# Patient Record
Sex: Male | Born: 1940 | Race: White | Hispanic: No | Smoking: Former smoker
Health system: Southern US, Community
[De-identification: ages and names within clinical notes are randomized; demographics above are authoritative.]

## PROBLEM LIST (undated history)

## (undated) ENCOUNTER — Encounter

## (undated) ENCOUNTER — Telehealth

## (undated) ENCOUNTER — Encounter: Attending: Hematology & Oncology | Primary: Hematology & Oncology

## (undated) ENCOUNTER — Ambulatory Visit

## (undated) ENCOUNTER — Telehealth: Attending: Hematology & Oncology | Primary: Hematology & Oncology

## (undated) ENCOUNTER — Ambulatory Visit: Payer: MEDICARE

## (undated) ENCOUNTER — Encounter: Attending: Registered" | Primary: Registered"

## (undated) ENCOUNTER — Ambulatory Visit: Payer: MEDICARE | Attending: Hematology & Oncology | Primary: Hematology & Oncology

## (undated) ENCOUNTER — Encounter: Attending: Adult Health | Primary: Adult Health

## (undated) ENCOUNTER — Ambulatory Visit: Payer: MEDICARE | Attending: Internal Medicine | Primary: Internal Medicine

## (undated) ENCOUNTER — Ambulatory Visit
Payer: MEDICARE | Attending: Student in an Organized Health Care Education/Training Program | Primary: Student in an Organized Health Care Education/Training Program

## (undated) DIAGNOSIS — Z9289 Personal history of other medical treatment: Secondary | ICD-10-CM

## (undated) DIAGNOSIS — K644 Residual hemorrhoidal skin tags: Secondary | ICD-10-CM

## (undated) DIAGNOSIS — E785 Hyperlipidemia, unspecified: Secondary | ICD-10-CM

## (undated) DIAGNOSIS — M4712 Other spondylosis with myelopathy, cervical region: Secondary | ICD-10-CM

## (undated) DIAGNOSIS — I4510 Unspecified right bundle-branch block: Secondary | ICD-10-CM

## (undated) DIAGNOSIS — K5790 Diverticulosis of intestine, part unspecified, without perforation or abscess without bleeding: Secondary | ICD-10-CM

## (undated) DIAGNOSIS — K219 Gastro-esophageal reflux disease without esophagitis: Secondary | ICD-10-CM

## (undated) DIAGNOSIS — I5030 Unspecified diastolic (congestive) heart failure: Secondary | ICD-10-CM

## (undated) DIAGNOSIS — F4310 Post-traumatic stress disorder, unspecified: Secondary | ICD-10-CM

## (undated) DIAGNOSIS — Z7901 Long term (current) use of anticoagulants: Secondary | ICD-10-CM

## (undated) DIAGNOSIS — R1319 Other dysphagia: Secondary | ICD-10-CM

## (undated) DIAGNOSIS — K56609 Unspecified intestinal obstruction, unspecified as to partial versus complete obstruction: Secondary | ICD-10-CM

## (undated) DIAGNOSIS — G4733 Obstructive sleep apnea (adult) (pediatric): Secondary | ICD-10-CM

## (undated) DIAGNOSIS — K922 Gastrointestinal hemorrhage, unspecified: Secondary | ICD-10-CM

## (undated) DIAGNOSIS — H53001 Unspecified amblyopia, right eye: Secondary | ICD-10-CM

## (undated) DIAGNOSIS — I480 Paroxysmal atrial fibrillation: Secondary | ICD-10-CM

## (undated) DIAGNOSIS — Z87442 Personal history of urinary calculi: Secondary | ICD-10-CM

## (undated) DIAGNOSIS — I10 Essential (primary) hypertension: Secondary | ICD-10-CM

## (undated) DIAGNOSIS — G629 Polyneuropathy, unspecified: Secondary | ICD-10-CM

## (undated) DIAGNOSIS — D494 Neoplasm of unspecified behavior of bladder: Secondary | ICD-10-CM

## (undated) DIAGNOSIS — F419 Anxiety disorder, unspecified: Secondary | ICD-10-CM

## (undated) DIAGNOSIS — C801 Malignant (primary) neoplasm, unspecified: Secondary | ICD-10-CM

## (undated) DIAGNOSIS — Z9989 Dependence on other enabling machines and devices: Secondary | ICD-10-CM

## (undated) DIAGNOSIS — E538 Deficiency of other specified B group vitamins: Secondary | ICD-10-CM

## (undated) DIAGNOSIS — M5136 Other intervertebral disc degeneration, lumbar region: Secondary | ICD-10-CM

## (undated) DIAGNOSIS — M8580 Other specified disorders of bone density and structure, unspecified site: Secondary | ICD-10-CM

## (undated) DIAGNOSIS — S83209A Unspecified tear of unspecified meniscus, current injury, unspecified knee, initial encounter: Secondary | ICD-10-CM

## (undated) DIAGNOSIS — C4440 Unspecified malignant neoplasm of skin of scalp and neck: Secondary | ICD-10-CM

## (undated) DIAGNOSIS — Q283 Other malformations of cerebral vessels: Secondary | ICD-10-CM

## (undated) DIAGNOSIS — Z8719 Personal history of other diseases of the digestive system: Secondary | ICD-10-CM

## (undated) DIAGNOSIS — M1990 Unspecified osteoarthritis, unspecified site: Secondary | ICD-10-CM

## (undated) DIAGNOSIS — M51369 Other intervertebral disc degeneration, lumbar region without mention of lumbar back pain or lower extremity pain: Secondary | ICD-10-CM

## (undated) DIAGNOSIS — I700 Atherosclerosis of aorta: Secondary | ICD-10-CM

## (undated) DIAGNOSIS — F32A Depression, unspecified: Secondary | ICD-10-CM

## (undated) DIAGNOSIS — I4892 Unspecified atrial flutter: Secondary | ICD-10-CM

## (undated) DIAGNOSIS — M419 Scoliosis, unspecified: Secondary | ICD-10-CM

## (undated) DIAGNOSIS — N183 Chronic kidney disease, stage 3 unspecified: Secondary | ICD-10-CM

## (undated) HISTORY — DX: Unspecified tear of unspecified meniscus, current injury, unspecified knee, initial encounter: S83.209A

## (undated) HISTORY — DX: Gastrointestinal hemorrhage, unspecified: K92.2

## (undated) HISTORY — DX: Dependence on other enabling machines and devices: Z99.89

## (undated) HISTORY — DX: Unspecified osteoarthritis, unspecified site: M19.90

## (undated) HISTORY — DX: Personal history of other medical treatment: Z92.89

## (undated) HISTORY — DX: Unspecified atrial flutter: I48.92

## (undated) HISTORY — DX: Malignant (primary) neoplasm, unspecified: C80.1

## (undated) HISTORY — DX: Paroxysmal atrial fibrillation: I48.0

## (undated) HISTORY — DX: Obstructive sleep apnea (adult) (pediatric): G47.33

## (undated) HISTORY — DX: Atherosclerosis of aorta: I70.0

## (undated) HISTORY — DX: Polyneuropathy, unspecified: G62.9

## (undated) HISTORY — DX: Hyperlipidemia, unspecified: E78.5

## (undated) HISTORY — PX: MOHS SURGERY: SUR867

---
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
## 1964-05-15 HISTORY — PX: SKIN GRAFT FULL THICKNESS ARM: SUR1297

## 1998-05-15 HISTORY — PX: LASIK: SHX215

## 2006-05-15 HISTORY — PX: MENISCUS REPAIR: SHX5179

## 2007-01-28 ENCOUNTER — Ambulatory Visit: Payer: Self-pay | Admitting: General Practice

## 2007-06-20 ENCOUNTER — Ambulatory Visit: Payer: Self-pay | Admitting: General Practice

## 2007-07-05 ENCOUNTER — Ambulatory Visit: Payer: Self-pay | Admitting: General Practice

## 2009-05-15 DIAGNOSIS — C61 Malignant neoplasm of prostate: Secondary | ICD-10-CM

## 2009-05-15 DIAGNOSIS — C801 Malignant (primary) neoplasm, unspecified: Secondary | ICD-10-CM

## 2009-05-15 HISTORY — DX: Malignant neoplasm of prostate: C61

## 2009-05-15 HISTORY — DX: Malignant (primary) neoplasm, unspecified: C80.1

## 2009-11-16 ENCOUNTER — Ambulatory Visit: Payer: Self-pay | Admitting: Urology

## 2010-09-12 ENCOUNTER — Ambulatory Visit: Payer: Self-pay | Admitting: Gastroenterology

## 2010-09-12 HISTORY — PX: COLONOSCOPY WITH ESOPHAGOGASTRODUODENOSCOPY (EGD): SHX5779

## 2010-12-30 ENCOUNTER — Ambulatory Visit: Payer: Self-pay | Admitting: Internal Medicine

## 2011-01-10 ENCOUNTER — Ambulatory Visit: Payer: Self-pay | Admitting: Internal Medicine

## 2011-09-21 ENCOUNTER — Inpatient Hospital Stay: Payer: Self-pay | Admitting: Internal Medicine

## 2011-09-21 LAB — PROTIME-INR
INR: 0.9
Prothrombin Time: 12.7 secs (ref 11.5–14.7)

## 2011-09-21 LAB — APTT: Activated PTT: 29.8 secs (ref 23.6–35.9)

## 2011-09-22 LAB — BASIC METABOLIC PANEL
Chloride: 109 mmol/L — ABNORMAL HIGH (ref 98–107)
Co2: 28 mmol/L (ref 21–32)
Creatinine: 0.65 mg/dL (ref 0.60–1.30)
EGFR (African American): >60
EGFR (Non-African Amer.): >60
Glucose: 94 mg/dL (ref 65–99)
Osmolality: 283 (ref 275–301)
Potassium: 3.9 mmol/L (ref 3.5–5.1)
Sodium: 143 mmol/L (ref 136–145)

## 2011-09-22 LAB — CBC WITH DIFFERENTIAL/PLATELET
Basophil #: 0 10*3/uL (ref 0.0–0.1)
Basophil %: 0.9 %
HCT: 29 % — ABNORMAL LOW (ref 40.0–52.0)
Lymphocyte %: 30.8 %
Neutrophil #: 2 10*3/uL (ref 1.4–6.5)
Platelet: 166 10*3/uL (ref 150–440)
RDW: 13.1 % (ref 11.5–14.5)

## 2011-09-23 LAB — CBC WITH DIFFERENTIAL/PLATELET
Basophil #: 0 10*3/uL (ref 0.0–0.1)
Eosinophil %: 4.3 %
HCT: 30.3 % — ABNORMAL LOW (ref 40.0–52.0)
Lymphocyte #: 1.2 10*3/uL (ref 1.0–3.6)
Lymphocyte %: 38 %
MCH: 33.5 pg (ref 26.0–34.0)
MCHC: 34 g/dL (ref 32.0–36.0)
Neutrophil #: 1.5 10*3/uL (ref 1.4–6.5)
Neutrophil %: 47.4 %
Platelet: 186 10*3/uL (ref 150–440)
RBC: 3.08 10*6/uL — ABNORMAL LOW (ref 4.40–5.90)
WBC: 3.2 10*3/uL — ABNORMAL LOW (ref 3.8–10.6)

## 2011-09-24 LAB — HEMOGLOBIN: HGB: 10.2 g/dL — ABNORMAL LOW (ref 13.0–18.0)

## 2012-01-22 ENCOUNTER — Ambulatory Visit: Payer: Self-pay | Admitting: Gastroenterology

## 2012-02-06 DIAGNOSIS — H2510 Age-related nuclear cataract, unspecified eye: Secondary | ICD-10-CM | POA: Insufficient documentation

## 2012-02-06 DIAGNOSIS — H53001 Unspecified amblyopia, right eye: Secondary | ICD-10-CM | POA: Insufficient documentation

## 2012-02-19 DIAGNOSIS — N529 Male erectile dysfunction, unspecified: Secondary | ICD-10-CM | POA: Insufficient documentation

## 2012-03-12 DIAGNOSIS — H5213 Myopia, bilateral: Secondary | ICD-10-CM | POA: Insufficient documentation

## 2012-03-12 DIAGNOSIS — H5021 Vertical strabismus, right eye: Secondary | ICD-10-CM | POA: Insufficient documentation

## 2012-03-12 DIAGNOSIS — H5000 Unspecified esotropia: Secondary | ICD-10-CM | POA: Insufficient documentation

## 2012-06-11 ENCOUNTER — Ambulatory Visit: Payer: Self-pay | Admitting: Internal Medicine

## 2013-05-15 HISTORY — PX: VASCULAR SURGERY: SHX849

## 2013-06-05 LAB — COMPREHENSIVE METABOLIC PANEL
ALK PHOS: 87 U/L
Albumin: 4 g/dL (ref 3.4–5.0)
Anion Gap: 3 — ABNORMAL LOW (ref 7–16)
BILIRUBIN TOTAL: 0.7 mg/dL (ref 0.2–1.0)
BUN: 18 mg/dL (ref 7–18)
CO2: 29 mmol/L (ref 21–32)
CREATININE: 0.77 mg/dL (ref 0.60–1.30)
Calcium, Total: 9.1 mg/dL (ref 8.5–10.1)
Chloride: 102 mmol/L (ref 98–107)
EGFR (African American): >60
EGFR (Non-African Amer.): >60
Glucose: 97 mg/dL (ref 65–99)
Osmolality: 270 (ref 275–301)
Potassium: 4 mmol/L (ref 3.5–5.1)
SGOT(AST): 27 U/L (ref 15–37)
SGPT (ALT): 19 U/L (ref 12–78)
Sodium: 134 mmol/L — ABNORMAL LOW (ref 136–145)
TOTAL PROTEIN: 7.5 g/dL (ref 6.4–8.2)

## 2013-06-05 LAB — CBC WITH DIFFERENTIAL/PLATELET
BASOS ABS: 0 10*3/uL (ref 0.0–0.1)
BASOS PCT: 0.5 %
EOS PCT: 1.1 %
Eosinophil #: 0.1 10*3/uL (ref 0.0–0.7)
HCT: 39.3 % — ABNORMAL LOW (ref 40.0–52.0)
HGB: 13.3 g/dL (ref 13.0–18.0)
LYMPHS PCT: 23.3 %
Lymphocyte #: 1.2 10*3/uL (ref 1.0–3.6)
MCH: 32.8 pg (ref 26.0–34.0)
MCHC: 33.8 g/dL (ref 32.0–36.0)
MCV: 97 fL (ref 80–100)
Monocyte #: 0.4 x10 3/mm (ref 0.2–1.0)
Monocyte %: 8.3 %
Neutrophil #: 3.5 10*3/uL (ref 1.4–6.5)
Neutrophil %: 66.8 %
Platelet: 205 10*3/uL (ref 150–440)
RBC: 4.04 10*6/uL — AB (ref 4.40–5.90)
RDW: 12.7 % (ref 11.5–14.5)
WBC: 5.2 10*3/uL (ref 3.8–10.6)

## 2013-06-05 LAB — CBC
HCT: 40.2 % (ref 40.0–52.0)
HGB: 13.6 g/dL (ref 13.0–18.0)
MCH: 32.8 pg (ref 26.0–34.0)
MCHC: 33.8 g/dL (ref 32.0–36.0)
MCV: 97 fL (ref 80–100)
PLATELETS: 221 10*3/uL (ref 150–440)
RBC: 4.15 10*6/uL — AB (ref 4.40–5.90)
RDW: 12.9 % (ref 11.5–14.5)
WBC: 6.7 10*3/uL (ref 3.8–10.6)

## 2013-06-06 ENCOUNTER — Inpatient Hospital Stay: Payer: Self-pay | Admitting: Internal Medicine

## 2013-06-06 LAB — HEMATOCRIT
HCT: 31.9 % — AB (ref 40.0–52.0)
HCT: 29.4 % — ABNORMAL LOW (ref 40.0–52.0)
HCT: 26.7 % — AB (ref 40.0–52.0)

## 2013-06-06 LAB — HEMOGLOBIN
HGB: 10.9 g/dL — ABNORMAL LOW (ref 13.0–18.0)
HGB: 9.9 g/dL — AB (ref 13.0–18.0)
HGB: 9 g/dL — ABNORMAL LOW (ref 13.0–18.0)

## 2013-06-07 LAB — BASIC METABOLIC PANEL
ANION GAP: 6 — AB (ref 7–16)
BUN: 13 mg/dL (ref 7–18)
CHLORIDE: 106 mmol/L (ref 98–107)
CREATININE: 0.75 mg/dL (ref 0.60–1.30)
Calcium, Total: 8 mg/dL — ABNORMAL LOW (ref 8.5–10.1)
Co2: 24 mmol/L (ref 21–32)
EGFR (African American): >60
Glucose: 112 mg/dL — ABNORMAL HIGH (ref 65–99)
OSMOLALITY: 273 (ref 275–301)
Potassium: 3.7 mmol/L (ref 3.5–5.1)
Sodium: 136 mmol/L (ref 136–145)

## 2013-06-07 LAB — CBC WITH DIFFERENTIAL/PLATELET
BASOS ABS: 0 10*3/uL (ref 0.0–0.1)
Basophil %: 0.4 %
EOS PCT: 0.2 %
Eosinophil #: 0 10*3/uL (ref 0.0–0.7)
HCT: 24.9 % — AB (ref 40.0–52.0)
HGB: 8.6 g/dL — ABNORMAL LOW (ref 13.0–18.0)
Lymphocyte #: 1.3 10*3/uL (ref 1.0–3.6)
Lymphocyte %: 16.4 %
MCH: 33.6 pg (ref 26.0–34.0)
MCHC: 34.7 g/dL (ref 32.0–36.0)
MCV: 97 fL (ref 80–100)
Monocyte #: 0.6 x10 3/mm (ref 0.2–1.0)
Monocyte %: 7.8 %
NEUTROS ABS: 5.8 10*3/uL (ref 1.4–6.5)
NEUTROS PCT: 75.2 %
Platelet: 192 10*3/uL (ref 150–440)
RBC: 2.57 10*6/uL — ABNORMAL LOW (ref 4.40–5.90)
RDW: 12.7 % (ref 11.5–14.5)
WBC: 7.7 10*3/uL (ref 3.8–10.6)

## 2013-06-07 LAB — HEMOGLOBIN: HGB: 8 g/dL — ABNORMAL LOW (ref 13.0–18.0)

## 2013-06-07 LAB — MAGNESIUM: Magnesium: 1.6 mg/dL — ABNORMAL LOW

## 2013-06-07 LAB — HEMATOCRIT: HCT: 23.3 % — AB (ref 40.0–52.0)

## 2013-06-08 LAB — BASIC METABOLIC PANEL
ANION GAP: 5 — AB (ref 7–16)
BUN: 11 mg/dL (ref 7–18)
Calcium, Total: 8 mg/dL — ABNORMAL LOW (ref 8.5–10.1)
Chloride: 108 mmol/L — ABNORMAL HIGH (ref 98–107)
Co2: 26 mmol/L (ref 21–32)
Creatinine: 0.73 mg/dL (ref 0.60–1.30)
EGFR (African American): >60
EGFR (Non-African Amer.): >60
Glucose: 97 mg/dL (ref 65–99)
OSMOLALITY: 277 (ref 275–301)
Potassium: 3.7 mmol/L (ref 3.5–5.1)
SODIUM: 139 mmol/L (ref 136–145)

## 2013-06-08 LAB — CBC WITH DIFFERENTIAL/PLATELET
BASOS PCT: 0.4 %
Basophil #: 0 10*3/uL (ref 0.0–0.1)
EOS ABS: 0.1 10*3/uL (ref 0.0–0.7)
Eosinophil %: 1.3 %
HCT: 20.9 % — ABNORMAL LOW (ref 40.0–52.0)
HGB: 7.1 g/dL — AB (ref 13.0–18.0)
Lymphocyte #: 1.3 10*3/uL (ref 1.0–3.6)
Lymphocyte %: 16.5 %
MCH: 32.8 pg (ref 26.0–34.0)
MCHC: 34.1 g/dL (ref 32.0–36.0)
MCV: 96 fL (ref 80–100)
MONOS PCT: 9.2 %
Monocyte #: 0.7 x10 3/mm (ref 0.2–1.0)
Neutrophil #: 5.6 10*3/uL (ref 1.4–6.5)
Neutrophil %: 72.6 %
PLATELETS: 183 10*3/uL (ref 150–440)
RBC: 2.17 10*6/uL — AB (ref 4.40–5.90)
RDW: 12.7 % (ref 11.5–14.5)
WBC: 7.7 10*3/uL (ref 3.8–10.6)

## 2013-06-09 LAB — CBC WITH DIFFERENTIAL/PLATELET
BASOS ABS: 0.1 10*3/uL (ref 0.0–0.1)
BASOS PCT: 0.5 %
EOS ABS: 0.1 10*3/uL (ref 0.0–0.7)
EOS PCT: 1.4 %
HCT: 23.9 % — ABNORMAL LOW (ref 40.0–52.0)
HGB: 8.2 g/dL — AB (ref 13.0–18.0)
LYMPHS PCT: 12.7 %
Lymphocyte #: 1.3 10*3/uL (ref 1.0–3.6)
MCH: 33.4 pg (ref 26.0–34.0)
MCHC: 34.2 g/dL (ref 32.0–36.0)
MCV: 98 fL (ref 80–100)
MONO ABS: 0.8 x10 3/mm (ref 0.2–1.0)
MONOS PCT: 7.8 %
Neutrophil #: 8.1 10*3/uL — ABNORMAL HIGH (ref 1.4–6.5)
Neutrophil %: 77.6 %
Platelet: 232 10*3/uL (ref 150–440)
RBC: 2.44 10*6/uL — AB (ref 4.40–5.90)
RDW: 12.6 % (ref 11.5–14.5)
WBC: 10.4 10*3/uL (ref 3.8–10.6)

## 2013-06-10 LAB — HEMOGLOBIN
HGB: 7 g/dL — ABNORMAL LOW (ref 13.0–18.0)
HGB: 8.1 g/dL — AB (ref 13.0–18.0)

## 2014-01-20 DIAGNOSIS — K219 Gastro-esophageal reflux disease without esophagitis: Secondary | ICD-10-CM | POA: Insufficient documentation

## 2014-07-13 ENCOUNTER — Encounter: Payer: Self-pay | Admitting: *Deleted

## 2014-07-20 ENCOUNTER — Ambulatory Visit (INDEPENDENT_AMBULATORY_CARE_PROVIDER_SITE_OTHER): Payer: Medicare Other | Admitting: General Surgery

## 2014-07-20 ENCOUNTER — Encounter: Payer: Self-pay | Admitting: General Surgery

## 2014-07-20 VITALS — BP 140/80 | HR 78 | Resp 14 | Ht 72.0 in | Wt 181.0 lb

## 2014-07-20 DIAGNOSIS — K4090 Unilateral inguinal hernia, without obstruction or gangrene, not specified as recurrent: Secondary | ICD-10-CM | POA: Diagnosis not present

## 2014-07-20 NOTE — Progress Notes (Signed)
Patient ID: Ryan Allison, male   DOB: Jan 07, 1941, 74 y.o.   MRN: [id number]  Chief Complaint  Patient presents with  . Other    evaluation of enlarging left inguinal hernia    HPI Ryan Allison is a 74 y.o. male who presents for an evaluation of an enlarging left inguinal hernia. The patient states he noticed the hernia approximately 25- 30 years. He states it has given him problems off and on, but in January 2016 he started having significant pain and a bulge. The hernia was bigger and very sore. He rested for few hours and the hernia reduced.  He has had this only once. He states the area was sensitive to the touch.  He had an abdominal/pelvis CT scan on 06/06/13.   HPI  Past Medical History  Diagnosis Date  . Intestinal bleeding   . Meniscus tear   . Arthritis   . Prostate enlargement   . Cancer 2011    prostate    Past Surgical History  Procedure Laterality Date  . Meniscus repair  2008  . Vascular surgery  2015    Family History  Problem Relation Age of Onset  . Cancer Father   . Cancer Brother     Social History History  Substance Use Topics  . Smoking status: Former Research scientist (life sciences)  . Smokeless tobacco: Never Used  . Alcohol Use: No    Allergies  Allergen Reactions  . Celebrex [Celecoxib] Itching and Rash    Current Outpatient Prescriptions  Medication Sig Dispense Refill  . gabapentin (NEURONTIN) 600 MG tablet Take 600 mg by mouth 4 (four) times daily.    Marland Kitchen omeprazole (PRILOSEC) 40 MG capsule Take 40 mg by mouth daily.    . simvastatin (ZOCOR) 20 MG tablet Take 20 mg by mouth daily.    . tamsulosin (FLOMAX) 0.4 MG CAPS capsule Take 0.4 mg by mouth 2 (two) times daily.    . traMADol (ULTRAM) 50 MG tablet Take 50 mg by mouth every 6 (six) hours as needed.     No current facility-administered medications for this visit.    Review of Systems Review of Systems  Constitutional: Negative.   Respiratory: Negative.   Cardiovascular: Negative.    Gastrointestinal: Negative.     Blood pressure 140/80, pulse 78, resp. rate 14, height 6' (1.829 m), weight 181 lb (82.101 kg).  Physical Exam Physical Exam  Constitutional: He is oriented to person, place, and time. He appears well-developed and well-nourished.  Eyes: Conjunctivae are normal. No scleral icterus.  Neck: Neck supple. No thyromegaly present.  Cardiovascular: Normal rate, regular rhythm and normal heart sounds.   No murmur heard. Pulmonary/Chest: Effort normal and breath sounds normal.  Abdominal: Soft. Normal appearance and bowel sounds are normal. There is no hepatosplenomegaly. There is no tenderness. A hernia is present. Hernia confirmed positive in the left inguinal area (small to medium reducible hernia).  Lymphadenopathy:    He has no cervical adenopathy.  Neurological: He is alert and oriented to person, place, and time.  Skin: Skin is warm and dry.    Data Reviewed Prior CT abd/pelvis  From 1 yr ago. Labs from December,2015  Assessment    Left inguinal hernia. Symptomatic with one episode recently of incarceration.     Plan   Discussed hernia repair in full, risk of incarceration, procedures and risks and benefits. Patient has an upcoming trip to [place] planned. This patient was instructed to call the office when he wishes to arrange left  inguinal hernia repair.  Advised on what to do if he has another episode of incarceration.       Ryan Allison 07/20/2014, 11:01 AM

## 2014-07-20 NOTE — Patient Instructions (Addendum)
Open Hernia Repair Open hernia repair is surgery to fix a hernia. A hernia occurs when an internal organ or tissue pushes out through a weak spot in the abdominal wall muscles. Hernias commonly occur in the groin and around the navel. Most hernias tend to get worse over time. Surgery is often done to prevent the hernia from getting bigger, becoming uncomfortable, or becoming an emergency. Emergency surgery may be needed if abdominal contents get stuck in the opening (incarcerated hernia) or the blood supply gets cut off (strangulated hernia). In an open repair, a large cut (incision) is made in the abdomen to perform the surgery. LET [facility] CARE PROVIDER KNOW ABOUT:  Any allergies you have.  All medicines you are taking, including vitamins, herbs, eye drops, creams, and over-the-counter medicines.  Previous problems you or members of your family have had with the use of anesthetics.  Any blood disorders you have.  Previous surgeries you have had.  Medical conditions you have. RISKS AND COMPLICATIONS Generally, this is a safe procedure. However, as with any procedure, complications can occur. Possible complications include:  Infection.  Bleeding.  Nerve injury.  Chronic pain.  The hernia can come back.  Injury to the intestines. BEFORE THE PROCEDURE  Ask your health care provider about changing or stopping any regular medicines. Avoid taking aspirin or blood thinners as directed by your health care provider.  Do noteat or drink anything after midnight the night before surgery.  If you smoke, do not smoke for at least 2 weeks before your surgery.  Do not drink alcohol the day before your surgery.  Let your health care provider know if you develop a cold or any infection before your surgery.  Arrange for someone to drive you home after the procedure or after your hospital stay. Also arrange for someone to help you with activities during recovery. PROCEDURE   Small  monitors will be put on your body. They are used to check your heart, blood pressure, and oxygen level.   An IV access tube will be put into one of your veins. Medicine will be able to flow directly into your body through this IV tube.   You might be given a medicine to help you relax (sedative).   You will be given a medicine to make you sleep (general anesthetic). A breathing tube may be placed into your lungs during the procedure.  A cut (incision) is made over the hernia defect, and the contents are pushed back into the abdomen.  If the hernia is small, stitches may be used to bring the muscle edges back together.  Typically, a surgeon will place a mesh patch made of man-made material (synthetic) to cover the defect. The mesh is sewn to healthy muscle. This reduces the risk of the hernia coming back.  The tissue and skin over the hernia are then closed with stitches or staples.  If the hernia was large, a drain may be left in place to collect excess fluid where the hernia used to be.  Bandages (dressings) are used to cover the incision. AFTER THE PROCEDURE  You will be taken to a recovery area where your progress will be monitored.  If the hernia was small or in the groin (inguinal) region, you will likely be allowed to go home once you are awake, stable, and taking fluids well.  If the hernia was large, you may have to wait for your bowel function to return. You may need to stay in the hospital  for 2-3 days until you can eat and your pain is controlled. A drain may be left in place for 5-7 days. You will be taught how to care for the drain. Document Released: 10/25/2000 Document Revised: 02/19/2013 Document Reviewed: 12/11/2012 [facility] Patient Information 2015 [place], [place]. This information is not intended to replace advice given to you by your health care provider. Make sure you discuss any questions you have with your health care provider.  This patient was instructed to  call the office when he wishes to arrange surgery.

## 2014-09-03 ENCOUNTER — Telehealth: Payer: Self-pay

## 2014-09-03 NOTE — Telephone Encounter (Signed)
Patient called to schedule his inguinal hernia surgery. Patient is scheduled for surgery at [facility] on 09/23/14. He will pre admit at the hospital. He will have a pre op visit here with Dr Jamal Collin on 09/14/14 at 1:45 pm. Patient is aware of dates, time, and instructions.

## 2014-09-05 NOTE — Consult Note (Signed)
PATIENT NAME:  Ryan Allison, Ryan Allison MR#:  [id number] DATE OF BIRTH:  1940/12/21  DATE OF CONSULTATION:  06/06/2013  CONSULTING PHYSICIAN:  Payton Emerald, NP  Addendum. This is a continuation.  IMPRESSION: Lower gastrointestinal bleed, diverticular in nature, positive blood loss study, hypotension and positive orthostatic vital signs, anemia, personal history of depression, anxiety, personal history of prostate cancer status post radiation therapy.   PLAN: The patient's presentation was discussed with Dr. Verdie Shire, who also saw the patient. He has spoken with Dr. Candiss Norse about the patient being transferred to ICU. Order has been placed. STAT hemoglobin and hematocrit ordered. Type and cross for 2 units of blood, transfuse as necessary. Consultation has already been placed for vascular surgery to see the patient for consideration of embolization. N.p.o. status. Will continue to closely monitor the patient during his hospitalization.   These services provided by Payton Emerald, MS, APRN, [facility], FNP, under collaborative agreement with Dr. Verdie Shire.  ____________________________ Payton Emerald, NP dsh:lb D: 06/06/2013 11:47:39 ET T: 06/06/2013 12:14:17 ET JOB#: [id number]  cc: Payton Emerald, NP, <Dictator> Payton Emerald MD ELECTRONICALLY SIGNED 06/06/2013 16:51

## 2014-09-05 NOTE — Consult Note (Signed)
Brief Consult Note: Diagnosis: Lower GI bleed.  Diverticular in nature.  Positive blood loss study. Hypotension. Positive orthrostatic VS.  Anemia.  History of depression and anxiety.  History of prostate cancer s/p radiation therapy.   Consult note dictated.   Discussed with Attending MD.   Comments: Patient's presentation discussed with Dr. Verdie Shire who spoke with Dr. Candiss Norse about patient being transferred to ICU. STAT Hemoglobin and Hematocrit ordered.  Type and Cross for two units of blood.  Consult has been placed for vasscular surgery.  NPO status.  Electronic Signatures: Payton Emerald (NP)  (Signed 23-Jan-15 11:47)  Authored: Brief Consult Note   Last Updated: 23-Jan-15 11:47 by Payton Emerald (NP)

## 2014-09-05 NOTE — Consult Note (Signed)
Brief Consult Note: Diagnosis: GI Bleed.   Recommend to proceed with surgery or procedure.   Comments: Patient continues to have bleeding and he had a strongly + nuc scan  Recommend embolization of the hepatic flexure  risks and benefits discussed with the patient, all questions answered.  He agrees to proceed with angiography and intervention.  Electronic Signatures: Hortencia Pilar (MD)  (Signed 23-Jan-15 15:34)  Authored: Brief Consult Note   Last Updated: 23-Jan-15 15:34 by Hortencia Pilar (MD)
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
## 2014-09-05 NOTE — Discharge Summary (Signed)
PATIENT NAME:  Ryan Allison, Ryan Allison MR#:  [id number] DATE OF BIRTH:  [birth year]  DATE OF ADMISSION:  06/06/2013 DATE OF DISCHARGE:  06/10/2013  FINAL DIAGNOSES: 1. Diverticular lower gastrointestinal bleeding.  2. Acute blood loss anemia secondary to #1.  3. Hypertension.  4. Peripheral neuropathy.   PROCEDURE: Status post arteriogram with embolization.   HISTORY AND PHYSICAL: Please see dictated admission history and physical.    HOSPITAL COURSE: The patient was admitted with significant gastrointestinal bleeding, with acceleration of the bleeding and drop in blood pressure which required transfer to the Intensive Care Unit and required fluid resuscitation. He was evaluated by GI and subsequently by vascular surgery. He underwent arteriogram with embolization of an area within the right colon which was noted to be positive by bleeding scan. This procedure was immediately noted to be successful by lack of blush on the arteriogram. His hemoglobin settled down around _____ . He was monitored and had a bowel movement which did not show bright red blood. 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 He is tolerating a diet, felt ready to go home and so will be discharged home in stable condition with his physical activity to be up as tolerated. He will follow a soft diet for 4 to 5 days, then a low sodium diet. He will follow up in our office in the next 1 to 2 weeks and follow up with Dr. Gustavo Lah in 2 to 4 weeks. Plans were set up to have him have a CBC done on 06/11/2013.   DISCHARGE MEDICATIONS: 1. Simvastatin 20 mg p.o. at bedtime.  2. Omeprazole 20 mg p.o. daily.  3. Neurontin 600 mg p.o. 4 times a day.  4. Flomax 0.4 mg p.o. at bedtime.  5. Ambien 10 mg p.o. at bedtime as needed for sleep.  6. Ferrous sulfate 325 mg p.o. daily with food.   7. Colace 100 mg p.o. b.i.d.  8. MiraLAX 17 grams p.o. daily as needed for constipation.  9. Tramadol 50 mg p.o. 4 times a day as needed for pain.   The patient was given instructions to avoid aspirin, ibuprofen or other anti-inflammatories. He may use Tylenol as needed.    ____________________________ Adin Hector, MD bjk:sg D: 06/10/2013 14:08:03 ET T: 06/10/2013 14:13:44 ET JOB#: [id number]  cc: Adin Hector, MD, <Dictator> Ramonita Lab MD ELECTRONICALLY SIGNED 06/11/2013 6:43

## 2014-09-05 NOTE — Consult Note (Signed)
Pt seen and examined. Plase see Ryan Allison's notes. Rectal bleeding since Wed. Multiple bouts of bleeding so far today. Pt hypotensive, orthostatic, and pale. CT shows diffuse tics. Bleeding scan positve near hepatic flexure. Stat Hgb ordered. Blood T+C for possible transfusions. IV hydration. Urgent consult for vascular surgery. Pt to be transferred to ICU today for closer monitering. Thanks.  Electronic Signatures: Verdie Shire (MD)  (Signed on 23-Jan-15 12:02)  Authored  Last Updated: 23-Jan-15 12:02 by Verdie Shire (MD)

## 2014-09-05 NOTE — Op Note (Signed)
PATIENT NAME:  Ryan Allison, Ryan Allison MR#:  [id number] DATE OF BIRTH:  [birth year]  DATE OF PROCEDURE:  06/06/2013  PREOPERATIVE DIAGNOSIS: Gastrointestinal bleed.   POSTOPERATIVE DIAGNOSIS: Gastrointestinal bleed.   PROCEDURES PERFORMED:  1.  Abdominal aortogram.  2.  Selective injection of the middle colic artery.  3.  Selective injection of a branch of the right colic artery representing additional third order catheter placement.  4.  Bead embolization of the middle colic artery.  5.  Embolization of the branch of the right colic artery.   SURGEON: Katha Cabal, M.D.   SEDATION: Versed 5 mg plus fentanyl 200 mcg administered IV. Continuous ECG, pulse oximetry and cardiopulmonary monitoring were performed throughout the entire procedure by the interventional radiology nurse. Total sedation time is 1 hour.   ACCESS: A 5-French sheath, right common femoral artery.   FLUOROSCOPY TIME: Approximately 10.5 minutes.   CONTRAST USED: Isovue, approximately 65 mL.   INDICATIONS: Ryan Allison is a 74 year old gentleman who presented with a lower GI bleed. He has continued to bleed and has had a drop in his hemoglobin by 5 g. Nuclear study demonstrated intense nucleotide at the hepatic flexure. Risks and benefits for embolization were reviewed. Alternative therapies were discussed. All questions were answered. The patient agrees to proceed.   DESCRIPTION OF PROCEDURE: The patient is taken to special procedures and placed in the supine position. After adequate sedation is achieved, both groins are prepped and draped in sterile fashion. Ultrasound is placed in a sterile sleeve. The common femoral artery is identified. It is echolucent and pulsatile indicating patency. Under direct ultrasound visualization, lidocaine is infiltrated into the soft tissues surrounding the artery as well as the skin. Image is recorded for the permanent record and a micropuncture needle is used to access the vein under direct  visualization. Microwire followed by microsheath, J-wire followed by a 5-French sheath and 5-French pigtail catheter.   AP projection of the aorta is obtained and subsequently a lateral view is obtained after positioning the pigtail catheter just above the visceral vessels. Lateral imaging demonstrates the origin of the SMA which is widely patent and an Ansell 5-French sheath is advanced over the stiff-angled Glidewire. The stiff-angled Glidewire was used to remove the pigtail catheter and subsequently the 5-French sheath. Then a VS-1 catheter is advanced, re-formed in the descending aorta, and used to engage the SMA. The Ansell catheter is then advanced up over the pigtail catheter and it is seated so that the tip is approximately 2 cm into the SMA. A KMP catheter was exchanged over the Glidewire, and using the Glidewire and the KMP catheter, the  middle colic is engaged. The catheter is advanced out into the middle colic past the first branch which coursed toward the transverse colon.   A second branch originating more distally from the right colic was selected, the branch that goes directly to the hepatic flexure. Hand injection does demonstrate a small blush and subsequently 1 mL of 300 to 500 Micron PVC beads is instilled. Follow-up angiography demonstrates the blush is markedly diminished but there still appears to be an area that being fed by a branch emanating from the right colic and therefore the KMP catheter is pulled back into the SMA. Glidewire is negotiated down, the right colic is engaged and subsequently the branch super selected. The catheter is advanced into the branch representing additional third order catheter placement and another 0.5 mL of beads is placed.   Follow-up imaging demonstrates that the blush now appears  to be completely absent. The IV sheath is then pulled over the wire back into the right external iliac and the oblique view of the groin is obtained and a StarClose device was  deployed. There were no immediate complications.   INTERPRETATION: The SMA appears widely patent. On super selection of both the right colic branch as well as the middle colic more distally, there is a blush noted in the hepatic flexure. A total volume of 56mL of beads is injected and the  blush appears to be absent following these maneuvers.   SUMMARY: A successful embolization of the hepatic flexure as described above.    ____________________________ Katha Cabal, MD ggs:np D: 06/06/2013 19:26:43 ET T: 06/06/2013 21:33:08 ET JOB#: [id number]  cc: Katha Cabal, MD, <Dictator> Adin Hector, MD Lupita Dawn. Candace Cruise, MD Katha Cabal MD ELECTRONICALLY SIGNED 06/25/2013 13:27

## 2014-09-05 NOTE — Consult Note (Signed)
No signs of active bleeding. However, hgb fell from yest. Agree with rechecking hgb. If hgb continues to fall, then will need transfusion and will need to consider surgery.   Electronic Signatures: Verdie Shire (MD)  (Signed on 27-Jan-15 09:45)  Authored  Last Updated: 27-Jan-15 09:45 by Verdie Shire (MD)

## 2014-09-05 NOTE — Consult Note (Signed)
Chief Complaint:  Subjective/Chief Complaint Though hgb has dropped to 7.1, patient insists no further bleeding since embolectomy. No BM either. Tired to frequent blood draws. Wants to go home.   VITAL SIGNS/ANCILLARY NOTES: **Vital Signs.:   25-Jan-15 09:29  Telemetry pattern Cardiac Rhythm Normal sinus rhythm   Brief Assessment:  GEN no acute distress   Cardiac Regular   Respiratory clear BS   Gastrointestinal Normal   Lab Results: Routine Chem:  25-Jan-15 04:42   Glucose, Serum 97  BUN 11  Creatinine (comp) 0.73  Sodium, Serum 139  Potassium, Serum 3.7  Chloride, Serum  108  CO2, Serum 26  Calcium (Total), Serum  8.0  Anion Gap  5  Osmolality (calc) 277  eGFR (African American) >60  eGFR (Non-African American) >60 (eGFR values <65m/min/1.73 m2 may be an indication of chronic kidney disease (CKD). Calculated eGFR is useful in patients with stable renal function. The eGFR calculation will not be reliable in acutely ill patients when serum creatinine is changing rapidly. It is not useful in  patients on dialysis. The eGFR calculation may not be applicable to patients at the low and high extremes of body sizes, pregnant women, and vegetarians.)  Routine Hem:  25-Jan-15 04:42   WBC (CBC) 7.7  RBC (CBC)  2.17  Hemoglobin (CBC)  7.1  Hematocrit (CBC)  20.9  Platelet Count (CBC) 183  MCV 96  MCH 32.8  MCHC 34.1  RDW 12.7  Neutrophil % 72.6  Lymphocyte % 16.5  Monocyte % 9.2  Eosinophil % 1.3  Basophil % 0.4  Neutrophil # 5.6  Lymphocyte # 1.3  Monocyte # 0.7  Eosinophil # 0.1  Basophil # 0.0 (Result(s) reported on 08 Jun 2013 at 05:42AM.)   Assessment/Plan:  Assessment/Plan:  Assessment LGI bleeding. Stopped post embolectomy.   Plan Ok to change to blood draws once daily. Try soft diet. If patient tolerates solids without rebleed, THEN ok to go home. May benefit from Fe supplements. Thanks.   Electronic Signatures: OVerdie Shire(MD)  (Signed 25-Jan-15  11:09)  Authored: Chief Complaint, VITAL SIGNS/ANCILLARY NOTES, Brief Assessment, Lab Results, Assessment/Plan   Last Updated: 25-Jan-15 11:09 by OVerdie Shire(MD)

## 2014-09-05 NOTE — Consult Note (Signed)
Chief Complaint:  Subjective/Chief Complaint No further bleeding post embolization. Initiallygroin pain at catheter site. Improving. No abd pain. Hgb stabilizing.   VITAL SIGNS/ANCILLARY NOTES: **Vital Signs.:   24-Jan-15 09:22  Pulse Pulse 68  Respirations Respirations 13  Systolic BP Systolic BP 846  Diastolic BP (mmHg) Diastolic BP (mmHg) 56  Mean BP 74  Pulse Ox % Pulse Ox % 98  Oxygen Delivery Room Air/ 21 %  Pulse Ox Heart Rate 70   Brief Assessment:  GEN no acute distress   Cardiac Regular   Respiratory clear BS   Gastrointestinal Normal   Lab Results: Routine Chem:  24-Jan-15 03:44   Glucose, Serum  112  BUN 13  Creatinine (comp) 0.75  Sodium, Serum 136  Potassium, Serum 3.7  Chloride, Serum 106  CO2, Serum 24  Calcium (Total), Serum  8.0  Anion Gap  6  Osmolality (calc) 273  eGFR (African American) >60  eGFR (Non-African American) >60 (eGFR values <85m/min/1.73 m2 may be an indication of chronic kidney disease (CKD). Calculated eGFR is useful in patients with stable renal function. The eGFR calculation will not be reliable in acutely ill patients when serum creatinine is changing rapidly. It is not useful in  patients on dialysis. The eGFR calculation may not be applicable to patients at the low and high extremes of body sizes, pregnant women, and vegetarians.)  Magnesium, Serum  1.6 (1.8-2.4 THERAPEUTIC RANGE: 4-7 mg/dL TOXIC: > 10 mg/dL  -----------------------)  Routine Hem:  24-Jan-15 03:44   WBC (CBC) 7.7  RBC (CBC)  2.57  Hemoglobin (CBC)  8.6  Hematocrit (CBC)  24.9  Platelet Count (CBC) 192  MCV 97  MCH 33.6  MCHC 34.7  RDW 12.7  Neutrophil % 75.2  Lymphocyte % 16.4  Monocyte % 7.8  Eosinophil % 0.2  Basophil % 0.4  Neutrophil # 5.8  Lymphocyte # 1.3  Monocyte # 0.6  Eosinophil # 0.0  Basophil # 0.0 (Result(s) reported on 07 Jun 2013 at 04:06AM.)   Assessment/Plan:  Assessment/Plan:  Assessment LGI bleeing with positive  bleeding scan. Likely diverticular bleed.   Plan S/p embolization. Stable now. Clear liquid diet ordered. Advance diet gradually as tolerated. Watch for rebleed. Ok for transfer to floor if remains stable. thanks   Electronic Signatures: OVerdie Shire(MD)  (Signed 24-Jan-15 09:26)  Authored: Chief Complaint, VITAL SIGNS/ANCILLARY NOTES, Brief Assessment, Lab Results, Assessment/Plan   Last Updated: 24-Jan-15 09:26 by OVerdie Shire(MD)

## 2014-09-06 NOTE — Consult Note (Signed)
Pt seen and interviewed, discussed with NP.  He has a diverticular bleed which has stopped. Recommend conservative approach and bed rest with serial hgb.  No plans for colonoscopy unless he rebleeds.  Will follow with you.  Electronic Signatures: Manya Silvas (MD)  (Signed on 10-May-13 19:08)  Authored  Last Updated: 10-May-13 19:08 by Manya Silvas (MD)

## 2014-09-06 NOTE — Consult Note (Signed)
Hgb 10.3, no further bleeding or abd pain.  Keep on clear liquids til tomorrow then try full liquids, could go home tomorrow afternoon/evening if all goes well and I will follow up in the office. Avoid seeds, nuts, popcorn.   Electronic Signatures: Manya Silvas (MD)  (Signed on 11-May-13 11:32)  Authored  Last Updated: 11-May-13 11:32 by Manya Silvas (MD)

## 2014-09-06 NOTE — Discharge Summary (Signed)
PATIENT NAME:  Ryan Allison, Ryan Allison MR#:  [id number] DATE OF BIRTH:  1940-08-22  DATE OF ADMISSION:  09/21/2011 DATE OF DISCHARGE:  09/24/2011  FINAL DIAGNOSES:  1. Acute gastrointestinal bleeding secondary to diverticula.  2. Acute blood loss anemia secondary to #1.  3. Hyperlipidemia.  4. Peripheral neuropathy.  5. Degenerative disk disease.  6. History of prostate cancer. 7. B12 deficiency.  8. Sleep apnea.   HISTORY AND PHYSICAL: Please see dictated admission history and physical.   HOSPITAL COURSE: The patient was admitted after having about a week and half of gastrointestinal bleeding with a drop in his hemoglobin of approximately 3 grams. He was resuscitated with IV fluids as he had been having symptoms with this, and this improved. His hemoglobin dropped below 10, although some of this was felt to be hemodilution. Fortunately, his bleeding appeared to slow and stop. Gastroenterology saw the patient and they recommended serial hemoglobins, which were followed. This began to slowly go upward. His diet was advanced and he tolerated this.  DISPOSITION: At this time the plan is for him to be discharged to home in stable condition with his physical activity up as tolerated. He will follow a low residue diet for one week, then go to a low fat diet. He will follow-up in our office in the next 4 to 5 days. His physical activity should be up as tolerated.   DISCHARGE MEDICATIONS:  1. Simvastatin 20 mg p.o. at bedtime.  2. Omeprazole 20 mg p.o. daily.  3. Gabapentin 600 mg 4 times a day.  4. Flomax 0.4 mg at bedtime.  5. Ambien 10 mg p.o. at bedtime as needed for sleep.  He was given instructions to avoid naproxen, Advil, or aspirin. He may use Tylenol or acetaminophen as needed for pain or fever.     ____________________________ Adin Hector, MD [id number] D: 09/24/2011 12:34:30 ET T: 09/26/2011 12:18:10 ET JOB#: [id number]  cc: Tama High III, MD, <Dictator> Ramonita Lab  MD ELECTRONICALLY SIGNED 09/27/2011 7:30

## 2014-09-06 NOTE — Consult Note (Signed)
PATIENT NAME:  Ryan Allison, Ryan Allison MR#:  [id number] DATE OF BIRTH:  1940-11-10  DATE OF CONSULTATION:  09/21/2011  REFERRING PHYSICIAN:   CONSULTING PHYSICIAN:  Loreli Dollar, MD  HISTORY OF PRESENT ILLNESS: This 74 year old male was admitted emergently to the hospital with a chief complaint of passing blood with his bowel movement. He reports that some seven days ago he saw a small amount of blood when he moved his bowels but then three days ago he felt some vague diffuse periumbilical abdominal discomfort and had some abrupt brisk passage of bright red blood with loose bowel movement. He had additional bowel movements that same day, had a small amount of evidence of bleeding yesterday, and had a somewhat loose bowel movement this morning with a small amount of blood passed. He went in to see Dr. Ramonita Lab in the office today and was admitted to the hospital. It is further noted that he feels somewhat weak and washed out. He is not having any chills or fever. He says his weight has recently been stable. He's been eating well. He's having no nausea or vomiting and had been moving his bowels satisfactorily in the weeks leading up to this and says occasionally he will have a somewhat firm bowel movement He was admitted and placed on IV fluids.   PAST MEDICAL HISTORY:  1. He reports that two times in the past he's had some rectal bleeding. One time he was traveling and had somewhat brisk bleeding but not as much as this time and one other more minor episode of bleeding. Typically he moves his bowels about every few days but does not typically pass blood with his bowel movement.  2. Does have some history of external hemorrhoids. Also was told in the past he had an internal hemorrhoid. 3. History of diverticulosis. I reviewed a colonoscopy 09/12/2010 done by Dr. Gustavo Lah. There were multiple small and large mouth diverticula found in the sigmoid colon, descending colon, transverse colon and ascending colon. 1 mm  polyp was removed from the cecum. He also had findings of nonbleeding internal hemorrhoids found with retroflexion of the scope.   4. Hyperlipidemia.  5. He reports no specific history of heart disease, lung disease, or hepatitis. 6. History of right leg weakness and discomfort and has had some extensive evaluation in the past. 7. Had severe injury of the right leg in [place] requiring some skin grafting.  8. History of anxiety.  9. History of kyphoscoliosis.  10. History of prostate cancer. His biopsy was June of 2011. He was treated with radiation. Continues to follow with Dr. Mina Marble in Oncology at [facility]. 11. History of Vitamin B12 deficiency.  12. History of skin cancer, right scalp, and Mohs surgery.   13. History of gastritis which was found on upper endoscopy, also had findings of erosive esophagitis and has been treated with the omeprazole.   PAST SURGICAL HISTORY:  1. Left knee arthroscopy. 2. LASIK eye surgery.   MEDICATIONS:  1. Neurontin 600 mg 4 to 5 times per day. 2. Simvastatin 20 mg daily.  3. Flomax 0.4 mg 2 tablets per day. 4. Omeprazole one per day. 5. Ambien 10 mg at bedtime p.r.n. for sleep.  6. Dymista spray both nostrils twice a day.   ALLERGIES: Venlafaxine causes sedation.   SOCIAL HISTORY: Does not smoke. Does not drink any alcohol. He is accompanied by his son.   FAMILY HISTORY: Negative for colon cancer. Positive for lung cancer, stroke, dementia.  REVIEW OF SYSTEMS: He reports no other recent acute illness such as cough, cold, or sore throat. He has had some recent visual changes managed at [facility]. He's recently been swallowing well. He has had no chest pains. No difficulty breathing. Has been recently voiding satisfactorily. He reports no significant ankle edema. No recent sores or boils. Review of systems otherwise negative.   PHYSICAL EXAMINATION:   GENERAL: He was awake, alert, and oriented resting in the hospital bed.   VITAL  SIGNS: Temperature 97.5, pulse 50, respirations 18, blood pressure 165/70, pulse oximeter 100%.   SKIN: Somewhat pale.   HEENT: Pupils equal and reactive to light. Extraocular movements are intact. Palpebral conjunctivae pale. Pharynx is pale.   NECK: No palpable mass.   BACK: Kyphotic.  LUNGS: Lung sounds are clear. No respiratory distress.   HEART: Regular rhythm, S1 and S2 without murmur.   ABDOMEN: Soft and flat, nontender with no palpable mass.   RECTAL: Can see there is some folds of skin externally suggestive of external hemorrhoids. Digital exam demonstrates good sphincter tone. No palpable rectal mass. Was able to pull out a small amount of stool which was a dark maroon, nearly black color. No bright red blood was seen.   EXTREMITIES: Trace bilateral ankle edema. Also some just trace of edema extending up as high as his knees.   NEUROLOGIC: Awake, alert, oriented, moving all extremities.   CLINICAL DATA: He had a CBC on February 26th with a hemoglobin of 13.9. His CBC today is with a hemoglobin of 11.2, platelet count 227,000. Also noted on 07/11/2011 his renal panel was normal. Liver panel normal.   IMPRESSION: Suspected colonic diverticular bleed which currently appears to be resolved.   RECOMMENDATIONS: I recommended a period of observation in the hospital on some IV fluids, clear liquid diet, serial hemoglobins.   I discussed that if he should have recurrence of bleeding while he is in the hospital, especially brisk bleeding, there could be a role for a Nuclear Medicine gastrointestinal bleeding study which might help to localize the bleeding. Also, there could be a role for colonoscopy in the next few days which might help to localize site of bleeding.   If he has no further bleeding, then just give a few days of observation in the hospital with serial hemoglobins and after tolerating clear liquids eventually advance to full liquids.   I also discussed the potential role  of surgery for diverticular bleeding, particularly if the bleeding persists and if it's localized and has continued brisk bleeding and even needing blood transfusions could lead to indications for partial colectomy.   Sometimes there might be a role for arteriography and endovascular management of colonic bleeding.   If all bleeding stops and goes home we could also see him one day in the office for further evaluation of hemorrhoids but it does not appear that that is the cause of his bleeding at present.   ____________________________ J. Rochel Brome, MD [id number] D: 09/21/2011 19:45:55 ET T: 09/22/2011 08:57:43 ET JOB#: [id number]  cc: Loreli Dollar, MD, <Dictator> Loreli Dollar MD ELECTRONICALLY SIGNED 09/22/2011 18:55
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
## 2014-09-06 NOTE — Consult Note (Signed)
Brief Consult Note: Diagnosis: [place] lower GI bleed likely diverticular, known IH, prostate cancer s/p treatment radiation therapy 2011, colonoscopy 2012-small adenom polyp, extensive diverticulosis, IH.   Patient was seen by consultant.   Consult note dictated.   Comments: I saw stool dark brown (not melena)  formed-no blood noted in commode. No abd pain/tenderness. VSS. Hgb stable. Plan: Discuss luminal evaluation with RTE. Pt wants definitive diagnosis of hematechezia with treatment of IH if the cause. He feels bleeding was too extensive for just hemorrhoids. I discussed diverticular bleed with him. No prior sign of radiation proctitis per colonscopy last year. No rectal symtpoms. No nuts/seeds, naproxen 1 dose three times weekly prn back pain. No melena. Hx of erosive gastritis/esophagitis per EGD 2012. No upper gi complaints. He has seen a  few darker than normal stools this week.  Electronic Signatures: Gershon Mussel (NP)  (Signed 10-May-13 15:13)  Authored: Brief Consult Note   Last Updated: 10-May-13 15:13 by Gershon Mussel (NP)
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
## 2014-09-14 ENCOUNTER — Ambulatory Visit (INDEPENDENT_AMBULATORY_CARE_PROVIDER_SITE_OTHER): Payer: Medicare Other | Admitting: General Surgery

## 2014-09-14 ENCOUNTER — Encounter: Payer: Self-pay | Admitting: General Surgery

## 2014-09-14 VITALS — BP 142/78 | HR 78 | Resp 14 | Ht 72.0 in

## 2014-09-14 DIAGNOSIS — K4090 Unilateral inguinal hernia, without obstruction or gangrene, not specified as recurrent: Secondary | ICD-10-CM

## 2014-09-14 NOTE — Progress Notes (Signed)
Patient ID: Ryan Allison, male   DOB: [birth year], 74 y.o.   MRN: [id number]  Chief Complaint  Patient presents with  . Pre-op Exam    left inguinal hernia    HPI Ryan Allison is a 74 y.o. male here today for his pre op left inguinal hernia repair scheduled for 09/23/14. He reports no new problems with his hernia.  HPI  Past Medical History  Diagnosis Date  . Intestinal bleeding   . Meniscus tear   . Arthritis   . Prostate enlargement   . Cancer 2011    prostate    Past Surgical History  Procedure Laterality Date  . Meniscus repair  2008  . Vascular surgery  2015    Family History  Problem Relation Age of Onset  . Cancer Father   . Cancer Brother     Social History History  Substance Use Topics  . Smoking status: Former Research scientist (life sciences)  . Smokeless tobacco: Never Used  . Alcohol Use: No    Allergies  Allergen Reactions  . Nsaids     Diverticular bleed  . Venlafaxine Other (See Comments)    sedation  . Celebrex [Celecoxib] Itching and Rash    Current Outpatient Prescriptions  Medication Sig Dispense Refill  . gabapentin (NEURONTIN) 600 MG tablet Take 600 mg by mouth 4 (four) times daily.    Marland Kitchen omeprazole (PRILOSEC) 40 MG capsule Take 40 mg by mouth daily.    . simvastatin (ZOCOR) 20 MG tablet Take 20 mg by mouth daily.    . tamsulosin (FLOMAX) 0.4 MG CAPS capsule Take 0.4 mg by mouth 2 (two) times daily.    . traMADol (ULTRAM) 50 MG tablet Take 50 mg by mouth every 6 (six) hours as needed for moderate pain.      No current facility-administered medications for this visit.    Review of Systems Review of Systems  Constitutional: Negative.   Respiratory: Negative.   Cardiovascular: Negative.     Blood pressure 142/78, pulse 78, resp. rate 14, height 6' (1.829 m).  Physical Exam Physical Exam  Constitutional: He is oriented to person, place, and time. He appears well-developed and well-nourished.  Eyes: Conjunctivae are normal. No scleral icterus.  Neck:  Neck supple.  Cardiovascular: Normal rate, regular rhythm and normal heart sounds.   Pulmonary/Chest: Effort normal and breath sounds normal.  Abdominal: Soft. Normal appearance. A hernia is present. Hernia confirmed positive in the left inguinal area.  Lymphadenopathy:    He has no cervical adenopathy.  Neurological: He is alert and oriented to person, place, and time.  Skin: Skin is warm and dry.    Data Reviewed Prior note  Assessment    Left inguinal hernia, medium size symptomatic.     Plan    Left inguinal hernia repair as scheduled.  Hernia precautions and incarceration were discussed with the patient. If they develop symptoms of an incarcerated hernia, they were encouraged to seek prompt medical attention.  I have recommended repair of the hernia using mesh on an outpatient basis. The risk of infection was reviewed. The role of prosthetic mesh to minimize the risk of recurrence was reviewed.  Patient's surgery has been scheduled for 09-23-14 at [facility].    PCP: DR Efrain Sella 09/14/2014, 4:44 PM

## 2014-09-14 NOTE — Patient Instructions (Addendum)
Open Hernia Repair Open hernia repair is surgery to fix a hernia. A hernia occurs when an internal organ or tissue pushes out through a weak spot in the abdominal wall muscles. Hernias commonly occur in the groin and around the navel. Most hernias tend to get worse over time. Surgery is often done to prevent the hernia from getting bigger, becoming uncomfortable, or becoming an emergency. Emergency surgery may be needed if abdominal contents get stuck in the opening (incarcerated hernia) or the blood supply gets cut off (strangulated hernia). In an open repair, a large cut (incision) is made in the abdomen to perform the surgery. LET [facility] CARE PROVIDER KNOW ABOUT:  Any allergies you have.  All medicines you are taking, including vitamins, herbs, eye drops, creams, and over-the-counter medicines.  Previous problems you or members of your family have had with the use of anesthetics.  Any blood disorders you have.  Previous surgeries you have had.  Medical conditions you have. RISKS AND COMPLICATIONS Generally, this is a safe procedure. However, as with any procedure, complications can occur. Possible complications include:  Infection.  Bleeding.  Nerve injury.  Chronic pain.  The hernia can come back.  Injury to the intestines. BEFORE THE PROCEDURE  Ask your health care provider about changing or stopping any regular medicines. Avoid taking aspirin or blood thinners as directed by your health care provider.  Do noteat or drink anything after midnight the night before surgery.  If you smoke, do not smoke for at least 2 weeks before your surgery.  Do not drink alcohol the day before your surgery.  Let your health care provider know if you develop a cold or any infection before your surgery.  Arrange for someone to drive you home after the procedure or after your hospital stay. Also arrange for someone to help you with activities during recovery. PROCEDURE   Small  monitors will be put on your body. They are used to check your heart, blood pressure, and oxygen level.   An IV access tube will be put into one of your veins. Medicine will be able to flow directly into your body through this IV tube.   You might be given a medicine to help you relax (sedative).   You will be given a medicine to make you sleep (general anesthetic). A breathing tube may be placed into your lungs during the procedure.  A cut (incision) is made over the hernia defect, and the contents are pushed back into the abdomen.  If the hernia is small, stitches may be used to bring the muscle edges back together.  Typically, a surgeon will place a mesh patch made of man-made material (synthetic) to cover the defect. The mesh is sewn to healthy muscle. This reduces the risk of the hernia coming back.  The tissue and skin over the hernia are then closed with stitches or staples.  If the hernia was large, a drain may be left in place to collect excess fluid where the hernia used to be.  Bandages (dressings) are used to cover the incision. AFTER THE PROCEDURE  You will be taken to a recovery area where your progress will be monitored.  If the hernia was small or in the groin (inguinal) region, you will likely be allowed to go home once you are awake, stable, and taking fluids well.  If the hernia was large, you may have to wait for your bowel function to return. You may need to stay in the hospital  for 2-3 days until you can eat and your pain is controlled. A drain may be left in place for 5-7 days. You will be taught how to care for the drain. Document Released: 10/25/2000 Document Revised: 02/19/2013 Document Reviewed: 12/11/2012 [facility] Patient Information 2015 [place], [place]. This information is not intended to replace advice given to you by your health care provider. Make sure you discuss any questions you have with your health care provider.  Patient's surgery has been  scheduled for 09-23-14 at [facility].

## 2014-09-15 ENCOUNTER — Other Ambulatory Visit: Payer: Self-pay

## 2014-09-15 ENCOUNTER — Encounter
Admission: RE | Admit: 2014-09-15 | Discharge: 2014-09-15 | Disposition: A | Discharge status: Home / Self Care | Payer: Medicare Other | Source: Ambulatory Visit | Attending: General Surgery | Admitting: General Surgery

## 2014-09-15 DIAGNOSIS — Z01810 Encounter for preprocedural cardiovascular examination: Secondary | ICD-10-CM | POA: Diagnosis not present

## 2014-09-15 DIAGNOSIS — E780 Pure hypercholesterolemia: Secondary | ICD-10-CM | POA: Diagnosis not present

## 2014-09-15 NOTE — Addendum Note (Signed)
Addended by: Christene Lye on: 09/15/2014 11:17 AM   Modules accepted: Orders

## 2014-09-21 ENCOUNTER — Telehealth: Payer: Self-pay | Admitting: *Deleted

## 2014-09-21 NOTE — Telephone Encounter (Signed)
Patient called and wanted to know if he can get his pain medication filled before his surgery on 09/23/14, he stated that after surgery he may not have a way to get it filled so he wanted to have that done before surgery. He said he can come by and pick up the prescription today or tomorrow

## 2014-09-22 MED ORDER — LEVOFLOXACIN IN D5W 500 MG/100ML IV SOLN
INTRAVENOUS | Status: AC
  Filled 2014-09-22: qty 100

## 2014-09-23 ENCOUNTER — Ambulatory Visit
Admission: RE | Admit: 2014-09-23 | Discharge: 2014-09-23 | Disposition: A | Discharge status: Home / Self Care | Payer: Medicare Other | Source: Ambulatory Visit | Attending: General Surgery | Admitting: General Surgery

## 2014-09-23 ENCOUNTER — Encounter: Payer: Self-pay | Admitting: *Deleted

## 2014-09-23 ENCOUNTER — Ambulatory Visit: Payer: Medicare Other | Admitting: Certified Registered Nurse Anesthetist

## 2014-09-23 ENCOUNTER — Encounter
Admission: RE | Disposition: A | Discharge status: Home / Self Care | Payer: Self-pay | Source: Ambulatory Visit | Attending: General Surgery

## 2014-09-23 DIAGNOSIS — K4090 Unilateral inguinal hernia, without obstruction or gangrene, not specified as recurrent: Secondary | ICD-10-CM | POA: Insufficient documentation

## 2014-09-23 DIAGNOSIS — Z809 Family history of malignant neoplasm, unspecified: Secondary | ICD-10-CM | POA: Insufficient documentation

## 2014-09-23 DIAGNOSIS — N400 Enlarged prostate without lower urinary tract symptoms: Secondary | ICD-10-CM | POA: Diagnosis not present

## 2014-09-23 DIAGNOSIS — Z888 Allergy status to other drugs, medicaments and biological substances status: Secondary | ICD-10-CM | POA: Insufficient documentation

## 2014-09-23 DIAGNOSIS — Z79899 Other long term (current) drug therapy: Secondary | ICD-10-CM | POA: Diagnosis not present

## 2014-09-23 DIAGNOSIS — Z87891 Personal history of nicotine dependence: Secondary | ICD-10-CM | POA: Insufficient documentation

## 2014-09-23 DIAGNOSIS — M1990 Unspecified osteoarthritis, unspecified site: Secondary | ICD-10-CM | POA: Diagnosis not present

## 2014-09-23 DIAGNOSIS — Z8546 Personal history of malignant neoplasm of prostate: Secondary | ICD-10-CM | POA: Insufficient documentation

## 2014-09-23 HISTORY — PX: INGUINAL HERNIA REPAIR: SHX194

## 2014-09-23 HISTORY — PX: HERNIA REPAIR: SHX51

## 2014-09-23 SURGERY — REPAIR, HERNIA, INGUINAL, ADULT
Anesthesia: General | Laterality: Left | Wound class: Clean

## 2014-09-23 MED ORDER — HYDROMORPHONE HCL 1 MG/ML IJ SOLN
INTRAMUSCULAR | Status: AC
  Administered 2014-09-23: 0.5 mg via INTRAVENOUS
  Filled 2014-09-23: qty 1

## 2014-09-23 MED ORDER — FENTANYL CITRATE (PF) 100 MCG/2ML IJ SOLN
INTRAMUSCULAR | Status: DC | PRN
  Administered 2014-09-23: 50 ug via INTRAVENOUS

## 2014-09-23 MED ORDER — PROPOFOL 10 MG/ML IV BOLUS
INTRAVENOUS | Status: DC | PRN
  Administered 2014-09-23: 220 mg via INTRAVENOUS

## 2014-09-23 MED ORDER — MIDAZOLAM HCL 2 MG/2ML IJ SOLN
INTRAMUSCULAR | Status: DC | PRN
  Administered 2014-09-23: 2 mg via INTRAVENOUS

## 2014-09-23 MED ORDER — GLYCOPYRROLATE 0.2 MG/ML IJ SOLN
INTRAMUSCULAR | Status: DC | PRN
  Administered 2014-09-23: 0.2 mg via INTRAVENOUS

## 2014-09-23 MED ORDER — EPHEDRINE SULFATE 50 MG/ML IJ SOLN
INTRAMUSCULAR | Status: DC | PRN
  Administered 2014-09-23 (×2): 10 mg via INTRAVENOUS

## 2014-09-23 MED ORDER — DEXAMETHASONE SODIUM PHOSPHATE 4 MG/ML IJ SOLN
INTRAMUSCULAR | Status: DC | PRN
  Administered 2014-09-23: 5 mg via INTRAVENOUS

## 2014-09-23 MED ORDER — HYDROMORPHONE HCL 1 MG/ML IJ SOLN
0.2500 mg | INTRAMUSCULAR | Status: DC | PRN
  Administered 2014-09-23 (×4): 0.5 mg via INTRAVENOUS

## 2014-09-23 MED ORDER — BUPIVACAINE HCL (PF) 0.5 % IJ SOLN
INTRAMUSCULAR | Status: AC
  Filled 2014-09-23: qty 30

## 2014-09-23 MED ORDER — ONDANSETRON HCL 4 MG/2ML IJ SOLN
4.0000 mg | Freq: Once | INTRAMUSCULAR | Status: DC | PRN
Start: 2014-09-23 — End: 2014-09-23

## 2014-09-23 MED ORDER — LIDOCAINE HCL 1 % IJ SOLN
INTRAMUSCULAR | Status: DC | PRN
  Administered 2014-09-23: 18 mL via INTRAMUSCULAR

## 2014-09-23 MED ORDER — ACETAMINOPHEN 10 MG/ML IV SOLN
INTRAVENOUS | Status: AC
  Administered 2014-09-23: 1000 mg via INTRAVENOUS
  Filled 2014-09-23: qty 100

## 2014-09-23 MED ORDER — CEFAZOLIN SODIUM-DEXTROSE 2-3 GM-% IV SOLR
INTRAVENOUS | Status: AC
  Filled 2014-09-23: qty 50

## 2014-09-23 MED ORDER — LIDOCAINE HCL (PF) 1 % IJ SOLN
INTRAMUSCULAR | Status: AC
  Filled 2014-09-23: qty 30

## 2014-09-23 MED ORDER — ONDANSETRON HCL 4 MG/2ML IJ SOLN
INTRAMUSCULAR | Status: DC | PRN
Start: 2014-09-23 — End: 2014-09-23
  Administered 2014-09-23: 4 mg via INTRAVENOUS

## 2014-09-23 MED ORDER — HYDROMORPHONE HCL 1 MG/ML IJ SOLN
INTRAMUSCULAR | Status: AC
  Filled 2014-09-23: qty 1

## 2014-09-23 MED ORDER — CEFAZOLIN SODIUM-DEXTROSE 2-3 GM-% IV SOLR
2.0000 g | INTRAVENOUS | Status: AC
  Administered 2014-09-23: 11:00:00 via INTRAVENOUS

## 2014-09-23 MED ORDER — LIDOCAINE HCL (CARDIAC) 20 MG/ML IV SOLN
INTRAVENOUS | Status: DC | PRN
  Administered 2014-09-23: 60 mg via INTRAVENOUS

## 2014-09-23 MED ORDER — LACTATED RINGERS IV SOLN
INTRAVENOUS | Status: DC
  Administered 2014-09-23 (×2): via INTRAVENOUS

## 2014-09-23 SURGICAL SUPPLY — 29 items
BLADE SURG 15 STRL SS SAFETY (BLADE) ×3 IMPLANT
CANISTER SUCT 1200ML W/VALVE (MISCELLANEOUS) ×3 IMPLANT
CHLORAPREP W/TINT 26ML (MISCELLANEOUS) ×3 IMPLANT
DECANTER SPIKE VIAL GLASS SM (MISCELLANEOUS) ×6 IMPLANT
DRAIN PENROSE 1/4X12 LTX (DRAIN) ×3 IMPLANT
DRAPE LAPAROTOMY 100X77 ABD (DRAPES) ×3 IMPLANT
GLOVE BIO SURGEON STRL SZ7 (GLOVE) ×15 IMPLANT
GOWN STRL REUS W/ TWL LRG LVL3 (GOWN DISPOSABLE) ×3 IMPLANT
GOWN STRL REUS W/TWL LRG LVL3 (GOWN DISPOSABLE) ×6
KIT RM TURNOVER STRD PROC AR (KITS) ×3 IMPLANT
LABEL OR SOLS (LABEL) ×3 IMPLANT
LIQUID BAND (GAUZE/BANDAGES/DRESSINGS) ×3 IMPLANT
MESH PARIETEX PROGRIP LEFT (Mesh General) ×3 IMPLANT
NDL HPO THNWL 1X22GA REG BVL (NEEDLE) IMPLANT
NDL SAFETY 25GX1.5 (NEEDLE) ×3 IMPLANT
NEEDLE SAFETY 22GX1 (NEEDLE)
NS IRRIG 500ML POUR BTL (IV SOLUTION) ×3 IMPLANT
PACK BASIN MINOR ARMC (MISCELLANEOUS) ×3 IMPLANT
PAD GROUND ADULT SPLIT (MISCELLANEOUS) ×3 IMPLANT
SUT CHROMIC 4 0 FS 2 27 (SUTURE) IMPLANT
SUT PDS 2-0 27IN (SUTURE) ×6 IMPLANT
SUT VIC AB 2-0 SH 27 (SUTURE) ×2
SUT VIC AB 2-0 SH 27XBRD (SUTURE) ×1 IMPLANT
SUT VIC AB 3-0 54X BRD REEL (SUTURE) ×1 IMPLANT
SUT VIC AB 3-0 BRD 54 (SUTURE) ×2
SUT VIC AB 3-0 SH 27 (SUTURE) ×2
SUT VIC AB 3-0 SH 27X BRD (SUTURE) ×1 IMPLANT
SUT VIC AB 4-0 FS2 27 (SUTURE) ×3 IMPLANT
SYR CONTROL 10ML (SYRINGE) ×3 IMPLANT

## 2014-09-23 NOTE — Anesthesia Procedure Notes (Signed)
Procedure Name: LMA Insertion Performed by: Guerline Happ Pre-anesthesia Checklist: Patient identified, Emergency Drugs available, Suction available, Patient being monitored and Timeout performed Patient Re-evaluated:Patient Re-evaluated prior to inductionOxygen Delivery Method: Circle system utilized Preoxygenation: Pre-oxygenation with 100% oxygen Intubation Type: IV induction Ventilation: Mask ventilation without difficulty LMA Size: 4.0 Tube type: Oral Number of attempts: 1 Placement Confirmation: positive ETCO2 and breath sounds checked- equal and bilateral Tube secured with: Tape Dental Injury: Teeth and Oropharynx as per pre-operative assessment

## 2014-09-23 NOTE — Anesthesia Postprocedure Evaluation (Signed)
  Anesthesia Post-op Note  Patient: Ryan Allison  Procedure(s) Performed: Procedure(s): HERNIA REPAIR INGUINAL ADULT (Left)  Anesthesia type:General  Patient location: PACU  Post pain: Pain level controlled  Post assessment: Post-op Vital signs reviewed, Patient's Cardiovascular Status Stable, Respiratory Function Stable, Patent Airway and No signs of Nausea or vomiting  Post vital signs: Reviewed and stable  Last Vitals:  Filed Vitals:   09/23/14 1251  BP:   Pulse:   Temp: 36.1 C  Resp:     Level of consciousness: awake, alert  and patient cooperative  Complications: No apparent anesthesia complications

## 2014-09-23 NOTE — Op Note (Signed)
Preop diagnosis: Left inguinal hernia  Post op diagnosis: Left inguinal hernia indirect  Operation: Repair left inguinal hernia with the Parietex Progrip mesh  Anesthesia: Gen.  Complications: None  EBL: Minimal  Drains: None  Description: Patient was put to sleep with an LMA and the left groin was prepped and draped sterile field. Timeout performed. Incision was mapped along the medial two thirds of the inguinal canal. 0.5% Marcaine was instilled for postop analgesia. Skin incision was made and deepened through the layers down the external oblique. Bleeding controlled with cautery and  3-0 Vicryl ligatures. The external oblique was opened along the line of its fibers. The cord and ilioinguinal nerve were then carefully dissected off the posterior wall. No direct hernia was noted medial to the cord. The ilioinguinal nerve was preserved. The fascia covering the cord was opened longitudinally. An indirect hernial sac was identified with a scarred apex near the external ring area. No contents noted within the hernia. The sac was freed all the way to the internal ring and then suture ligated at a high level with 2-0 Vicryl. The suture was used to tack the peritoneal stump to the undersurface of the internal oblique muscle. Fascia covering the cord was then reapproximated with  interrupted 2-0 Vicryl. Posterior wall was reinforced by placing Parietex mesh after trimming it to match the size of the inguinal canal. This mesh was tacked to the pubic tubercle with 3 stitches of 2-0 PDS. The external oblique was then closed over the canal with  a running suture of the 2-0 PDS. Wound was irrigated subcutaneous tissue was closed with 3-0 Vicryl. Skin closed with subcuticular 4-0 Vicryl. Incision covered with a LiquiBand Patient subsequently was extubated and returned to recovery room in stable condition.

## 2014-09-23 NOTE — Transfer of Care (Incomplete)
Immediate Anesthesia Transfer of Care Note  Patient: Ryan Allison  Procedure(s) Performed: Procedure(s): HERNIA REPAIR INGUINAL ADULT (Left)  Patient Location: PACU  Anesthesia Type:General  Level of Consciousness: awake, alert , oriented and patient cooperative  Airway & Oxygen Therapy: Patient Spontanous Breathing  Post-op Assessment: {ASSESSMENT;POST-OP QJJHER:74081}  Post vital signs: {DESC; ANE POST KGYJEH:63149}  Last Vitals:  Filed Vitals:   09/23/14 1251  BP:   Pulse:   Temp: 36.1 C  Resp:     Complications: {FINDINGS; ANE POST COMPLICATIONS:19485}

## 2014-09-23 NOTE — Interval H&P Note (Signed)
History and Physical Interval Note:  09/23/2014 10:56 AM  Ryan Allison  has presented today for surgery, with the diagnosis of LEFT INGUINAL HERNIA  The various methods of treatment have been discussed with the patient and family. After consideration of risks, benefits and other options for treatment, the patient has consented to  Procedure(s): HERNIA REPAIR INGUINAL ADULT (Left) as a surgical intervention .  The patient's history has been reviewed, patient examined, no change in status, stable for surgery.  I have reviewed the patient's chart and labs.  Questions were answered to the patient's satisfaction.     SANKAR,SEEPLAPUTHUR G

## 2014-09-23 NOTE — Anesthesia Preprocedure Evaluation (Addendum)
Anesthesia Evaluation  Patient identified by MRN, date of birth, ID band Patient awake    Reviewed: Allergy & Precautions, NPO status , Patient's Chart, lab work & pertinent test results  History of Anesthesia Complications Negative for: history of anesthetic complications  Airway Mallampati: III  TM Distance: >3 FB Neck ROM: Full    Dental no notable dental hx.    Pulmonary neg pulmonary ROS, former smoker,  breath sounds clear to auscultation  Pulmonary exam normal       Cardiovascular Exercise Tolerance: Good negative cardio ROS Normal cardiovascular examRhythm:Regular Rate:Normal     Neuro/Psych negative neurological ROS  negative psych ROS   GI/Hepatic Neg liver ROS, PUD,   Endo/Other  negative endocrine ROS  Renal/GU negative Renal ROS  negative genitourinary   Musculoskeletal  (+) Arthritis -, Osteoarthritis,    Abdominal   Peds negative pediatric ROS (+)  Hematology negative hematology ROS (+)   Anesthesia Other Findings   Reproductive/Obstetrics negative OB ROS                            Anesthesia Physical Anesthesia Plan  ASA: II  Anesthesia Plan: General   Post-op Pain Management:    Induction: Intravenous  Airway Management Planned: LMA  Additional Equipment:   Intra-op Plan:   Post-operative Plan: Extubation in OR  Informed Consent: I have reviewed the patients History and Physical, chart, labs and discussed the procedure including the risks, benefits and alternatives for the proposed anesthesia with the patient or authorized representative who has indicated his/her understanding and acceptance.   Dental advisory given  Plan Discussed with: CRNA and Surgeon  Anesthesia Plan Comments:         Anesthesia Quick Evaluation

## 2014-09-23 NOTE — Transfer of Care (Signed)
Immediate Anesthesia Transfer of Care Note  Patient: Ryan Allison  Procedure(s) Performed: Procedure(s): HERNIA REPAIR INGUINAL ADULT (Left)  Patient Location: PACU  Anesthesia Type:General  Level of Consciousness: awake, alert , oriented and patient cooperative  Airway & Oxygen Therapy: Patient Spontanous Breathing  Post-op Assessment: Report given to RN and Post -op Vital signs reviewed and stable  Post vital signs: Reviewed and stable  Last Vitals:  Filed Vitals:   09/23/14 1251  BP:   Pulse:   Temp: 36.1 C  Resp:     Complications: No apparent anesthesia complications

## 2014-09-23 NOTE — Discharge Instructions (Addendum)
Inguinal Hernia, Adult  °Care After °Refer to this sheet in the next few weeks. These discharge instructions provide you with general information on caring for yourself after you leave the hospital. Your caregiver may also give you specific instructions. Your treatment has been planned according to the most current medical practices available, but unavoidable complications sometimes occur. If you have any problems or questions after discharge, please call your caregiver. °HOME CARE INSTRUCTIONS °· Put ice on the operative site. °¨ Put ice in a plastic bag. °¨ Place a towel between your skin and the bag. °¨ Leave the ice on for 15-20 minutes at a time, 03-04 times a day while awake. °· Change bandages (dressings) as directed. °· Keep the wound dry and clean. The wound may be washed gently with soap and water. Gently blot or dab the wound dry. It is okay to take showers 24 to 48 hours after surgery. Do not take baths, use swimming pools, or use hot tubs for 10 days, or as directed by your caregiver. °· Only take over-the-counter or prescription medicines for pain, discomfort, or fever as directed by your caregiver. °· Continue your normal diet as directed. °· Do not lift anything more than 10 pounds or play contact sports for 3 weeks, or as directed. °SEEK MEDICAL CARE IF: °· There is redness, swelling, or increasing pain in the wound. °· There is fluid (pus) coming from the wound. °· There is drainage from a wound lasting longer than 1 day. °· You have an oral temperature above 102° F (38.9° C). °· You notice a bad smell coming from the wound or dressing. °· The wound breaks open after the stitches (sutures) have been removed. °· You notice increasing pain in the shoulders (shoulder strap areas). °· You develop dizzy episodes or fainting while standing. °· You feel sick to your stomach (nauseous) or throw up (vomit). °SEEK IMMEDIATE MEDICAL CARE IF: °· You develop a rash. °· You have difficulty breathing. °· You  develop a reaction or have side effects to medicines you were given. °MAKE SURE YOU:  °· Understand these instructions. °· Will watch your condition. °· Will get help right away if you are not doing well or get worse. °Document Released: 06/01/2006 Document Revised: 07/24/2011 Document Reviewed: 03/31/2009 °ExitCare® Patient Information ©2015 [employer]. This information is not intended to replace advice given to you by your health care provider. Make sure you discuss any questions you have with your health care provider. ° ° ° °General Anesthesia, Care After °Refer to this sheet in the next few weeks. These instructions provide you with information on caring for yourself after your procedure. Your health care provider may also give you more specific instructions. Your treatment has been planned according to current medical practices, but problems sometimes occur. Call your health care provider if you have any problems or questions after your procedure. °WHAT TO EXPECT AFTER THE PROCEDURE °After the procedure, it is typical to experience: °· Sleepiness. °· Nausea and vomiting. °HOME CARE INSTRUCTIONS °· For the first 24 hours after general anesthesia: °· Have a responsible person with you. °· Do not drive a car. If you are alone, do not take public transportation. °· Do not drink alcohol. °· Do not take medicine that has not been prescribed by your health care provider. °· Do not sign important papers or make important decisions. °· You may resume a normal diet and activities as directed by your health care provider. °· Change bandages (dressings) as directed. °·   If you have questions or problems that seem related to general anesthesia, call the hospital and ask for the anesthetist or anesthesiologist on call. °SEEK MEDICAL CARE IF: °· You have nausea and vomiting that continue the day after anesthesia. °· You develop a rash. °SEEK IMMEDIATE MEDICAL CARE IF:  °· You have difficulty breathing. °· You have chest  pain. °· You have any allergic problems. °Document Released: 08/07/2000 Document Revised: 05/06/2013 Document Reviewed: 11/14/2012 °ExitCare® Patient Information ©2015 [employer]. This information is not intended to replace advice given to you by your health care provider. Make sure you discuss any questions you have with your health care provider. ° °

## 2014-09-23 NOTE — H&P (View-Only) (Signed)
Patient ID: Ryan Allison, male   DOB: 1940-11-11, 74 y.o.   MRN: [id number]  Chief Complaint  Patient presents with  . Pre-op Exam    left inguinal hernia    HPI Ryan Allison is a 74 y.o. male here today for his pre op left inguinal hernia repair scheduled for 09/23/14. He reports no new problems with his hernia.  HPI  Past Medical History  Diagnosis Date  . Intestinal bleeding   . Meniscus tear   . Arthritis   . Prostate enlargement   . Cancer 2011    prostate    Past Surgical History  Procedure Laterality Date  . Meniscus repair  2008  . Vascular surgery  2015    Family History  Problem Relation Age of Onset  . Cancer Father   . Cancer Brother     Social History History  Substance Use Topics  . Smoking status: Former Research scientist (life sciences)  . Smokeless tobacco: Never Used  . Alcohol Use: No    Allergies  Allergen Reactions  . Nsaids     Diverticular bleed  . Venlafaxine Other (See Comments)    sedation  . Celebrex [Celecoxib] Itching and Rash    Current Outpatient Prescriptions  Medication Sig Dispense Refill  . gabapentin (NEURONTIN) 600 MG tablet Take 600 mg by mouth 4 (four) times daily.    Marland Kitchen omeprazole (PRILOSEC) 40 MG capsule Take 40 mg by mouth daily.    . simvastatin (ZOCOR) 20 MG tablet Take 20 mg by mouth daily.    . tamsulosin (FLOMAX) 0.4 MG CAPS capsule Take 0.4 mg by mouth 2 (two) times daily.    . traMADol (ULTRAM) 50 MG tablet Take 50 mg by mouth every 6 (six) hours as needed for moderate pain.      No current facility-administered medications for this visit.    Review of Systems Review of Systems  Constitutional: Negative.   Respiratory: Negative.   Cardiovascular: Negative.     Blood pressure 142/78, pulse 78, resp. rate 14, height 6' (1.829 m).  Physical Exam Physical Exam  Constitutional: He is oriented to person, place, and time. He appears well-developed and well-nourished.  Eyes: Conjunctivae are normal. No scleral icterus.  Neck:  Neck supple.  Cardiovascular: Normal rate, regular rhythm and normal heart sounds.   Pulmonary/Chest: Effort normal and breath sounds normal.  Abdominal: Soft. Normal appearance. A hernia is present. Hernia confirmed positive in the left inguinal area.  Lymphadenopathy:    He has no cervical adenopathy.  Neurological: He is alert and oriented to person, place, and time.  Skin: Skin is warm and dry.    Data Reviewed Prior note  Assessment    Left inguinal hernia, medium size symptomatic.     Plan    Left inguinal hernia repair as scheduled.  Hernia precautions and incarceration were discussed with the patient. If they develop symptoms of an incarcerated hernia, they were encouraged to seek prompt medical attention.  I have recommended repair of the hernia using mesh on an outpatient basis. The risk of infection was reviewed. The role of prosthetic mesh to minimize the risk of recurrence was reviewed.  Patient's surgery has been scheduled for 09-23-14 at [facility].    PCP: DR Ryan Allison 09/14/2014, 4:44 PM

## 2014-09-27 ENCOUNTER — Emergency Department: Payer: Medicare Other

## 2014-09-27 ENCOUNTER — Encounter: Payer: Self-pay | Admitting: General Surgery

## 2014-09-27 ENCOUNTER — Emergency Department
Admission: EM | Admit: 2014-09-27 | Discharge: 2014-09-27 | Disposition: A | Discharge status: Home / Self Care | Payer: Medicare Other | Attending: Emergency Medicine | Admitting: Emergency Medicine

## 2014-09-27 DIAGNOSIS — G8918 Other acute postprocedural pain: Secondary | ICD-10-CM | POA: Diagnosis not present

## 2014-09-27 DIAGNOSIS — R1032 Left lower quadrant pain: Secondary | ICD-10-CM

## 2014-09-27 DIAGNOSIS — Z79899 Other long term (current) drug therapy: Secondary | ICD-10-CM | POA: Insufficient documentation

## 2014-09-27 DIAGNOSIS — Z87891 Personal history of nicotine dependence: Secondary | ICD-10-CM | POA: Diagnosis not present

## 2014-09-27 DIAGNOSIS — Z9889 Other specified postprocedural states: Secondary | ICD-10-CM | POA: Diagnosis not present

## 2014-09-27 DIAGNOSIS — R1030 Lower abdominal pain, unspecified: Secondary | ICD-10-CM | POA: Diagnosis not present

## 2014-09-27 LAB — COMPREHENSIVE METABOLIC PANEL
ALT: 11 U/L — AB (ref 17–63)
ANION GAP: 10 (ref 5–15)
AST: 14 U/L — ABNORMAL LOW (ref 15–41)
Albumin: 4.3 g/dL (ref 3.5–5.0)
Alkaline Phosphatase: 76 U/L (ref 38–126)
BUN: 14 mg/dL (ref 6–20)
CALCIUM: 9.8 mg/dL (ref 8.9–10.3)
CO2: 29 mmol/L (ref 22–32)
Chloride: 99 mmol/L — ABNORMAL LOW (ref 101–111)
Creatinine, Ser: 0.73 mg/dL (ref 0.61–1.24)
GFR calc Af Amer: >60 mL/min (ref >60)
GFR calc non Af Amer: >60 mL/min (ref >60)
Glucose, Bld: 107 mg/dL — ABNORMAL HIGH (ref 65–99)
POTASSIUM: 4.4 mmol/L (ref 3.5–5.1)
SODIUM: 138 mmol/L (ref 135–145)
Total Bilirubin: 1 mg/dL (ref 0.3–1.2)
Total Protein: 8 g/dL (ref 6.5–8.1)

## 2014-09-27 LAB — CBC WITH DIFFERENTIAL/PLATELET
Basophils Absolute: 0 10*3/uL (ref 0–0.1)
Basophils Relative: 0 %
Eosinophils Absolute: 0 10*3/uL (ref 0–0.7)
Eosinophils Relative: 0 %
HCT: 44.1 % (ref 40.0–52.0)
Hemoglobin: 14.6 g/dL (ref 13.0–18.0)
LYMPHS PCT: 13 %
Lymphs Abs: 0.8 10*3/uL — ABNORMAL LOW (ref 1.0–3.6)
MCH: 32.6 pg (ref 26.0–34.0)
MCHC: 33.2 g/dL (ref 32.0–36.0)
MCV: 98.4 fL (ref 80.0–100.0)
MONOS PCT: 7 %
Monocytes Absolute: 0.4 10*3/uL (ref 0.2–1.0)
Neutro Abs: 4.8 10*3/uL (ref 1.4–6.5)
Neutrophils Relative %: 80 %
PLATELETS: 228 10*3/uL (ref 150–440)
RBC: 4.49 MIL/uL (ref 4.40–5.90)
RDW: 12.6 % (ref 11.5–14.5)
WBC: 6.1 10*3/uL (ref 3.8–10.6)

## 2014-09-27 LAB — URINALYSIS COMPLETE WITH MICROSCOPIC (ARMC ONLY)
Bacteria, UA: NONE SEEN
Bilirubin Urine: NEGATIVE
GLUCOSE, UA: NEGATIVE mg/dL
HGB URINE DIPSTICK: NEGATIVE
Leukocytes, UA: NEGATIVE
Nitrite: NEGATIVE
PH: 6 (ref 5.0–8.0)
PROTEIN: NEGATIVE mg/dL
SQUAMOUS EPITHELIAL / LPF: NONE SEEN
Specific Gravity, Urine: 1.012 (ref 1.005–1.030)

## 2014-09-27 MED ORDER — MORPHINE SULFATE 4 MG/ML IJ SOLN
INTRAMUSCULAR | Status: AC
  Administered 2014-09-27: 4 mg via INTRAVENOUS
  Filled 2014-09-27: qty 1

## 2014-09-27 MED ORDER — ONDANSETRON HCL 4 MG PO TABS: 4.0000 mg | ORAL_TABLET | Freq: Every day | ORAL | Status: DC | PRN

## 2014-09-27 MED ORDER — HYDROCODONE-ACETAMINOPHEN 5-325 MG PO TABS
1.0000 | ORAL_TABLET | Freq: Once | ORAL | Status: AC
  Administered 2014-09-27: 1 via ORAL

## 2014-09-27 MED ORDER — IOHEXOL 300 MG/ML  SOLN
100.0000 mL | Freq: Once | INTRAMUSCULAR | Status: AC | PRN
  Administered 2014-09-27: 100 mL via INTRAVENOUS

## 2014-09-27 MED ORDER — HYDROCODONE-ACETAMINOPHEN 5-300 MG PO TABS: 1.0000 | ORAL_TABLET | Freq: Four times a day (QID) | ORAL | Status: DC

## 2014-09-27 MED ORDER — ONDANSETRON HCL 4 MG/2ML IJ SOLN
INTRAMUSCULAR | Status: AC
  Administered 2014-09-27: 4 mg via INTRAVENOUS
  Filled 2014-09-27: qty 2

## 2014-09-27 MED ORDER — SODIUM CHLORIDE 0.9 % IV SOLN: Freq: Once | INTRAVENOUS | Status: DC

## 2014-09-27 MED ORDER — HYDROCODONE-ACETAMINOPHEN 5-325 MG PO TABS
ORAL_TABLET | ORAL | Status: AC
  Filled 2014-09-27: qty 1

## 2014-09-27 MED ORDER — IOHEXOL 240 MG/ML SOLN
25.0000 mL | INTRAMUSCULAR | Status: AC
Start: 2014-09-27 — End: 2014-09-27

## 2014-09-27 MED ORDER — MORPHINE SULFATE 4 MG/ML IJ SOLN
4.0000 mg | Freq: Once | INTRAMUSCULAR | Status: AC
Start: 2014-09-27 — End: 2014-09-27
  Administered 2014-09-27: 4 mg via INTRAVENOUS

## 2014-09-27 MED ORDER — ONDANSETRON HCL 4 MG/2ML IJ SOLN
4.0000 mg | Freq: Once | INTRAMUSCULAR | Status: AC
  Administered 2014-09-27: 4 mg via INTRAVENOUS

## 2014-09-27 MED ORDER — MORPHINE SULFATE 4 MG/ML IJ SOLN
4.0000 mg | Freq: Once | INTRAMUSCULAR | Status: AC
  Administered 2014-09-27: 4 mg via INTRAVENOUS

## 2014-09-27 MED ORDER — SODIUM CHLORIDE 0.9 % IV SOLN
Freq: Once | INTRAVENOUS | Status: AC
  Administered 2014-09-27: 16:00:00 via INTRAVENOUS

## 2014-09-27 NOTE — ED Notes (Signed)
Patient to ED with c.o fever, chills, abd abdominal pain. Patient states that he recently had a hernia repair on Wednesday. Patient states two days ago symptoms started. Patient states he has been taking prescribed pain medication with no relief. No drainage around incision site. Incision site is tender to touch. Patient states he has been experiencing nausea.

## 2014-09-27 NOTE — ED Provider Notes (Signed)
[facility] Emergency Department Provider Note     Time seen: 1720  I have reviewed the triage vital signs and the nursing notes.   HISTORY  Chief Complaint Post-op Problem    HPI Ryan Allison is a 74 y.o. male 's ER for recheck of inguinal hernia repair. Patient states he had an inguinal hernia repair by Dr. Jamal Collin on Wednesday. His abdominal swelling since that period time as well as chills. He is unsure of his had fever,had significant abdominal pain and swelling pain is fairly severe. Movement makes it worse.     Past Medical History  Diagnosis Date  . Intestinal bleeding   . Meniscus tear   . Arthritis   . Prostate enlargement   . Cancer 2011    prostate    There are no active problems to display for this patient.   Past Surgical History  Procedure Laterality Date  . Meniscus repair  2008  . Vascular surgery  2015  . Inguinal hernia repair Left 09/23/2014    Procedure: HERNIA REPAIR INGUINAL ADULT;  Surgeon: Christene Lye, MD;  Location: [facility] ORS;  Service: General;  Laterality: Left;    Current Outpatient Rx  Name  Route  Sig  Dispense  Refill  . gabapentin (NEURONTIN) 600 MG tablet   Oral   Take 600 mg by mouth 4 (four) times daily.         Marland Kitchen omeprazole (PRILOSEC) 40 MG capsule   Oral   Take 40 mg by mouth daily.         . simvastatin (ZOCOR) 20 MG tablet   Oral   Take 20 mg by mouth daily.         . tamsulosin (FLOMAX) 0.4 MG CAPS capsule   Oral   Take 0.4 mg by mouth 2 (two) times daily.         . traMADol (ULTRAM) 50 MG tablet   Oral   Take 50 mg by mouth every 6 (six) hours as needed for moderate pain.            Allergies Nsaids; Venlafaxine; and Celebrex  Family History  Problem Relation Age of Onset  . Cancer Father   . Cancer Brother     Social History History  Substance Use Topics  . Smoking status: Former Research scientist (life sciences)  . Smokeless tobacco: Never Used  . Alcohol Use: No    Review  of Systems Constitutional: Negative for fever. Eyes: Negative for visual changes. ENT: Negative for sore throat. Cardiovascular: Negative for chest pain. Respiratory: Negative for shortness of breath. Gastrointestinal: Negative for abdominal pain, vomiting and diarrhea. Genitourinary: Negative for dysuria. Musculoskeletal: Negative for back pain. Skin: Negative for rash. Neurological: Negative for headaches, focal weakness or numbness.  10-point ROS otherwise negative.  ____________________________________________   PHYSICAL EXAM:  VITAL SIGNS: ED Triage Vitals  Enc Vitals Group     BP 09/27/14 1602 156/86 mmHg     Pulse Rate 09/27/14 1602 95     Resp 09/27/14 1602 20     Temp 09/27/14 1602 98.1 F (36.7 C)     Temp Source 09/27/14 1602 Oral     SpO2 09/27/14 1602 100 %     Weight 09/27/14 1602 171 lb (77.565 kg)     Height 09/27/14 1602 6' (1.829 m)     Head Cir --      Peak Flow --      Pain Score 09/27/14 1603 9     Pain  Loc --      Pain Edu? --      Excl. in [place]? --     Constitutional: Alert and oriented. Well appearing and in no distress. Eyes: Conjunctivae are normal. PERRL. Normal extraocular movements. ENT   Head: Normocephalic and atraumatic.   Nose: No congestion/rhinnorhea.   Mouth/Throat: Mucous membranes are moist.   Neck: No stridor. Hematological/Lymphatic/Immunilogical: No cervical lymphadenopathy. Cardiovascular: Normal rate, regular rhythm. Normal and symmetric distal pulses are present in all extremities. No murmurs, rubs, or gallops. Respiratory: Normal respiratory effort without tachypnea nor retractions. Breath sounds are clear and equal bilaterally. No wheezes/rales/rhonchi. Gastrointestinal: Soft and nontender. No distention. No abdominal bruits. There is no CVA tenderness. Musculoskeletal: Nontender with normal range of motion in all extremities. No joint effusions.  No lower extremity tenderness nor edema. Neurologic:  Normal  speech and language. No gross focal neurologic deficits are appreciated. Speech is normal. No gait instability. Skin:  Skin is warm, dry and intact. No rash noted. Psychiatric: Mood and affect are normal. Speech and behavior are normal. Patient exhibits appropriate insight and judgment.  ____________________________________________    LABS (pertinent positives/negatives)  Labs Reviewed  CBC WITH DIFFERENTIAL/PLATELET - Abnormal; Notable for the following:    Lymphs Abs 0.8 (*)    All other components within normal limits  COMPREHENSIVE METABOLIC PANEL - Abnormal; Notable for the following:    Chloride 99 (*)    Glucose, Bld 107 (*)    AST 14 (*)    ALT 11 (*)    All other components within normal limits    ____________________________________________  ED COURSE:  Pertinent labs & imaging results that were available during my care of the patient were reviewed by me and considered in my medical decision making (see chart for details).   ____________________________________________   RADIOLOGY  IMPRESSION: Postsurgical changes in the left inguinal region from previous recent hernia repair. Small amount of postoperative air and soft tissue stranding to the expected degree. No visible abscess.  Sigmoid diverticulosis.  ____________________________________________    FINAL ASSESSMENT AND PLAN  Postoperative pain and swelling next  Plan: Patient with unremarkable CT and lab work. This appears to be postoperative changes in the abdominal wall that are within normal limits. He'll continue outpatient follow-up with Dr. Jamal Collin.    Earleen Newport, MD   Earleen Newport, MD 09/27/14 [phone]

## 2014-09-27 NOTE — ED Notes (Signed)
Lab sent 

## 2014-09-27 NOTE — ED Notes (Signed)
Patient returned from CT

## 2014-09-27 NOTE — Discharge Instructions (Signed)
PATIENT INSTRUCTIONS  HERNIA    FOLLOW-UP:  Please make an appointment with your physician Call your physician immediately if you have any fevers greater than 102.5, drainage from you wound that is not clear or looks infected, persistent bleeding, increasing abdominal pain, problems urinating, or persistent nausea/vomiting.      WOUND CARE INSTRUCTIONS:  Keep a dry clean dressing on the wound if there is drainage. The initial bandage may be removed after 24 hours.  Once the wound has quit draining you may leave it open to air.  If clothing rubs against the wound or causes irritation and the wound is not draining you may cover it with a dry dressing during the daytime.  Try to keep the wound dry and avoid ointments on the wound unless directed to do so.  If the wound becomes bright red and painful or starts to drain infected material that is not clear, please contact your physician immediately.  If the wound is mildly pink and has a thick firm ridge underneath it, this is normal, and is referred to as a healing ridge.  This will resolve over the next 4-6 weeks.    DIET:  You may eat any foods that you can tolerate.  It is a good idea to eat a high fiber diet and take in plenty of fluids to prevent constipation.  If you do become constipated you may want to take a mild laxative or take ducolax tablets on a daily basis until your bowel habits are regular.  Constipation can be very uncomfortable, along with straining, after recent abdominal surgery.    ACTIVITY:  You are encouraged to cough and deep breath or use your incentive spirometer if you were given one, every 15-30 minutes when awake.  This will help prevent respiratory complications and low grade fevers post-operatively.  You may want to hug a pillow when coughing and sneezing to add additional support to the surgical area which will decrease pain during these times.  You are encouraged to walk and engage in light activity for the next two weeks.  You should  not lift more than 20 pounds during this time frame as it could put you at increased risk for a hernia recurrence.  Twenty pounds is roughly equivalent to a plastic bag of groceries.      MEDICATIONS:  Try to take narcotic medications and anti-inflammatory medications, such as tylenol, ibuprofen, naprosyn, etc., with food.  This will minimize stomach upset from the medication.  Should you develop nausea and vomiting from the pain medication, or develop a rash, please discontinue the medication and contact your physician.  You should not drive, make important decisions, or operate machinery when taking narcotic pain medication.    QUESTIONS:  Please feel free to call your physician or the hospital operator if you have any questions, and they will be glad to assist you.

## 2014-09-27 NOTE — ED Notes (Signed)
Patient transported to CT 

## 2014-09-28 ENCOUNTER — Telehealth: Payer: Self-pay | Admitting: *Deleted

## 2014-09-28 NOTE — Telephone Encounter (Signed)
Patient called the office to report that he is feeling better. He thinks that fever broke through the night last night as he woke up this morning and his shirt was soaked. Patient reports he was able to sleep over 8 hours.  This patient will keep follow up appointment as scheduled for this Wednesday, 09-30-14, with Dr. Jamal Collin.

## 2014-09-28 NOTE — Telephone Encounter (Signed)
Per Dr. Jamal Collin, patient was seen over the weekend in the E.R.  Message for patient to call the office.   We need to see if he is feeling better or if he needs to be seen in the office for further follow up.

## 2014-09-30 ENCOUNTER — Encounter: Payer: Self-pay | Admitting: General Surgery

## 2014-09-30 ENCOUNTER — Ambulatory Visit: Payer: Medicare Other | Admitting: General Surgery

## 2014-09-30 ENCOUNTER — Ambulatory Visit (INDEPENDENT_AMBULATORY_CARE_PROVIDER_SITE_OTHER): Payer: Medicare Other | Admitting: General Surgery

## 2014-09-30 VITALS — BP 142/78 | HR 80 | Resp 14 | Ht 72.0 in | Wt 176.0 lb

## 2014-09-30 DIAGNOSIS — K4090 Unilateral inguinal hernia, without obstruction or gangrene, not specified as recurrent: Secondary | ICD-10-CM

## 2014-09-30 NOTE — Progress Notes (Signed)
Here today for postoperative visit, left inguinal hernia repair on 09-23-14. He states the pain is improving gradually. Bowels and urine are regular. He did have fever and chills over the weekend and went to the ER on Sunday and had a CT scan. CT showed only mild postop changes in left groin Left inguinal incision is clean and healing well. Repair is intact. Pt reassured.  Follow up in one month. Gradually increase activity over 2-3 weeks.    PCP:  [facility]

## 2014-09-30 NOTE — Patient Instructions (Addendum)
The patient is aware to call back for any questions or concerns. Follow up in one month. Gradually increase activity over 2-3 weeks.

## 2014-11-03 ENCOUNTER — Encounter: Payer: Self-pay | Admitting: General Surgery

## 2014-11-03 ENCOUNTER — Ambulatory Visit (INDEPENDENT_AMBULATORY_CARE_PROVIDER_SITE_OTHER): Payer: Medicare Other | Admitting: General Surgery

## 2014-11-03 VITALS — BP 144/78 | HR 72 | Resp 14 | Ht 71.0 in | Wt 171.0 lb

## 2014-11-03 DIAGNOSIS — K4090 Unilateral inguinal hernia, without obstruction or gangrene, not specified as recurrent: Secondary | ICD-10-CM

## 2014-11-03 NOTE — Progress Notes (Signed)
Patient ID: Ryan Allison, male   DOB: [birth year], 74 y.o.   MRN: [id number] Patient is here today to follow up for 6 week post op left indirect inguinal hernia repair. Patient states he is doing well, still sore. Bowels working well. No difficulty urinating.   Exam: abdomen soft. Incision well healed in the left groin. No hernia identified.   Follow up: as needed. Call office if pain persists or have new symptoms in the area.No activity restrictions PCP:  Ramonita Lab

## 2014-11-03 NOTE — Patient Instructions (Addendum)
Call office if pain persists or have new symptoms in the area.No activity restrictions

## 2015-04-06 ENCOUNTER — Ambulatory Visit: Payer: Medicare Other | Admitting: Anesthesiology

## 2015-04-06 ENCOUNTER — Encounter
Admission: RE | Disposition: A | Discharge status: Home / Self Care | Payer: Self-pay | Source: Ambulatory Visit | Attending: Gastroenterology

## 2015-04-06 ENCOUNTER — Encounter: Payer: Self-pay | Admitting: Anesthesiology

## 2015-04-06 ENCOUNTER — Ambulatory Visit
Admission: RE | Admit: 2015-04-06 | Discharge: 2015-04-06 | Disposition: A | Discharge status: Home / Self Care | Payer: Medicare Other | Source: Ambulatory Visit | Attending: Gastroenterology | Admitting: Gastroenterology

## 2015-04-06 DIAGNOSIS — K625 Hemorrhage of anus and rectum: Secondary | ICD-10-CM | POA: Diagnosis present

## 2015-04-06 DIAGNOSIS — D123 Benign neoplasm of transverse colon: Secondary | ICD-10-CM | POA: Insufficient documentation

## 2015-04-06 DIAGNOSIS — Z79899 Other long term (current) drug therapy: Secondary | ICD-10-CM | POA: Diagnosis not present

## 2015-04-06 DIAGNOSIS — N400 Enlarged prostate without lower urinary tract symptoms: Secondary | ICD-10-CM | POA: Insufficient documentation

## 2015-04-06 DIAGNOSIS — M1990 Unspecified osteoarthritis, unspecified site: Secondary | ICD-10-CM | POA: Insufficient documentation

## 2015-04-06 DIAGNOSIS — K6289 Other specified diseases of anus and rectum: Secondary | ICD-10-CM | POA: Insufficient documentation

## 2015-04-06 DIAGNOSIS — Z8546 Personal history of malignant neoplasm of prostate: Secondary | ICD-10-CM | POA: Diagnosis not present

## 2015-04-06 DIAGNOSIS — K648 Other hemorrhoids: Secondary | ICD-10-CM | POA: Diagnosis not present

## 2015-04-06 DIAGNOSIS — Z87891 Personal history of nicotine dependence: Secondary | ICD-10-CM | POA: Insufficient documentation

## 2015-04-06 DIAGNOSIS — K5520 Angiodysplasia of colon without hemorrhage: Secondary | ICD-10-CM | POA: Diagnosis not present

## 2015-04-06 DIAGNOSIS — K644 Residual hemorrhoidal skin tags: Secondary | ICD-10-CM | POA: Insufficient documentation

## 2015-04-06 HISTORY — PX: COLONOSCOPY WITH PROPOFOL: SHX5780

## 2015-04-06 SURGERY — COLONOSCOPY WITH PROPOFOL
Anesthesia: General

## 2015-04-06 MED ORDER — PROPOFOL 10 MG/ML IV BOLUS
INTRAVENOUS | Status: DC | PRN
  Administered 2015-04-06: 50 mg via INTRAVENOUS

## 2015-04-06 MED ORDER — SODIUM CHLORIDE 0.9 % IV SOLN
INTRAVENOUS | Status: DC
Start: 2015-04-06 — End: 2015-04-06
  Administered 2015-04-06: 08:00:00 via INTRAVENOUS
  Administered 2015-04-06: 1000 mL via INTRAVENOUS

## 2015-04-06 MED ORDER — SODIUM CHLORIDE 0.9 % IV SOLN: INTRAVENOUS | Status: DC

## 2015-04-06 MED ORDER — FENTANYL CITRATE (PF) 100 MCG/2ML IJ SOLN
INTRAMUSCULAR | Status: DC | PRN
  Administered 2015-04-06: 25 ug via INTRAVENOUS

## 2015-04-06 MED ORDER — PROPOFOL 500 MG/50ML IV EMUL
INTRAVENOUS | Status: DC | PRN
  Administered 2015-04-06: 130 ug/kg/min via INTRAVENOUS

## 2015-04-06 MED ORDER — EPHEDRINE SULFATE 50 MG/ML IJ SOLN
INTRAMUSCULAR | Status: DC | PRN
  Administered 2015-04-06 (×2): 10 mg via INTRAVENOUS

## 2015-04-06 NOTE — Anesthesia Procedure Notes (Signed)
Date/Time: 04/06/2015 8:15 AM Performed by: Allean Found Pre-anesthesia Checklist: Patient identified, Emergency Drugs available, Suction available, Patient being monitored and Timeout performed Patient Re-evaluated:Patient Re-evaluated prior to inductionOxygen Delivery Method: Nasal cannula Intubation Type: IV induction

## 2015-04-06 NOTE — H&P (Signed)
Outpatient short stay form Pre-procedure 04/06/2015 8:08 AM Lollie Sails MD  Primary Physician: Dr. Ramonita Lab  Reason for visit:  Colonoscopy  History of present illness:  Patient is a 74 year old male presenting today for colonoscopy in regards a personal history of rectal bleeding hospitalized once for this without transfusions however hemoglobin in the sevens. His last colonoscopy was in 2012 with a finding of an adenomatous polyp. He tolerated his prep well. He takes no aspirin or NSAID products.    Current facility-administered medications:  .  0.9 %  sodium chloride infusion, , Intravenous, Continuous, Lollie Sails, MD, Last Rate: 20 mL/hr at 04/06/15 0709, 1,000 mL at 04/06/15 0709 .  0.9 %  sodium chloride infusion, , Intravenous, Continuous, Lollie Sails, MD  Prescriptions prior to admission  Medication Sig Dispense Refill Last Dose  . gabapentin (NEURONTIN) 600 MG tablet Take 600 mg by mouth 4 (four) times daily.   Past Week at Unknown time  . omeprazole (PRILOSEC) 40 MG capsule Take 40 mg by mouth daily.   Past Week at Unknown time  . simvastatin (ZOCOR) 20 MG tablet Take 20 mg by mouth daily.   Past Week at Unknown time  . tamsulosin (FLOMAX) 0.4 MG CAPS capsule Take 0.4 mg by mouth 2 (two) times daily.   Past Week at Unknown time  . traMADol (ULTRAM) 50 MG tablet Take 50 mg by mouth every 6 (six) hours as needed for moderate pain.    Past Week at Unknown time     Allergies  Allergen Reactions  . Nsaids     Diverticular bleed  . Venlafaxine Other (See Comments)    sedation  . Celebrex [Celecoxib] Itching and Rash     Past Medical History  Diagnosis Date  . Intestinal bleeding   . Meniscus tear   . Arthritis   . Prostate enlargement   . Cancer [facility]) 2011    prostate    Review of systems:      Physical Exam    Heart and lungs: Regular rate and rhythm without rub or gallop, lungs are bilaterally clear    HEENT: Norm cephalic atraumatic  eyes are anicteric    Other:     Pertinant exam for procedure: Soft nontender nondistended bowel sounds positive normoactive    Planned proceedures: Colonoscopy and indicated procedures I have discussed the risks benefits and complications of procedures to include not limited to bleeding, infection, perforation and the risk of sedation and the patient wishes to proceed.    Lollie Sails, MD Gastroenterology 04/06/2015  8:08 AM

## 2015-04-06 NOTE — Anesthesia Preprocedure Evaluation (Signed)
Anesthesia Evaluation  Patient identified by MRN, date of birth, ID band Patient awake    Reviewed: Allergy & Precautions, H&P , NPO status , Patient's Chart, lab work & pertinent test results  History of Anesthesia Complications Negative for: history of anesthetic complications  Airway Mallampati: III  TM Distance: >3 FB Neck ROM: full    Dental  (+) Poor Dentition, Chipped   Pulmonary neg shortness of breath, former smoker,    Pulmonary exam normal breath sounds clear to auscultation       Cardiovascular Exercise Tolerance: Good (-) angina(-) Past MI and (-) DOE negative cardio ROS Normal cardiovascular exam Rhythm:regular Rate:Normal     Neuro/Psych negative neurological ROS  negative psych ROS   GI/Hepatic negative GI ROS, Neg liver ROS, neg GERD  ,  Endo/Other  negative endocrine ROS  Renal/GU negative Renal ROS  negative genitourinary   Musculoskeletal   Abdominal   Peds  Hematology negative hematology ROS (+)   Anesthesia Other Findings Past Medical History:   Intestinal bleeding                                          Meniscus tear                                                Arthritis                                                    Prostate enlargement                                         Cancer ([place])                                    2011           Comment:prostate  Past Surgical History:   MENISCUS REPAIR                                  2008         VASCULAR SURGERY                                 2015         INGUINAL HERNIA REPAIR                          Left 09/23/2014      Comment:Procedure: HERNIA REPAIR INGUINAL ADULT;                Surgeon: Christene Lye, MD;  Location:               [facility] ORS;  Service: General;  Laterality: Left;   HERNIA REPAIR  Left 09-23-14        Comment:inguinal  BMI    Body Mass Index   24.13 kg/m 2      Reproductive/Obstetrics negative OB ROS                             Anesthesia Physical Anesthesia Plan  ASA: III  Anesthesia Plan: General   Post-op Pain Management:    Induction:   Airway Management Planned:   Additional Equipment:   Intra-op Plan:   Post-operative Plan:   Informed Consent: I have reviewed the patients History and Physical, chart, labs and discussed the procedure including the risks, benefits and alternatives for the proposed anesthesia with the patient or authorized representative who has indicated his/her understanding and acceptance.   Dental Advisory Given  Plan Discussed with: Anesthesiologist, CRNA and Surgeon  Anesthesia Plan Comments:         Anesthesia Quick Evaluation

## 2015-04-06 NOTE — Transfer of Care (Signed)
Immediate Anesthesia Transfer of Care Note  Patient: Ryan Allison  Procedure(s) Performed: Procedure(s): COLONOSCOPY WITH PROPOFOL (N/A)  Patient Location: PACU  Anesthesia Type:General  Level of Consciousness: sedated  Airway & Oxygen Therapy: Patient Spontanous Breathing and Patient connected to nasal cannula oxygen  Post-op Assessment: Report given to RN and Post -op Vital signs reviewed and stable  Post vital signs: Reviewed and stable  Last Vitals:  Filed Vitals:   04/06/15 0705 04/06/15 0901  BP: 135/63 100/71  Pulse: 82 101  Temp: 36.3 C 36.1 C  Resp: 16 7    Complications: No apparent anesthesia complications

## 2015-04-06 NOTE — Anesthesia Postprocedure Evaluation (Signed)
Anesthesia Post Note  Patient: Ryan Allison  Procedure(s) Performed: Procedure(s) (LRB): COLONOSCOPY WITH PROPOFOL (N/A)  Patient location during evaluation: Endoscopy Anesthesia Type: General Level of consciousness: awake and alert Pain management: pain level controlled Vital Signs Assessment: post-procedure vital signs reviewed and stable Respiratory status: spontaneous breathing, nonlabored ventilation, respiratory function stable and patient connected to nasal cannula oxygen Cardiovascular status: blood pressure returned to baseline and stable Postop Assessment: No signs of nausea or vomiting Anesthetic complications: no    Last Vitals:  Filed Vitals:   04/06/15 0705 04/06/15 0901  BP: 135/63 100/71  Pulse: 82 101  Temp: 36.3 C 36.1 C  Resp: 16 7    Last Pain:  Filed Vitals:   04/06/15 0955  PainSc: 0-No pain                 Precious Haws Madgie Dhaliwal

## 2015-04-06 NOTE — Op Note (Signed)
[facility] Gastroenterology Patient Name: Ryan Allison Procedure Date: 04/06/2015 8:07 AM MRN: [id number] Account #: [id number] Date of Birth: [birth year] Admit Type: Outpatient Age: 74 Room: [facility] ENDO ROOM 3 Gender: Male Note Status: Finalized Procedure:         Colonoscopy Indications:       Rectal bleeding, Personal history of colonic polyps Providers:         Lollie Sails, MD Referring MD:      Ramonita Lab, MD (Referring MD) Medicines:         Monitored Anesthesia Care Complications:     No immediate complications. Procedure:         Pre-Anesthesia Assessment:                    - ASA Grade Assessment: III - A patient with severe                     systemic disease.                    After obtaining informed consent, the colonoscope was                     passed under direct vision. Throughout the procedure, the                     patient's blood pressure, pulse, and oxygen saturations                     were monitored continuously. The Colonoscope was                     introduced through the anus and advanced to the the cecum,                     identified by appendiceal orifice and ileocecal valve. The                     colonoscopy was performed without difficulty. The patient                     tolerated the procedure well. Findings:      Multiple medium-mouthed diverticula were found in the entire colon.      A single medium-mouthed diverticulum was found at the hepatic flexure.       There was a smal spot of ecchymosis at this site, with minimal       inflamation.      A polyp was found in the distal transverse colon. The polyp was flat.       The polyp was removed with a cold biopsy forceps. Resection and       retrieval were complete.      Multiple small patchy angioectasias without bleeding were found in the       distal rectum.      Non-bleeding internal hemorrhoids were found during retroflexion and       during anoscopy. The  hemorrhoids were medium-sized.      The digital rectal exam was normal otherwise.      Non-bleeding external hemorrhoids were found during perianal exam. The       hemorrhoids were small. Impression:        - Diverticulosis in the entire examined colon.                    -  Diverticulosis at the hepatic flexure.                    - One polyp in the distal transverse colon. Resected and                     retrieved.                    - Multiple non-bleeding colonic angioectasias.                    - Non-bleeding internal hemorrhoids.                    - Non-bleeding external hemorrhoids. Recommendation:    - Await pathology results.                    - Use Citrucel one tablespoon PO daily daily.                    - Return to GI office in 6 weeks. Procedure Code(s): --- Professional ---                    [phone], Colonoscopy, flexible; with biopsy, single or                     multiple Diagnosis Code(s): --- Professional ---                    K64.4, Residual hemorrhoidal skin tags                    K64.8, Other hemorrhoids                    D12.3, Benign neoplasm of transverse colon                    K55.20, Angiodysplasia of colon without hemorrhage                    K62.5, Hemorrhage of anus and rectum                    Z86.010, Personal history of colonic polyps                    K57.30, Diverticulosis of large intestine without                     perforation or abscess without bleeding CPT copyright 2014 American [employer]. All rights reserved. The codes documented in this report are preliminary and upon coder review may  be revised to meet current compliance requirements. Lollie Sails, MD 04/06/2015 8:55:43 AM This report has been signed electronically. Number of Addenda: 0 Note Initiated On: 04/06/2015 8:07 AM Scope Withdrawal Time: 0 hours 12 minutes 53 seconds  Total Procedure Duration: 0 hours 26 minutes 40 seconds       [facility]

## 2015-04-07 LAB — SURGICAL PATHOLOGY

## 2015-10-21 DIAGNOSIS — M5136 Other intervertebral disc degeneration, lumbar region: Secondary | ICD-10-CM | POA: Insufficient documentation

## 2015-10-21 DIAGNOSIS — I700 Atherosclerosis of aorta: Secondary | ICD-10-CM | POA: Insufficient documentation

## 2016-01-25 IMAGING — CT CT ABD-PELV W/ CM
1 of 3 series · 13 of 32 positions shown, 19 images · IV contrast (omnipaque)
Comparison: CT scan dated 06/06/2013

CLINICAL DATA: Left lower quadrant abdominal pain with fever and
chills. The patient had left inguinal hernia surgery 4 days ago.

EXAM:
CT ABDOMEN AND PELVIS WITH CONTRAST
TECHNIQUE: Multidetector CT imaging of the abdomen and pelvis was performed
using the standard protocol following bolus administration of
intravenous contrast.
CONTRAST:  100mL OMNIPAQUE IOHEXOL 300 MG/ML  SOLN

[Series 2: routine abd pel with · axial · 0.70mm/px · z∈[-474,-54]mm · 13 of 100 slices shown, 19 images]
[im 8/100  soft-tissue]
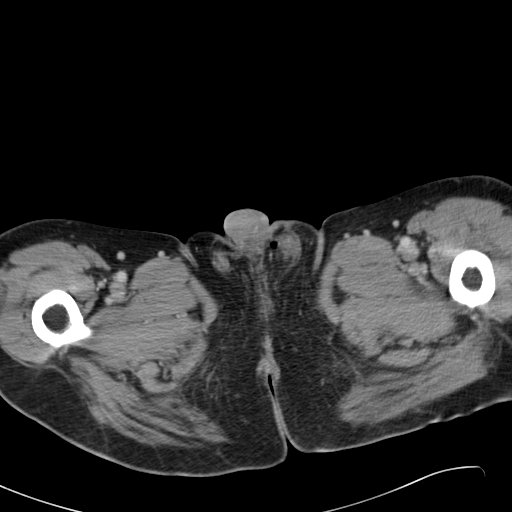
[im 8/100  bone]
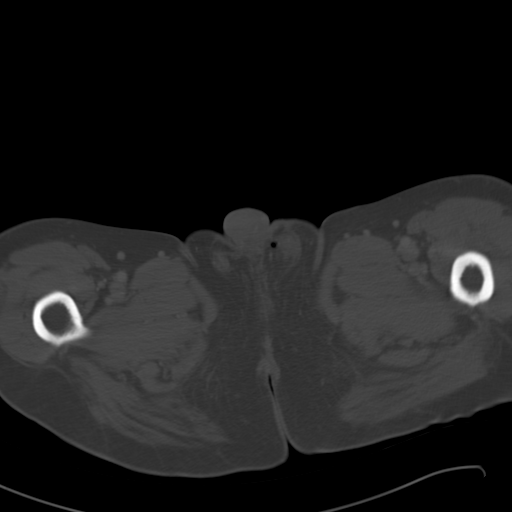
[im 15/100  soft-tissue]
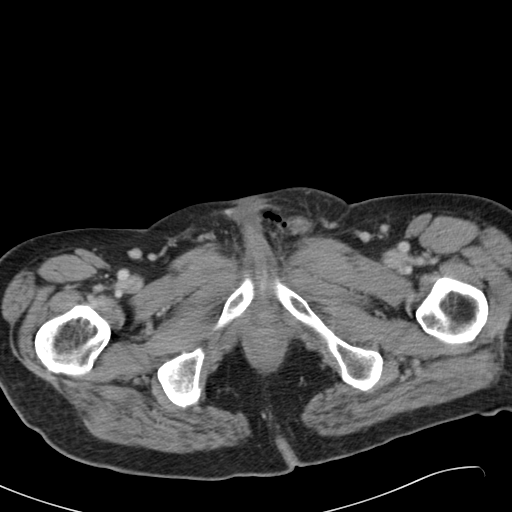
[im 22/100  soft-tissue]
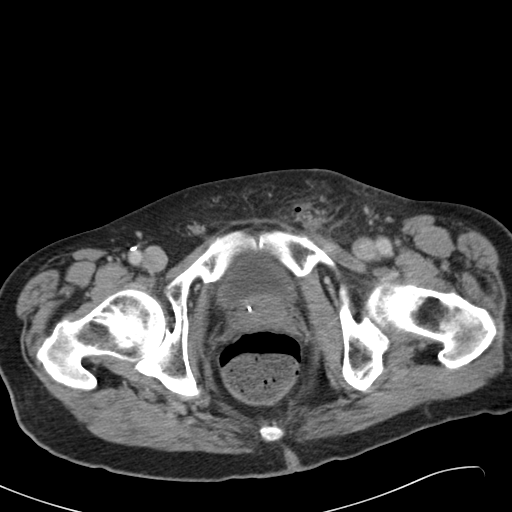
[im 29/100  soft-tissue]
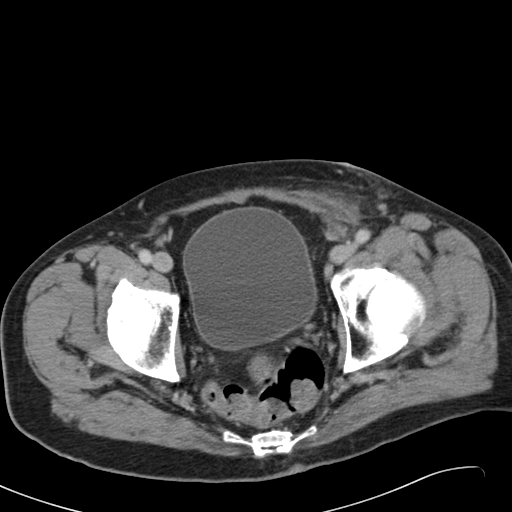
[im 36/100  soft-tissue]
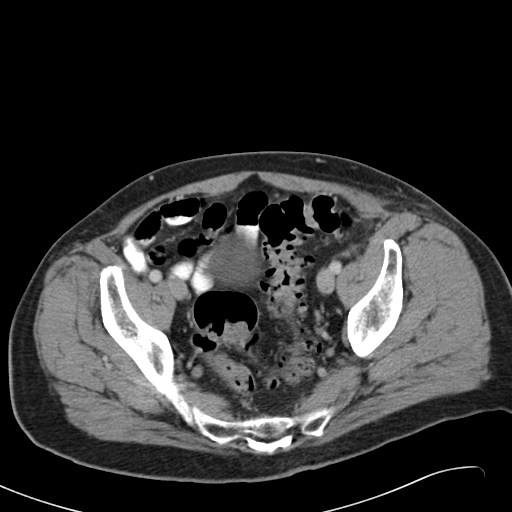
[im 43/100  soft-tissue]
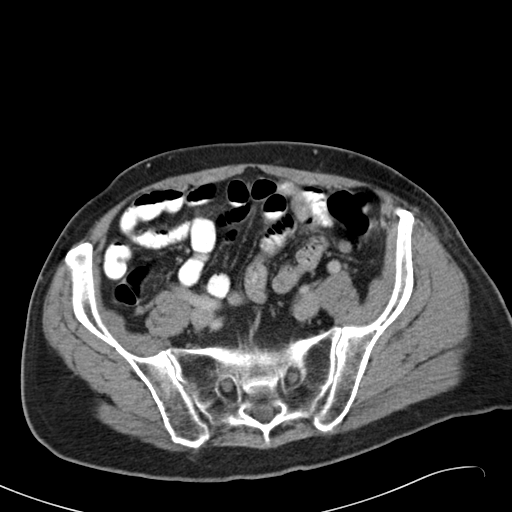
[im 50/100  soft-tissue]
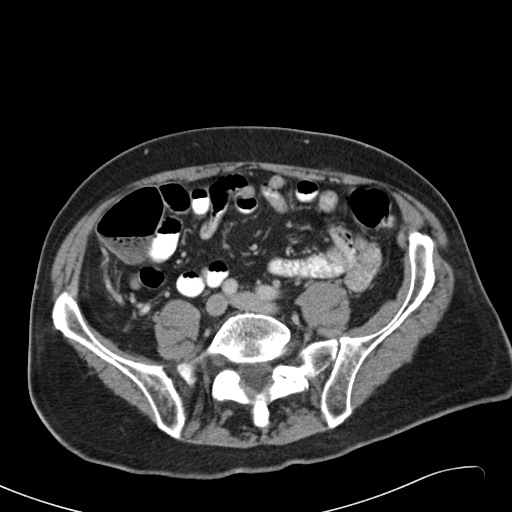
[im 57/100  soft-tissue]
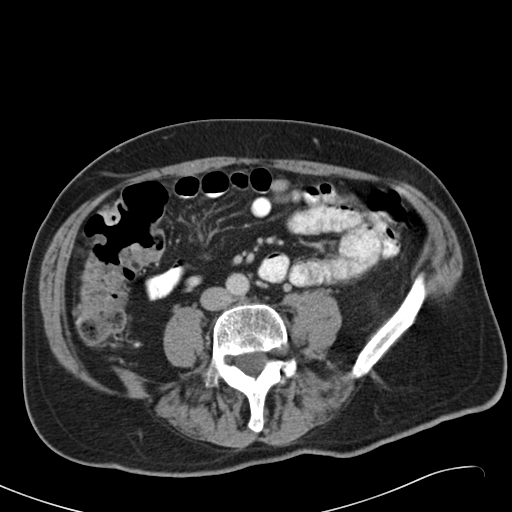
[im 64/100  soft-tissue]
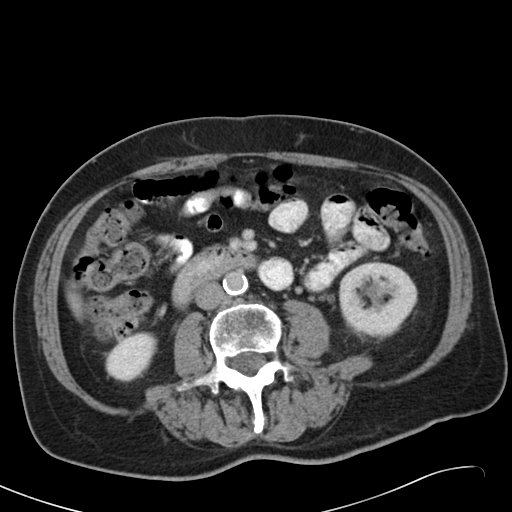
[im 64/100  bone]
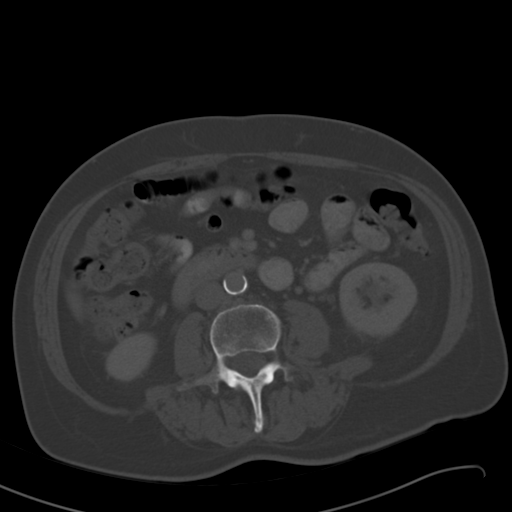
[im 71/100  soft-tissue]
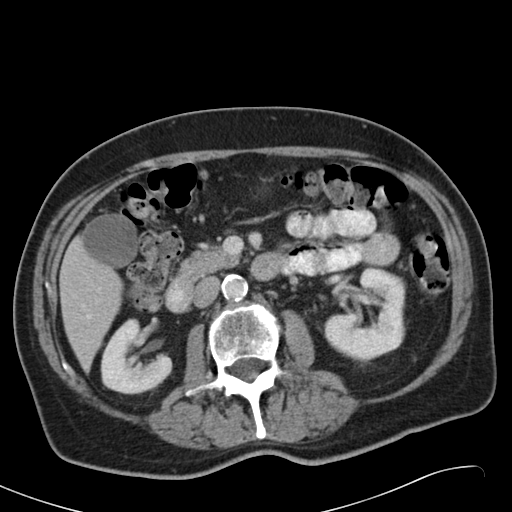
[im 71/100  lung]
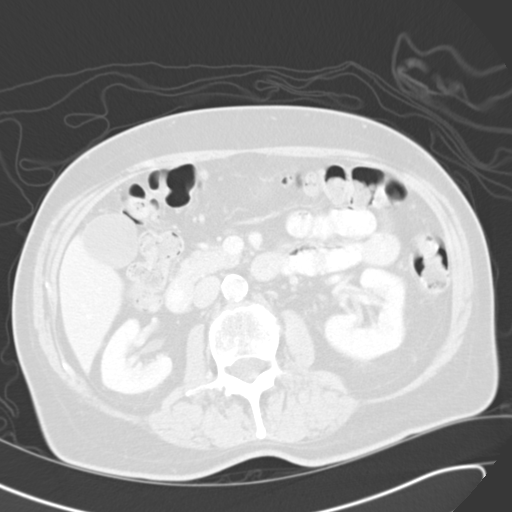
[im 78/100  soft-tissue]
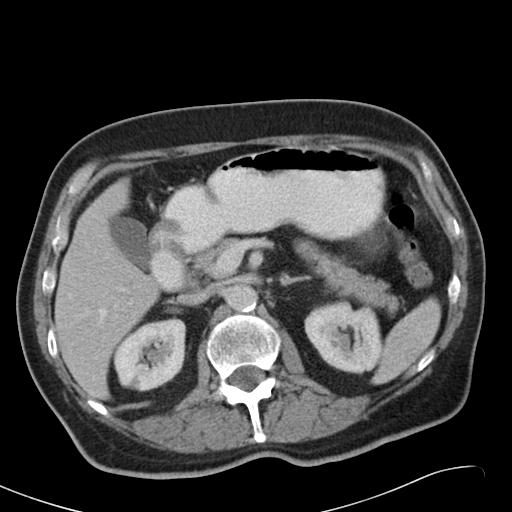
[im 78/100  lung]
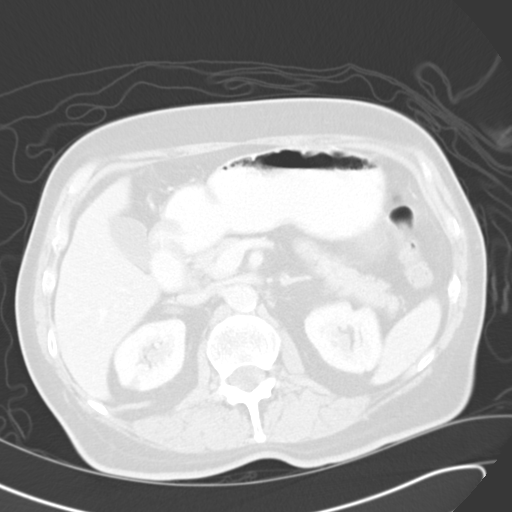
[im 85/100  soft-tissue]
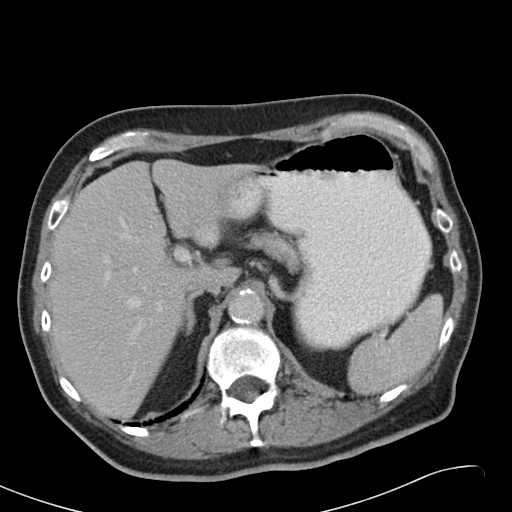
[im 85/100  lung]
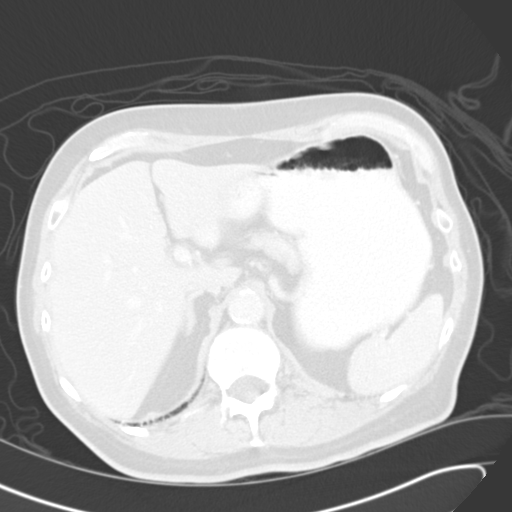
[im 92/100  soft-tissue]
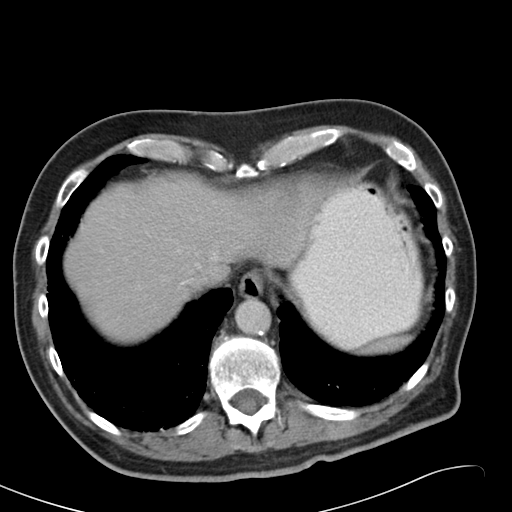
[im 92/100  lung]
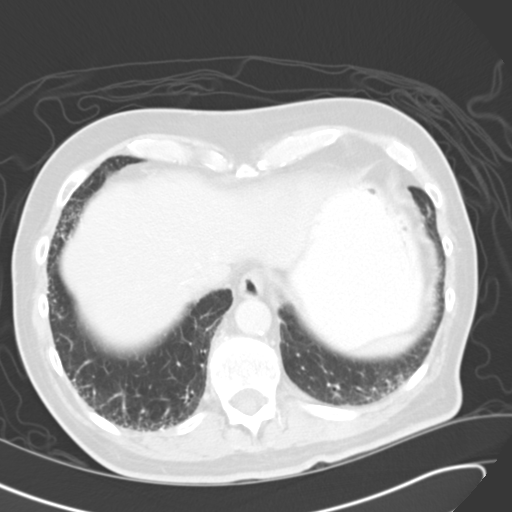

[13 of 32 positions shown; findings below may reference images not displayed]

FINDINGS: There are postoperative changes in the left inguinal region from the
recent hernia repair including some air in the soft tissues. There
is no abscess. The soft tissue stranding is consistent with recent
surgery.

The liver, biliary tree, spleen, pancreas, adrenal glands, and
kidneys demonstrate no acute abnormalities. There are multiple
stones in the lower pole the right kidney and there is a septated
cyst in the lower pole of the left kidney as well as small cortical
cyst in the upper pole of the right kidney, all unchanged since
06/06/2013.

The patient has extensive sigmoid diverticulosis with no evidence of
diverticulitis. Bladder is normal. Prostate gland is not enlarged.
Biopsy markers are noted in the prostate gland.

Terminal ileum and appendix are normal. No adenopathy or free air or
free fluid. No acute osseous abnormality. Fairly extensive
calcification in the abdominal aorta. Heart size is normal.
IMPRESSION: Postsurgical changes in the left inguinal region from previous
recent hernia repair. Small amount of postoperative air and soft
tissue stranding to the expected degree. No visible abscess.

Sigmoid diverticulosis.

## 2016-04-03 DIAGNOSIS — C61 Malignant neoplasm of prostate: Secondary | ICD-10-CM | POA: Insufficient documentation

## 2016-05-06 ENCOUNTER — Encounter: Payer: Self-pay | Admitting: Emergency Medicine

## 2016-05-06 ENCOUNTER — Observation Stay
Admission: EM | Admit: 2016-05-06 | Discharge: 2016-05-07 | Disposition: A | Discharge status: Home / Self Care | Payer: Medicare Other | Attending: Internal Medicine | Admitting: Internal Medicine

## 2016-05-06 DIAGNOSIS — Z8719 Personal history of other diseases of the digestive system: Secondary | ICD-10-CM | POA: Insufficient documentation

## 2016-05-06 DIAGNOSIS — Z886 Allergy status to analgesic agent status: Secondary | ICD-10-CM | POA: Insufficient documentation

## 2016-05-06 DIAGNOSIS — E785 Hyperlipidemia, unspecified: Secondary | ICD-10-CM | POA: Diagnosis not present

## 2016-05-06 DIAGNOSIS — Z923 Personal history of irradiation: Secondary | ICD-10-CM | POA: Insufficient documentation

## 2016-05-06 DIAGNOSIS — K922 Gastrointestinal hemorrhage, unspecified: Principal | ICD-10-CM

## 2016-05-06 DIAGNOSIS — N400 Enlarged prostate without lower urinary tract symptoms: Secondary | ICD-10-CM | POA: Insufficient documentation

## 2016-05-06 DIAGNOSIS — Z9889 Other specified postprocedural states: Secondary | ICD-10-CM | POA: Insufficient documentation

## 2016-05-06 DIAGNOSIS — Z888 Allergy status to other drugs, medicaments and biological substances status: Secondary | ICD-10-CM | POA: Diagnosis not present

## 2016-05-06 DIAGNOSIS — Z79899 Other long term (current) drug therapy: Secondary | ICD-10-CM | POA: Insufficient documentation

## 2016-05-06 DIAGNOSIS — Z8546 Personal history of malignant neoplasm of prostate: Secondary | ICD-10-CM | POA: Diagnosis not present

## 2016-05-06 DIAGNOSIS — Z87891 Personal history of nicotine dependence: Secondary | ICD-10-CM | POA: Diagnosis not present

## 2016-05-06 HISTORY — DX: Hyperlipidemia, unspecified: E78.5

## 2016-05-06 LAB — COMPREHENSIVE METABOLIC PANEL
ALK PHOS: 67 U/L (ref 38–126)
ALT: 12 U/L — AB (ref 17–63)
AST: 20 U/L (ref 15–41)
Albumin: 3.9 g/dL (ref 3.5–5.0)
Anion gap: 9 (ref 5–15)
BILIRUBIN TOTAL: 0.9 mg/dL (ref 0.3–1.2)
BUN: 25 mg/dL — AB (ref 6–20)
CHLORIDE: 104 mmol/L (ref 101–111)
CO2: 25 mmol/L (ref 22–32)
CREATININE: 0.94 mg/dL (ref 0.61–1.24)
Calcium: 8.8 mg/dL — ABNORMAL LOW (ref 8.9–10.3)
GFR calc Af Amer: >60 mL/min (ref >60)
Glucose, Bld: 130 mg/dL — ABNORMAL HIGH (ref 65–99)
Potassium: 4.2 mmol/L (ref 3.5–5.1)
Sodium: 138 mmol/L (ref 135–145)
TOTAL PROTEIN: 6.9 g/dL (ref 6.5–8.1)

## 2016-05-06 LAB — TYPE AND SCREEN
ABO/RH(D): O NEG
Antibody Screen: NEGATIVE

## 2016-05-06 LAB — CBC
HCT: 36.3 % — ABNORMAL LOW (ref 40.0–52.0)
Hemoglobin: 12.5 g/dL — ABNORMAL LOW (ref 13.0–18.0)
MCH: 32.8 pg (ref 26.0–34.0)
MCHC: 34.4 g/dL (ref 32.0–36.0)
MCV: 95.3 fL (ref 80.0–100.0)
PLATELETS: 245 10*3/uL (ref 150–440)
RBC: 3.81 MIL/uL — ABNORMAL LOW (ref 4.40–5.90)
RDW: 15.2 % — AB (ref 11.5–14.5)
WBC: 9.8 10*3/uL (ref 3.8–10.6)

## 2016-05-06 MED ORDER — GABAPENTIN 300 MG PO CAPS
600.0000 mg | ORAL_CAPSULE | Freq: Four times a day (QID) | ORAL | Status: DC
  Administered 2016-05-06 – 2016-05-07 (×2): 600 mg via ORAL
  Filled 2016-05-06 (×2): qty 2

## 2016-05-06 MED ORDER — SIMVASTATIN 20 MG PO TABS
20.0000 mg | ORAL_TABLET | Freq: Every day | ORAL | Status: DC
  Administered 2016-05-06 – 2016-05-07 (×2): 20 mg via ORAL
  Filled 2016-05-06 (×2): qty 1

## 2016-05-06 MED ORDER — TRAMADOL HCL 50 MG PO TABS
50.0000 mg | ORAL_TABLET | Freq: Four times a day (QID) | ORAL | Status: DC | PRN
  Administered 2016-05-06 – 2016-05-07 (×2): 50 mg via ORAL
  Filled 2016-05-06 (×2): qty 1

## 2016-05-06 MED ORDER — SODIUM CHLORIDE 0.9 % IV BOLUS (SEPSIS)
1000.0000 mL | Freq: Once | INTRAVENOUS | Status: AC
  Administered 2016-05-06: 1000 mL via INTRAVENOUS

## 2016-05-06 MED ORDER — TAMSULOSIN HCL 0.4 MG PO CAPS
0.4000 mg | ORAL_CAPSULE | Freq: Two times a day (BID) | ORAL | Status: DC
  Administered 2016-05-06 – 2016-05-07 (×2): 0.4 mg via ORAL
  Filled 2016-05-06 (×2): qty 1

## 2016-05-06 MED ORDER — PANTOPRAZOLE SODIUM 40 MG IV SOLR
40.0000 mg | Freq: Once | INTRAVENOUS | Status: AC
  Administered 2016-05-06: 40 mg via INTRAVENOUS
  Filled 2016-05-06: qty 40

## 2016-05-06 MED ORDER — PANTOPRAZOLE SODIUM 40 MG IV SOLR
40.0000 mg | Freq: Two times a day (BID) | INTRAVENOUS | Status: DC
  Administered 2016-05-07: 40 mg via INTRAVENOUS
  Filled 2016-05-06: qty 40

## 2016-05-06 NOTE — ED Notes (Signed)
ED Provider at bedside. 

## 2016-05-06 NOTE — Progress Notes (Signed)
Family Meeting Note  Advance Directive:yes  Today a meeting took place with the Patient.  The following clinical team members were present during this meeting:MD  The following were discussed:Patient's diagnosis: recurrent diverticular bleed  , Patient's progosis: Unable to determine and Goals for treatment: Full Code  Additional follow-up to be provided: GI follow up.  Time spent during discussion:20 minutes  Ryan Allison, Rosalio Macadamia, MD

## 2016-05-06 NOTE — ED Provider Notes (Signed)
[facility] Emergency Department Provider Note   ____________________________________________   I have reviewed the triage vital signs and the nursing notes.   HISTORY  Chief Complaint Diverticulitis and GI Bleeding   History limited by: Not Limited   HPI Ryan Allison is a 75 y.o. male who presents to the emergency department today because of concerns for GI bleed. Patient states that it started tonight. He has had 3 bowel movements consisting of a large amount of blood. He says since the date darker red. Associated with the bleeding the patient had the feeling of being lightheaded. He felt very weak. Felt like he was given a pass out. He does have a history of significant GI bleeds in the past having required an embolization. He is followed by Dr. Jaquita Rector with GI.   Past Medical History:  Diagnosis Date  . Arthritis   . Cancer [facility]) 2011   prostate  . Intestinal bleeding   . Meniscus tear   . Prostate enlargement     There are no active problems to display for this patient.   Past Surgical History:  Procedure Laterality Date  . COLONOSCOPY WITH PROPOFOL N/A 04/06/2015   Procedure: COLONOSCOPY WITH PROPOFOL;  Surgeon: Lollie Sails, MD;  Location: [facility] ENDOSCOPY;  Service: Endoscopy;  Laterality: N/A;  . HERNIA REPAIR Left 09-23-14   inguinal  . INGUINAL HERNIA REPAIR Left 09/23/2014   Procedure: HERNIA REPAIR INGUINAL ADULT;  Surgeon: Christene Lye, MD;  Location: [facility] ORS;  Service: General;  Laterality: Left;  . MENISCUS REPAIR  2008  . VASCULAR SURGERY  2015    Prior to Admission medications   Medication Sig Start Date End Date Taking? Authorizing Provider  gabapentin (NEURONTIN) 600 MG tablet Take 600 mg by mouth 4 (four) times daily.    Historical Provider, MD  omeprazole (PRILOSEC) 40 MG capsule Take 40 mg by mouth daily.    Historical Provider, MD  simvastatin (ZOCOR) 20 MG tablet Take 20 mg by mouth daily.    Historical  Provider, MD  tamsulosin (FLOMAX) 0.4 MG CAPS capsule Take 0.4 mg by mouth 2 (two) times daily.    Historical Provider, MD  traMADol (ULTRAM) 50 MG tablet Take 50 mg by mouth every 6 (six) hours as needed for moderate pain.     Historical Provider, MD    Allergies Nsaids; Venlafaxine; and Celebrex [celecoxib]  Family History  Problem Relation Age of Onset  . Cancer Father   . Cancer Brother     Social History Social History  Substance Use Topics  . Smoking status: Former Research scientist (life sciences)  . Smokeless tobacco: Never Used  . Alcohol use No    Review of Systems  Constitutional: Negative for fever. Positive for weakness.  Cardiovascular: Negative for chest pain. Respiratory: Negative for shortness of breath. Gastrointestinal: Positive for gi bleed.  Genitourinary: Negative for dysuria. Musculoskeletal: Negative for back pain. Skin: Negative for rash. Neurological: Negative for headaches, focal weakness or numbness.  10-point ROS otherwise negative.  ____________________________________________   PHYSICAL EXAM:  VITAL SIGNS: ED Triage Vitals  Enc Vitals Group     BP 05/06/16 1928 132/72     Pulse Rate 05/06/16 1928 88     Resp 05/06/16 1928 (!) 24     Temp 05/06/16 1928 97.9 F (36.6 C)     Temp Source 05/06/16 1928 Oral     SpO2 05/06/16 1928 100 %     Weight 05/06/16 1924 183 lb (83 kg)  Height 05/06/16 1924 6' (1.829 m)     Head Circumference --      Peak Flow --      Pain Score 05/06/16 1924 5   Constitutional: Alert and oriented. Well appearing and in no distress. Eyes: Conjunctivae are normal. Normal extraocular movements. ENT   Head: Normocephalic and atraumatic.   Nose: No congestion/rhinnorhea.   Mouth/Throat: Mucous membranes are moist.   Neck: No stridor. Hematological/Lymphatic/Immunilogical: No cervical lymphadenopathy. Cardiovascular: Normal rate, regular rhythm.  No murmurs, rubs, or gallops.  Respiratory: Normal respiratory effort  without tachypnea nor retractions. Breath sounds are clear and equal bilaterally. No wheezes/rales/rhonchi. Gastrointestinal: Soft and non tender. No rebound. No guarding.  Genitourinary: Deferred Musculoskeletal: Normal range of motion in all extremities. No lower extremity edema. Neurologic:  Normal speech and language. No gross focal neurologic deficits are appreciated.  Skin:  Skin is warm, dry and intact. No rash noted. Psychiatric: Mood and affect are normal. Speech and behavior are normal. Patient exhibits appropriate insight and judgment.  ____________________________________________    LABS (pertinent positives/negatives)  Labs Reviewed  COMPREHENSIVE METABOLIC PANEL - Abnormal; Notable for the following:       Result Value   Glucose, Bld 130 (*)    BUN 25 (*)    Calcium 8.8 (*)    ALT 12 (*)    All other components within normal limits  CBC - Abnormal; Notable for the following:    RBC 3.81 (*)    Hemoglobin 12.5 (*)    HCT 36.3 (*)    RDW 15.2 (*)    All other components within normal limits  CBC  POC OCCULT BLOOD, ED  TYPE AND SCREEN     ____________________________________________   EKG  I, Nance Pear, attending physician, personally viewed and interpreted this EKG  EKG Time: 1929 Rate: 92 Rhythm: normal sinus rhythm Axis: left axis deviation Intervals: qtc 480 QRS: incomplete RBBB, LAFB ST changes: no st elevation Impression: abnormal ekg   ____________________________________________    RADIOLOGY  None  ____________________________________________   PROCEDURES  Procedures  ____________________________________________   INITIAL IMPRESSION / ASSESSMENT AND PLAN / ED COURSE  Pertinent labs & imaging results that were available during my care of the patient were reviewed by me and considered in my medical decision making (see chart for details).  Patient presented to the emergency department today because of concern for GI bleed.  The patient has a history of significant gi bleed in the past. Vital signs here within normal limits. Hemoglobin 12.5. Given clinical story will plan on admission to the hospitalist service.   ____________________________________________   FINAL CLINICAL IMPRESSION(S) / ED DIAGNOSES  Final diagnoses:  Gastrointestinal hemorrhage, unspecified gastrointestinal hemorrhage type     Note: This dictation was prepared with Dragon dictation. Any transcriptional errors that result from this process are unintentional     Nance Pear, MD 05/06/16 2110

## 2016-05-06 NOTE — ED Triage Notes (Signed)
Patient reports that he has a long history of diverticulitis with bleeding. Patient reports that he has had 3 bloody stools this evening. Patient pale and states that he feels like he is going to pass out.

## 2016-05-06 NOTE — ED Notes (Signed)
Pt transported to room 211

## 2016-05-06 NOTE — H&P (Signed)
[facility] [place] NAME: Ryan Allison    MR#:  [id number]  DATE OF BIRTH:  1941/05/08  DATE OF ADMISSION:  05/06/2016  PRIMARY CARE PHYSICIAN: Adin Hector, MD   REQUESTING/REFERRING PHYSICIAN: Paduchowski  CHIEF COMPLAINT:   Chief Complaint  Patient presents with  . Diverticulitis  . GI Bleeding    HISTORY OF PRESENT ILLNESS: Ryan Allison  is a 75 y.o. male with a known history of Hyperlipidemia, prostate cancer status post radiation, recurrent diverticular bleeding the past- today around 5 PM started having bowel movement with bright red blood and also passing some clots along with that. He had total 3 bowel movements like this at home and after that he felt very cold, clammy, sweating, weak-so came to emergency room. In ER his hemoglobin and vitals were stable but he had this history of recurrent diverticular bleed so he is given as admission. He denies any associated abdominal pain, nausea or vomiting.  PAST MEDICAL HISTORY:   Past Medical History:  Diagnosis Date  . Arthritis   . Cancer [facility]) 2011   prostate  . Hyperlipidemia   . Intestinal bleeding   . Meniscus tear   . Prostate enlargement     PAST SURGICAL HISTORY: Past Surgical History:  Procedure Laterality Date  . COLONOSCOPY WITH PROPOFOL N/A 04/06/2015   Procedure: COLONOSCOPY WITH PROPOFOL;  Surgeon: Lollie Sails, MD;  Location: [facility] ENDOSCOPY;  Service: Endoscopy;  Laterality: N/A;  . HERNIA REPAIR Left 09-23-14   inguinal  . INGUINAL HERNIA REPAIR Left 09/23/2014   Procedure: HERNIA REPAIR INGUINAL ADULT;  Surgeon: Christene Lye, MD;  Location: [facility] ORS;  Service: General;  Laterality: Left;  . MENISCUS REPAIR  2008  . VASCULAR SURGERY  2015    SOCIAL HISTORY:  Social History  Substance Use Topics  . Smoking status: Former Research scientist (life sciences)  . Smokeless tobacco: Never Used  . Alcohol use No    FAMILY HISTORY:  Family History  Problem Relation Age  of Onset  . Cancer Father   . Cancer Brother     DRUG ALLERGIES:  Allergies  Allergen Reactions  . Nsaids     Diverticular bleed  . Venlafaxine Other (See Comments)    sedation  . Celebrex [Celecoxib] Itching and Rash    REVIEW OF SYSTEMS:   CONSTITUTIONAL: No fever, fatigue or weakness.  EYES: No blurred or double vision.  EARS, NOSE, AND THROAT: No tinnitus or ear pain.  RESPIRATORY: No cough, shortness of breath, wheezing or hemoptysis.  CARDIOVASCULAR: No chest pain, orthopnea, edema.  GASTROINTESTINAL: No nausea, vomiting, diarrhea or abdominal pain.  GENITOURINARY: No dysuria, hematuria.  ENDOCRINE: No polyuria, nocturia,  HEMATOLOGY: No anemia, easy bruising or bleeding SKIN: No rash or lesion. MUSCULOSKELETAL: No joint pain or arthritis.   NEUROLOGIC: No tingling, numbness, weakness.  PSYCHIATRY: No anxiety or depression.   MEDICATIONS AT HOME:  Prior to Admission medications   Medication Sig Start Date End Date Taking? Authorizing Provider  gabapentin (NEURONTIN) 600 MG tablet Take 600 mg by mouth 4 (four) times daily.    Historical Provider, MD  omeprazole (PRILOSEC) 40 MG capsule Take 40 mg by mouth daily.    Historical Provider, MD  simvastatin (ZOCOR) 20 MG tablet Take 20 mg by mouth daily.    Historical Provider, MD  tamsulosin (FLOMAX) 0.4 MG CAPS capsule Take 0.4 mg by mouth 2 (two) times daily.    Historical Provider, MD  traMADol Veatrice Bourbon) 50  MG tablet Take 50 mg by mouth every 6 (six) hours as needed for moderate pain.     Historical Provider, MD      PHYSICAL EXAMINATION:   VITAL SIGNS: Blood pressure 128/64, pulse 68, temperature 97.9 F (36.6 C), temperature source Oral, resp. rate 18, height 6' (1.829 m), weight 83 kg (183 lb), SpO2 99 %.  GENERAL:  75 y.o.-year-old patient lying in the bed with no acute distress.  EYES: Pupils equal, round, reactive to light and accommodation. No scleral icterus. Extraocular muscles intact.  HEENT: Head  atraumatic, normocephalic. Oropharynx and nasopharynx clear.  NECK:  Supple, no jugular venous distention. No thyroid enlargement, no tenderness.  LUNGS: Normal breath sounds bilaterally, no wheezing, rales,rhonchi or crepitation. No use of accessory muscles of respiration.  CARDIOVASCULAR: S1, S2 normal. No murmurs, rubs, or gallops.  ABDOMEN: Soft, nontender, nondistended. Bowel sounds present. No organomegaly or mass.  EXTREMITIES: No pedal edema, cyanosis, or clubbing.  NEUROLOGIC: Cranial nerves II through XII are intact. Muscle strength 5/5 in all extremities. Sensation intact. Gait not checked.  PSYCHIATRIC: The patient is alert and oriented x 3.  SKIN: No obvious rash, lesion, or ulcer.   LABORATORY PANEL:   CBC  Recent Labs Lab 05/06/16 1928  WBC 9.8  HGB 12.5*  HCT 36.3*  PLT 245  MCV 95.3  MCH 32.8  MCHC 34.4  RDW 15.2*   ------------------------------------------------------------------------------------------------------------------  Chemistries   Recent Labs Lab 05/06/16 1928  NA 138  K 4.2  CL 104  CO2 25  GLUCOSE 130*  BUN 25*  CREATININE 0.94  CALCIUM 8.8*  AST 20  ALT 12*  ALKPHOS 67  BILITOT 0.9   ------------------------------------------------------------------------------------------------------------------ estimated creatinine clearance is 76.8 mL/min (by C-G formula based on SCr of 0.94 mg/dL). ------------------------------------------------------------------------------------------------------------------ No results for input(s): TSH, T4TOTAL, T3FREE, THYROIDAB in the last 72 hours.  Invalid input(s): FREET3   Coagulation profile No results for input(s): INR, PROTIME in the last 168 hours. ------------------------------------------------------------------------------------------------------------------- No results for input(s): DDIMER in the last 72  hours. -------------------------------------------------------------------------------------------------------------------  Cardiac Enzymes No results for input(s): CKMB, TROPONINI, MYOGLOBIN in the last 168 hours.  Invalid input(s): CK ------------------------------------------------------------------------------------------------------------------ Invalid input(s): POCBNP  ---------------------------------------------------------------------------------------------------------------  Urinalysis    Component Value Date/Time   COLORURINE STRAW (A) 09/27/2014 1739   APPEARANCEUR CLEAR (A) 09/27/2014 1739   LABSPEC 1.012 09/27/2014 1739   PHURINE 6.0 09/27/2014 1739   GLUCOSEU NEGATIVE 09/27/2014 1739   HGBUR NEGATIVE 09/27/2014 1739   BILIRUBINUR NEGATIVE 09/27/2014 1739   KETONESUR 1+ (A) 09/27/2014 1739   PROTEINUR NEGATIVE 09/27/2014 1739   NITRITE NEGATIVE 09/27/2014 1739   LEUKOCYTESUR NEGATIVE 09/27/2014 1739     RADIOLOGY: No results found.  EKG: Orders placed or performed during the hospital encounter of 05/06/16  . EKG 12-Lead  . EKG 12-Lead  . EKG 12-Lead  . EKG 12-Lead    IMPRESSION AND PLAN:  * GI bleed   Likely diverticular.   Currently hemoglobin is stable, continue monitoring.   IV Protonix twice a day. Keep on clear liquid diet.   If rebleed again we may need to check his hemoglobin and may also require to have a bleeding scan in emergent situation.   Currently I will not call GI consult but a few bleed more he may benefit from getting GI involved.  * Hyperlipidemia   Continue simvastatin.  * History of prostate cancer and BPH.   Continue Flomax.   All the records are reviewed and case discussed with ED provider. Management  plans discussed with the patient, family and they are in agreement.  CODE STATUS: Full code  Code Status History    This patient does not have a recorded code status. Please follow your organizational policy for patients  in this situation.     Patient's neighbor and a good friend was present in the room during my visit and he approved discussing the plan in her presence. Patient's healthcare power of attorney is his son and he wanted to be a full code in any adverse event.  TOTAL TIME TAKING CARE OF THIS PATIENT: 50 minutes.    Vaughan Basta M.D on 05/06/2016   Between 7am to 6pm - Pager - [phone]  After 6pm go to [email or web address] - password [email or web address] [facility]  Office  [phone]  CC: Primary care physician; Adin Hector, MD   Note: This dictation was prepared with Dragon dictation along with smaller phrase technology. Any transcriptional errors that result from this process are unintentional.

## 2016-05-06 NOTE — ED Notes (Signed)
Pt reports hx of previous GI bleed and diverticulitis.   Pt c/o dizziness/weakness, diarrhea and GI bleed

## 2016-05-07 DIAGNOSIS — K922 Gastrointestinal hemorrhage, unspecified: Secondary | ICD-10-CM | POA: Diagnosis not present

## 2016-05-07 LAB — BASIC METABOLIC PANEL
ANION GAP: 4 — AB (ref 5–15)
BUN: 20 mg/dL (ref 6–20)
CALCIUM: 8 mg/dL — AB (ref 8.9–10.3)
CO2: 26 mmol/L (ref 22–32)
Chloride: 109 mmol/L (ref 101–111)
Creatinine, Ser: 0.76 mg/dL (ref 0.61–1.24)
GFR calc Af Amer: >60 mL/min (ref >60)
Glucose, Bld: 113 mg/dL — ABNORMAL HIGH (ref 65–99)
POTASSIUM: 3.8 mmol/L (ref 3.5–5.1)
SODIUM: 139 mmol/L (ref 135–145)

## 2016-05-07 LAB — CBC
HCT: 29.2 % — ABNORMAL LOW (ref 40.0–52.0)
Hemoglobin: 10 g/dL — ABNORMAL LOW (ref 13.0–18.0)
MCH: 32.9 pg (ref 26.0–34.0)
MCHC: 34.4 g/dL (ref 32.0–36.0)
MCV: 95.7 fL (ref 80.0–100.0)
PLATELETS: 192 10*3/uL (ref 150–440)
RBC: 3.05 MIL/uL — ABNORMAL LOW (ref 4.40–5.90)
RDW: 14.9 % — AB (ref 11.5–14.5)
WBC: 4.7 10*3/uL (ref 3.8–10.6)

## 2016-05-07 MED ORDER — SODIUM CHLORIDE 0.9 % IV SOLN
INTRAVENOUS | Status: DC
  Administered 2016-05-07: 04:00:00 via INTRAVENOUS

## 2016-05-07 NOTE — Progress Notes (Signed)
Patient alert and oriented; discharge instructions as ordered; denies any abnormal stool this shift; tolerating diet; ambulated around floor this shift; no distress observed; discharged via w/c by staff with family by side.

## 2016-05-07 NOTE — Care Management Obs Status (Signed)
MEDICARE OBSERVATION STATUS NOTIFICATION   Patient Details  Name: THORYN DAVIDOFF MRN: [id number] Date of Birth: 1940-07-20   Medicare Observation Status Notification Given:  No (discharge order in less than 24 hours)    Ival Bible, RN 05/07/2016, 11:57 AM

## 2016-05-07 NOTE — Discharge Instructions (Signed)
Full liquid diet and very easy to digest diet.

## 2016-05-07 NOTE — Discharge Summary (Signed)
[facility] [place] NAME: Ryan Allison    MR#:  [id number]  DATE OF BIRTH:  Jun 18, 1940  DATE OF ADMISSION:  05/06/2016 ADMITTING PHYSICIAN: Vaughan Basta, MD  DATE OF DISCHARGE: 05/07/16  PRIMARY CARE PHYSICIAN: Tama High III, MD    ADMISSION DIAGNOSIS:  Gastrointestinal hemorrhage, unspecified gastrointestinal hemorrhage type [K92.2]  DISCHARGE DIAGNOSIS:  Lower GI bleed appears diverticular resolved History of diverticulosis  SECONDARY DIAGNOSIS:   Past Medical History:  Diagnosis Date  . Arthritis   . Cancer [facility]) 2011   prostate  . Hyperlipidemia   . Intestinal bleeding   . Meniscus tear   . Prostate enlargement     HOSPITAL COURSE:  Ryan Allison  is a 75 y.o. male with a known history of Hyperlipidemia, prostate cancer status post radiation, recurrent diverticular bleeding the past- today around 5 PM started having bowel movement with bright red blood and also passing some clots along with that. He had total 3 bowel movements like this at home and after that he felt very cold, clammy, sweating, weak-so came to emergency room  * GI bleed   Likely diverticular.   Currently hemoglobin is stable, continue monitoring.   on clear liquid diet.    patient had colonoscopy in November 2016 which showed multiple diverticula in. Patient's symptoms have resolved. He is passing gas no more active bleeding. Hemoglobin stable at 10.8. Patient is anxious to go home. He will keep an eye on his bleeding recommended to come back to the emergency room if he rebleeds heavily.  * Hyperlipidemia   Continue simvastatin.  * History of prostate cancer and BPH.   Continue Flomax.  DC home CONSULTS OBTAINED:    DRUG ALLERGIES:   Allergies  Allergen Reactions  . Nsaids     Diverticular bleed  . Venlafaxine Other (See Comments)    sedation  . Celebrex [Celecoxib] Itching and Rash    DISCHARGE MEDICATIONS:    Current Discharge Medication List    CONTINUE these medications which have NOT CHANGED   Details  gabapentin (NEURONTIN) 600 MG tablet Take 600 mg by mouth 4 (four) times daily.    omeprazole (PRILOSEC) 40 MG capsule Take 40 mg by mouth daily.    simvastatin (ZOCOR) 20 MG tablet Take 20 mg by mouth daily.    tamsulosin (FLOMAX) 0.4 MG CAPS capsule Take 0.4 mg by mouth 2 (two) times daily.    traMADol (ULTRAM) 50 MG tablet Take 50 mg by mouth every 6 (six) hours as needed for moderate pain.         If you experience worsening of your admission symptoms, develop shortness of breath, life threatening emergency, suicidal or homicidal thoughts you must seek medical attention immediately by calling 911 or calling your MD immediately  if symptoms less severe.  You Must read complete instructions/literature along with all the possible adverse reactions/side effects for all the Medicines you take and that have been prescribed to you. Take any new Medicines after you have completely understood and accept all the possible adverse reactions/side effects.   Please note  You were cared for by a hospitalist during your hospital stay. If you have any questions about your discharge medications or the care you received while you were in the hospital after you are discharged, you can call the unit and asked to speak with the hospitalist on call if the hospitalist that took care of you is not available. Once you are discharged,  your primary care physician will handle any further medical issues. Please note that NO REFILLS for any discharge medications will be authorized once you are discharged, as it is imperative that you return to your primary care physician (or establish a relationship with a primary care physician if you do not have one) for your aftercare needs so that they can reassess your need for medications and monitor your lab values. Today   SUBJECTIVE   Doing well denies any abdominal pain  passing gas. No more rectal bleed.  VITAL SIGNS:  Blood pressure (!) 108/51, pulse 70, temperature 98 F (36.7 C), temperature source Oral, resp. rate 18, height 6' (1.829 m), weight 83.7 kg (184 lb 8 oz), SpO2 99 %.  I/O:   Intake/Output Summary (Last 24 hours) at 05/07/16 1133 Last data filed at 05/07/16 1045  Gross per 24 hour  Intake              840 ml  Output              975 ml  Net             -135 ml    PHYSICAL EXAMINATION:  GENERAL:  75 y.o.-year-old patient lying in the bed with no acute distress.  EYES: Pupils equal, round, reactive to light and accommodation. No scleral icterus. Extraocular muscles intact.  HEENT: Head atraumatic, normocephalic. Oropharynx and nasopharynx clear.  NECK:  Supple, no jugular venous distention. No thyroid enlargement, no tenderness.  LUNGS: Normal breath sounds bilaterally, no wheezing, rales,rhonchi or crepitation. No use of accessory muscles of respiration.  CARDIOVASCULAR: S1, S2 normal. No murmurs, rubs, or gallops.  ABDOMEN: Soft, non-tender, non-distended. Bowel sounds present. No organomegaly or mass.  EXTREMITIES: No pedal edema, cyanosis, or clubbing.  NEUROLOGIC: Cranial nerves II through XII are intact. Muscle strength 5/5 in all extremities. Sensation intact. Gait not checked.  PSYCHIATRIC: The patient is alert and oriented x 3.  SKIN: No obvious rash, lesion, or ulcer.   DATA REVIEW:   CBC   Recent Labs Lab 05/07/16 0454  WBC 4.7  HGB 10.0*  HCT 29.2*  PLT 192    Chemistries   Recent Labs Lab 05/06/16 1928 05/07/16 0454  NA 138 139  K 4.2 3.8  CL 104 109  CO2 25 26  GLUCOSE 130* 113*  BUN 25* 20  CREATININE 0.94 0.76  CALCIUM 8.8* 8.0*  AST 20  --   ALT 12*  --   ALKPHOS 67  --   BILITOT 0.9  --     Microbiology Results   No results found for this or any previous visit (from the past 240 hour(s)).  RADIOLOGY:  No results found.   Management plans discussed with the patient, family and they  are in agreement.  CODE STATUS:     Code Status Orders        Start     Ordered   05/06/16 2146  Full code  Continuous     05/06/16 2145    Code Status History    Date Active Date Inactive Code Status Order ID Comments User Context   This patient has a current code status but no historical code status.    Advance Directive Documentation   [place] Most Recent Value  Type of Advance Directive  Living will  Pre-existing out of facility DNR order (yellow form or pink MOST form)  No data  "MOST" Form in Place?  No data  TOTAL TIME TAKING CARE OF THIS PATIENT: 40 minutes.    Luanne Krzyzanowski M.D on 05/07/2016 at 11:33 AM  Between 7am to 6pm - Pager - [phone] After 6pm go to [email or web address] - password [email or web address] [facility]  [facility]  Office  [phone]  CC: Primary care physician; Adin Hector, MD

## 2016-05-19 ENCOUNTER — Other Ambulatory Visit
Admission: RE | Admit: 2016-05-19 | Discharge: 2016-05-19 | Disposition: A | Discharge status: Home / Self Care | Payer: Medicare Other | Source: Ambulatory Visit | Attending: Gastroenterology | Admitting: Gastroenterology

## 2016-05-19 DIAGNOSIS — R197 Diarrhea, unspecified: Secondary | ICD-10-CM | POA: Diagnosis present

## 2016-05-19 LAB — GASTROINTESTINAL PANEL BY PCR, STOOL (REPLACES STOOL CULTURE)
Adenovirus F40/41: NOT DETECTED
Astrovirus: NOT DETECTED
Campylobacter species: NOT DETECTED
Cryptosporidium: NOT DETECTED
Cyclospora cayetanensis: NOT DETECTED
ENTAMOEBA HISTOLYTICA: NOT DETECTED
Enteroaggregative E coli (EAEC): NOT DETECTED
Enteropathogenic E coli (EPEC): NOT DETECTED
Enterotoxigenic E coli (ETEC): NOT DETECTED
Giardia lamblia: NOT DETECTED
NOROVIRUS GI/GII: NOT DETECTED
Plesimonas shigelloides: NOT DETECTED
Rotavirus A: NOT DETECTED
SALMONELLA SPECIES: NOT DETECTED
SAPOVIRUS (I, II, IV, AND V): NOT DETECTED
SHIGELLA/ENTEROINVASIVE E COLI (EIEC): NOT DETECTED
Shiga like toxin producing E coli (STEC): NOT DETECTED
VIBRIO CHOLERAE: NOT DETECTED
Vibrio species: NOT DETECTED
Yersinia enterocolitica: NOT DETECTED

## 2016-05-19 LAB — C DIFFICILE QUICK SCREEN W PCR REFLEX
C DIFFICILE (CDIFF) INTERP: NOT DETECTED
C DIFFICILE (CDIFF) TOXIN: NEGATIVE
C DIFFICLE (CDIFF) ANTIGEN: NEGATIVE

## 2016-07-17 DIAGNOSIS — Z9889 Other specified postprocedural states: Secondary | ICD-10-CM | POA: Insufficient documentation

## 2016-07-17 HISTORY — PX: STRABISMUS SURGERY: SHX218

## 2016-09-21 DIAGNOSIS — C778 Secondary and unspecified malignant neoplasm of lymph nodes of multiple regions: Secondary | ICD-10-CM | POA: Insufficient documentation

## 2016-11-13 ENCOUNTER — Ambulatory Visit
Admission: RE | Admit: 2016-11-13 | Discharge: 2016-11-13 | Disposition: A | Discharge status: Home / Self Care | Payer: MEDICARE | Attending: Hematology & Oncology | Admitting: Hematology & Oncology

## 2016-11-13 DIAGNOSIS — C61 Malignant neoplasm of prostate: Principal | ICD-10-CM

## 2016-11-13 DIAGNOSIS — C778 Secondary and unspecified malignant neoplasm of lymph nodes of multiple regions: Secondary | ICD-10-CM

## 2016-12-26 ENCOUNTER — Ambulatory Visit
Admission: RE | Admit: 2016-12-26 | Discharge: 2016-12-26 | Disposition: A | Discharge status: Home / Self Care | Payer: MEDICARE

## 2016-12-26 ENCOUNTER — Ambulatory Visit
Admission: RE | Admit: 2016-12-26 | Discharge: 2016-12-26 | Disposition: A | Discharge status: Home / Self Care | Payer: MEDICARE | Attending: Hematology & Oncology | Admitting: Hematology & Oncology

## 2016-12-26 DIAGNOSIS — C61 Malignant neoplasm of prostate: Principal | ICD-10-CM

## 2016-12-26 DIAGNOSIS — C778 Secondary and unspecified malignant neoplasm of lymph nodes of multiple regions: Secondary | ICD-10-CM

## 2017-01-30 DIAGNOSIS — G629 Polyneuropathy, unspecified: Secondary | ICD-10-CM | POA: Insufficient documentation

## 2017-01-30 DIAGNOSIS — E785 Hyperlipidemia, unspecified: Secondary | ICD-10-CM | POA: Insufficient documentation

## 2017-01-30 DIAGNOSIS — H532 Diplopia: Secondary | ICD-10-CM | POA: Insufficient documentation

## 2017-01-30 DIAGNOSIS — E538 Deficiency of other specified B group vitamins: Secondary | ICD-10-CM | POA: Insufficient documentation

## 2017-01-30 DIAGNOSIS — M8580 Other specified disorders of bone density and structure, unspecified site: Secondary | ICD-10-CM | POA: Insufficient documentation

## 2017-01-30 DIAGNOSIS — K5790 Diverticulosis of intestine, part unspecified, without perforation or abscess without bleeding: Secondary | ICD-10-CM | POA: Insufficient documentation

## 2017-01-30 DIAGNOSIS — C61 Malignant neoplasm of prostate: Secondary | ICD-10-CM | POA: Insufficient documentation

## 2017-01-30 DIAGNOSIS — M419 Scoliosis, unspecified: Secondary | ICD-10-CM | POA: Insufficient documentation

## 2017-01-31 ENCOUNTER — Ambulatory Visit (INDEPENDENT_AMBULATORY_CARE_PROVIDER_SITE_OTHER): Payer: Medicare Other

## 2017-01-31 ENCOUNTER — Ambulatory Visit (INDEPENDENT_AMBULATORY_CARE_PROVIDER_SITE_OTHER): Payer: Medicare Other | Admitting: Podiatry

## 2017-01-31 ENCOUNTER — Encounter: Payer: Self-pay | Admitting: Podiatry

## 2017-01-31 ENCOUNTER — Other Ambulatory Visit: Payer: Self-pay | Admitting: Podiatry

## 2017-01-31 VITALS — BP 159/98 | HR 90 | Resp 16

## 2017-01-31 DIAGNOSIS — Q828 Other specified congenital malformations of skin: Secondary | ICD-10-CM

## 2017-01-31 DIAGNOSIS — M778 Other enthesopathies, not elsewhere classified: Secondary | ICD-10-CM

## 2017-01-31 DIAGNOSIS — M779 Enthesopathy, unspecified: Principal | ICD-10-CM

## 2017-01-31 DIAGNOSIS — M79671 Pain in right foot: Secondary | ICD-10-CM

## 2017-01-31 DIAGNOSIS — M7751 Other enthesopathy of right foot: Secondary | ICD-10-CM

## 2017-01-31 NOTE — Progress Notes (Signed)
Subjective:  Patient ID: Ryan Allison, male    DOB: 07/14/1940,  MRN: [id number] HPI Chief Complaint  Patient presents with  . Foot Pain    Sub 5th MPJ right - callused area x 25 years, recently just gotten more painful, active with exercise and increased pain when walking, tried trimming     76 y.o. male presents with the above complaint.   He presents today with chief complaint of pain sub-fifth metatarsophalangeal joint of the right foot. States his callus for about 25 years but has recently started exercising, more painful. States it is painful once he trims it and when he walks.  Past Medical History:  Diagnosis Date  . Arthritis   . Cancer [facility]) 2011   prostate  . Hyperlipidemia   . Intestinal bleeding   . Meniscus tear   . Prostate enlargement    Past Surgical History:  Procedure Laterality Date  . COLONOSCOPY WITH PROPOFOL N/A 04/06/2015   Procedure: COLONOSCOPY WITH PROPOFOL;  Surgeon: Lollie Sails, MD;  Location[facility] ENDOSCOPY;  Service: Endoscopy;  Laterality: N/A;  . HERNIA REPAIR Left 09-23-14   inguinal  . INGUINAL HERNIA REPAIR Left 09/23/2014   Procedure: HERNIA REPAIR INGUINAL ADULT;  Surgeon: Christene Lye, MD;  Location: [facility] ORS;  Service: General;  Laterality: Left;  . MENISCUS REPAIR  2008  . VASCULAR SURGERY  2015    Current Outpatient Prescriptions:  .  cyanocobalamin 1000 MCG tablet, Take 1,000 mcg by mouth daily., Disp: , Rfl:  .  gabapentin (NEURONTIN) 600 MG tablet, Take 600 mg by mouth 4 (four) times daily., Disp: , Rfl:  .  omeprazole (PRILOSEC) 40 MG capsule, Take 40 mg by mouth daily., Disp: , Rfl:  .  simvastatin (ZOCOR) 20 MG tablet, Take 20 mg by mouth daily., Disp: , Rfl:  .  tamsulosin (FLOMAX) 0.4 MG CAPS capsule, Take 0.4 mg by mouth 2 (two) times daily., Disp: , Rfl:  .  traMADol (ULTRAM) 50 MG tablet, Take 50 mg by mouth every 6 (six) hours as needed for moderate pain. , Disp: , Rfl:   Allergies  Allergen Reactions    . Nsaids Other (See Comments)    Diverticular bleed Diverticular bleed  . Venlafaxine Other (See Comments)    sedation  . Celebrex [Celecoxib] Itching and Rash   Review of Systems  HENT: Positive for tinnitus.   Musculoskeletal: Positive for arthralgias and back pain.  All other systems reviewed and are negative.  Objective:   Vitals:   01/31/17 1402  BP: (!) 159/98  Pulse: 90  Resp: 16   General AA&O x3. Patient is alert and oriented.  Vascular Dorsalis pedis and posterior tibial pulses 2/4 bilat. Brisk capillary refill to all digits. Pedal hair present.  Neurologic Epicritic sensation grossly intact.  Dermatologic No open lesions. Interspaces clear of maceration. Nails well groomed and normal in appearance.Reactive hyperkeratosis plantar aspect of the fifth metatarsophalangeal joints bilaterally right greater than left. He also has no open lesions or wounds.   Orthopedic: MMT 5/5 in dorsiflexion, plantarflexion, inversion, and eversion. Normal joint ROM without pain or crepitus.Mild tenderness on range of motion and palpation sub-fifth metatarsal head and at the metatarsophalangeal joint.    Radiographs:  Demonstrate mild tailor's bunion deformity.  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  Plan: Injected fifth metatarsophalangeal joint today with dexamethasone and local anesthetic debridement and reactive hyperkeratotic lesion and I will follow-up with him  in a couple of weeks. We discussed proper shoe gear stretching exercises ice therapy and shoe modifications. Discussed the possible need for surgery and orthotics.     Estalee Mccandlish T. [place], [place]

## 2017-02-14 ENCOUNTER — Other Ambulatory Visit: Payer: Medicare Other | Admitting: Orthotics

## 2017-04-11 ENCOUNTER — Encounter: Payer: Self-pay | Admitting: Podiatry

## 2017-04-11 ENCOUNTER — Ambulatory Visit (INDEPENDENT_AMBULATORY_CARE_PROVIDER_SITE_OTHER): Payer: Medicare Other | Admitting: Podiatry

## 2017-04-11 DIAGNOSIS — Q828 Other specified congenital malformations of skin: Secondary | ICD-10-CM

## 2017-04-11 DIAGNOSIS — L600 Ingrowing nail: Secondary | ICD-10-CM

## 2017-04-11 MED ORDER — NEOMYCIN-POLYMYXIN-HC 1 % OT SOLN: OTIC | 1 refills | Status: DC

## 2017-04-11 NOTE — Progress Notes (Signed)
He presents today chief complaint of a painful lesion beneath the fifth metatarsal head of his right foot. He is also complaining of painful ingrown toenail hallux left foot. States been like this for quite some time now like to have it completely fixed if possible  Objective: Vital signs are stable alert and oriented 3. Pulses remain palpable bilateral. Reactive hyperkeratotic plantar aspect of the fifth metatarsal head of the right foot moderately tender sharp radial nail margin on the tibial and fibular border hallux left with mild erythema and pain on palpation.  Assessment: Ingrown nail tibiofibular border hallux left sub-fifth metatarsal porokeratotic lesion.  Plan: Debridement of reactive hyperkeratotic lesion. I also performed a chemical matrixectomy to the tibial and fibular border hallux left. He tolerated this procedure well after local anesthesia was administered. He was provided with oral and written home going instructions for care and soaking of his foot and I will follow-up with him in 2 weeks.

## 2017-04-11 NOTE — Patient Instructions (Signed)

## 2017-04-26 ENCOUNTER — Ambulatory Visit
Admission: RE | Admit: 2017-04-26 | Discharge: 2017-04-26 | Disposition: A | Discharge status: Home / Self Care | Payer: MEDICARE | Attending: Hematology & Oncology | Admitting: Hematology & Oncology

## 2017-04-26 ENCOUNTER — Ambulatory Visit
Admission: RE | Admit: 2017-04-26 | Discharge: 2017-04-26 | Disposition: A | Discharge status: Home / Self Care | Payer: MEDICARE

## 2017-04-26 DIAGNOSIS — C61 Malignant neoplasm of prostate: Principal | ICD-10-CM

## 2017-04-26 DIAGNOSIS — C778 Secondary and unspecified malignant neoplasm of lymph nodes of multiple regions: Secondary | ICD-10-CM

## 2017-04-30 ENCOUNTER — Ambulatory Visit (INDEPENDENT_AMBULATORY_CARE_PROVIDER_SITE_OTHER): Payer: Medicare Other | Admitting: Podiatry

## 2017-04-30 ENCOUNTER — Encounter: Payer: Self-pay | Admitting: Podiatry

## 2017-04-30 DIAGNOSIS — L600 Ingrowing nail: Secondary | ICD-10-CM

## 2017-04-30 NOTE — Progress Notes (Signed)
He presents today for follow-up nail procedure hallux left.  States that the Betadine gave me a rash but the nail seems to be doing pretty good is just a little tender but overall is good.  Objective: Vital signs are stable alert and oriented x3.  Pulses are palpable.  Neurologic sensorium is intact.  Deep tendon reflexes are intact.  Dry skin due to contact dermatitis left foot.  Hallux left demonstrates margins with eschars which is typical with healing like this.  No signs of infection.  Assessment: Well-healing surgical toe hallux left.  Plan: Start Epsom salts and warm water soaks every other day covered in the daytime leave open at bedtime.

## 2017-04-30 NOTE — Patient Instructions (Signed)

## 2017-08-27 ENCOUNTER — Ambulatory Visit: Admit: 2017-08-27 | Discharge: 2017-09-09 | Payer: MEDICARE

## 2017-08-27 DIAGNOSIS — C61 Malignant neoplasm of prostate: Principal | ICD-10-CM

## 2017-08-30 ENCOUNTER — Ambulatory Visit
Admit: 2017-08-30 | Discharge: 2017-08-31 | Payer: MEDICARE | Attending: Hematology & Oncology | Primary: Hematology & Oncology

## 2017-08-30 ENCOUNTER — Ambulatory Visit: Admit: 2017-08-30 | Discharge: 2017-08-31 | Payer: MEDICARE

## 2017-08-30 ENCOUNTER — Other Ambulatory Visit: Admit: 2017-08-30 | Discharge: 2017-08-31 | Payer: MEDICARE

## 2017-08-30 DIAGNOSIS — C61 Malignant neoplasm of prostate: Principal | ICD-10-CM

## 2018-01-03 ENCOUNTER — Ambulatory Visit
Admit: 2018-01-03 | Discharge: 2018-01-03 | Payer: MEDICARE | Attending: Hematology & Oncology | Primary: Hematology & Oncology

## 2018-01-03 ENCOUNTER — Other Ambulatory Visit: Admit: 2018-01-03 | Discharge: 2018-01-03 | Payer: MEDICARE

## 2018-01-03 DIAGNOSIS — C61 Malignant neoplasm of prostate: Principal | ICD-10-CM

## 2018-01-03 DIAGNOSIS — C778 Secondary and unspecified malignant neoplasm of lymph nodes of multiple regions: Secondary | ICD-10-CM

## 2018-01-03 DIAGNOSIS — N529 Male erectile dysfunction, unspecified: Secondary | ICD-10-CM

## 2018-01-03 MED ORDER — SILDENAFIL (PULMONARY HYPERTENSION) 20 MG TABLET
ORAL_TABLET | 11 refills | 0 days | Status: SS
Start: 2018-01-03 — End: 2018-07-16

## 2018-04-01 ENCOUNTER — Other Ambulatory Visit: Payer: Self-pay | Admitting: Orthopedic Surgery

## 2018-04-01 DIAGNOSIS — G8929 Other chronic pain: Secondary | ICD-10-CM

## 2018-04-01 DIAGNOSIS — M5441 Lumbago with sciatica, right side: Principal | ICD-10-CM

## 2018-04-19 ENCOUNTER — Ambulatory Visit
Admission: RE | Admit: 2018-04-19 | Discharge: 2018-04-19 | Disposition: A | Discharge status: Home / Self Care | Payer: Medicare Other | Source: Ambulatory Visit | Attending: Orthopedic Surgery | Admitting: Orthopedic Surgery

## 2018-04-19 DIAGNOSIS — M5136 Other intervertebral disc degeneration, lumbar region: Secondary | ICD-10-CM | POA: Diagnosis not present

## 2018-04-19 DIAGNOSIS — M5441 Lumbago with sciatica, right side: Secondary | ICD-10-CM | POA: Insufficient documentation

## 2018-04-19 DIAGNOSIS — G8929 Other chronic pain: Secondary | ICD-10-CM | POA: Diagnosis present

## 2018-04-26 ENCOUNTER — Ambulatory Visit: Admit: 2018-04-26 | Discharge: 2018-05-25 | Payer: MEDICARE

## 2018-04-26 ENCOUNTER — Ambulatory Visit: Admit: 2018-04-26 | Discharge: 2018-05-09 | Payer: MEDICARE

## 2018-04-26 DIAGNOSIS — C61 Malignant neoplasm of prostate: Principal | ICD-10-CM

## 2018-05-15 DIAGNOSIS — I4891 Unspecified atrial fibrillation: Secondary | ICD-10-CM

## 2018-05-15 HISTORY — DX: Unspecified atrial fibrillation: I48.91

## 2018-05-16 ENCOUNTER — Ambulatory Visit
Admit: 2018-05-16 | Discharge: 2018-05-17 | Payer: MEDICARE | Attending: Hematology & Oncology | Primary: Hematology & Oncology

## 2018-05-16 ENCOUNTER — Other Ambulatory Visit: Admit: 2018-05-16 | Discharge: 2018-05-17 | Payer: MEDICARE

## 2018-05-16 DIAGNOSIS — C61 Malignant neoplasm of prostate: Principal | ICD-10-CM

## 2018-05-16 MED ORDER — BICALUTAMIDE 50 MG TABLET
ORAL_TABLET | Freq: Every day | ORAL | 11 refills | 0.00000 days | Status: CP
Start: 2018-05-16 — End: ?

## 2018-06-06 ENCOUNTER — Inpatient Hospital Stay
Admission: EM | Admit: 2018-06-06 | Discharge: 2018-06-08 | DRG: 378 | Disposition: A | Discharge status: Home / Self Care | Payer: Medicare Other | Attending: Internal Medicine | Admitting: Internal Medicine

## 2018-06-06 ENCOUNTER — Encounter: Payer: Self-pay | Admitting: Emergency Medicine

## 2018-06-06 DIAGNOSIS — Z79891 Long term (current) use of opiate analgesic: Secondary | ICD-10-CM

## 2018-06-06 DIAGNOSIS — K648 Other hemorrhoids: Secondary | ICD-10-CM | POA: Diagnosis present

## 2018-06-06 DIAGNOSIS — D62 Acute posthemorrhagic anemia: Secondary | ICD-10-CM | POA: Diagnosis present

## 2018-06-06 DIAGNOSIS — N400 Enlarged prostate without lower urinary tract symptoms: Secondary | ICD-10-CM | POA: Diagnosis present

## 2018-06-06 DIAGNOSIS — K921 Melena: Secondary | ICD-10-CM

## 2018-06-06 DIAGNOSIS — R000 Tachycardia, unspecified: Secondary | ICD-10-CM | POA: Diagnosis not present

## 2018-06-06 DIAGNOSIS — N401 Enlarged prostate with lower urinary tract symptoms: Secondary | ICD-10-CM | POA: Diagnosis present

## 2018-06-06 DIAGNOSIS — I4510 Unspecified right bundle-branch block: Secondary | ICD-10-CM | POA: Diagnosis present

## 2018-06-06 DIAGNOSIS — K5730 Diverticulosis of large intestine without perforation or abscess without bleeding: Secondary | ICD-10-CM | POA: Diagnosis not present

## 2018-06-06 DIAGNOSIS — E785 Hyperlipidemia, unspecified: Secondary | ICD-10-CM | POA: Diagnosis present

## 2018-06-06 DIAGNOSIS — I483 Typical atrial flutter: Secondary | ICD-10-CM | POA: Diagnosis not present

## 2018-06-06 DIAGNOSIS — I4430 Unspecified atrioventricular block: Secondary | ICD-10-CM | POA: Diagnosis present

## 2018-06-06 DIAGNOSIS — C778 Secondary and unspecified malignant neoplasm of lymph nodes of multiple regions: Secondary | ICD-10-CM | POA: Diagnosis present

## 2018-06-06 DIAGNOSIS — K922 Gastrointestinal hemorrhage, unspecified: Secondary | ICD-10-CM

## 2018-06-06 DIAGNOSIS — Z7951 Long term (current) use of inhaled steroids: Secondary | ICD-10-CM | POA: Diagnosis not present

## 2018-06-06 DIAGNOSIS — K626 Ulcer of anus and rectum: Secondary | ICD-10-CM

## 2018-06-06 DIAGNOSIS — I452 Bifascicular block: Secondary | ICD-10-CM | POA: Diagnosis present

## 2018-06-06 DIAGNOSIS — I4892 Unspecified atrial flutter: Secondary | ICD-10-CM | POA: Diagnosis present

## 2018-06-06 DIAGNOSIS — G4733 Obstructive sleep apnea (adult) (pediatric): Secondary | ICD-10-CM | POA: Diagnosis present

## 2018-06-06 DIAGNOSIS — I700 Atherosclerosis of aorta: Secondary | ICD-10-CM | POA: Diagnosis present

## 2018-06-06 DIAGNOSIS — K210 Gastro-esophageal reflux disease with esophagitis: Secondary | ICD-10-CM | POA: Diagnosis present

## 2018-06-06 DIAGNOSIS — K5731 Diverticulosis of large intestine without perforation or abscess with bleeding: Principal | ICD-10-CM | POA: Diagnosis present

## 2018-06-06 DIAGNOSIS — K644 Residual hemorrhoidal skin tags: Secondary | ICD-10-CM | POA: Diagnosis present

## 2018-06-06 DIAGNOSIS — I361 Nonrheumatic tricuspid (valve) insufficiency: Secondary | ICD-10-CM | POA: Diagnosis not present

## 2018-06-06 DIAGNOSIS — D500 Iron deficiency anemia secondary to blood loss (chronic): Secondary | ICD-10-CM | POA: Diagnosis not present

## 2018-06-06 DIAGNOSIS — K627 Radiation proctitis: Secondary | ICD-10-CM | POA: Diagnosis present

## 2018-06-06 DIAGNOSIS — Z923 Personal history of irradiation: Secondary | ICD-10-CM

## 2018-06-06 DIAGNOSIS — Z8546 Personal history of malignant neoplasm of prostate: Secondary | ICD-10-CM

## 2018-06-06 DIAGNOSIS — C61 Malignant neoplasm of prostate: Secondary | ICD-10-CM | POA: Diagnosis present

## 2018-06-06 DIAGNOSIS — R531 Weakness: Secondary | ICD-10-CM

## 2018-06-06 DIAGNOSIS — Z87891 Personal history of nicotine dependence: Secondary | ICD-10-CM

## 2018-06-06 DIAGNOSIS — Z79899 Other long term (current) drug therapy: Secondary | ICD-10-CM | POA: Diagnosis not present

## 2018-06-06 DIAGNOSIS — G629 Polyneuropathy, unspecified: Secondary | ICD-10-CM | POA: Diagnosis present

## 2018-06-06 DIAGNOSIS — R338 Other retention of urine: Secondary | ICD-10-CM | POA: Diagnosis present

## 2018-06-06 DIAGNOSIS — Z809 Family history of malignant neoplasm, unspecified: Secondary | ICD-10-CM

## 2018-06-06 HISTORY — DX: Personal history of other diseases of the digestive system: Z87.19

## 2018-06-06 LAB — MRSA PCR SCREENING: MRSA by PCR: NEGATIVE

## 2018-06-06 LAB — CBC WITH DIFFERENTIAL/PLATELET
Abs Immature Granulocytes: 0.03 10*3/uL (ref 0.00–0.07)
Abs Immature Granulocytes: 0.02 10*3/uL (ref 0.00–0.07)
BASOS ABS: 0 10*3/uL (ref 0.0–0.1)
Basophils Absolute: 0 10*3/uL (ref 0.0–0.1)
Basophils Relative: 0 %
Basophils Relative: 0 %
EOS ABS: 0.1 10*3/uL (ref 0.0–0.5)
EOS ABS: 0 10*3/uL (ref 0.0–0.5)
Eosinophils Relative: 3 %
Eosinophils Relative: 1 %
HEMATOCRIT: 35.1 % — AB (ref 39.0–52.0)
HEMATOCRIT: 36.2 % — AB (ref 39.0–52.0)
Hemoglobin: 11.8 g/dL — ABNORMAL LOW (ref 13.0–17.0)
Hemoglobin: 12.5 g/dL — ABNORMAL LOW (ref 13.0–17.0)
IMMATURE GRANULOCYTES: 1 %
IMMATURE GRANULOCYTES: 0 %
Lymphocytes Relative: 34 %
Lymphocytes Relative: 15 %
Lymphs Abs: 1.7 10*3/uL (ref 0.7–4.0)
Lymphs Abs: 1.1 10*3/uL (ref 0.7–4.0)
MCH: 33.2 pg (ref 26.0–34.0)
MCH: 32.6 pg (ref 26.0–34.0)
MCHC: 33.6 g/dL (ref 30.0–36.0)
MCHC: 34.5 g/dL (ref 30.0–36.0)
MCV: 98.9 fL (ref 80.0–100.0)
MCV: 94.3 fL (ref 80.0–100.0)
MONO ABS: 0.5 10*3/uL (ref 0.1–1.0)
MONOS PCT: 8 %
Monocytes Absolute: 0.6 10*3/uL (ref 0.1–1.0)
Monocytes Relative: 9 %
NEUTROS ABS: 2.6 10*3/uL (ref 1.7–7.7)
Neutro Abs: 5.5 10*3/uL (ref 1.7–7.7)
Neutrophils Relative %: 53 %
Neutrophils Relative %: 76 %
PLATELETS: 209 10*3/uL (ref 150–400)
Platelets: 200 10*3/uL (ref 150–400)
RBC: 3.55 MIL/uL — AB (ref 4.22–5.81)
RBC: 3.84 MIL/uL — ABNORMAL LOW (ref 4.22–5.81)
RDW: 12.5 % (ref 11.5–15.5)
RDW: 13.3 % (ref 11.5–15.5)
WBC: 5 10*3/uL (ref 4.0–10.5)
WBC: 7.3 10*3/uL (ref 4.0–10.5)
nRBC: 0 % (ref 0.0–0.2)
nRBC: 0 % (ref 0.0–0.2)

## 2018-06-06 LAB — HEMOGLOBIN AND HEMATOCRIT, BLOOD
HCT: 38 % — ABNORMAL LOW (ref 39.0–52.0)
Hemoglobin: 12.8 g/dL — ABNORMAL LOW (ref 13.0–17.0)

## 2018-06-06 LAB — HEMOGLOBIN A1C
Hgb A1c MFr Bld: 5.5 % (ref 4.8–5.6)
Mean Plasma Glucose: 111.15 mg/dL

## 2018-06-06 LAB — COMPREHENSIVE METABOLIC PANEL
ALT: 14 U/L (ref 0–44)
AST: 17 U/L (ref 15–41)
Albumin: 3.6 g/dL (ref 3.5–5.0)
Alkaline Phosphatase: 61 U/L (ref 38–126)
Anion gap: 4 — ABNORMAL LOW (ref 5–15)
BUN: 22 mg/dL (ref 8–23)
CO2: 28 mmol/L (ref 22–32)
CREATININE: 0.89 mg/dL (ref 0.61–1.24)
Calcium: 8.5 mg/dL — ABNORMAL LOW (ref 8.9–10.3)
Chloride: 106 mmol/L (ref 98–111)
GFR calc non Af Amer: >60 mL/min (ref >60)
Glucose, Bld: 135 mg/dL — ABNORMAL HIGH (ref 70–99)
POTASSIUM: 4.3 mmol/L (ref 3.5–5.1)
Sodium: 138 mmol/L (ref 135–145)
TOTAL PROTEIN: 6.4 g/dL — AB (ref 6.5–8.1)
Total Bilirubin: 0.7 mg/dL (ref 0.3–1.2)

## 2018-06-06 LAB — LIPASE, BLOOD: Lipase: 51 U/L (ref 11–51)

## 2018-06-06 LAB — TROPONIN I
Troponin I: <0.03 ng/mL (ref <0.03)
Troponin I: <0.03 ng/mL (ref <0.03)
Troponin I: <0.03 ng/mL (ref <0.03)

## 2018-06-06 LAB — PROTIME-INR
INR: 0.99
PROTHROMBIN TIME: 13 s (ref 11.4–15.2)

## 2018-06-06 LAB — APTT: aPTT: 28 seconds (ref 24–36)

## 2018-06-06 LAB — GLUCOSE, CAPILLARY: Glucose-Capillary: 95 mg/dL (ref 70–99)

## 2018-06-06 LAB — PREPARE RBC (CROSSMATCH)

## 2018-06-06 LAB — TSH: TSH: 2.43 u[IU]/mL (ref 0.350–4.500)

## 2018-06-06 MED ORDER — PANTOPRAZOLE SODIUM 40 MG IV SOLR
40.0000 mg | Freq: Two times a day (BID) | INTRAVENOUS | Status: DC
  Administered 2018-06-06 – 2018-06-08 (×4): 40 mg via INTRAVENOUS
  Filled 2018-06-06 (×4): qty 40

## 2018-06-06 MED ORDER — PEG 3350-KCL-NA BICARB-NACL 420 G PO SOLR
4000.0000 mL | Freq: Once | ORAL | Status: AC
  Administered 2018-06-06: 4000 mL via ORAL
  Filled 2018-06-06: qty 4000

## 2018-06-06 MED ORDER — ACETAMINOPHEN 650 MG RE SUPP: 650.0000 mg | Freq: Four times a day (QID) | RECTAL | Status: DC | PRN

## 2018-06-06 MED ORDER — ONDANSETRON HCL 4 MG/2ML IJ SOLN: 4.0000 mg | Freq: Four times a day (QID) | INTRAMUSCULAR | Status: DC | PRN

## 2018-06-06 MED ORDER — SODIUM CHLORIDE 0.9 % IV SOLN
Freq: Once | INTRAVENOUS | Status: AC
  Administered 2018-06-06: 06:00:00 via INTRAVENOUS

## 2018-06-06 MED ORDER — BISACODYL 5 MG PO TBEC
10.0000 mg | DELAYED_RELEASE_TABLET | Freq: Once | ORAL | Status: AC
  Administered 2018-06-06: 10 mg via ORAL
  Filled 2018-06-06: qty 2

## 2018-06-06 MED ORDER — ONDANSETRON HCL 4 MG PO TABS: 4.0000 mg | ORAL_TABLET | Freq: Four times a day (QID) | ORAL | Status: DC | PRN

## 2018-06-06 MED ORDER — ACETAMINOPHEN 325 MG PO TABS: 650.0000 mg | ORAL_TABLET | Freq: Four times a day (QID) | ORAL | Status: DC | PRN

## 2018-06-06 MED ORDER — SODIUM CHLORIDE 0.9 % IV SOLN
INTRAVENOUS | Status: DC
  Administered 2018-06-07: 09:00:00 via INTRAVENOUS

## 2018-06-06 MED ORDER — DILTIAZEM HCL-DEXTROSE 100-5 MG/100ML-% IV SOLN (PREMIX)
5.0000 mg/h | INTRAVENOUS | Status: DC
  Administered 2018-06-06: 5 mg/h via INTRAVENOUS
  Filled 2018-06-06 (×2): qty 100

## 2018-06-06 MED ORDER — TRANEXAMIC ACID-NACL 1000-0.7 MG/100ML-% IV SOLN
1000.0000 mg | INTRAVENOUS | Status: AC
  Administered 2018-06-06: 1000 mg via INTRAVENOUS
  Filled 2018-06-06: qty 100

## 2018-06-06 MED ORDER — SODIUM CHLORIDE 0.9 % IV BOLUS
1000.0000 mL | Freq: Once | INTRAVENOUS | Status: AC
  Administered 2018-06-06: 1000 mL via INTRAVENOUS

## 2018-06-06 MED ORDER — PEG 3350-KCL-NA BICARB-NACL 420 G PO SOLR: 4000.0000 mL | Freq: Once | ORAL | Status: DC

## 2018-06-06 MED ORDER — LACTATED RINGERS IV SOLN
INTRAVENOUS | Status: DC
  Administered 2018-06-06 – 2018-06-07 (×3): via INTRAVENOUS

## 2018-06-06 NOTE — Progress Notes (Signed)
Pt A.flutter on monitor, with increasing HR with movement. Pt's HR went up to 130's-140's when getting up to [facility]. Pt said he felt dizzy when up. NP notified, new orders placed. Will continue to monitor.

## 2018-06-06 NOTE — ED Provider Notes (Addendum)
[facility] Emergency Department Provider Note  ____________________________________________   First MD Initiated Contact with Patient 06/06/18 [phone]     (approximate)  I have reviewed the triage vital signs and the nursing notes.   HISTORY  Chief Complaint Rectal Bleeding    HPI Ryan Allison is a 78 y.o. male with medical history as listed below which notably includes multiple prior episodes of bright red blood per rectum including an extended stay in the ICU at some point out of before 2016 due to acute blood loss and persistent diverticular bleed.  He presents by EMS tonight with active bright red blood per rectum x2 episodes at home that awoke him from sleep.  He states he was in his usual state of health yesterday with no complaints.  He woke up with an urgent need to have a bowel movement when he did so it was a large volume of blood, explosive, and afterwards he felt weak.  He has been through this before so he tried to ignore it but had another similar episode and at that point felt severely weak, dizzy, lightheaded, and generally ill.  He called 911 at the urging of his neighbor whom he called for assistance.  Upon arrival, the paramedics found him weak and tachycardic at a rate of about 135.  Upon arrival the patient reports mild to moderate aching generalized abdominal pain.  He still feels the urge to have a bowel movement but is trying to hold it back.  He denies fever/chills, chest pain, shortness of breath, nausea, vomiting, and dysuria.  He said that he has been told in the past that if he has additional episodes like this he may require surgery.  He does not take any blood thinners including no NSAIDs, no antiplatelet agents such as aspirin or Plavix, and no other agents such as warfarin, Eliquis, etc.  Past Medical History:  Diagnosis Date  . Arthritis   . Cancer [facility]) 2011   prostate  . History of GI diverticular bleed    extended stay in the  [facility] ICU due to GI bleeding prior to 2016  . Hyperlipidemia   . Intestinal bleeding   . Meniscus tear   . Prostate enlargement     Patient Active Problem List   Diagnosis Date Noted  . B12 deficiency 01/30/2017  . Diplopia 01/30/2017  . Diverticulosis 01/30/2017  . Hyperlipidemia 01/30/2017  . Kyphoscoliosis 01/30/2017  . Malignant neoplasm of prostate ([place]) 01/30/2017  . Osteopenia 01/30/2017  . Peripheral polyneuropathy 01/30/2017  . 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 Status post eye surgery 07/17/2016  . GI bleed 05/06/2016  . Cancer of prostate ([place]) 04/03/2016  . Aortic atherosclerosis ([place]) 10/21/2015  . DDD (degenerative disc disease), lumbar 10/21/2015  . GERD (gastroesophageal reflux disease) 01/20/2014  . Esotropia 03/12/2012  . 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 Myopia of both eyes 03/12/2012  . Amblyopia of right eye 02/06/2012  . Senile nuclear sclerosis 02/06/2012    Past Surgical History:  Procedure Laterality Date  . COLONOSCOPY WITH PROPOFOL N/A 04/06/2015   Procedure: COLONOSCOPY WITH PROPOFOL;  Surgeon: Lollie Sails, MD;  Location: [facility] ENDOSCOPY;  Service: Endoscopy;  Laterality: N/A;  . HERNIA REPAIR Left 09-23-14   inguinal  . INGUINAL HERNIA REPAIR Left 09/23/2014   Procedure: HERNIA REPAIR INGUINAL ADULT;  Surgeon: Christene Lye, MD;  Location: [facility] ORS;  Service: General;  Laterality: Left;  . MENISCUS REPAIR  2008  . VASCULAR SURGERY  2015    Prior to Admission medications   Medication Sig Start Date End Date Taking? Authorizing Provider  cyanocobalamin 1000 MCG tablet Take 1,000 mcg by mouth daily.    [provider]  gabapentin (NEURONTIN) 600 MG tablet Take 600 mg by mouth 4 (four) times daily.    [provider]  NEOMYCIN-POLYMYXIN-HYDROCORTISONE (CORTISPORIN) 1 % SOLN OTIC solution Apply 1-2 drops to toe BID after soaking 04/11/17   Hyatt, Max T, DPM    omeprazole (PRILOSEC) 40 MG capsule Take 40 mg by mouth daily.    [provider]  simvastatin (ZOCOR) 20 MG tablet Take 20 mg by mouth daily.    [provider]  tamsulosin (FLOMAX) 0.4 MG CAPS capsule Take 0.4 mg by mouth 2 (two) times daily.    [provider]  traMADol (ULTRAM) 50 MG tablet Take 50 mg by mouth every 6 (six) hours as needed for moderate pain.     [provider]    Allergies Nsaids; Tolmetin; Venlafaxine; and Celebrex [celecoxib]  Family History  Problem Relation Age of Onset  . Cancer Father   . Cancer Brother     Social History Social History   Tobacco Use  . Smoking status: Former Research scientist (life sciences)  . Smokeless tobacco: Never Used  Substance Use Topics  . Alcohol use: No  . Drug use: No    Review of Systems Constitutional: Generalized weakness.  No fever. Eyes: No visual changes. ENT: No sore throat. Cardiovascular: Denies chest pain. Respiratory: Denies shortness of breath. Gastrointestinal: Dull and cramping abdominal pain as described above with acute large-volume bright red blood per rectum x2 episodes recently at home. Genitourinary: Negative for dysuria. Musculoskeletal: Negative for neck pain.  Negative for back pain. Integumentary: Negative for rash. Neurological: Negative for headaches, focal weakness or numbness.   ____________________________________________   PHYSICAL EXAM:  VITAL SIGNS: ED Triage Vitals  Enc Vitals Group     BP 06/06/18 0600 (!) 146/120     Pulse Rate 06/06/18 0600 77     Resp 06/06/18 0600 19     Temp 06/06/18 0600 97.6 F (36.4 C)     Temp Source 06/06/18 0600 Oral     SpO2 06/06/18 0600 99 %     Weight --      Height --      Head Circumference --      Peak Flow --      Pain Score 06/06/18 0601 3     Pain Loc --      Pain Edu? --      Excl. in [place]? --     Constitutional: Alert and oriented.  Ill-appearing, pale, mildly diaphoretic.  The patient is slow to respond at times  and appears somewhat somnolent. Eyes: Conjunctivae are normal.  Head: Atraumatic. Nose: No congestion/rhinnorhea. Mouth/Throat: Mucous membranes are moist. Neck: No stridor.  No meningeal signs.   Cardiovascular: Normal rate, regular rhythm. Good peripheral circulation. Grossly normal heart sounds. Respiratory: Normal respiratory effort.  No retractions. Lungs CTAB. Gastrointestinal: Mild diffuse tenderness to palpation of his abdomen but with no rebound and no guarding.  No distention.  Rectal exam is notable for bright red blood and no visible stool.   Musculoskeletal: No lower extremity tenderness nor edema. No gross deformities of extremities. Neurologic:  Normal speech and language. No gross focal neurologic deficits are appreciated.  Skin: Severe pallor, mildly diaphoretic, warm.   ____________________________________________   LABS (all labs ordered are listed,  but only abnormal results are displayed)  Labs Reviewed  COMPREHENSIVE METABOLIC PANEL - Abnormal; Notable for the following components:      Result Value   Glucose, Bld 135 (*)    Calcium 8.5 (*)    Total Protein 6.4 (*)    Anion gap 4 (*)    All other components within normal limits  CBC WITH DIFFERENTIAL/PLATELET - Abnormal; Notable for the following components:   RBC 3.55 (*)    Hemoglobin 11.8 (*)    HCT 35.1 (*)    All other components within normal limits  PROTIME-INR  APTT  LIPASE, BLOOD  TROPONIN I  PREPARE RBC (CROSSMATCH)  TYPE AND SCREEN  PREPARE RBC (CROSSMATCH)   ____________________________________________  EKG  ED ECG REPORT I, Hinda Kehr, the attending physician, personally viewed and interpreted this ECG.  Date: 06/06/2018 EKG Time: 6:41 Rate: 109 Rhythm: a-flutter  QRS Axis: normal Intervals: RBBB ST/T Wave abnormalities: Non-specific ST segment / T-wave changes, but no clear evidence of acute ischemia. Narrative Interpretation: no definitive evidence of acute ischemia; does not  meet STEMI criteria.   ____________________________________________  RADIOLOGY   ED MD interpretation: No indication for emergent imaging  Official radiology report(s): No results found.  ____________________________________________   PROCEDURES  Critical Care performed: Yes, see critical care procedure note(s)   Procedure(s) performed:   .Critical Care Performed by: Hinda Kehr, MD Authorized by: Hinda Kehr, MD   Critical care provider statement:    Critical care time (minutes):  60   Critical care time was exclusive of:  Separately billable procedures and treating other patients   Critical care was necessary to treat or prevent imminent or life-threatening deterioration of the following conditions:  Shock (acute blood loss requiring transfusion(s))   Critical care was time spent personally by me on the following activities:  Development of treatment plan with patient or surrogate, discussions with consultants, evaluation of patient's response to treatment, examination of patient, obtaining history from patient or surrogate, ordering and performing treatments and interventions, ordering and review of laboratory studies, ordering and review of radiographic studies, pulse oximetry, re-evaluation of patient's condition and review of old charts     ____________________________________________   INITIAL IMPRESSION / [place] / ED COURSE  As part of my medical decision making, I reviewed the following data within the [place] notes reviewed and incorporated, Labs reviewed , EKG interpreted , Old chart reviewed, Discussed with admitting physician  and Notes from prior ED visits    Differential diagnosis includes, but is not limited to, diverticular bleed, neoplasm, AVM malformation, less likely hemorrhoids.  The patient is unstable with a heart rate of 140 during my assessment, pallor, diaphoretic, and appears somewhat somnolent.   Anticipate his hemoglobin will be normal or close to it given that this was a very acute onset of bleeding but I must treat him clinically.  He took a Clinical research associate and video of the blood and his commode at home and it was a very large volume of blood which is consistent clinically with his presentation.  He has an extensive history of GI bleeding that was very severe at some point in the past.  I will treat aggressively with 2 units of emergency release O+ blood while obtaining a type and screen and the rest of the standard blood work.  There is no role for octreotide but I have ordered tranexamic acid 1 g IV to try to slow his bleeding.  He is still having  the urge to have a bowel movement and thus far has been able to hold off.  No indication or role for CT scan at this time; he will likely need a tagged red cell scan or other alternative bleeding evaluation once he is in the hospital as determined by the admission team and our GI team.  He is getting normal saline IV bolus until we get the emergency release blood going at which point I have asked them to shut off the bolus and only use it as transport fluid for the tranexamic acid piggyback.  The patient has no signs or symptoms of infection at this time and he is having no chest pain or shortness of breath.  Clinical Course as of Jun 06 717  Thu Jun 06, 2018  0637 Reflects his baseline hemoglobin, not the new value at which he will arrive after his body adjusts after the acute bleeding.  Hemoglobin(!): 11.8 [CF]  0637 First unit of PRBCs is running now, and the TXA will finish soon.  Lab called to say that the type and screen hemolyzed, will send another sample   [CF]  [phone] Patient says he is feeling worse, looking increasingly pale and mildly diaphoretic,   [CF]  0645  he is stating "something is not right".  He is not having any chest pain or shortness of breath. his blood pressure is dropping and is now around 244 systolic even though his heart rate is  coming down and is now around 90..  Lung sounds are still clear bilaterally.  No evidence of transfusion reaction.  I have authorized continuation of the fluid bolus and I have asked them to increase the rate of the blood transfusion to go and as fast as possible.  I verified the patient is full code and I moved to the code cart and emergency airway equipment into the room.    [CF]  [id number] EKG indicates atrial flutter without evidence of acute ischemia and I think this is likely the result of his acute blood loss.  I called the lab and asked them to start processing the add on troponin I ordered.   [CF]  [phone] I reviewed old EKGs and see no evidence of prior atrial flutter or atrial fibrillation.   [CF]  [phone] The bradycardic pulse rates identified in the medical record are in accurate.  I think that the monitor is not picking up or recording numbers correctly.  He is on the cardiac monitor visualized at my desk and he has a heart rate between 90 and 100 at this time.   [CF]  [phone] Patient's blood work is pending.  Hemodynamically stable at this time but still very concerning appearance.  I have discussed the case by phone just now with Dr. Marcille Blanco with hospitalist service and expressed my concern over the patient's status.  I have also ordered preparation of an additional 4 units of blood after the type and screen has been prepared but have not ordered the transfusion of that blood, I do think it would be beneficial to have it ready for him.   [CF]  0700 Patient feeling a bit better.  Color has improved.  Lungs still clear.  Continuing with second unit PRBCs.   [CF]  0701 Normal CMP  Comprehensive metabolic panel(!) [CF]  0347 Coags reassuring.     [CF]  [phone] Now that the patient has stabilized a bit after the initial resuscitation, he provided the additional information that his prostate cancer has recurred  and he is being treated for active malignancy with metastases with hormonal treatment  (bicalutamide) at [facility].  I do not believe that this would have altered the initial management or the use of blood transfusions given that he was unstable and in imminent danger of cardiac arrest based on my clinical assessment and his vital signs, but it is notable and may impact his additional work-up and treatment.   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    Hinda Kehr, MD 06/06/18 Gloriajean Dell    Hinda Kehr, MD 06/06/18 [phone]

## 2018-06-06 NOTE — Consult Note (Signed)
Vonda Antigua, MD [address] [place], [place], [place], [address] [address], [place], [place], [place], [address] Phone: [phone]  Fax: [phone]  Consultation  Referring Provider:     Dr. Brett Albino Primary Care Physician:  Adin Hector, MD Reason for Consultation:     Rectal bleeding  Date of Admission:  06/06/2018 Date of Consultation:  06/06/2018         HPI:   Ryan Allison is a 78 y.o. male with previous history of diverticular bleeding presents to hospital with 1 day history of right above per rectum.  Symptoms started at 5 AM this morning and patient reports having 2 episodes of bright red blood per rectum.  States he has been to the hospital before for this, but there have been times that he has had bright red blood per rectum at home but it self resolved quickly and he did not come to the hospital.  Last time this occurred was 1 month ago when he stayed at home.  No abdominal pain, nausea vomiting, hematemesis, melena.  Pt. Follows with [facility]. Last colonoscopy was in November 2016 as an outpatient with Dr. Gustavo Lah.  Diverticulosis was reported.  Hepatic flexure diverticulum was reported to have a small spot of ecchymosis.  Subcentimeter transverse colon polyp removed.  Dr. Marton Redwood report mentions small patchy angiectasia is in the rectum.  Nonbleeding internal hemorrhoids nonbleeding external hemorrhoids were also reported.  Patient had a positive bleeding scan in 2015 and underwent embolization with Dr. Delana Meyer at that time.  He is hemodynamically stable on this admission.  Last admission for diverticular bleeding was in 2017 and was managed conservatively at that time with no GI consult or interventions done  Patient had an EGD and colonoscopy in 2012 as well for anemia and bleeding at that time.  EGD showed gastritis and esophagitis.  Diverticulosis reported and 1 mm colon polyp removed.  Past Medical History:  Diagnosis Date  .  Arthritis   . Cancer [facility]) 2011   prostate  . History of GI diverticular bleed    extended stay in the [facility] ICU due to GI bleeding prior to 2016  . Hyperlipidemia   . Intestinal bleeding   . Meniscus tear   . Prostate enlargement     Past Surgical History:  Procedure Laterality Date  . COLONOSCOPY WITH PROPOFOL N/A 04/06/2015   Procedure: COLONOSCOPY WITH PROPOFOL;  Surgeon: Lollie Sails, MD;  Location: [facility] ENDOSCOPY;  Service: Endoscopy;  Laterality: N/A;  . HERNIA REPAIR Left 09-23-14   inguinal  . INGUINAL HERNIA REPAIR Left 09/23/2014   Procedure: HERNIA REPAIR INGUINAL ADULT;  Surgeon: Christene Lye, MD;  Location: [facility] ORS;  Service: General;  Laterality: Left;  . MENISCUS REPAIR  2008  . VASCULAR SURGERY  2015    Prior to Admission medications   Medication Sig Start Date End Date Taking? Authorizing Provider  Acetaminophen 500 MG coapsule Take 500 mg by mouth daily as needed.   Yes [provider]  bicalutamide (CASODEX) 50 MG tablet Take 50 mg by mouth daily. 05/16/18  Yes [provider]  cyanocobalamin 1000 MCG tablet Take 1,000 mcg by mouth daily.   Yes [provider]  docusate sodium (COLACE) 100 MG capsule Take 100 mg by mouth daily as needed.   Yes [provider]  gabapentin (NEURONTIN) 600 MG tablet Take 600 mg by mouth 4 (four) times daily.   Yes [provider]  omeprazole (  PRILOSEC) 40 MG capsule Take 40 mg by mouth daily.   Yes [provider]  polyethylene glycol (MIRALAX) packet Take 17 g by mouth daily as needed.   Yes [provider]  simvastatin (ZOCOR) 20 MG tablet Take 20 mg by mouth daily.   Yes [provider]  tamsulosin (FLOMAX) 0.4 MG CAPS capsule Take 0.4 mg by mouth daily.    Yes [provider]  traMADol (ULTRAM) 50 MG tablet Take 50 mg by mouth every 6 (six) hours as needed for moderate pain.    Yes [provider]    NEOMYCIN-POLYMYXIN-HYDROCORTISONE (CORTISPORIN) 1 % SOLN OTIC solution Apply 1-2 drops to toe BID after soaking Patient not taking: Reported on 06/06/2018 04/11/17   Garrel Ridgel, DPM    Family History  Problem Relation Age of Onset  . Cancer Father   . Cancer Brother      Social History   Tobacco Use  . Smoking status: Former Research scientist (life sciences)  . Smokeless tobacco: Never Used  Substance Use Topics  . Alcohol use: No  . Drug use: No    Allergies as of 06/06/2018 - Review Complete 06/06/2018  Allergen Reaction Noted  . Nsaids Other (See Comments) 09/10/2014  . Tolmetin  09/10/2014  . Venlafaxine Other (See Comments) 09/10/2014  . Celebrex [celecoxib] Itching and Rash 07/13/2014    Review of Systems:    All systems reviewed and negative except where noted in HPI.   Physical Exam:  Vital signs in last 24 hours: Vitals:   06/06/18 0750 06/06/18 0800 06/06/18 0828 06/06/18 0900  BP: 118/67 116/64 125/72 121/73  Pulse: 77  90 (!) 51  Resp: 12 12 18 12   Temp:   (!) 97.5 F (36.4 C)   TempSrc:   Oral   SpO2: 100%  100%   Weight:   90.3 kg   Height:   5\' 10"  (1.778 m)    Last BM Date: 06/06/18 General:   Pleasant, cooperative in NAD Head:  Normocephalic and atraumatic. Eyes:   No icterus.   Conjunctiva pink. PERRLA. Ears:  Normal auditory acuity. Neck:  Supple; no masses or thyroidomegaly Lungs: Respirations even and unlabored. Lungs clear to auscultation bilaterally.   No wheezes, crackles, or rhonchi.  Abdomen:  Soft, nondistended, nontender. Normal bowel sounds. No appreciable masses or hepatomegaly.  No rebound or guarding.  Neurologic:  Alert and oriented x3;  grossly normal neurologically. Skin:  Intact without significant lesions or rashes. Cervical Nodes:  No significant cervical adenopathy. Psych:  Alert and cooperative. Normal affect.  LAB RESULTS: Recent Labs    06/06/18 0607  WBC 5.0  HGB 11.8*  HCT 35.1*  PLT 209   BMET Recent Labs    06/06/18 0607  NA  138  K 4.3  CL 106  CO2 28  GLUCOSE 135*  BUN 22  CREATININE 0.89  CALCIUM 8.5*   LFT Recent Labs    06/06/18 0607  PROT 6.4*  ALBUMIN 3.6  AST 17  ALT 14  ALKPHOS 61  BILITOT 0.7   PT/INR Recent Labs    06/06/18 0607  LABPROT 13.0  INR 0.99    STUDIES: No results found.    Impression / Plan:   MICHAL CALLICOTT is a 78 y.o. y/o male with hematochezia with previous history of diverticular bleeding here with hematochezia  Clinical findings most consistent with diverticular bleeding Patient is hemodynamically stable and hemoglobin is lowest at 11.8.  Since his last colonoscopy was in 2016 and  he has rectal bleeding on this admission a colonoscopy is indicated to evaluate for any ongoing bleeding and treat if possible.  The colonoscopy prep can also be therapeutic at times and help treat the bleeding at the same time  Low suspicion for upper GI bleed, but would recommend PPI IV twice daily until colonoscopy confirms evidence of recent colon bleeding  PPI IV twice daily  Continue serial CBCs and transfuse PRN Avoid NSAIDs Maintain 2 large-bore IV lines Please page GI with any acute hemodynamic changes, or signs of active GI bleeding  Patient will need surgery consult with [facility] to evaluate for possible colectomy given multiple episodes of diverticular bleeding within the last few years.  I have discussed alternative options, risks & benefits,  which include, but are not limited to, bleeding, infection, perforation,respiratory complication & drug reaction.  The patient agrees with this plan & written consent will be obtained.    Clear liquid diet today N.p.o. past midnight We will order colonoscopy prep   Thank you for involving me in the care of this patient.      LOS: 0 days   Virgel Manifold, MD  06/06/2018, 9:40 AM

## 2018-06-06 NOTE — ED Triage Notes (Signed)
Pt from home with report of gi bleeding with hx of same , A/Ox3

## 2018-06-06 NOTE — H&P (Addendum)
Ryan Allison is an 78 y.o. male.   Chief Complaint: GI bleed HPI: The patient with past medical history significant for diverticular bleeding, prostate cancer and hyperlipidemia presents to the emergency department complaining of bright red blood per rectum.  The patient awoke approximately 1 hour prior to arrival with the urgent since to defecate.  He mated to the restroom to have a large volume grossly bloody stool.  He had another episode of GI bleeding as well.  He began to feel faint and called EMS.  Upon arrival patient was pale and hypotensive.  Blood transfusion was given immediately which improved the patient's blood pressure and mentation.  Once he was stabilized the emergency department staff called the hospitalist service for admission.  Past Medical History:  Diagnosis Date  . Arthritis   . Cancer [facility]) 2011   prostate  . History of GI diverticular bleed    extended stay in the [facility] ICU due to GI bleeding prior to 2016  . Hyperlipidemia   . Intestinal bleeding   . Meniscus tear   . Prostate enlargement     Past Surgical History:  Procedure Laterality Date  . COLONOSCOPY WITH PROPOFOL N/A 04/06/2015   Procedure: COLONOSCOPY WITH PROPOFOL;  Surgeon: Lollie Sails, MD;  Location: [facility] ENDOSCOPY;  Service: Endoscopy;  Laterality: N/A;  . HERNIA REPAIR Left 09-23-14   inguinal  . INGUINAL HERNIA REPAIR Left 09/23/2014   Procedure: HERNIA REPAIR INGUINAL ADULT;  Surgeon: Christene Lye, MD;  Location: [facility] ORS;  Service: General;  Laterality: Left;  . MENISCUS REPAIR  2008  . VASCULAR SURGERY  2015    Family History  Problem Relation Age of Onset  . Cancer Father   . Cancer Brother    Social History:  reports that he has quit smoking. He has never used smokeless tobacco. He reports that he does not drink alcohol or use drugs.  Allergies:  Allergies  Allergen Reactions  . Nsaids Other (See Comments)    Diverticular bleed Diverticular bleed  . Tolmetin      Other reaction(s): Other (See Comments) Diverticular bleed Diverticular bleed  . Venlafaxine Other (See Comments)    sedation  . Celebrex [Celecoxib] Itching and Rash    Prior to Admission medications   Medication Sig Start Date End Date Taking? Authorizing Provider  Acetaminophen 500 MG coapsule Take 500 mg by mouth daily as needed.   Yes [provider]  bicalutamide (CASODEX) 50 MG tablet Take 50 mg by mouth daily. 05/16/18  Yes [provider]  cyanocobalamin 1000 MCG tablet Take 1,000 mcg by mouth daily.   Yes [provider]  docusate sodium (COLACE) 100 MG capsule Take 100 mg by mouth daily as needed.   Yes [provider]  gabapentin (NEURONTIN) 600 MG tablet Take 600 mg by mouth 4 (four) times daily.   Yes [provider]  omeprazole (PRILOSEC) 40 MG capsule Take 40 mg by mouth daily.   Yes [provider]  polyethylene glycol (MIRALAX) packet Take 17 g by mouth daily as needed.   Yes [provider]  simvastatin (ZOCOR) 20 MG tablet Take 20 mg by mouth daily.   Yes [provider]  tamsulosin (FLOMAX) 0.4 MG CAPS capsule Take 0.4 mg by mouth daily.    Yes [provider]  traMADol (ULTRAM) 50 MG tablet Take 50 mg by mouth every 6 (six) hours as needed for moderate pain.    Yes [provider]  NEOMYCIN-POLYMYXIN-HYDROCORTISONE (CORTISPORIN)  1 % SOLN OTIC solution Apply 1-2 drops to toe BID after soaking Patient not taking: Reported on 06/06/2018 04/11/17   Garrel Ridgel, DPM     Results for orders placed or performed during the hospital encounter of 06/06/18 (from the past 48 hour(s))  Comprehensive metabolic panel     Status: Abnormal   Collection Time: 06/06/18  6:07 AM  Result Value Ref Range   Sodium 138 135 - 145 mmol/L   Potassium 4.3 3.5 - 5.1 mmol/L   Chloride 106 98 - 111 mmol/L   CO2 28 22 - 32 mmol/L   Glucose, Bld 135 (H) 70 - 99 mg/dL   BUN 22 8 - 23 mg/dL   Creatinine, Ser  0.89 0.61 - 1.24 mg/dL   Calcium 8.5 (L) 8.9 - 10.3 mg/dL   Total Protein 6.4 (L) 6.5 - 8.1 g/dL   Albumin 3.6 3.5 - 5.0 g/dL   AST 17 15 - 41 U/L   ALT 14 0 - 44 U/L   Alkaline Phosphatase 61 38 - 126 U/L   Total Bilirubin 0.7 0.3 - 1.2 mg/dL   GFR calc non Af Amer >60 >60 mL/min   GFR calc Af Amer >60 >60 mL/min   Anion gap 4 (L) 5 - 15    Comment: Performed at [facility], [address] [place], [place] [address]  CBC with Differential     Status: Abnormal   Collection Time: 06/06/18  6:07 AM  Result Value Ref Range   WBC 5.0 4.0 - 10.5 K/uL   RBC 3.55 (L) 4.22 - 5.81 MIL/uL   Hemoglobin 11.8 (L) 13.0 - 17.0 g/dL   HCT 35.1 (L) 39.0 - 52.0 %   MCV 98.9 80.0 - 100.0 fL   MCH 33.2 26.0 - 34.0 pg   MCHC 33.6 30.0 - 36.0 g/dL   RDW 12.5 11.5 - 15.5 %   Platelets 209 150 - 400 K/uL   nRBC 0.0 0.0 - 0.2 %   Neutrophils Relative % 53 %   Neutro Abs 2.6 1.7 - 7.7 K/uL   Lymphocytes Relative 34 %   Lymphs Abs 1.7 0.7 - 4.0 K/uL   Monocytes Relative 9 %   Monocytes Absolute 0.5 0.1 - 1.0 K/uL   Eosinophils Relative 3 %   Eosinophils Absolute 0.1 0.0 - 0.5 K/uL   Basophils Relative 0 %   Basophils Absolute 0.0 0.0 - 0.1 K/uL   Immature Granulocytes 1 %   Abs Immature Granulocytes 0.03 0.00 - 0.07 K/uL    Comment: Performed at [facility], [address] [place], [place] [address]  Protime-INR     Status: None   Collection Time: 06/06/18  6:07 AM  Result Value Ref Range   Prothrombin Time 13.0 11.4 - 15.2 seconds   INR 0.99     Comment: Performed at [facility], [address] [place], [place] [address]  APTT     Status: None   Collection Time: 06/06/18  6:07 AM  Result Value Ref Range   aPTT 28 24 - 36 seconds    Comment: Performed at [facility], [address], [place], [place] [address]  Prepare RBC     Status: None   Collection Time: 06/06/18  6:08 AM  Result Value Ref Range   Order Confirmation      ORDER PROCESSED BY  BLOOD BANK Performed at [facility], [address] [place],  [address]   Type and screen Ordered  by PROVIDER DEFAULT     Status: None (Preliminary result)   Collection Time: 06/06/18  6:38 AM  Result Value Ref Range   ABO/RH(D) O NEG    Antibody Screen PENDING    Sample Expiration      06/09/2018 Performed at [facility], [address] [place], [place] [address]    No results found.  Review of Systems  Constitutional: Negative for chills and fever.  HENT: Negative for sore throat and tinnitus.   Eyes: Negative for blurred vision and redness.  Respiratory: Negative for cough and shortness of breath.   Cardiovascular: Negative for chest pain, palpitations, orthopnea and PND.  Gastrointestinal: Positive for blood in stool. Negative for abdominal pain, diarrhea, nausea and vomiting.  Genitourinary: Negative for dysuria, frequency and urgency.  Musculoskeletal: Negative for joint pain and myalgias.  Skin: Negative for rash.       No lesions  Neurological: Negative for speech change, focal weakness and weakness.  Endo/Heme/Allergies: Does not bruise/bleed easily.       No temperature intolerance  Psychiatric/Behavioral: Negative for depression and suicidal ideas.    Blood pressure 101/63, pulse 79, temperature 97.8 F (36.6 C), temperature source Oral, resp. rate 12, SpO2 100 %. Physical Exam  Constitutional: He is oriented to person, place, and time. He appears well-developed and well-nourished. No distress.  HENT:  Head: Normocephalic and atraumatic.  Mouth/Throat: Oropharynx is clear and moist.  Eyes: Pupils are equal, round, and reactive to light. Conjunctivae and EOM are normal. No scleral icterus.  Neck: Normal range of motion. Neck supple. No JVD present. No tracheal deviation present. No thyromegaly present.  Cardiovascular: Normal rate, regular rhythm and normal heart sounds. Exam reveals no gallop and no friction rub.  No murmur  heard. Respiratory: Effort normal and breath sounds normal. No respiratory distress.  GI: Soft. Bowel sounds are normal. He exhibits no distension. There is no abdominal tenderness.  Genitourinary:    Genitourinary Comments: Deferred   Musculoskeletal: Normal range of motion.        General: No edema.  Lymphadenopathy:    He has no cervical adenopathy.  Neurological: He is alert and oriented to person, place, and time. No cranial nerve deficit.  Skin: Skin is warm and dry. No rash noted. No erythema.  Psychiatric: He has a normal mood and affect. His behavior is normal. Judgment and thought content normal.     Assessment/Plan This is a 78 year old male admitted for GI bleed. 1.  GI bleed: Lower GI tract.  Continue blood transfusion.  Consult gastroenterology.  Maintain 2 large-bore IVs. 2.  GERD: Patient has history of arthritis.  Continue PPI 3.  Hyperlipidemia: Continue statin therapy once patient is able to have p.o. intake 4.  Enlarged prostate: With urinary retention.  Continue tamsulosin 5.  DVT prophylaxis: SCDs 6.  GI prophylaxis: As above The patient is a full code.  I have personally spent 45 minutes in critical care time with this patient  Harrie Foreman, MD 06/06/2018, 7:10 AM

## 2018-06-06 NOTE — ED Notes (Signed)
Floor unable to take report at this time.

## 2018-06-06 NOTE — Progress Notes (Signed)
Bright red blood and clots noted in pt's stool. NP notified, new orders placed. Will continue to monitor closely.

## 2018-06-06 NOTE — Consult Note (Signed)
Pt admitted to SDU via [facility] ED with hematochezia and transient hypotension. He received 2 units unmatched RBCs in the ED. His initial hemoglobin was 11.8. Repeat H/H ordered by me. If stable, he can go to a MedSurg floor. Planned colonoscopy in AM 01/24  Merton Border, MD PCCM service Mobile [phone] Pager [phone] 06/06/2018 2:45 PM

## 2018-06-06 NOTE — Consult Note (Addendum)
Cardiology Consultation:   Patient ID: [name] MRN: [id number]; DOB: 1940-05-31  Admit date: 06/06/2018 Date of Consult: 06/06/2018  Primary Care Provider: Adin Hector, MD Primary Cardiologist: Dr. Rockey Situ, [facility] Primary Electrophysiologist:  None    Patient Profile:   Ryan Allison is a 78 y.o. male with a hx of new onset atrial flutter 06/06/2018, aortic atherosclerosis, hyperlipidemia, OSA on CPAP and followed by Dr. [name] Landry, former smoker, GERD without esophagitis, h/o lower GI bleeding 1/15 d/t diverticulitis with need for transfusion and s/p embolization per Dr. Delana Meyer, recurrent prostate CA s/p radiation at [facility] 6/11, hemorrhoids, diverticulosis, colon tubular adenoma of the colon 03/2015, B12 deficiency, osteoarthritis/osteopenia/lumbar disc disease, peripheral polyneuropathy, depression/anxiety, strabismus, and skin cancer who is being seen today for the evaluation of atrial flutter with presumed new onset right bundle branch block in the setting of GI bleed at the request of Marda Stalker, NP.  History of Present Illness:   [name] is a 78 year old male with PMH as above and followed by internal medicine, hematology/oncology, ophthalmology, dermatology, and orthopedics at [facility] clinic. No previous cardiac history or known history of irregular HR. No known family h/o cardiac disease. Prior smoker. No recent alcohol or drug use.   Seen 04/08/2018 by PCP Dr. Ramonita Lab of [facility] for annual exam.  It was noted that he continue to follow with oncology for prostate cancer recurrence with rising PSA and upcoming imaging scheduled with [facility] and active hormonal treatment  (bicalutamide).  Neuropathy stable and taking B12. He reported increased difficulty with back and hip pain with MRI scheduled by orthopedics to r/o metastatic source and suspicion pain likely d/t nerve injury sustianed during combat in [place] / disc disease. Medications documented included Tylenol 500  mg daily PRN, gabapentin, omeprazole, simvastatin 20 mg (aortic atherosclerosis / HLD), Flomax, and tramadol. Labs: Hgb 15.5, Cr 0.9, K 4.5, Ca 9.4, AST 18, ALT 17, glucose 103, LDL 80, HDL 51. TSH 3.450.  Last colonoscopy 2016- As above, the patient has a h/o BRBPR, including an extended stay in the ICU 03/2015 due to acute blood loss from persistent diverticular bleed and need for transfusion. Last episode BRBPR reportedly at home ~ 1 month ago (04/2018).  He was reportedly told in the past that he would need surgery if additional episodes of this bleeding.  On 06/06/2018, he woke from sleep around 5AM with feeling of urgent need for a bowel movement. During that time, he noted large amount of BRBPR (x2 episodes). He hoped to ignore this bleeding and prevent a trip to the hospital, stating the episodes occasionally resolved on their own. However, he then began to feel symptoms of presyncope including weakness, dizziness, and lightheadedness. He also reported associated SOB/DOE, as well as feeling of racing heart rate and palpitations, all of which were aggravated / worsened with standing or exertion. No reported chest pain, but he did report diaphoresis or cold sweat. He stated that these symptoms he felt are the same as with previous acute bleeds. He has never had chest pain during a bleed.   He called 911 at the urging of his neighbor.  Upon paramedic arrival, he was observed to be weak and tachycardic with ventricular rate ~135.  He was brought to [facility] ED with new c/o reported at time of presentation of mild to moderate LLQ/abdominal pain / ache. He still reportedly felt the urge to have a bowel movement. No CP but continued feeling of tachycardia, palpitations, diaphoresis, fatigue, and weakness. Vitals  were significant for BP 146/120, HR 77, RR 19, T 97.80F, 99% ORA. ED EKG showed atrial flutter with ventricular rate 109, RBBB, LAFB, and LAD.  Physical exam noted an ill appearing male that was observed to  be pale, warm, and mildly diaphoretic. It also noted him as slow to respond and somewhat somnolent. BRBPR confirmed during the exam. Labs showed Hgb 11.8, RBC 3.55, HCT 35.1, glucose 135, Ca 8.5, anion gap 4. Troponin negative. TSH within limits. Pending A1C. While still in the ED, he was given 1 unit PRBC x2 and TXA. He was documented as hypotensive after the first unit, with with SBP dropping to 100  90s. Therefore, additional units of blood were ordered and he was started on NS with IV bolus in the ED. He was noted to have subsequent improvement in pallor and was stabilized after his second unit of blood. He was admitted to the ICU with GI and Cardiology consulted for atrial flutter management in the setting of acute GI bleed. He has not been placed on antiplatelets or anticoagulation. Hgb most recently 12.8.   He still denies CP as of our consultation but continues to report SOB, fatigue/weakness, and tachycardia/palpitations with moving around. He stated that his HR and palpitations improve "when he is just resting / lying in bed." He remains hemodynamically stable at this time.    Past Medical History:  Diagnosis Date  . Arthritis   . Cancer [facility]) 2011   prostate  . History of GI diverticular bleed    extended stay in the [facility] ICU due to GI bleeding prior to 2016  . Hyperlipidemia   . Intestinal bleeding   . Meniscus tear   . Prostate enlargement     Past Surgical History:  Procedure Laterality Date  . COLONOSCOPY WITH PROPOFOL N/A 04/06/2015   Procedure: COLONOSCOPY WITH PROPOFOL;  Surgeon: Lollie Sails, MD;  Location: [facility] ENDOSCOPY;  Service: Endoscopy;  Laterality: N/A;  . HERNIA REPAIR Left 09-23-14   inguinal  . INGUINAL HERNIA REPAIR Left 09/23/2014   Procedure: HERNIA REPAIR INGUINAL ADULT;  Surgeon: Christene Lye, MD;  Location: [facility] ORS;  Service: General;  Laterality: Left;  . MENISCUS REPAIR  2008  . VASCULAR SURGERY  2015     Home Medications:  Prior to  Admission medications   Medication Sig Start Date End Date Taking? Authorizing Provider  Acetaminophen 500 MG coapsule Take 500 mg by mouth daily as needed.   Yes [provider]  bicalutamide (CASODEX) 50 MG tablet Take 50 mg by mouth daily. 05/16/18  Yes [provider]  cyanocobalamin 1000 MCG tablet Take 1,000 mcg by mouth daily.   Yes [provider]  docusate sodium (COLACE) 100 MG capsule Take 100 mg by mouth daily as needed.   Yes [provider]  gabapentin (NEURONTIN) 600 MG tablet Take 600 mg by mouth 4 (four) times daily.   Yes [provider]  omeprazole (PRILOSEC) 40 MG capsule Take 40 mg by mouth daily.   Yes [provider]  polyethylene glycol (MIRALAX) packet Take 17 g by mouth daily as needed.   Yes [provider]  simvastatin (ZOCOR) 20 MG tablet Take 20 mg by mouth daily.   Yes [provider]  tamsulosin (FLOMAX) 0.4 MG CAPS capsule Take 0.4 mg by mouth daily.    Yes [provider]  traMADol (ULTRAM) 50 MG tablet Take 50 mg by mouth every 6 (six) hours as needed for moderate pain.  Yes [provider]  NEOMYCIN-POLYMYXIN-HYDROCORTISONE (CORTISPORIN) 1 % SOLN OTIC solution Apply 1-2 drops to toe BID after soaking Patient not taking: Reported on 06/06/2018 04/11/17   Garrel Ridgel, DPM    Inpatient Medications: Scheduled Meds: . pantoprazole (PROTONIX) IV  40 mg Intravenous Q12H  . polyethylene glycol-electrolytes  4,000 mL Oral Once   Continuous Infusions: . lactated ringers 50 mL/hr at 06/06/18 1153   PRN Meds: acetaminophen **OR** acetaminophen, ondansetron **OR** ondansetron (ZOFRAN) IV  Allergies:    Allergies  Allergen Reactions  . Nsaids Other (See Comments)    Diverticular bleed Diverticular bleed  . Tolmetin     Other reaction(s): Other (See Comments) Diverticular bleed Diverticular bleed  . Venlafaxine Other (See Comments)    sedation  . Celebrex [Celecoxib]  Itching and Rash    Social History:   Social History   Socioeconomic History  . Marital status: Single    Spouse name: Not on file  . Number of children: Not on file  . Years of education: Not on file  . Highest education level: Not on file  Occupational History  . Not on file  Social Needs  . Financial resource strain: Not on file  . Food insecurity:    Worry: Not on file    Inability: Not on file  . Transportation needs:    Medical: Not on file    Non-medical: Not on file  Tobacco Use  . Smoking status: Former Research scientist (life sciences)  . Smokeless tobacco: Never Used  Substance and Sexual Activity  . Alcohol use: No  . Drug use: No  . Sexual activity: Not on file  Lifestyle  . Physical activity:    Days per week: Not on file    Minutes per session: Not on file  . Stress: Not on file  Relationships  . Social connections:    Talks on phone: Not on file    Gets together: Not on file    Attends religious service: Not on file    Active member of club or organization: Not on file    Attends meetings of clubs or organizations: Not on file    Relationship status: Not on file  . Intimate partner violence:    Fear of current or ex partner: Not on file    Emotionally abused: Not on file    Physically abused: Not on file    Forced sexual activity: Not on file  Other Topics Concern  . Not on file  Social History Narrative  . Not on file    Family History:    Family History  Problem Relation Age of Onset  . Cancer Father   . Cancer Brother      ROS:  Please see the history of present illness.  Review of Systems  Constitutional: Positive for diaphoresis and malaise/fatigue. Negative for chills and fever.  Respiratory: Positive for shortness of breath.   Cardiovascular: Positive for palpitations. Negative for chest pain and leg swelling.  Gastrointestinal: Positive for abdominal pain and blood in stool.       Urgent need for bowel movement  Genitourinary:       BRBPR with large  amount of blood in toilet Urgent need for bowel movement  Musculoskeletal: Negative for falls.       Denies any syncope  Neurological: Positive for dizziness and weakness.  Psychiatric/Behavioral:       Stated he is not able to think clearly in setting of bleed  All other systems reviewed and are  negative.   All other ROS reviewed and negative.     Physical Exam/Data:   Vitals:   06/06/18 0900 06/06/18 1000 06/06/18 1004 06/06/18 1100  BP: 121/73 124/60 138/86 112/62  Pulse: (!) 51 99  (!) 39  Resp: 12 16  15   Temp:      TempSrc:      SpO2:      Weight:      Height:        Intake/Output Summary (Last 24 hours) at 06/06/2018 1510 Last data filed at 06/06/2018 1424 Gross per 24 hour  Intake 360 ml  Output 600 ml  Net -240 ml   Filed Weights   06/06/18 0828  Weight: 90.3 kg   Body mass index is 28.56 kg/m.  General:  Pale, weak, and fatigued male, lying in bed. Noted as somewhat sob, especially when speaking HEENT: normal, pale conjunctiva  Neck: no JVD Vascular: No carotid bruits; FA pulses 2+ bilaterally without bruits  Cardiac:  normal S1, S2; tachycardic with ectopy irregularity heard/ atrial flutter with variable block; no murmur Lungs:  clear to auscultation bilaterally, no wheezing, rhonchi or rales  Abd: soft, nontender, no hepatomegaly  Ext: no bilateral LE edema Musculoskeletal:  No deformities, weak Skin: warm and dry  Neuro:  no focal abnormalities noted Psych:  Normal affect, fatigued   EKG:  The EKG was personally reviewed and demonstrates:  EKG showed atrial flutter, ventricular rate 109, RBBB, and LAFB, and LAD.   Telemetry:  Telemetry was personally reviewed and demonstrates:  Aflutter with variable block v ectopy and rates 80s-140s  Relevant CV Studies:  Pending TTE  Laboratory Data:  Chemistry Recent Labs  Lab 06/06/18 0607  NA 138  K 4.3  CL 106  CO2 28  GLUCOSE 135*  BUN 22  CREATININE 0.89  CALCIUM 8.5*  GFRNONAA >60  GFRAA >60   ANIONGAP 4*    Recent Labs  Lab 06/06/18 0607  PROT 6.4*  ALBUMIN 3.6  AST 17  ALT 14  ALKPHOS 61  BILITOT 0.7   Hematology Recent Labs  Lab 06/06/18 0607  WBC 5.0  RBC 3.55*  HGB 11.8*  HCT 35.1*  MCV 98.9  MCH 33.2  MCHC 33.6  RDW 12.5  PLT 209   Cardiac Enzymes Recent Labs  Lab 06/06/18 0607  TROPONINI <0.03   No results for input(s): TROPIPOC in the last 168 hours.  BNPNo results for input(s): BNP, PROBNP in the last 168 hours.  DDimer No results for input(s): DDIMER in the last 168 hours.  Radiology/Studies:  No results found.  Assessment and Plan:   1. Atrial Flutter of unknown onset and presumed to be new in the setting of acute lower GI bleed -Symptomatic with SOB, diaphoresis, palpitations, weakness, and dizziness. Symptoms likely d/t combination of blood loss, hypotension, and atrial flutter with rapid rate. At rate, patient has ventricular rate of ~80-100, increasing to 140s with exertion (per telemetry). Previous EKG from 04/2018 shows incomplete LAFB and RBBB with RAD. Most recent EKG 1/23 shows LAFB, RBBB, LAD and Aflutter with rate of 109. Ordered repeat EKGs. Continue to cycle. TSH within limits. A1C ordered. Echo ordered. - CHA2DS2VASc score of at least 3 (agex2, vascular disease). Due to active GI bleed, as well a h/o frequent GI bleeds despite embolization procedure by vascular surgery, would not recommend anticoagulation for this patient.  Daily CBC recommended. Given that bleed started at 5AM, repeat CBC ordered for 9PM. S/p 2 units as below -Hgb 12.8.  Transfuse if Hgb below 8 or sx/ unstable.  No anticoagulation or antiplatelets in setting of active GI bleed. Unclear if new onset atrial flutter and cannot guarantee onset within last 24-48h. Given unstable status, will continue to monitor as could convert back to NSR on his own. If need to cardiovert, will need TEE/DCCV as not therapeutically anticoagulated and risk of thrombosis/stroke. Note that  starting an amiodarone drip also carries risk of thrombosis / stroke d/t pharmacologic cardioversion.  - Could consider rate control with cardizem drip with close monitoring of HR/BP and as tolerated by patient. Echo pending to assess EF as would not recommend if low BP or HF as could exacerbate. If suspect HF, and continued stable renal function, could consider low dose digoxin given low BP with consideration of its poor exertional HR control. Would not recommend BB given sx of SOB/DOE and hypotension / bifasicular block. Would not recommend amiodarone as above. - Repeat EKG ordered.Further recommendations following echo and pending MD to see patient. As above, suspect that Aflutter in setting of acute bleed.  Would recommend outpatient follow-up and labs in office.   2. Acute blood loss anemia secondary to lower GI bleed -H/o recurrent bleed as in HPI. Latest Hgb at 12.8  S/p 2 pRBC. Trend Hgb as above. Scheduled for colonoscopy. Transfuse for Hgb <8 or if unstable. No recommendations for antiplatelets / anticoagulants. Agree with PPI. Remainder as directly above and further workup for etiology of bleed per GI/surgery.  3. Malignant neoplasm of prostate - Follows with oncology at [facility], receiving active hormonal tx   4. HLD - PTA statin, currently held. LDL 80. Followed closely by PCP. Would restart of statin before discharge and once patient able to tolerate oral medications. Liver function stable.   5. Labile BP / hypotension in setting of acute bleed - Continue to monitor. Avoid medications that may exacerbate. - Daily BMET (Cr 0.89) to monitor renal function and electrolytes. K 4.3. Renal function stable. Avoid prerenal injury d/t low pressures.    For questions or updates, please contact [place] Please consult [email or web address] for contact info under     Signed, Arvil Chaco, PA-C  06/06/2018 3:10 PM

## 2018-06-06 NOTE — Progress Notes (Signed)
Patient admitted early this morning for GI bleed.  Seen and examined by me later in the morning.  Patient states he is overall feeling little better after receiving a blood transfusion.  He has not had any additional episodes of GI bleeding.  He endorses some mild left lower quadrant "cramping".  On exam, he is somewhat pale.  He has conjunctival pallor.  Regular rate and rhythm.  Lungs clear to auscultation bilaterally. + Mild tenderness to palpation of the left lower quadrant, no rebound or guarding.  -Agree with admission H&P -Seen by GI this morning, who recommended IV PPI twice daily, serial CBCs.  Will plan for colonoscopy if he has ongoing bleeding.  Hyman Bible, MD

## 2018-06-07 ENCOUNTER — Encounter
Admission: EM | Disposition: A | Discharge status: Home / Self Care | Payer: Self-pay | Source: Home / Self Care | Attending: Internal Medicine

## 2018-06-07 ENCOUNTER — Inpatient Hospital Stay: Payer: Medicare Other | Admitting: Anesthesiology

## 2018-06-07 ENCOUNTER — Inpatient Hospital Stay: Admit: 2018-06-07 | Payer: Medicare Other

## 2018-06-07 ENCOUNTER — Inpatient Hospital Stay (HOSPITAL_COMMUNITY)
Admit: 2018-06-07 | Discharge: 2018-06-07 | Disposition: A | Discharge status: Home / Self Care | Payer: Medicare Other | Attending: Physician Assistant | Admitting: Physician Assistant

## 2018-06-07 DIAGNOSIS — K922 Gastrointestinal hemorrhage, unspecified: Secondary | ICD-10-CM

## 2018-06-07 DIAGNOSIS — K626 Ulcer of anus and rectum: Secondary | ICD-10-CM

## 2018-06-07 DIAGNOSIS — I361 Nonrheumatic tricuspid (valve) insufficiency: Secondary | ICD-10-CM

## 2018-06-07 DIAGNOSIS — C61 Malignant neoplasm of prostate: Secondary | ICD-10-CM

## 2018-06-07 DIAGNOSIS — K5730 Diverticulosis of large intestine without perforation or abscess without bleeding: Secondary | ICD-10-CM

## 2018-06-07 DIAGNOSIS — K921 Melena: Secondary | ICD-10-CM

## 2018-06-07 DIAGNOSIS — K648 Other hemorrhoids: Secondary | ICD-10-CM

## 2018-06-07 HISTORY — PX: COLONOSCOPY: SHX5424

## 2018-06-07 HISTORY — PX: COLONOSCOPY WITH PROPOFOL: SHX5780

## 2018-06-07 LAB — BASIC METABOLIC PANEL
ANION GAP: 6 (ref 5–15)
BUN: 13 mg/dL (ref 8–23)
CALCIUM: 8.5 mg/dL — AB (ref 8.9–10.3)
CO2: 27 mmol/L (ref 22–32)
Chloride: 106 mmol/L (ref 98–111)
Creatinine, Ser: 0.71 mg/dL (ref 0.61–1.24)
GFR calc Af Amer: >60 mL/min (ref >60)
GFR calc non Af Amer: >60 mL/min (ref >60)
Glucose, Bld: 132 mg/dL — ABNORMAL HIGH (ref 70–99)
Potassium: 3.8 mmol/L (ref 3.5–5.1)
Sodium: 139 mmol/L (ref 135–145)

## 2018-06-07 LAB — CBC
HCT: 33.4 % — ABNORMAL LOW (ref 39.0–52.0)
Hemoglobin: 11.3 g/dL — ABNORMAL LOW (ref 13.0–17.0)
MCH: 32.2 pg (ref 26.0–34.0)
MCHC: 33.8 g/dL (ref 30.0–36.0)
MCV: 95.2 fL (ref 80.0–100.0)
PLATELETS: 198 10*3/uL (ref 150–400)
RBC: 3.51 MIL/uL — ABNORMAL LOW (ref 4.22–5.81)
RDW: 13.5 % (ref 11.5–15.5)
WBC: 6.8 10*3/uL (ref 4.0–10.5)
nRBC: 0 % (ref 0.0–0.2)

## 2018-06-07 LAB — HEMOGLOBIN AND HEMATOCRIT, BLOOD
HCT: 38 % — ABNORMAL LOW (ref 39.0–52.0)
Hemoglobin: 12.8 g/dL — ABNORMAL LOW (ref 13.0–17.0)

## 2018-06-07 LAB — ECHOCARDIOGRAM COMPLETE
HEIGHTINCHES: 70 in
Weight: 3100.55 oz

## 2018-06-07 LAB — TROPONIN I: Troponin I: <0.03 ng/mL (ref <0.03)

## 2018-06-07 SURGERY — COLONOSCOPY WITH PROPOFOL
Anesthesia: General

## 2018-06-07 SURGERY — COLONOSCOPY
Anesthesia: General | Laterality: Left

## 2018-06-07 MED ORDER — SIMVASTATIN 20 MG PO TABS: 20.0000 mg | ORAL_TABLET | Freq: Every day | ORAL | Status: DC

## 2018-06-07 MED ORDER — GABAPENTIN 600 MG PO TABS
300.0000 mg | ORAL_TABLET | Freq: Two times a day (BID) | ORAL | Status: DC
  Administered 2018-06-07 – 2018-06-08 (×2): 300 mg via ORAL
  Filled 2018-06-07 (×2): qty 1
  Filled 2018-06-07 (×2): qty 0.5

## 2018-06-07 MED ORDER — TRAZODONE HCL 50 MG PO TABS
50.0000 mg | ORAL_TABLET | Freq: Every evening | ORAL | Status: DC | PRN
  Administered 2018-06-07: 50 mg via ORAL
  Filled 2018-06-07: qty 1

## 2018-06-07 MED ORDER — VITAMIN B-12 1000 MCG PO TABS: 1000.0000 ug | ORAL_TABLET | Freq: Every day | ORAL | Status: DC

## 2018-06-07 MED ORDER — TAMSULOSIN HCL 0.4 MG PO CAPS
0.4000 mg | ORAL_CAPSULE | Freq: Every day | ORAL | Status: DC
  Administered 2018-06-07 – 2018-06-08 (×2): 0.4 mg via ORAL
  Filled 2018-06-07 (×2): qty 1

## 2018-06-07 MED ORDER — ALPRAZOLAM 0.25 MG PO TABS: 0.2500 mg | ORAL_TABLET | Freq: Once | ORAL | Status: DC

## 2018-06-07 MED ORDER — DILTIAZEM HCL ER COATED BEADS 180 MG PO CP24
180.0000 mg | ORAL_CAPSULE | Freq: Every day | ORAL | Status: DC
  Administered 2018-06-07 – 2018-06-08 (×2): 180 mg via ORAL
  Filled 2018-06-07 (×2): qty 1

## 2018-06-07 MED ORDER — PROPOFOL 10 MG/ML IV BOLUS
INTRAVENOUS | Status: AC
  Filled 2018-06-07: qty 20

## 2018-06-07 MED ORDER — ATORVASTATIN CALCIUM 20 MG PO TABS
20.0000 mg | ORAL_TABLET | Freq: Every day | ORAL | Status: DC
  Administered 2018-06-07: 20 mg via ORAL
  Filled 2018-06-07: qty 1

## 2018-06-07 MED ORDER — DOCUSATE SODIUM 100 MG PO CAPS: 100.0000 mg | ORAL_CAPSULE | Freq: Every day | ORAL | Status: DC | PRN

## 2018-06-07 MED ORDER — PROPOFOL 500 MG/50ML IV EMUL
INTRAVENOUS | Status: DC | PRN
  Administered 2018-06-07: 75 ug/kg/min via INTRAVENOUS

## 2018-06-07 MED ORDER — POLYETHYLENE GLYCOL 3350 17 G PO PACK: 17.0000 g | PACK | Freq: Every day | ORAL | Status: DC | PRN

## 2018-06-07 MED ORDER — LORAZEPAM 2 MG/ML IJ SOLN
1.0000 mg | Freq: Once | INTRAMUSCULAR | Status: AC
  Administered 2018-06-07: 1 mg via INTRAVENOUS
  Filled 2018-06-07: qty 1

## 2018-06-07 MED ORDER — BICALUTAMIDE 50 MG PO TABS
50.0000 mg | ORAL_TABLET | Freq: Every day | ORAL | Status: DC
  Administered 2018-06-07 – 2018-06-08 (×2): 50 mg via ORAL
  Filled 2018-06-07 (×2): qty 1

## 2018-06-07 MED ORDER — VITAMIN B-12 1000 MCG PO TABS
1000.0000 ug | ORAL_TABLET | Freq: Every day | ORAL | Status: DC
  Administered 2018-06-07 – 2018-06-08 (×2): 1000 ug via ORAL
  Filled 2018-06-07 (×2): qty 1

## 2018-06-07 MED ORDER — PANTOPRAZOLE SODIUM 40 MG PO TBEC: 40.0000 mg | DELAYED_RELEASE_TABLET | Freq: Every day | ORAL | Status: DC

## 2018-06-07 MED ORDER — PROPOFOL 10 MG/ML IV BOLUS
INTRAVENOUS | Status: DC | PRN
  Administered 2018-06-07: 10 mg via INTRAVENOUS
  Administered 2018-06-07 (×2): 20 mg via INTRAVENOUS

## 2018-06-07 NOTE — Progress Notes (Signed)
*  PRELIMINARY RESULTS* Echocardiogram 2D Echocardiogram has been performed.  Ryan Allison 06/07/2018, 11:06 AM

## 2018-06-07 NOTE — Transfer of Care (Signed)
Immediate Anesthesia Transfer of Care Note  Patient: Ryan Allison  Procedure(s) Performed: COLONOSCOPY (Left )  Patient Location: PACU and Endoscopy Unit  Anesthesia Type:  Level of Consciousness: awake  Airway & Oxygen Therapy: Patient Spontanous Breathing  Post-op Assessment: Post -op Vital signs reviewed and stable  Post vital signs: Reviewed  Last Vitals:  Vitals Value Taken Time  BP    Temp    Pulse    Resp    SpO2      Last Pain:  Vitals:   06/07/18 0857  TempSrc: Tympanic  PainSc: 0-No pain         Complications: No apparent anesthesia complications

## 2018-06-07 NOTE — Progress Notes (Signed)
   Vonda Antigua, MD [address], [place], [place], [place], [address] [address], [place], [place], [place], [address] Phone: [phone]  Fax: [phone]   Subjective:  Hemoglobin stable. No further bleeding with the prep. Output was tea colored as per patient. No Abdominal pain. N/V  Objective: Exam: Vital signs in last 24 hours: Vitals:   06/07/18 0500 06/07/18 0600 06/07/18 0744 06/07/18 0857  BP: (!) 142/84 (!) 149/74  (!) 142/73  Pulse: 82 95  88  Resp: 14 16  16   Temp:   97.9 F (36.6 C)   TempSrc:   Oral Tympanic  SpO2: 100% 98%  100%  Weight: 87.9 kg   87.9 kg  Height:    5\' 10"  (1.778 m)   Weight change:   Intake/Output Summary (Last 24 hours) at 06/07/2018 0910 Last data filed at 06/07/2018 1423 Gross per 24 hour  Intake 1258.84 ml  Output 1325 ml  Net -66.16 ml    General: No acute distress, AAO x3 Abd: Soft, NT/ND, No HSM Skin: Warm, no rashes Neck: Supple, Trachea midline   Lab Results: Lab Results  Component Value Date   WBC 6.8 06/07/2018   HGB 11.3 (L) 06/07/2018   HCT 33.4 (L) 06/07/2018   MCV 95.2 06/07/2018   PLT 198 06/07/2018   Micro Results: Recent Results (from the past 240 hour(s))  MRSA PCR Screening     Status: None   Collection Time: 06/06/18  8:31 AM  Result Value Ref Range Status   MRSA by PCR NEGATIVE NEGATIVE Final    Comment:        The GeneXpert MRSA Assay (FDA approved for NASAL specimens only), is one component of a comprehensive MRSA colonization surveillance program. It is not intended to diagnose MRSA infection nor to guide or monitor treatment for MRSA infections. Performed at [facility], [address], [place], [place] [address]    Studies/Results: No results found. Medications:  Scheduled Meds: . [MAR Hold] pantoprazole (PROTONIX) IV  40 mg Intravenous Q12H   Continuous Infusions: . sodium chloride 20 mL/hr at 06/07/18 0909  . [MAR Hold] diltiazem (CARDIZEM) infusion 5  mg/hr (06/06/18 1905)  . lactated ringers 50 mL/hr at 06/07/18 0736   PRN Meds:.[MAR Hold] acetaminophen **OR** [MAR Hold] acetaminophen, [MAR Hold] ondansetron **OR** [MAR Hold] ondansetron (ZOFRAN) IV   Assessment: Active Problems:   GI bleed    Plan: Will proceed with colonoscopy today to evaluate etiology of bleeding. Likely diverticular.   If colonoscopy is not suggestive of source of bleeding, EGD and/or pill camera would be next step and pt is agreeable for this.  PPI IV twice daily  Continue serial CBCs and transfuse PRN Avoid NSAIDs Maintain 2 large-bore IV lines Please page GI with any acute hemodynamic changes, or signs of active GI bleeding   I have discussed alternative options, risks & benefits,  which include, but are not limited to, bleeding, infection, perforation,respiratory complication & drug reaction.  The patient agrees with this plan & written consent will be obtained.      LOS: 1 day   Vonda Antigua, MD 06/07/2018, 9:10 AM

## 2018-06-07 NOTE — Progress Notes (Signed)
Colonoscopy findings noted. General surgery consultation placed. Tranfer to Tele floor and PCCM will sign off  Merton Border, MD PCCM service Mobile [phone] Pager [phone] 06/07/2018 3:27 PM

## 2018-06-07 NOTE — Anesthesia Preprocedure Evaluation (Signed)
Anesthesia Evaluation  Patient identified by MRN, date of birth, ID band Patient awake    Reviewed: Allergy & Precautions, NPO status , Patient's Chart, lab work & pertinent test results  History of Anesthesia Complications Negative for: history of anesthetic complications  Airway Mallampati: III  TM Distance: >3 FB Neck ROM: Full    Dental no notable dental hx.    Pulmonary neg sleep apnea, neg COPD, former smoker,    breath sounds clear to auscultation- rhonchi (-) wheezing      Cardiovascular (-) hypertension(-) CAD, (-) Past MI, (-) Cardiac Stents and (-) CABG + dysrhythmias Atrial Fibrillation  Rhythm:Regular Rate:Normal - Systolic murmurs and - Diastolic murmurs Echo 08/24/85: EF normal   Neuro/Psych neg Seizures negative neurological ROS  negative psych ROS   GI/Hepatic Neg liver ROS, GERD  ,GIB    Endo/Other  negative endocrine ROSneg diabetes  Renal/GU negative Renal ROS     Musculoskeletal  (+) Arthritis ,   Abdominal (+) - obese,   Peds  Hematology negative hematology ROS (+)   Anesthesia Other Findings Past Medical History: No date: Arthritis 2011: Cancer ([place])     Comment:  prostate No date: History of GI diverticular bleed     Comment:  extended stay in the [facility] ICU due to GI bleeding prior               to 2016 No date: Hyperlipidemia No date: Intestinal bleeding No date: Meniscus tear No date: Prostate enlargement   Reproductive/Obstetrics                             Anesthesia Physical  Anesthesia Plan  ASA: III  Anesthesia Plan: General   Post-op Pain Management:    Induction: Intravenous  PONV Risk Score and Plan: 1 and Propofol infusion  Airway Management Planned: Natural Airway  Additional Equipment:   Intra-op Plan:   Post-operative Plan:   Informed Consent: I have reviewed the patients History and Physical, chart, labs and discussed the  procedure including the risks, benefits and alternatives for the proposed anesthesia with the patient or authorized representative who has indicated his/her understanding and acceptance.     Dental advisory given  Plan Discussed with: CRNA and Anesthesiologist  Anesthesia Plan Comments: (Discussed pt with cardiology, echo done today with normal EF, plan for rate control only at this time since pt is not candidate for anticoagulation due to GIB, HR 80-90s currently controlled with diltiazem)        Anesthesia Quick Evaluation

## 2018-06-07 NOTE — Progress Notes (Signed)
Pt accepted in transfer from ccu. He is alert, oriented painfree. Friend at bedside. Tele box verified. Vss.  Pt oriented to room.

## 2018-06-07 NOTE — Anesthesia Post-op Follow-up Note (Signed)
Anesthesia QCDR form completed.        

## 2018-06-07 NOTE — Progress Notes (Signed)
[facility] [place] NAME: Ryan Allison    MR#:  [id number]  DATE OF BIRTH:  06/13/1940  SUBJECTIVE:   Doing well this afternoon after his colonoscopy.  Denies any chest pain, shortness of breath, palpitations, dizziness.  REVIEW OF SYSTEMS:  Review of Systems  Constitutional: Negative for chills and fever.  HENT: Negative for congestion and sore throat.   Eyes: Negative for blurred vision and double vision.  Respiratory: Negative for cough and sputum production.   Cardiovascular: Negative for chest pain and palpitations.  Gastrointestinal: Positive for abdominal pain and blood in stool. Negative for nausea and vomiting.  Genitourinary: Negative for dysuria, hematuria and urgency.  Musculoskeletal: Negative for back pain and neck pain.  Neurological: Negative for dizziness and headaches.  Psychiatric/Behavioral: Negative for depression. The patient is not nervous/anxious.     DRUG ALLERGIES:   Allergies  Allergen Reactions  . Nsaids Other (See Comments)    Diverticular bleed Diverticular bleed  . Tolmetin     Other reaction(s): Other (See Comments) Diverticular bleed Diverticular bleed  . Venlafaxine Other (See Comments)    sedation  . Celebrex [Celecoxib] Itching and Rash   VITALS:  Blood pressure (!) 113/97, pulse 87, temperature (!) 97.2 F (36.2 C), temperature source Tympanic, resp. rate 15, height 5\' 10"  (1.778 m), weight 87.9 kg, SpO2 100 %. PHYSICAL EXAMINATION:  Physical Exam  Constitutional: Laying in bed in no acute distress HEENT: Normocephalic, atraumatic, EOMI, no scleral icterus, oropharynx clear, moist mucous membranes Neck:  Supple, normal range of motion, no JVD Cardiovascular:  Regularly irregular rhythm, regular rate, no murmurs Respiratory: Effort normal and breath sounds normal. No respiratory distress.  GI:  Soft, nontender, nondistended.  Bowel sounds present. Musculoskeletal:  No edema, cyanosis,  clubbing Neurological: CN II through XII grossly intact,+ global weakness, sensation intact throughout, no focal deficit Skin: Skin is warm and dry. No rash noted. No erythema.  Psychiatric: He has a normal mood and affect. His behavior is normal. Judgment and thought content normal.  LABORATORY PANEL:  Male CBC Recent Labs  Lab 06/07/18 0536  WBC 6.8  HGB 11.3*  HCT 33.4*  PLT 198   ------------------------------------------------------------------------------------------------------------------ Chemistries  Recent Labs  Lab 06/06/18 0607 06/07/18 0536  NA 138 139  K 4.3 3.8  CL 106 106  CO2 28 27  GLUCOSE 135* 132*  BUN 22 13  CREATININE 0.89 0.71  CALCIUM 8.5* 8.5*  AST 17  --   ALT 14  --   ALKPHOS 61  --   BILITOT 0.7  --    RADIOLOGY:  No results found. ASSESSMENT AND PLAN:   1.  GI bleed- likely diverticular bleed.  Hemoglobin 11.3 today. -Colonoscopy today -GI following -IV PPI  2.  New onset atrial flutter with rvr- rvr has resolved -Cardiology consult -Change from diltiazem drip to p.o. diltiazem -Not a candidate for anticoagulation at this time  2.  GERD -PPI as above  3.  Hyperlipidemia-stable -Continue statin  4.    BPH/prostate cancer- s/p radiation in 2011.   -Continue Flomax  5.  DVT prophylaxis: SCDs  All the records are reviewed and case discussed with Care Management/Social Worker. Management plans discussed with the patient, family and they are in agreement.  CODE STATUS: Full Code  TOTAL TIME TAKING CARE OF THIS PATIENT: 35 minutes.   More than 50% of the time was spent in counseling/coordination of care: YES  POSSIBLE D/C IN 1-2 DAYS, DEPENDING  ON CLINICAL CONDITION.   Berna Spare Bodin Gorka M.D on 06/07/2018 at 2:21 PM  Between 7am to 6pm - Pager [phone]  After 6pm go to [email or web address] - password [email or web address] [facility]  [facility]  Office  [phone]  CC: Primary care physician; Adin Hector,  MD  Note: This dictation was prepared with Dragon dictation along with smaller phrase technology. Any transcriptional errors that result from this process are unintentional.

## 2018-06-07 NOTE — Transfer of Care (Signed)
Immediate Anesthesia Transfer of Care Note  Patient: Ryan Allison  Procedure(s) Performed: COLONOSCOPY WITH PROPOFOL (N/A )  Patient Location: PACU and Endoscopy Unit  Anesthesia Type:MAC  Level of Consciousness: awake  Airway & Oxygen Therapy: Patient Spontanous Breathing  Post-op Assessment: Report given to RN  Post vital signs: Reviewed  Last Vitals:  Vitals Value Taken Time  BP 116/71 06/07/2018  1:32 PM  Temp 36.2 C 06/07/2018  1:32 PM  Pulse 89 06/07/2018  1:35 PM  Resp 12 06/07/2018  1:35 PM  SpO2 100 % 06/07/2018  1:35 PM  Vitals shown include unvalidated device data.  Last Pain:  Vitals:   06/07/18 1332  TempSrc: Tympanic  PainSc: 0-No pain         Complications: No apparent anesthesia complications

## 2018-06-07 NOTE — Consult Note (Signed)
SURGICAL CONSULTATION NOTE   HISTORY OF PRESENT ILLNESS (HPI):  78 y.o. male presented to [facility] ED for evaluation of rectal bleeding. Patient reports has had multiple episodes of rectal bleeding in the last few year. He refers that two of them had required admission to ICU. The patient refers that two days ago had two large bowel movement that were all blood. He denies dizziness, chest pain or shortness of breath. Bleeding was painless. No pain radiation. No alleviator or aggravator factor. Patient admitted by Medicine. Colonoscopy done today and found with very tiny spots suggestive of area of bleeding. The proximal large intestine was clean without sign of upper GI bleeding.   Surgery is consulted by Dr. Jamal Collin in this context for evaluation and management of recurrent diverticular bleeding.  PAST MEDICAL HISTORY (PMH):  Past Medical History:  Diagnosis Date  . Arthritis   . Cancer [facility]) 2011   prostate  . History of GI diverticular bleed    extended stay in the [facility] ICU due to GI bleeding prior to 2016  . Hyperlipidemia   . Intestinal bleeding   . Meniscus tear   . Prostate enlargement      PAST SURGICAL HISTORY [facility]):  Past Surgical History:  Procedure Laterality Date  . COLONOSCOPY WITH PROPOFOL N/A 04/06/2015   Procedure: COLONOSCOPY WITH PROPOFOL;  Surgeon: Lollie Sails, MD;  Location: [facility] ENDOSCOPY;  Service: Endoscopy;  Laterality: N/A;  . HERNIA REPAIR Left 09-23-14   inguinal  . INGUINAL HERNIA REPAIR Left 09/23/2014   Procedure: HERNIA REPAIR INGUINAL ADULT;  Surgeon: Christene Lye, MD;  Location: [facility] ORS;  Service: General;  Laterality: Left;  . MENISCUS REPAIR  2008  . VASCULAR SURGERY  2015     MEDICATIONS:  Prior to Admission medications   Medication Sig Start Date End Date Taking? Authorizing Provider  Acetaminophen 500 MG coapsule Take 500 mg by mouth daily as needed.   Yes [provider]  bicalutamide (CASODEX) 50 MG tablet Take 50 mg  by mouth daily. 05/16/18  Yes [provider]  cyanocobalamin 1000 MCG tablet Take 1,000 mcg by mouth daily.   Yes [provider]  docusate sodium (COLACE) 100 MG capsule Take 100 mg by mouth daily as needed.   Yes [provider]  gabapentin (NEURONTIN) 600 MG tablet Take 600 mg by mouth 4 (four) times daily.   Yes [provider]  omeprazole (PRILOSEC) 40 MG capsule Take 40 mg by mouth daily.   Yes [provider]  polyethylene glycol (MIRALAX) packet Take 17 g by mouth daily as needed.   Yes [provider]  simvastatin (ZOCOR) 20 MG tablet Take 20 mg by mouth daily.   Yes [provider]  tamsulosin (FLOMAX) 0.4 MG CAPS capsule Take 0.4 mg by mouth daily.    Yes [provider]  traMADol (ULTRAM) 50 MG tablet Take 50 mg by mouth every 6 (six) hours as needed for moderate pain.    Yes [provider]  NEOMYCIN-POLYMYXIN-HYDROCORTISONE (CORTISPORIN) 1 % SOLN OTIC solution Apply 1-2 drops to toe BID after soaking Patient not taking: Reported on 06/06/2018 04/11/17   Garrel Ridgel, DPM     ALLERGIES:  Allergies  Allergen Reactions  . Nsaids Other (See Comments)    Diverticular bleed Diverticular bleed  . Tolmetin     Other reaction(s): Other (See Comments) Diverticular bleed Diverticular bleed  . Venlafaxine Other (See Comments)    sedation  . Celebrex [Celecoxib] Itching and Rash  SOCIAL HISTORY:  Social History   Socioeconomic History  . Marital status: Single    Spouse name: Not on file  . Number of children: Not on file  . Years of education: Not on file  . Highest education level: Not on file  Occupational History  . Not on file  Social Needs  . Financial resource strain: Not on file  . Food insecurity:    Worry: Not on file    Inability: Not on file  . Transportation needs:    Medical: Not on file    Non-medical: Not on file  Tobacco Use  . Smoking status: Former Research scientist (life sciences)  . Smokeless  tobacco: Never Used  Substance and Sexual Activity  . Alcohol use: No  . Drug use: No  . Sexual activity: Not on file  Lifestyle  . Physical activity:    Days per week: Not on file    Minutes per session: Not on file  . Stress: Not on file  Relationships  . Social connections:    Talks on phone: Not on file    Gets together: Not on file    Attends religious service: Not on file    Active member of club or organization: Not on file    Attends meetings of clubs or organizations: Not on file    Relationship status: Not on file  . Intimate partner violence:    Fear of current or ex partner: Not on file    Emotionally abused: Not on file    Physically abused: Not on file    Forced sexual activity: Not on file  Other Topics Concern  . Not on file  Social History Narrative  . Not on file    The patient currently resides (home / rehab facility / nursing home): Home The patient normally is (ambulatory / bedbound): Ambulatory   FAMILY HISTORY:  Family History  Problem Relation Age of Onset  . Cancer Father   . Cancer Brother      REVIEW OF SYSTEMS:  Constitutional: denies weight loss, fever, chills, or sweats  Eyes: denies any other vision changes, history of eye injury  ENT: denies sore throat, hearing problems  Respiratory: denies shortness of breath, wheezing  Cardiovascular: denies chest pain, palpitations  Gastrointestinal: denies abdominal pain, N/V, or diarrhea Genitourinary: denies burning with urination or urinary frequency Musculoskeletal: denies any other joint pains or cramps  Skin: denies any other rashes or skin discolorations  Neurological: denies any other headache, dizziness, weakness  Psychiatric: denies any other depression, anxiety   All other review of systems were negative   VITAL SIGNS:  Temp:  [97.2 F (36.2 C)-98.1 F (36.7 C)] 98.1 F (36.7 C) (01/24 1633) Pulse Rate:  [54-114] 89 (01/24 1730) Resp:  [9-23] 20 (01/24 1730) BP:  (113-157)/(62-97) 128/69 (01/24 1730) SpO2:  [96 %-100 %] 99 % (01/24 1730) FiO2 (%):  [24 %] 24 % (01/23 1933) Weight:  [87.9 kg] 87.9 kg (01/24 0857)     Height: 5\' 10"  (177.8 cm) Weight: 87.9 kg BMI (Calculated): 27.81   INTAKE/OUTPUT:  This shift: No intake/output data recorded.  Last 2 shifts: @IOLAST2SHIFTS @   PHYSICAL EXAM:  Constitutional:  -- Normal body habitus  -- Awake, alert, and oriented x3  Eyes:  -- Pupils equally round and reactive to light  -- No scleral icterus  Ear, nose, and throat:  -- No jugular venous distension  Pulmonary:  -- No crackles  -- Equal breath sounds bilaterally -- Breathing non-labored at rest  Cardiovascular:  -- S1, S2 present  -- No pericardial rubs Gastrointestinal:  -- Abdomen soft, nontender, non-distended, no guarding or rebound tenderness -- No abdominal masses appreciated, pulsatile or otherwise  Musculoskeletal and Integumentary:  -- Wounds or skin discoloration: None appreciated -- Extremities: B/L UE and LE FROM, hands and feet warm, no edema  Neurologic:  -- Motor function: intact and symmetric -- Sensation: intact and symmetric   Labs:  CBC Latest Ref Rng & Units 06/07/2018 06/07/2018 06/06/2018  WBC 4.0 - 10.5 K/uL 6.8 - 7.3  Hemoglobin 13.0 - 17.0 g/dL 11.3(L) 12.8(L) 12.5(L)  Hematocrit 39.0 - 52.0 % 33.4(L) 38.0(L) 36.2(L)  Platelets 150 - 400 K/uL 198 - 200   CMP Latest Ref Rng & Units 06/07/2018 06/06/2018 05/07/2016  Glucose 70 - 99 mg/dL 132(H) 135(H) 113(H)  BUN 8 - 23 mg/dL 13 22 20   Creatinine 0.61 - 1.24 mg/dL 0.71 0.89 0.76  Sodium 135 - 145 mmol/L 139 138 139  Potassium 3.5 - 5.1 mmol/L 3.8 4.3 3.8  Chloride 98 - 111 mmol/L 106 106 109  CO2 22 - 32 mmol/L 27 28 26   Calcium 8.9 - 10.3 mg/dL 8.5(L) 8.5(L) 8.0(L)  Total Protein 6.5 - 8.1 g/dL - 6.4(L) -  Total Bilirubin 0.3 - 1.2 mg/dL - 0.7 -  Alkaline Phos 38 - 126 U/L - 61 -  AST 15 - 41 U/L - 17 -  ALT 0 - 44 U/L - 14 -   Imaging studies:  I  personally evaluated the colonoscopy images and the report. The sigmoid red spots were identified.  Assessment/Plan:  78 y.o. male with recurrent diverticular bleeding, complicated by pertinent comorbidities including Hyperlipidemia, recurrent prostate cancer and Radiation proctitis.  Patient was oriented about the recurrent diverticular bleed. Patient already started the conversation with his PCP Dr. Caryl Comes to seek evaluation by Colorectal surgeon for evaluation. I consider that due to the patient history of prostate radiation with the complication of radiation proctitis, he should be evaluated by a Colorectal surgeon with more experience doing anastomosis to a radiated rectum. Patient oriented that at this moment there is no indication of surgical management urgently since the bleeding spontaneously stopped, his hemoglobin has not dropped below 10 recently even after the bleeding episodes and he is not symptomatic. I will relay the information to Dr. Caryl Comes to help the patient get an evaluation by a Colorectal Surgeon. From surgery standpoint, patient can be discharged when medically stable.   Arnold Long, MD

## 2018-06-07 NOTE — Op Note (Signed)
[facility] Gastroenterology Patient Name: Ryan Allison Procedure Date: 06/07/2018 9:05 AM MRN: [id number] Account #: [id number] Date of Birth: Jul 29, 1940 Admit Type: Outpatient Age: 78 Room: [facility] ENDO ROOM 2 Gender: Male Note Status: Finalized Procedure:            Colonoscopy Indications:          Hematochezia Providers:            Othman Masur B. Bonna Gains MD, MD Medicines:            Monitored Anesthesia Care Complications:        No immediate complications. Procedure:            Pre-Anesthesia Assessment:                       - Prior to the procedure, a History and Physical was                        performed, and patient medications, allergies and                        sensitivities were reviewed. The patient's tolerance of                        previous anesthesia was reviewed.                       - The risks and benefits of the procedure and the                        sedation options and risks were discussed with the                        patient. All questions were answered and informed                        consent was obtained.                       - Patient identification and proposed procedure were                        verified prior to the procedure by the physician, the                        nurse, the anesthesiologist, the anesthetist and the                        technician. The procedure was verified in the                        pre-procedure area in the procedure room in the                        endoscopy suite.                       - Prophylactic Antibiotics: The patient does not                        require prophylactic antibiotics.                       -  ASA Grade Assessment: III - A patient with severe                        systemic disease.                       - After reviewing the risks and benefits, the patient                        was deemed in satisfactory condition to undergo the   procedure.                       - Monitored anesthesia care was determined to be                        medically necessary for this procedure based on review                        of the patient's medical history, medications, and                        prior anesthesia history.                       - The anesthesia plan was to use monitored anesthesia                        care (MAC).                       After obtaining informed consent, the colonoscope was                        passed under direct vision. Throughout the procedure,                        the patient's blood pressure, pulse, and oxygen                        saturations were monitored continuously. The                        Colonoscope was introduced through the anus and                        advanced to the the terminal ileum. The colonoscopy was                        performed with ease. The patient tolerated the                        procedure well. The quality of the bowel preparation                        was fair. Findings:      The perianal and digital rectal examinations were normal.      The terminal ileum appeared normal.      There is no endoscopic evidence of bleeding in the terminal ileum.      Red blood was found in the sigmoid colon. This was represented by red  tiny spots scattered in the sigmoid colon, and may represent recent       diverticular bleeding that has resolved. No active bleeding seen.      There is no endoscopic evidence of bleeding in the entire colon.      A few small-mouthed diverticula were found in the transverse colon and       ascending colon.      Multiple small and large-mouthed diverticula were found in the sigmoid       colon.      A patchy area of mildly erythematous, likely radiation proctitis, mucosa       was found in the rectum. Coagulation for bleeding prevention using argon       plasma was successful.      A single (solitary) one mm ulcer was found in  the rectum. No bleeding       was present. No stigmata of recent bleeding were seen.      The exam was otherwise without abnormality.      Non-bleeding internal hemorrhoids were found during retroflexion. Impression:           - The tiny red spots in the sigmoid colon mentioned                        above indicate possible diverticular source of bleeding                        from the sigmoid colon that has resolved at this time.                        However, his internal hemorrhoids and mild radiation                        proctitis could also have contributed to the bleeding.                       - Preparation of the colon was fair.                       - The examined portion of the ileum was normal.                       - Old tiny spots of marroon Blood in the sigmoid colon.                       - Diverticulosis in the transverse colon and in the                        ascending colon.                       - Diverticulosis in the sigmoid colon.                       - Erythematous, likely radiation proctitis, mucosa in                        the rectum. Treated with argon plasma coagulation (APC).                       - A single (solitary) ulcer in the rectum.                       -  The examination was otherwise normal.                       - Non-bleeding internal hemorrhoids.                       - No specimens collected.                       - No blood was seen in the terminal ileum, pt denied                        any melena or hematochezia or hematemesis since the                        prep. And thus no signs of Upper GI bleed and no                        indication for EGD or pill camera at this time. Recommendation:       - Refer to a surgeon to discuss sigmoid colectomy due                        to recurrent diverticular bleeding.                       - Continue Serial CBCs and transfuse PRN                       - Return to GI clinic in 2 weeks.                        - Please page GI with any signs of active GI bleeding.                       Ok to discontinue Protonix                       - The findings and recommendations were discussed with                        the patient.                       - The findings and recommendations were discussed with                        the patient's family.                       - Sucralfate enema for radiation proctitis to be                        discussed in GI clinic Procedure Code(s):    --- Professional ---                       [phone], Colonoscopy, flexible; with control of bleeding,                        any method Diagnosis Code(s):    --- Professional ---  K64.8, Other hemorrhoids                       K92.2, Gastrointestinal hemorrhage, unspecified                       K62.6, Ulcer of anus and rectum                       K92.1, Melena (includes Hematochezia)                       K57.30, Diverticulosis of large intestine without                        perforation or abscess without bleeding CPT copyright 2018 American Medical Association. All rights reserved. The codes documented in this report are preliminary and upon coder review may  be revised to meet current compliance requirements.  Vonda Antigua, MD Margretta Sidle B. Bonna Gains MD, MD 06/07/2018 2:09:36 PM This report has been signed electronically. Number of Addenda: 0 Note Initiated On: 06/07/2018 9:05 AM Estimated Blood Loss: Estimated blood loss: none.      [facility]

## 2018-06-07 NOTE — Anesthesia Postprocedure Evaluation (Signed)
Anesthesia Post Note  Patient: Ryan Allison  Procedure(s) Performed: COLONOSCOPY WITH PROPOFOL (N/A )  Patient location during evaluation: Endoscopy Anesthesia Type: General Level of consciousness: awake and alert and oriented Pain management: pain level controlled Vital Signs Assessment: post-procedure vital signs reviewed and stable Respiratory status: spontaneous breathing, nonlabored ventilation and respiratory function stable Cardiovascular status: blood pressure returned to baseline and stable Postop Assessment: no signs of nausea or vomiting Anesthetic complications: no     Last Vitals:  Vitals:   06/07/18 1355 06/07/18 1405  BP: 140/79 (!) 113/97  Pulse: 88 87  Resp: 16 15  Temp:    SpO2: 100% 100%    Last Pain:  Vitals:   06/07/18 1405  TempSrc:   PainSc: 0-No pain                 Jos Cygan

## 2018-06-07 NOTE — Progress Notes (Signed)
Pt had episode of somnolent's while sitting up in bed eating, called to bedside by nursing staff.  Upon my arrival at bedside pt alert and oriented, follows commands, PERRLA, neurologically intact no neuro deficits noted.  Pt states he has not slept over the past few days.  Will continue to monitor pt if symptoms reoccur will order CT Head. Pts significant other at bedside and updated.  Marda Stalker, [place] Pager [phone] (please enter 7 digits) PCCM Consult Pager [phone] (please enter 7 digits)

## 2018-06-07 NOTE — Progress Notes (Signed)
Progress Note  Patient Name: Ryan Allison Date of Encounter: 06/07/2018  Primary Cardiologist: new to [facility]  Subjective   Tired this morning, reports no further significant bleed Did cleanout yesterday, did not get much sleep Ready for colonoscopy today Tolerated diltiazem infusion now down to 5 mg/h, rate 80 to 90s  Prelim echocardiogram shows normal LV function greater than 55%  Inpatient Medications    Scheduled Meds: . [MAR Hold] pantoprazole (PROTONIX) IV  40 mg Intravenous Q12H   Continuous Infusions: . sodium chloride 20 mL/hr at 06/07/18 0909  . [MAR Hold] diltiazem (CARDIZEM) infusion 5 mg/hr (06/06/18 1905)  . lactated ringers 50 mL/hr at 06/07/18 0736   PRN Meds: [MAR Hold] acetaminophen **OR** [MAR Hold] acetaminophen, [MAR Hold] ondansetron **OR** [MAR Hold] ondansetron (ZOFRAN) IV   Vital Signs    Vitals:   06/07/18 0500 06/07/18 0600 06/07/18 0744 06/07/18 0857  BP: (!) 142/84 (!) 149/74  (!) 142/73  Pulse: 82 95  88  Resp: 14 16  16   Temp:   97.9 F (36.6 C)   TempSrc:   Oral Tympanic  SpO2: 100% 98%  100%  Weight: 87.9 kg   87.9 kg  Height:    5\' 10"  (1.778 m)    Intake/Output Summary (Last 24 hours) at 06/07/2018 1218 Last data filed at 06/07/2018 0915 Gross per 24 hour  Intake 1018.84 ml  Output 1625 ml  Net -606.16 ml   Last 3 Weights 06/07/2018 06/07/2018 06/06/2018  Weight (lbs) 193 lb 12.6 oz 193 lb 12.6 oz 199 lb 1.2 oz  Weight (kg) 87.9 kg 87.9 kg 90.3 kg      Telemetry    Atrial flutter rate 80-98- Personally Reviewed  ECG     - Personally Reviewed  Physical Exam   GEN: No acute distress.  Appears pale Neck: No JVD Cardiac: RRR, no murmurs, rubs, or gallops.  Respiratory: Clear to auscultation bilaterally. GI: Soft, nontender, non-distended  MS: No edema; No deformity. Neuro:  Nonfocal  Psych: Normal affect   Labs    Chemistry Recent Labs  Lab 06/06/18 0607 06/07/18 0536  NA 138 139  K 4.3 3.8  CL 106 106    CO2 28 27  GLUCOSE 135* 132*  BUN 22 13  CREATININE 0.89 0.71  CALCIUM 8.5* 8.5*  PROT 6.4*  --   ALBUMIN 3.6  --   AST 17  --   ALT 14  --   ALKPHOS 61  --   BILITOT 0.7  --   GFRNONAA >60 >60  GFRAA >60 >60  ANIONGAP 4* 6     Hematology Recent Labs  Lab 06/06/18 0607  06/06/18 1956 06/07/18 0012 06/07/18 0536  WBC 5.0  --  7.3  --  6.8  RBC 3.55*  --  3.84*  --  3.51*  HGB 11.8*   < > 12.5* 12.8* 11.3*  HCT 35.1*   < > 36.2* 38.0* 33.4*  MCV 98.9  --  94.3  --  95.2  MCH 33.2  --  32.6  --  32.2  MCHC 33.6  --  34.5  --  33.8  RDW 12.5  --  13.3  --  13.5  PLT 209  --  200  --  198   < > = values in this interval not displayed.    Cardiac Enzymes Recent Labs  Lab 06/06/18 0607 06/06/18 1508 06/06/18 1956 06/07/18 0306  TROPONINI <0.03 <0.03 <0.03 <0.03   No results for input(s): TROPIPOC  in the last 168 hours.   BNPNo results for input(s): BNP, PROBNP in the last 168 hours.   DDimer No results for input(s): DDIMER in the last 168 hours.   Radiology    No results found.  Cardiac Studies   Prelim echocardiogram reviewed personally by myself showing normal ejection fraction greater than 55%, normal RV function, mild MR, no other significant valve disease heart rate 80-98 atrial flutter  Patient Profile     [name] is a 78 year old gentleman with history of aortic atherosclerosis, hyperlipidemia, obstructive sleep apnea on CPAP, former smoker, GERD, frequent episodes of lower GI bleeding, prostate cancer, presenting with severe lower GI bleed and new onset atrial flutter  Assessment & Plan    A/P: Atrial flutter with RVR Rate relatively well controlled on diltiazem infusion As he starts taking p.o. meds, we can change the diltiazem to oral extended release dosing -Suspect he will tolerate Cardizem 180 mg extended release daily Not a good candidate for anticoagulation at this time given severe GI bleed, history of GI bleeds -Mildly symptomatic  over the past several weeks in the setting of flutter  History of lower GI bleed Known diverticuli Prior severe bleeding requiring embolization in the past GI involved, colonoscopy today Given life-threatening bleeds on multiple occasions, there has been discussion of partial colectomy -We will wait on definitive GI result/clearance before starting anticoagulation  Prostate cancer Followed by oncology at [facility] Recently restarted on medications several weeks ago Denies having significant side effects He will continue to follow-up with [facility]   Total encounter time more than 25 minutes  Greater than 50% was spent in counseling and coordination of care with the patient   For questions or updates, please contact [facility] Please consult [email or web address] for contact info under        Signed, Ida Rogue, MD  06/07/2018, 12:18 PM

## 2018-06-07 NOTE — Anesthesia Preprocedure Evaluation (Addendum)
Anesthesia Evaluation  Patient identified by MRN, date of birth, ID band Patient awake    Reviewed: Allergy & Precautions, NPO status , Patient's Chart, lab work & pertinent test results  History of Anesthesia Complications Negative for: history of anesthetic complications  Airway Mallampati: III  TM Distance: >3 FB Neck ROM: Full    Dental no notable dental hx.    Pulmonary neg sleep apnea, neg COPD, former smoker,    breath sounds clear to auscultation- rhonchi (-) wheezing      Cardiovascular (-) hypertension(-) CAD, (-) Past MI, (-) Cardiac Stents and (-) CABG + dysrhythmias Atrial Fibrillation  Rhythm:Regular Rate:Normal - Systolic murmurs and - Diastolic murmurs    Neuro/Psych neg Seizures negative neurological ROS  negative psych ROS   GI/Hepatic Neg liver ROS, GERD  ,GIB    Endo/Other  negative endocrine ROSneg diabetes  Renal/GU negative Renal ROS     Musculoskeletal  (+) Arthritis ,   Abdominal (+) - obese,   Peds  Hematology negative hematology ROS (+)   Anesthesia Other Findings Past Medical History: No date: Arthritis 2011: Cancer ([place])     Comment:  prostate No date: History of GI diverticular bleed     Comment:  extended stay in the [facility] ICU due to GI bleeding prior               to 2016 No date: Hyperlipidemia No date: Intestinal bleeding No date: Meniscus tear No date: Prostate enlargement   Reproductive/Obstetrics                            Anesthesia Physical Anesthesia Plan  ASA: III  Anesthesia Plan: General   Post-op Pain Management:    Induction: Intravenous  PONV Risk Score and Plan: 1 and Propofol infusion  Airway Management Planned: Natural Airway  Additional Equipment:   Intra-op Plan:   Post-operative Plan:   Informed Consent: I have reviewed the patients History and Physical, chart, labs and discussed the procedure including the risks,  benefits and alternatives for the proposed anesthesia with the patient or authorized representative who has indicated his/her understanding and acceptance.     Dental advisory given  Plan Discussed with: CRNA and Anesthesiologist  Anesthesia Plan Comments:       Anesthesia Quick Evaluation

## 2018-06-08 DIAGNOSIS — I4892 Unspecified atrial flutter: Secondary | ICD-10-CM

## 2018-06-08 LAB — BPAM RBC
Blood Product Expiration Date: 202001302359
Blood Product Expiration Date: 202002132359
Blood Product Expiration Date: 202002132359
Blood Product Expiration Date: 202002132359
Blood Product Expiration Date: 202002142359
Blood Product Expiration Date: 202002162359
ISSUE DATE / TIME: 202001230609
ISSUE DATE / TIME: 202001230609
UNIT TYPE AND RH: 9500
Unit Type and Rh: 5100
Unit Type and Rh: 5100
Unit Type and Rh: 5100
Unit Type and Rh: 5100
Unit Type and Rh: 9500

## 2018-06-08 LAB — TYPE AND SCREEN
ABO/RH(D): O NEG
Antibody Screen: NEGATIVE
Unit division: 0
Unit division: 0
Unit division: 0
Unit division: 0
Unit division: 0
Unit division: 0

## 2018-06-08 LAB — CBC
HCT: 34.2 % — ABNORMAL LOW (ref 39.0–52.0)
Hemoglobin: 11.6 g/dL — ABNORMAL LOW (ref 13.0–17.0)
MCH: 32.8 pg (ref 26.0–34.0)
MCHC: 33.9 g/dL (ref 30.0–36.0)
MCV: 96.6 fL (ref 80.0–100.0)
Platelets: 192 10*3/uL (ref 150–400)
RBC: 3.54 MIL/uL — ABNORMAL LOW (ref 4.22–5.81)
RDW: 12.9 % (ref 11.5–15.5)
WBC: 6.8 10*3/uL (ref 4.0–10.5)
nRBC: 0 % (ref 0.0–0.2)

## 2018-06-08 LAB — PREPARE RBC (CROSSMATCH)

## 2018-06-08 MED ORDER — DILTIAZEM HCL ER COATED BEADS 180 MG PO CP24: 180.0000 mg | ORAL_CAPSULE | Freq: Every day | ORAL | 0 refills | Status: DC

## 2018-06-08 NOTE — Discharge Instructions (Signed)
It was so nice to meet you during this hospitalization!  You had another episode of bleeding. The GI doctor is recommending that you see a colorectal surgeon to discuss removing the section of colon that keeps bleeding. Your primary care doctor can refer you to a surgeon at [facility] if you prefer.  The heart doctor started you on a medication called Diltiazem. Please take 1 tablet daily. You should follow-up with Dr. Rockey Situ in his office in the next week.  Take care, Dr. Brett Albino

## 2018-06-08 NOTE — Discharge Summary (Signed)
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 Arthritis   . Cancer [facility]) 2011   prostate  . History of GI diverticular bleed    extended stay in the [facility] ICU due to GI bleeding prior to 2016  . Hyperlipidemia   . Intestinal bleeding   . Meniscus tear   . Prostate enlargement    HOSPITAL COURSE:   Ryan Allison is a 78 year old male who presented to the ED with bright red blood per rectum.  He was given a blood transfusion immediately on arrival to the ED due to volume of blood loss.  He was admitted to the ICU for further management.  GI bleed- likely diverticular bleed.  -Seen by GI this admission -Had a colonoscopy on 06/07/2018, which showed tiny red spots in the sigmoid colon that may indicate possible diverticular bleed that has resolved.  Also with diverticulosis in the transverse colon, ascending colon and sigmoid colon.  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 -Seen by surgery for evaluation of possible colectomy due to recurrent diverticular bleed.  Surgery recommended that patient be evaluated by a colorectal surgeon. -Hemoglobin stable throughout admission -Patient had a bowel movement without any bleeding on the day of discharge -Needs referral to [facility] colorectal  surgery as an outpatient  New onset atrial flutter with rvr- occurred while patient was in the ICU. -Cardiology consulted -Patient initially placed on diltiazem drip and then transitioned to p.o. diltiazem -Not a candidate for anticoagulation at this time due to recurrent GI bleeding -Needs to follow-up with Dr. Rockey Situ as an outpatient  GERD -Treated with PPI  Hyperlipidemia-stable -Continued statin  BPH/prostate cancer- s/p radiation in 2011.  -Continued Flomax  DISCHARGE CONDITIONS:  Recurrent diverticular bleed Radiation proctitis New onset atrial flutter GERD Hyperlipidemia BPH Prostate cancer CONSULTS OBTAINED:  Treatment Team:  Minna Merritts, MD  Cardiology GI DRUG ALLERGIES:   Allergies  Allergen Reactions  . Nsaids Other (See Comments)    Diverticular bleed Diverticular bleed  . Tolmetin     Other reaction(s): Other (See Comments) Diverticular bleed Diverticular bleed  . Venlafaxine Other (See Comments)    sedation  . Celebrex [Celecoxib] Itching and Rash   DISCHARGE MEDICATIONS:   Allergies as of 06/08/2018      Reactions   Nsaids Other (See Comments)   Diverticular bleed Diverticular bleed   Tolmetin    Other reaction(s): Other (See Comments) Diverticular bleed Diverticular bleed   Venlafaxine Other (See Comments)   sedation   Celebrex [celecoxib] Itching, Rash      Medication List    STOP taking these medications   NEOMYCIN-POLYMYXIN-HYDROCORTISONE 1 % Soln OTIC solution Commonly known as:  CORTISPORIN     TAKE these medications   Acetaminophen 500 MG coapsule Take 500 mg by mouth daily as needed.  bicalutamide 50 MG tablet Commonly known as:  CASODEX Take 50 mg by mouth daily.   COLACE 100 MG capsule Generic drug:  docusate sodium Take 100 mg by mouth daily as needed.   cyanocobalamin 1000 MCG tablet Take 1,000 mcg by mouth daily.   diltiazem 180 MG 24 hr capsule Commonly known as:  CARDIZEM CD Take 1 capsule  (180 mg total) by mouth daily. Start taking on:  June 09, 2018   gabapentin 600 MG tablet Commonly known as:  NEURONTIN Take 600 mg by mouth 4 (four) times daily.   MIRALAX packet Generic drug:  polyethylene glycol Take 17 g by mouth daily as needed.   omeprazole 40 MG capsule Commonly known as:  PRILOSEC Take 40 mg by mouth daily.   simvastatin 20 MG tablet Commonly known as:  ZOCOR Take 20 mg by mouth daily.   tamsulosin 0.4 MG Caps capsule Commonly known as:  FLOMAX Take 0.4 mg by mouth daily.   traMADol 50 MG tablet Commonly known as:  ULTRAM Take 50 mg by mouth every 6 (six) hours as needed for moderate pain.        DISCHARGE INSTRUCTIONS:  1.  Follow-up with PCP in 5 days 2.  Follow-up with cardiology in 1 week 3.  Follow-up with GI in 2 weeks 4.  Take diltiazem daily as prescribed 5.  Needs a referral to [facility] colorectal surgery DIET:  Cardiac diet DISCHARGE CONDITION:  Stable ACTIVITY:  Activity as tolerated OXYGEN:  Home Oxygen: No.  Oxygen Delivery: room air DISCHARGE LOCATION:  home   If you experience worsening of your admission symptoms, develop shortness of breath, life threatening emergency, suicidal or homicidal thoughts you must seek medical attention immediately by calling 911 or calling your MD immediately  if symptoms less severe.  You Must read complete instructions/literature along with all the possible adverse reactions/side effects for all the Medicines you take and that have been prescribed to you. Take any new Medicines after you have completely understood and accpet all the possible adverse reactions/side effects.   Please note  You were cared for by a hospitalist during your hospital stay. If you have any questions about your discharge medications or the care you received while you were in the hospital after you are discharged, you can call the unit and asked to speak with the hospitalist on call if the hospitalist that took care of  you is not available. Once you are discharged, your primary care physician will handle any further medical issues. Please note that NO REFILLS for any discharge medications will be authorized once you are discharged, as it is imperative that you return to your primary care physician (or establish a relationship with a primary care physician if you do not have one) for your aftercare needs so that they can reassess your need for medications and monitor your lab values.    On the day of Discharge:  VITAL SIGNS:  Blood pressure 115/73, pulse 65, temperature 98.2 F (36.8 C), temperature source Oral, resp. rate 18, height 5\' 10"  (1.778 m), weight 83.6 kg, SpO2 100 %. PHYSICAL EXAMINATION:  GENERAL:  78 y.o.-year-old patient lying in the bed with no acute distress.  EYES: Pupils equal, round, reactive to light and accommodation. No scleral icterus. Extraocular muscles intact.  HEENT: Head atraumatic, normocephalic. Oropharynx and nasopharynx clear.  NECK:  Supple, no jugular venous distention. No thyroid enlargement, no tenderness.  LUNGS: Normal breath sounds bilaterally, no wheezing, rales,rhonchi or crepitation. No use of  accessory muscles of respiration.  CARDIOVASCULAR: Irregularly irregular rhythm, regular rate, S1, S2 normal. No murmurs, rubs, or gallops.  ABDOMEN: Soft, non-tender, non-distended. Bowel sounds present. No organomegaly or mass.  EXTREMITIES: No pedal edema, cyanosis, or clubbing.  NEUROLOGIC: Cranial nerves II through XII are intact. Muscle strength 5/5 in all extremities. Sensation intact. Gait not checked.  PSYCHIATRIC: The patient is alert and oriented x 3.  SKIN: No obvious rash, lesion, or ulcer.  DATA REVIEW:   CBC Recent Labs  Lab 06/08/18 0950  WBC 6.8  HGB 11.6*  HCT 34.2*  PLT 192    Chemistries  Recent Labs  Lab 06/06/18 0607 06/07/18 0536  NA 138 139  K 4.3 3.8  CL 106 106  CO2 28 27  GLUCOSE 135* 132*  BUN 22 13  CREATININE 0.89 0.71  CALCIUM  8.5* 8.5*  AST 17  --   ALT 14  --   ALKPHOS 61  --   BILITOT 0.7  --      Microbiology Results  Results for orders placed or performed during the hospital encounter of 06/06/18  MRSA PCR Screening     Status: None   Collection Time: 06/06/18  8:31 AM  Result Value Ref Range Status   MRSA by PCR NEGATIVE NEGATIVE Final    Comment:        The GeneXpert MRSA Assay (FDA approved for NASAL specimens only), is one component of a comprehensive MRSA colonization surveillance program. It is not intended to diagnose MRSA infection nor to guide or monitor treatment for MRSA infections. Performed at [facility], [address] [place], [place] [address]     RADIOLOGY:  No results found.   Management plans discussed with the patient, family and they are in agreement.  CODE STATUS: Full Code   TOTAL TIME TAKING CARE OF THIS PATIENT: 40 minutes.    Berna Spare  M.D on 06/08/2018 at 11:41 AM  Between 7am to 6pm - Pager - [phone]  After 6pm go to [email or web address] - password [email or web address] [facility]  [facility]  Office  [phone]  CC: Primary care physician; Adin Hector, MD   Note: This dictation was prepared with Dragon dictation along with smaller phrase technology. Any transcriptional errors that result from this process are unintentional.

## 2018-06-08 NOTE — Progress Notes (Signed)
Progress Note  Patient Name: Ryan Allison Date of Encounter: 06/08/2018  Primary Cardiologist: new to [place] [facility], pending discharge today. Discussed management of atrial flutter, all questions answered today.  Inpatient Medications    Scheduled Meds: . atorvastatin  20 mg Oral q1800  . bicalutamide  50 mg Oral Daily  . diltiazem  180 mg Oral Daily  . gabapentin  300 mg Oral BID  . pantoprazole (PROTONIX) IV  40 mg Intravenous Q12H  . tamsulosin  0.4 mg Oral Daily  . vitamin B-12  1,000 mcg Oral Daily   Continuous Infusions:  PRN Meds: acetaminophen **OR** acetaminophen, docusate sodium, ondansetron **OR** ondansetron (ZOFRAN) IV, polyethylene glycol, traZODone   Vital Signs    Vitals:   06/07/18 1730 06/07/18 1939 06/08/18 0432 06/08/18 0737  BP: 128/69 133/61 (!) 104/57 115/73  Pulse: 89 92 65 65  Resp: 20   18  Temp:  98.7 F (37.1 C) 98.6 F (37 C) 98.2 F (36.8 C)  TempSrc:  Oral Oral Oral  SpO2: 99% 99% 99% 100%  Weight:   83.6 kg   Height:        Intake/Output Summary (Last 24 hours) at 06/08/2018 1157 Last data filed at 06/08/2018 0900 Gross per 24 hour  Intake 802.55 ml  Output 1400 ml  Net -597.45 ml   Last 3 Weights 06/08/2018 06/07/2018 06/07/2018  Weight (lbs) 184 lb 6.4 oz 193 lb 12.6 oz 193 lb 12.6 oz  Weight (kg) 83.643 kg 87.9 kg 87.9 kg      Telemetry    Atrial flutter, rate controlled- Personally Reviewed  ECG     atrial flutter with variable block, HR 85- Personally Reviewed  Physical Exam   GEN: No acute distress.  Appears pale Neck: No JVD Cardiac: largely regular rhythm with occasional early beats, no murmurs, rubs, or gallops.  Respiratory: Clear to auscultation bilaterally. GI: Soft, nontender, non-distended  MS: No edema; No deformity. Neuro:  Nonfocal  Psych: Normal affect   Labs    Chemistry Recent Labs  Lab 06/06/18 0607 06/07/18 0536  NA 138 139  K 4.3 3.8  CL 106 106  CO2 28 27    GLUCOSE 135* 132*  BUN 22 13  CREATININE 0.89 0.71  CALCIUM 8.5* 8.5*  PROT 6.4*  --   ALBUMIN 3.6  --   AST 17  --   ALT 14  --   ALKPHOS 61  --   BILITOT 0.7  --   GFRNONAA >60 >60  GFRAA >60 >60  ANIONGAP 4* 6     Hematology Recent Labs  Lab 06/06/18 1956 06/07/18 0012 06/07/18 0536 06/08/18 0950  WBC 7.3  --  6.8 6.8  RBC 3.84*  --  3.51* 3.54*  HGB 12.5* 12.8* 11.3* 11.6*  HCT 36.2* 38.0* 33.4* 34.2*  MCV 94.3  --  95.2 96.6  MCH 32.6  --  32.2 32.8  MCHC 34.5  --  33.8 33.9  RDW 13.3  --  13.5 12.9  PLT 200  --  198 192    Cardiac Enzymes Recent Labs  Lab 06/06/18 0607 06/06/18 1508 06/06/18 1956 06/07/18 0306  TROPONINI <0.03 <0.03 <0.03 <0.03   No results for input(s): TROPIPOC in the last 168 hours.   BNPNo results for input(s): BNP, PROBNP in the last 168 hours.   DDimer No results for input(s): DDIMER in the last 168 hours.   Radiology    No results found.  Cardiac  Studies   Echo 06/07/2018 Study Conclusions - Left ventricle: The cavity size was normal. Systolic function was   normal. The estimated ejection fraction was in the range of 60%   to 65%. Wall motion was normal; there were no regional wall   motion abnormalities. - Mitral valve: There was mild regurgitation. - Left atrium: The atrium was normal in size. - Right ventricle: Systolic function was normal. - Pulmonary arteries: Systolic pressure was within the normal   range. PA peak pressure: 33 mm Hg (S).  Impressions: - Rhythm is atrial flutter.  Patient Profile     Ryan Allison is a 78 year old gentleman with history of aortic atherosclerosis, hyperlipidemia, obstructive sleep apnea on CPAP, former smoker, GERD, frequent episodes of lower GI bleeding, prostate cancer, presenting with severe lower GI bleed and new onset atrial flutter  Assessment & Plan    A/P: Atrial flutter with RVR on presentation, now rate controlled -on diltiazem PO with good control -no  anticoagulation due to recurrent severe symptomatic GI bleeds -discussed etiology of flutter, reason for (eventual) anticoagulation for stroke prevention, options for management long term once he can tolerate anticoagulation. CHA2DS2/VAS=3 (age, atherosclerosis) -discussed watching for RVR, when to call/be seen emergently -will request follow up with Dr. Rockey Situ after discharge  History of lower GI bleed: seen by surgery this hospitalization, seen by GI with colonoscopy yesterday Known diverticuli, prior severe bleeding requiring embolization in the past Plan is to be seen by colorectal surgery as an outpatient to discuss partial colectomy. Given this, will hold on starting anticoagulation, would need to be stable from GI perspective prior to initiation of anticoagulation  Prostate cancer Followed by oncology at [facility]   Total encounter time more than 25 minutes  Greater than 50% was spent in counseling and coordination of care with the patient  [facility] will sign off in anticipation of discharge.   Medication Recommendations:  Diltiazem as ordered Other recommendations (labs, testing, etc):  none Follow up as an outpatient:  I will request follow up with Dr. Rockey Situ  For questions or updates, please contact [facility] Please consult [email or web address] for contact info under     Signed, Buford Dresser, MD  06/08/2018, 11:57 AM

## 2018-06-10 ENCOUNTER — Encounter: Payer: Self-pay | Admitting: Gastroenterology

## 2018-06-12 ENCOUNTER — Telehealth: Payer: Self-pay | Admitting: *Deleted

## 2018-06-12 NOTE — Telephone Encounter (Signed)
Left a message for the patient to call back.  

## 2018-06-12 NOTE — Telephone Encounter (Signed)
-----   Message from Ace Gins sent at 06/12/2018 11:31 AM EST ----- Regarding: FW: post discharge scheduling Patient scheduled 07/01/18 at 11:30a with R. Dunn  ----- Message ----- From: Buford Dresser, MD Sent: 06/08/2018  12:06 PM EST To: [facility] Scheduling Subject: post discharge scheduling                      This patient will need a TOC appointment within ~3 weeks and follow up long term with Dr. Rockey Situ for atrial flutter. Thank you! Bridgette Harrell Gave

## 2018-06-12 NOTE — Telephone Encounter (Signed)
Patient contacted regarding discharge from [facility] on 06/08/2018.  Patient understands to follow up with provider Christell Faith, PA on 07/01/2018 at 11:30 at [facility]. Patient understands discharge instructions? Yes Patient understands medications and regiment? Yes Patient understands to bring all medications to this visit? Yes  Patient saw PCP yesterday for his follow up.  The patient stated that he is feeling good but tired. He will call back with any further concerns.

## 2018-06-19 DIAGNOSIS — I480 Paroxysmal atrial fibrillation: Secondary | ICD-10-CM | POA: Insufficient documentation

## 2018-06-24 ENCOUNTER — Ambulatory Visit: Payer: Medicare Other | Admitting: Gastroenterology

## 2018-06-28 NOTE — Progress Notes (Signed)
Cardiology Office Note Date:  07/01/2018  Patient ID:  Ryan Allison 02/05/1941, MRN [id number] PCP:  Adin Hector, MD  Cardiologist:  Dr. Rockey Situ, MD    Chief Complaint: Hospital follow up  History of Present Illness: Ryan Allison is a 78 y.o. male with history of recently diagnosed atrial flutter of uncertain chronicity in 05/2018 not on full dose anticoagulation secondary to prior life threatening GI bleeds, recurrent prostate cancer with PET scan from 04/2018 showing no metastatic disease, aortic atherosclerosis, HLD, OSA on CPAP, diverticulosis, peripheral neuropathy, prior tobacco abuse, and GERD who presents for hospital follow up after recent admission to [facility] from 1/23 to 1/25 for GI bleed and newly diagnosed atrial flutter.   Prior to the above admission, the patient did not have any previously known cardiac history. He was admitted to [facility] on 1/23 following 2 episodes of BRBPR with associated presyncope, SOB, and palpitations. He was noted to be in atrial flutter with RVR. He required immediate transfusion of pRBC secondary to blood loss. He was rate controlled. He was not able to be placed on anticoagulation given recurrent GI bleeding. Cardiac enzymes remained negative. He underwent colonoscopy on 06/07/2018 that showed possible evidence of diverticular bleed in the sigmoid colon, diverticulosis of the transverse, ascending, and sigmoid colon as well as radiation proctitis. It was recommended he follow up with a colorectal surgeon for possible colectomy due to recurrent diverticular bleed. Echo during his admission on 06/07/2018 showed an EF of 60-65%, normal wall motion, mild MR, left atrium normal in size, RVSF normal, PASP 33 mmHg, rhythm was atrial flutter.  Discharge labs: WBC 6.8, HGB 11.6, PLT 192, K+ 3.8, SCr 0.71, A1c 5.5, AST/ALT normal, TSH normal  In follow up with his PCP on 06/11/2018, EKG showed he remained in atrial flutter with 4:1 AV block per note. He was  seen by his PCP again on 06/17/2018 with a rash that started underneath his axilla, though spread diffusely. He had already self discontinued Cardizem with some improvement in rash. He was placed on metoprolol for rate control of his atrial flutter.  He was most recently seen by his PCP on 06/20/2018 and was doing well. He was tolerating metoprolol without issues. He has an appointment with surgery at [facility] on 07/04/2018.   He comes in accompanied by his wife today and is doing well from a cardiac perspective.  He reports a longstanding history of intermittent "heart pounding" sensations with associated fatigue and shortness of breath, particularly when he has been exerting himself over the years.  He now wonders if this was atrial flutter all along.  Since transitioning to metoprolol from diltiazem secondary to presumed drug reaction, he continues to do well with heart rates ranging from the mid 70s to mid 80s bpm and blood pressure readings from the upper 462V to 035K systolic.  No chest pain, dizziness, presyncope, or syncope.  Rash has resolved.  No lower extremity swelling, abdominal distention, orthopnea, PND, early satiety.  No further GI bleeding.  No falls.   Past Medical History:  Diagnosis Date  . Aortic atherosclerosis ([place])   . Arthritis   . Atrial flutter ([place])    a. diagnosed 05/2018; b. not on [place] 2/2 recurrent life threatening GI bleed; c. CHADS2VASc => 3 (age x 2, vascular disease)  . Cancer [facility]) 2011   prostate  . GI bleed   . History of echocardiogram    a. TTE 05/2018: EF of 60-65%, normal wall motion,  mild MR, left atrium normal in size, RVSF normal, PASP 33 mmHg, rhythm was atrial flutter  . History of GI diverticular bleed    extended stay in the [facility] ICU due to GI bleeding prior to 2016  . HLD (hyperlipidemia)   . Hyperlipidemia   . Meniscus tear   . OSA on CPAP   . Peripheral neuropathy     Past Surgical History:  Procedure Laterality Date  . COLONOSCOPY Left 06/07/2018    Procedure: COLONOSCOPY;  Surgeon: Virgel Manifold, MD;  Location: [facility] ENDOSCOPY;  Service: Endoscopy;  Laterality: Left;  . COLONOSCOPY WITH PROPOFOL N/A 04/06/2015   Procedure: COLONOSCOPY WITH PROPOFOL;  Surgeon: Lollie Sails, MD;  Location: [facility] ENDOSCOPY;  Service: Endoscopy;  Laterality: N/A;  . COLONOSCOPY WITH PROPOFOL N/A 06/07/2018   Procedure: COLONOSCOPY WITH PROPOFOL;  Surgeon: Virgel Manifold, MD;  Location: [facility] ENDOSCOPY;  Service: Endoscopy;  Laterality: N/A;  . HERNIA REPAIR Left 09-23-14   inguinal  . INGUINAL HERNIA REPAIR Left 09/23/2014   Procedure: HERNIA REPAIR INGUINAL ADULT;  Surgeon: Christene Lye, MD;  Location: [facility] ORS;  Service: General;  Laterality: Left;  . MENISCUS REPAIR  2008  . VASCULAR SURGERY  2015    Current Meds  Medication Sig  . Acetaminophen 500 MG coapsule Take 500 mg by mouth daily as needed.  . bicalutamide (CASODEX) 50 MG tablet Take 50 mg by mouth daily.  . cyanocobalamin 1000 MCG tablet Take 1,000 mcg by mouth daily.  Marland Kitchen gabapentin (NEURONTIN) 600 MG tablet Take 600 mg by mouth 4 (four) times daily.  . metoprolol tartrate (LOPRESSOR) 25 MG tablet Take 25 mg by mouth 2 (two) times daily.   Marland Kitchen omeprazole (PRILOSEC) 40 MG capsule Take 40 mg by mouth daily.  . polyethylene glycol (MIRALAX) packet Take 17 g by mouth daily as needed.  . simvastatin (ZOCOR) 20 MG tablet Take 20 mg by mouth daily.  . tamsulosin (FLOMAX) 0.4 MG CAPS capsule Take 0.4 mg by mouth daily.   . traMADol (ULTRAM) 50 MG tablet Take 50 mg by mouth every 6 (six) hours as needed for moderate pain.     Allergies:   Nsaids; Tolmetin; Celebrex [celecoxib]; Diltiazem hcl; Other; and Venlafaxine   Social History:  The patient  reports that he has quit smoking. He has never used smokeless tobacco. He reports that he does not drink alcohol or use drugs.   Family History:  The patient's family history includes Cancer in his brother and father.  ROS:   Review  of Systems  Constitutional: Positive for malaise/fatigue. Negative for chills, diaphoresis, fever and weight loss.  HENT: Negative for congestion.   Eyes: Negative for discharge and redness.  Respiratory: Positive for shortness of breath. Negative for cough, hemoptysis, sputum production and wheezing.   Cardiovascular: Positive for palpitations. Negative for chest pain, orthopnea, claudication, leg swelling and PND.       Palpitations much improved since starting metoprolol  Gastrointestinal: Positive for blood in stool. Negative for abdominal pain, constipation, diarrhea, heartburn, melena, nausea and vomiting.  Genitourinary: Negative for hematuria.  Musculoskeletal: Negative for falls and myalgias.  Skin: Negative for rash.  Neurological: Positive for weakness. Negative for dizziness, tingling, tremors, sensory change, speech change, focal weakness and loss of consciousness.  Endo/Heme/Allergies: Does not bruise/bleed easily.  Psychiatric/Behavioral: Negative for substance abuse. The patient is not nervous/anxious.   All other systems reviewed and are negative.    PHYSICAL EXAM:  VS:  BP 138/76 (BP Location: Left  Arm, Patient Position: Sitting, Cuff Size: Normal)   Pulse 87   Ht 5\' 11"  (1.803 m)   Wt 199 lb 8 oz (90.5 kg)   BMI 27.82 kg/m  BMI: Body mass index is 27.82 kg/m.  Physical Exam  Constitutional: He is oriented to person, place, and time. He appears well-developed and well-nourished.  HENT:  Head: Normocephalic and atraumatic.  Eyes: Right eye exhibits no discharge. Left eye exhibits no discharge.  Neck: Normal range of motion. No JVD present.  Cardiovascular: Normal rate, S1 normal, S2 normal and normal heart sounds. An irregular rhythm present. Exam reveals no distant heart sounds, no friction rub, no midsystolic click and no opening snap.  No murmur heard. Pulses:      Posterior tibial pulses are 2+ on the right side and 2+ on the left side.  Pulmonary/Chest:  Effort normal and breath sounds normal. No respiratory distress. He has no decreased breath sounds. He has no wheezes. He has no rales. He exhibits no tenderness.  Abdominal: Soft. He exhibits no distension. There is no abdominal tenderness.  Musculoskeletal:        General: No edema.  Neurological: He is alert and oriented to person, place, and time.  Skin: Skin is warm and dry. No cyanosis. Nails show no clubbing.  Psychiatric: He has a normal mood and affect. His speech is normal and behavior is normal. Judgment and thought content normal.     EKG:  Was ordered and interpreted by me today. Shows atrial flutter with variable AV block, 87 bpm, RBBB  Recent Labs: 06/06/2018: ALT 14; TSH 2.430 06/07/2018: BUN 13; Creatinine, Ser 0.71; Potassium 3.8; Sodium 139 06/08/2018: Hemoglobin 11.6; Platelets 192  No results found for requested labs within last 8760 hours.   CrCl cannot be calculated (Patient's most recent lab result is older than the maximum 21 days allowed.).   Wt Readings from Last 3 Encounters:  07/01/18 199 lb 8 oz (90.5 kg)  06/08/18 184 lb 6.4 oz (83.6 kg)  05/06/16 184 lb 8 oz (83.7 kg)     Other studies reviewed: Additional studies/records reviewed today include: summarized above  ASSESSMENT AND PLAN:  1. Persistent atrial flutter: Ventricular rate is reasonably controlled in the mid to upper 80s bpm currently.  He had to be transitioned off diltiazem secondary to presumed drug reaction.  Ventricular rates remained well controlled on recently started metoprolol tartrate 25 mg twice daily.  He has not been maintained on long-term, full dose oral anticoagulation in the setting of frequent, sometimes life-threatening, GI bleeds.  He is aware of increased stroke risk given his CHADS2VASc of at least 4 (HTN, age x 2, vascular disease).  Increase Lopressor to 50 mg twice daily.  Based on his history, it appears he has been in atrial flutter for quite some time.  2. Recurrent GI  bleed with acute blood loss anemia: Scheduled to see [facility] colorectal surgery on 07/04/2018.  Based on revised cardiac index, the patient would be low risk for a high risk noncardiac surgery with a 0.9% estimated rate of adverse cardiac event in the perioperative timeframe.  He is able to achieve at least 4 METs without limitation from a cardiac perspective.  No further cardiac testing is needed at this time.  I did discuss with the patient the high likelihood of him redeveloping a rapid ventricular rate in the perioperative timeframe should surgery be elected.  Recent CBC demonstrated stable hemoglobin.  3. OSA: Continue CPAP.  4. Hyperlipidemia/aortic atherosclerosis: Continue  simvastatin.  Managed by PCP.  Disposition: F/u with Dr. Rockey Situ or an APP in 3 months.  Current medicines are reviewed at length with the patient today.  The patient did not have any concerns regarding medicines.  Signed, Christell Faith, PA-C 07/01/2018 11:38 AM     [place] [address] [place] [address] [place], [place] [address] [phone]

## 2018-06-30 ENCOUNTER — Encounter: Payer: Self-pay | Admitting: Physician Assistant

## 2018-07-01 ENCOUNTER — Ambulatory Visit (INDEPENDENT_AMBULATORY_CARE_PROVIDER_SITE_OTHER): Payer: Medicare Other | Admitting: Physician Assistant

## 2018-07-01 ENCOUNTER — Encounter: Payer: Self-pay | Admitting: Physician Assistant

## 2018-07-01 VITALS — BP 138/76 | HR 87 | Ht 71.0 in | Wt 199.5 lb

## 2018-07-01 DIAGNOSIS — K922 Gastrointestinal hemorrhage, unspecified: Secondary | ICD-10-CM

## 2018-07-01 DIAGNOSIS — D62 Acute posthemorrhagic anemia: Secondary | ICD-10-CM

## 2018-07-01 DIAGNOSIS — Z9989 Dependence on other enabling machines and devices: Secondary | ICD-10-CM

## 2018-07-01 DIAGNOSIS — G4733 Obstructive sleep apnea (adult) (pediatric): Secondary | ICD-10-CM

## 2018-07-01 DIAGNOSIS — I700 Atherosclerosis of aorta: Secondary | ICD-10-CM

## 2018-07-01 DIAGNOSIS — I4892 Unspecified atrial flutter: Secondary | ICD-10-CM

## 2018-07-01 DIAGNOSIS — E782 Mixed hyperlipidemia: Secondary | ICD-10-CM

## 2018-07-01 MED ORDER — METOPROLOL TARTRATE 50 MG PO TABS: 50.0000 mg | ORAL_TABLET | Freq: Two times a day (BID) | ORAL | 3 refills | Status: DC

## 2018-07-01 NOTE — Patient Instructions (Signed)
Medication Instructions:  Your physician has recommended you make the following change in your medication:  1- INCREASE Lopressor to Take 1 tablet (50 mg total) by mouth 2 (two) times daily If you need a refill on your cardiac medications before your next appointment, please call your pharmacy.   Lab work: None ordered  If you have labs (blood work) drawn today and your tests are completely normal, you will receive your results only by: Marland Kitchen MyChart Message (if you have MyChart) OR . A paper copy in the mail If you have any lab test that is abnormal or we need to change your treatment, we will call you to review the results.  Testing/Procedures: None ordered   Follow-Up: At [facility], you and your health needs are our priority.  As part of our continuing mission to provide you with exceptional heart care, we have created designated Provider Care Teams.  These Care Teams include your primary Cardiologist (physician) and Advanced Practice Providers (APPs -  Physician Assistants and Nurse Practitioners) who all work together to provide you with the care you need, when you need it. You will need a follow up appointment in 3 months.  You may see Dr. Rockey Situ or Christell Faith, PA-C

## 2018-07-04 ENCOUNTER — Ambulatory Visit: Admit: 2018-07-04 | Discharge: 2018-07-04 | Payer: MEDICARE

## 2018-07-04 DIAGNOSIS — G4733 Obstructive sleep apnea (adult) (pediatric): Secondary | ICD-10-CM

## 2018-07-04 DIAGNOSIS — C61 Malignant neoplasm of prostate: Secondary | ICD-10-CM

## 2018-07-04 DIAGNOSIS — K5790 Diverticulosis of intestine, part unspecified, without perforation or abscess without bleeding: Principal | ICD-10-CM

## 2018-07-04 DIAGNOSIS — K219 Gastro-esophageal reflux disease without esophagitis: Secondary | ICD-10-CM

## 2018-07-04 DIAGNOSIS — I10 Essential (primary) hypertension: Secondary | ICD-10-CM

## 2018-07-04 DIAGNOSIS — K5730 Diverticulosis of large intestine without perforation or abscess without bleeding: Secondary | ICD-10-CM

## 2018-07-04 DIAGNOSIS — E785 Hyperlipidemia, unspecified: Secondary | ICD-10-CM

## 2018-07-04 DIAGNOSIS — Z01818 Encounter for other preprocedural examination: Principal | ICD-10-CM

## 2018-07-08 MED ORDER — ERYTHROMYCIN 250 MG TABLET
ORAL_TABLET | 0 refills | 0 days | Status: SS
Start: 2018-07-08 — End: 2018-07-16

## 2018-07-08 MED ORDER — NEOMYCIN 500 MG TABLET
ORAL_TABLET | 0 refills | 0 days | Status: SS
Start: 2018-07-08 — End: 2018-07-16

## 2018-07-11 DIAGNOSIS — K5731 Diverticulosis of large intestine without perforation or abscess with bleeding: Principal | ICD-10-CM

## 2018-07-12 ENCOUNTER — Encounter
Admit: 2018-07-12 | Discharge: 2018-07-18 | Disposition: A | Discharge status: Home Health | Payer: MEDICARE | Attending: Certified Registered"

## 2018-07-12 ENCOUNTER — Ambulatory Visit: Admit: 2018-07-12 | Discharge: 2018-07-18 | Disposition: A | Discharge status: Home Health | Payer: MEDICARE

## 2018-07-12 DIAGNOSIS — K5731 Diverticulosis of large intestine without perforation or abscess with bleeding: Principal | ICD-10-CM

## 2018-07-12 HISTORY — PX: COLECTOMY: SHX59

## 2018-07-14 DIAGNOSIS — C61 Malignant neoplasm of prostate: Principal | ICD-10-CM

## 2018-07-14 DIAGNOSIS — Z8719 Personal history of other diseases of the digestive system: Principal | ICD-10-CM

## 2018-07-15 DIAGNOSIS — Z8719 Personal history of other diseases of the digestive system: Principal | ICD-10-CM

## 2018-07-15 DIAGNOSIS — C61 Malignant neoplasm of prostate: Principal | ICD-10-CM

## 2018-07-18 DIAGNOSIS — Z8719 Personal history of other diseases of the digestive system: Principal | ICD-10-CM

## 2018-07-18 DIAGNOSIS — C61 Malignant neoplasm of prostate: Principal | ICD-10-CM

## 2018-07-18 MED ORDER — OXYCODONE 5 MG TABLET
ORAL_TABLET | ORAL | 0 refills | 0.00000 days | Status: CP | PRN
Start: 2018-07-18 — End: 2018-07-26
  Filled 2018-07-18: qty 30, 5d supply, fill #0

## 2018-07-18 MED FILL — OXYCODONE 5 MG TABLET: 5 days supply | Qty: 30 | Fill #0 | Status: AC

## 2018-07-26 ENCOUNTER — Ambulatory Visit: Admit: 2018-07-26 | Discharge: 2018-07-27 | Payer: MEDICARE | Attending: Trauma Surgery | Primary: Trauma Surgery

## 2018-07-26 DIAGNOSIS — Z9889 Other specified postprocedural states: Principal | ICD-10-CM

## 2018-07-26 DIAGNOSIS — C61 Malignant neoplasm of prostate: Principal | ICD-10-CM

## 2018-07-26 DIAGNOSIS — Z8719 Personal history of other diseases of the digestive system: Principal | ICD-10-CM

## 2018-07-26 DIAGNOSIS — Z9049 Acquired absence of other specified parts of digestive tract: Principal | ICD-10-CM

## 2018-08-02 DIAGNOSIS — K9419 Other complications of enterostomy: Secondary | ICD-10-CM | POA: Insufficient documentation

## 2018-08-05 ENCOUNTER — Inpatient Hospital Stay: Payer: Medicare Other

## 2018-08-05 ENCOUNTER — Encounter: Payer: Self-pay | Admitting: Emergency Medicine

## 2018-08-05 ENCOUNTER — Inpatient Hospital Stay
Admission: EM | Admit: 2018-08-05 | Discharge: 2018-08-10 | DRG: 389 | Disposition: A | Discharge status: Home Health | Payer: Medicare Other | Attending: General Surgery | Admitting: General Surgery

## 2018-08-05 ENCOUNTER — Emergency Department: Payer: Medicare Other

## 2018-08-05 ENCOUNTER — Other Ambulatory Visit: Payer: Self-pay

## 2018-08-05 DIAGNOSIS — N400 Enlarged prostate without lower urinary tract symptoms: Secondary | ICD-10-CM | POA: Diagnosis present

## 2018-08-05 DIAGNOSIS — K56609 Unspecified intestinal obstruction, unspecified as to partial versus complete obstruction: Secondary | ICD-10-CM | POA: Diagnosis present

## 2018-08-05 DIAGNOSIS — Z932 Ileostomy status: Secondary | ICD-10-CM

## 2018-08-05 DIAGNOSIS — K56600 Partial intestinal obstruction, unspecified as to cause: Principal | ICD-10-CM | POA: Diagnosis present

## 2018-08-05 DIAGNOSIS — E785 Hyperlipidemia, unspecified: Secondary | ICD-10-CM | POA: Diagnosis present

## 2018-08-05 DIAGNOSIS — Z79890 Hormone replacement therapy (postmenopausal): Secondary | ICD-10-CM

## 2018-08-05 DIAGNOSIS — G4733 Obstructive sleep apnea (adult) (pediatric): Secondary | ICD-10-CM | POA: Diagnosis present

## 2018-08-05 DIAGNOSIS — Z87891 Personal history of nicotine dependence: Secondary | ICD-10-CM

## 2018-08-05 DIAGNOSIS — C61 Malignant neoplasm of prostate: Secondary | ICD-10-CM

## 2018-08-05 DIAGNOSIS — G629 Polyneuropathy, unspecified: Secondary | ICD-10-CM | POA: Diagnosis present

## 2018-08-05 DIAGNOSIS — K219 Gastro-esophageal reflux disease without esophagitis: Secondary | ICD-10-CM | POA: Diagnosis present

## 2018-08-05 DIAGNOSIS — Z8546 Personal history of malignant neoplasm of prostate: Secondary | ICD-10-CM | POA: Diagnosis not present

## 2018-08-05 DIAGNOSIS — K5730 Diverticulosis of large intestine without perforation or abscess without bleeding: Secondary | ICD-10-CM

## 2018-08-05 DIAGNOSIS — Z0189 Encounter for other specified special examinations: Secondary | ICD-10-CM

## 2018-08-05 DIAGNOSIS — I10 Essential (primary) hypertension: Secondary | ICD-10-CM | POA: Diagnosis present

## 2018-08-05 DIAGNOSIS — I4892 Unspecified atrial flutter: Secondary | ICD-10-CM | POA: Diagnosis present

## 2018-08-05 DIAGNOSIS — Z9049 Acquired absence of other specified parts of digestive tract: Secondary | ICD-10-CM | POA: Diagnosis not present

## 2018-08-05 HISTORY — DX: Essential (primary) hypertension: I10

## 2018-08-05 LAB — COMPREHENSIVE METABOLIC PANEL
ALT: 11 U/L (ref 0–44)
AST: 15 U/L (ref 15–41)
Albumin: 4 g/dL (ref 3.5–5.0)
Alkaline Phosphatase: 79 U/L (ref 38–126)
Anion gap: 10 (ref 5–15)
BUN: 18 mg/dL (ref 8–23)
CO2: 32 mmol/L (ref 22–32)
Calcium: 9.5 mg/dL (ref 8.9–10.3)
Chloride: 92 mmol/L — ABNORMAL LOW (ref 98–111)
Creatinine, Ser: 1.1 mg/dL (ref 0.61–1.24)
GFR calc non Af Amer: >60 mL/min (ref >60)
Glucose, Bld: 140 mg/dL — ABNORMAL HIGH (ref 70–99)
Potassium: 3.8 mmol/L (ref 3.5–5.1)
Sodium: 134 mmol/L — ABNORMAL LOW (ref 135–145)
Total Bilirubin: 0.7 mg/dL (ref 0.3–1.2)
Total Protein: 8 g/dL (ref 6.5–8.1)

## 2018-08-05 LAB — URINALYSIS, COMPLETE (UACMP) WITH MICROSCOPIC
Bacteria, UA: NONE SEEN
Bilirubin Urine: NEGATIVE
Glucose, UA: NEGATIVE mg/dL
Hgb urine dipstick: NEGATIVE
Ketones, ur: 20 mg/dL — AB
Leukocytes,Ua: NEGATIVE
Nitrite: NEGATIVE
Protein, ur: NEGATIVE mg/dL
Specific Gravity, Urine: >1.046 — ABNORMAL HIGH (ref 1.005–1.030)
Squamous Epithelial / HPF: NONE SEEN (ref 0–5)
pH: 6 (ref 5.0–8.0)

## 2018-08-05 LAB — CBC
HCT: 40.2 % (ref 39.0–52.0)
Hemoglobin: 13.2 g/dL (ref 13.0–17.0)
MCH: 31.7 pg (ref 26.0–34.0)
MCHC: 32.8 g/dL (ref 30.0–36.0)
MCV: 96.4 fL (ref 80.0–100.0)
NRBC: 0 % (ref 0.0–0.2)
Platelets: 385 10*3/uL (ref 150–400)
RBC: 4.17 MIL/uL — ABNORMAL LOW (ref 4.22–5.81)
RDW: 12.8 % (ref 11.5–15.5)
WBC: 6.7 10*3/uL (ref 4.0–10.5)

## 2018-08-05 LAB — LIPASE, BLOOD: Lipase: 23 U/L (ref 11–51)

## 2018-08-05 MED ORDER — ENOXAPARIN SODIUM 40 MG/0.4ML ~~LOC~~ SOLN
40.0000 mg | SUBCUTANEOUS | Status: DC
  Administered 2018-08-05 – 2018-08-09 (×4): 40 mg via SUBCUTANEOUS
  Filled 2018-08-05 (×4): qty 0.4

## 2018-08-05 MED ORDER — ACETAMINOPHEN 650 MG RE SUPP: 650.0000 mg | Freq: Four times a day (QID) | RECTAL | Status: DC | PRN

## 2018-08-05 MED ORDER — HYDROCODONE-ACETAMINOPHEN 5-325 MG PO TABS
1.0000 | ORAL_TABLET | ORAL | Status: DC | PRN
  Administered 2018-08-06 (×2): 2 via ORAL
  Filled 2018-08-05 (×2): qty 2

## 2018-08-05 MED ORDER — FENTANYL CITRATE (PF) 100 MCG/2ML IJ SOLN
75.0000 ug | Freq: Once | INTRAMUSCULAR | Status: AC
  Administered 2018-08-05: 75 ug via INTRAVENOUS
  Filled 2018-08-05: qty 2

## 2018-08-05 MED ORDER — TAMSULOSIN HCL 0.4 MG PO CAPS
0.4000 mg | ORAL_CAPSULE | Freq: Every day | ORAL | Status: DC
  Administered 2018-08-07 – 2018-08-10 (×3): 0.4 mg via ORAL
  Filled 2018-08-05 (×5): qty 1

## 2018-08-05 MED ORDER — ONDANSETRON 4 MG PO TBDP: 4.0000 mg | ORAL_TABLET | Freq: Four times a day (QID) | ORAL | Status: DC | PRN

## 2018-08-05 MED ORDER — MENTHOL 3 MG MT LOZG
1.0000 | LOZENGE | OROMUCOSAL | Status: DC | PRN
  Administered 2018-08-05 – 2018-08-06 (×2): 3 mg via ORAL
  Filled 2018-08-05: qty 9

## 2018-08-05 MED ORDER — MORPHINE SULFATE (PF) 2 MG/ML IV SOLN
2.0000 mg | INTRAVENOUS | Status: DC | PRN
  Administered 2018-08-05 – 2018-08-06 (×5): 2 mg via INTRAVENOUS
  Filled 2018-08-05 (×5): qty 1

## 2018-08-05 MED ORDER — FAMOTIDINE IN NACL 20-0.9 MG/50ML-% IV SOLN
20.0000 mg | Freq: Two times a day (BID) | INTRAVENOUS | Status: DC
  Administered 2018-08-05 – 2018-08-08 (×7): 20 mg via INTRAVENOUS
  Filled 2018-08-05 (×7): qty 50

## 2018-08-05 MED ORDER — CHLORHEXIDINE GLUCONATE 0.12 % MT SOLN
15.0000 mL | Freq: Two times a day (BID) | OROMUCOSAL | Status: DC
  Administered 2018-08-05 – 2018-08-09 (×7): 15 mL via OROMUCOSAL
  Filled 2018-08-05 (×8): qty 15

## 2018-08-05 MED ORDER — PHENOL 1.4 % MT LIQD
1.0000 | OROMUCOSAL | Status: DC | PRN
  Filled 2018-08-05: qty 177

## 2018-08-05 MED ORDER — ONDANSETRON HCL 4 MG/2ML IJ SOLN: 4.0000 mg | Freq: Four times a day (QID) | INTRAMUSCULAR | Status: DC | PRN

## 2018-08-05 MED ORDER — HYDRALAZINE HCL 20 MG/ML IJ SOLN: 10.0000 mg | Freq: Four times a day (QID) | INTRAMUSCULAR | Status: DC | PRN

## 2018-08-05 MED ORDER — IOHEXOL 300 MG/ML  SOLN
100.0000 mL | Freq: Once | INTRAMUSCULAR | Status: AC | PRN
  Administered 2018-08-05: 100 mL via INTRAVENOUS

## 2018-08-05 MED ORDER — SODIUM CHLORIDE 0.9 % IV SOLN
INTRAVENOUS | Status: DC
  Administered 2018-08-05 – 2018-08-08 (×6): via INTRAVENOUS

## 2018-08-05 MED ORDER — GABAPENTIN 300 MG PO CAPS
300.0000 mg | ORAL_CAPSULE | Freq: Three times a day (TID) | ORAL | Status: DC
  Administered 2018-08-07 – 2018-08-10 (×5): 300 mg via ORAL
  Filled 2018-08-05 (×9): qty 1

## 2018-08-05 MED ORDER — SODIUM CHLORIDE 0.9 % IV BOLUS
1000.0000 mL | Freq: Once | INTRAVENOUS | Status: AC
  Administered 2018-08-05: 1000 mL via INTRAVENOUS

## 2018-08-05 MED ORDER — ONDANSETRON HCL 4 MG/2ML IJ SOLN
4.0000 mg | Freq: Once | INTRAMUSCULAR | Status: AC
  Administered 2018-08-05: 4 mg via INTRAVENOUS
  Filled 2018-08-05: qty 2

## 2018-08-05 MED ORDER — ORAL CARE MOUTH RINSE
15.0000 mL | Freq: Two times a day (BID) | OROMUCOSAL | Status: DC
  Administered 2018-08-07 – 2018-08-09 (×4): 15 mL via OROMUCOSAL

## 2018-08-05 MED ORDER — ACETAMINOPHEN 325 MG PO TABS
650.0000 mg | ORAL_TABLET | Freq: Four times a day (QID) | ORAL | Status: DC | PRN
  Administered 2018-08-08: 650 mg via ORAL
  Filled 2018-08-05 (×2): qty 2

## 2018-08-05 MED ORDER — METOPROLOL TARTRATE 50 MG PO TABS
50.0000 mg | ORAL_TABLET | Freq: Two times a day (BID) | ORAL | Status: DC
  Administered 2018-08-06 – 2018-08-10 (×7): 50 mg via ORAL
  Filled 2018-08-05 (×7): qty 1

## 2018-08-05 NOTE — Consult Note (Signed)
[place] [facility] [place] NAME: Ryan Allison    MR#:  [id number]  DATE OF BIRTH:  05/01/1941  DATE OF CONSULT:  08/05/2018  PRIMARY CARE PHYSICIAN: Adin Hector, MD   REQUESTING/REFERRING PHYSICIAN: Dr. Windell Moment  CHIEF COMPLAINT:   Chief Complaint  Patient presents with  . Abdominal Pain    HISTORY OF PRESENT ILLNESS:  Ryan Allison  is a 78 y.o. male with a known history of essential hypertension, hyperlipidemia, obstructive sleep apnea, neuropathy, history of prostate cancer, previous history of GI bleed, atrial flutter who presents to the hospital due to abdominal pain.  Patient says he has generalized abdominal pain located in this center of his abdomen nonradiating.  He also admits to some nausea vomiting associate with abdominal pain and the vomiting has been nonbloody and bilious in nature.  Denies any fevers or chills or cough or congestion or any chest pains, shortness of breath.  He was recently hospitalized at [facility] for a total colectomy due to recurrent GI bleed and diverticulosis.  He actually followed up with his primary care physician this past Friday and was still having abdominal pain and as per the PCP they are planning on doing a CT scan of the abdomen pelvis if his symptoms were not improving over the weekend.  Since he has not improved he came to the ER for further evaluation, underwent a CT of the abdomen pelvis which is suggestive of partial small bowel obstruction.  Patient was admitted to the surgical service with hospitalist services were contacted for consultation for management of his medical problems.  PAST MEDICAL HISTORY:   Past Medical History:  Diagnosis Date  . Aortic atherosclerosis ([place])   . Arthritis   . Atrial flutter ([place])    a. diagnosed 05/2018; b. not on [place] 2/2 recurrent life threatening GI bleed; c. CHADS2VASc => 3 (age x 2, vascular disease)  . Cancer [facility] 2011   prostate  . GI bleed   . History  of echocardiogram    a. TTE 05/2018: EF of 60-65%, normal wall motion, mild MR, left atrium normal in size, RVSF normal, PASP 33 mmHg, rhythm was atrial flutter  . History of GI diverticular bleed    extended stay in the [facility] ICU due to GI bleeding prior to 2016  . HLD (hyperlipidemia)   . Hyperlipidemia   . Hypertension   . Meniscus tear   . OSA on CPAP   . Peripheral neuropathy     PAST SURGICAL HISTOIRY:   Past Surgical History:  Procedure Laterality Date  . COLONOSCOPY Left 06/07/2018   Procedure: COLONOSCOPY;  Surgeon: Virgel Manifold, MD;  Location: [facility] ENDOSCOPY;  Service: Endoscopy;  Laterality: Left;  . COLONOSCOPY WITH PROPOFOL N/A 04/06/2015   Procedure: COLONOSCOPY WITH PROPOFOL;  Surgeon: Lollie Sails, MD;  Location: [facility] ENDOSCOPY;  Service: Endoscopy;  Laterality: N/A;  . COLONOSCOPY WITH PROPOFOL N/A 06/07/2018   Procedure: COLONOSCOPY WITH PROPOFOL;  Surgeon: Virgel Manifold, MD;  Location: [facility] ENDOSCOPY;  Service: Endoscopy;  Laterality: N/A;  . HERNIA REPAIR Left 09-23-14   inguinal  . INGUINAL HERNIA REPAIR Left 09/23/2014   Procedure: HERNIA REPAIR INGUINAL ADULT;  Surgeon: Christene Lye, MD;  Location: [facility] ORS;  Service: General;  Laterality: Left;  . MENISCUS REPAIR  2008  . VASCULAR SURGERY  2015    SOCIAL HISTORY:   Social History   Tobacco Use  . Smoking status: Former Research scientist (life sciences)  . Smokeless  tobacco: Never Used  Substance Use Topics  . Alcohol use: No    FAMILY HISTORY:   Family History  Problem Relation Age of Onset  . Dementia Mother   . Cancer Father   . Cancer Brother     DRUG ALLERGIES:   Allergies  Allergen Reactions  . Nsaids Other (See Comments)    Diverticular bleed Diverticular bleed  . Tolmetin     Other reaction(s): Other (See Comments) Diverticular bleed Diverticular bleed  . Celebrex [Celecoxib] Itching and Rash  . Diltiazem Hcl Rash  . Other Other (See Comments)    Diverticular bleed  .  Venlafaxine Other (See Comments)    sedation sedation    REVIEW OF SYSTEMS:   Review of Systems  Constitutional: Negative for fever and weight loss.  HENT: Negative for congestion, nosebleeds and tinnitus.   Eyes: Negative for blurred vision, double vision and redness.  Respiratory: Negative for cough, hemoptysis and shortness of breath.   Cardiovascular: Negative for chest pain, orthopnea, leg swelling and PND.  Gastrointestinal: Positive for abdominal pain, nausea and vomiting. Negative for diarrhea and melena.  Genitourinary: Negative for dysuria, hematuria and urgency.  Musculoskeletal: Negative for falls and joint pain.  Neurological: Negative for dizziness, tingling, sensory change, focal weakness, seizures, weakness and headaches.  Endo/Heme/Allergies: Negative for polydipsia. Does not bruise/bleed easily.  Psychiatric/Behavioral: Negative for depression and memory loss. The patient is not nervous/anxious.      MEDICATIONS AT HOME:   Prior to Admission medications   Medication Sig Start Date End Date Taking? Authorizing Provider  acetaminophen (TYLENOL) 500 MG tablet Take 500 mg by mouth daily as needed for fever or pain.    Yes [provider]  bicalutamide (CASODEX) 50 MG tablet Take 50 mg by mouth daily. 05/16/18  Yes [provider]  cyanocobalamin 1000 MCG tablet Take 1,000 mcg by mouth daily.   Yes [provider]  gabapentin (NEURONTIN) 600 MG tablet Take 600 mg by mouth 5 (five) times daily.    Yes [provider]  metoprolol tartrate (LOPRESSOR) 50 MG tablet Take 1 tablet (50 mg total) by mouth 2 (two) times daily. 07/01/18  Yes Dunn, Areta Haber, PA-C  omeprazole (PRILOSEC) 40 MG capsule Take 40 mg by mouth daily.   Yes [provider]  polyethylene glycol (MIRALAX) packet Take 17 g by mouth daily as needed for mild constipation or moderate constipation.    Yes [provider]  simvastatin (ZOCOR) 20 MG tablet Take 20 mg  by mouth at bedtime.    Yes [provider]  tamsulosin (FLOMAX) 0.4 MG CAPS capsule Take 0.4 mg by mouth daily.    Yes [provider]  traMADol (ULTRAM) 50 MG tablet Take 50 mg by mouth every 6 (six) hours as needed for moderate pain.    Yes [provider]      VITAL SIGNS:  Blood pressure (!) 145/66, pulse 99, temperature 97.8 F (36.6 C), temperature source Oral, resp. rate 16, height 5\' 11"  (1.803 m), weight 81.6 kg, SpO2 98 %.  PHYSICAL EXAMINATION:  GENERAL:  78 y.o.-year-old patient lying in the bed with no acute distress.  EYES: Pupils equal, round, reactive to light and accommodation. No scleral icterus. Extraocular muscles intact.  HEENT: Head atraumatic, normocephalic. NG tube in place with bilious drainage noted. NECK:  Supple, no jugular venous distention. No thyroid enlargement, no tenderness.  LUNGS: Normal breath sounds bilaterally, no wheezing, rales, rhonchi . No use of accessory muscles of respiration.  CARDIOVASCULAR: S1, S2, RRR. No murmurs, rubs, gallops, clicks.  ABDOMEN: Soft, tender diffusely, hypoactive bowel sounds.  Positive colostomy in place with some brown/green stool noted.  No rebound, rigidity, no organomegaly. EXTREMITIES: No pedal edema, cyanosis, or clubbing.  NEUROLOGIC: Cranial nerves II through XII are intact. No focal motor or sensory deficits appreciated bilaterally  PSYCHIATRIC: The patient is alert and oriented x 3. Good affect SKIN: No obvious rash, lesion, or ulcer.   LABORATORY PANEL:   CBC Recent Labs  Lab 08/05/18 1244  WBC 6.7  HGB 13.2  HCT 40.2  PLT 385   ------------------------------------------------------------------------------------------------------------------  Chemistries  Recent Labs  Lab 08/05/18 1244  NA 134*  K 3.8  CL 92*  CO2 32  GLUCOSE 140*  BUN 18  CREATININE 1.10  CALCIUM 9.5  AST 15  ALT 11  ALKPHOS 79  BILITOT 0.7    ------------------------------------------------------------------------------------------------------------------  Cardiac Enzymes No results for input(s): TROPONINI in the last 168 hours. ------------------------------------------------------------------------------------------------------------------  RADIOLOGY:  Ct Abdomen Pelvis W Contrast  Result Date: 08/05/2018 CLINICAL DATA:  Abdominal pain, vomiting since last night, no fever EXAM: CT ABDOMEN AND PELVIS WITH CONTRAST TECHNIQUE: Multidetector CT imaging of the abdomen and pelvis was performed using the standard protocol following bolus administration of intravenous contrast. CONTRAST:  169mL OMNIPAQUE IOHEXOL 300 MG/ML  SOLN COMPARISON:  09/27/2014 FINDINGS: Lower chest: Mild chronic interstitial lung disease. No focal consolidation. Hepatobiliary: No focal liver abnormality is seen. No gallstones, gallbladder wall thickening, or biliary dilatation. Pancreas: Unremarkable. No pancreatic ductal dilatation or surrounding inflammatory changes. Spleen: Normal in size without focal abnormality. Adrenals/Urinary Tract: Normal adrenal glands. 3.2 cm hypodense, fluid attenuating left renal mass most consistent with a cyst. Multiple nonobstructing right renal calculi. Normal bladder. Stomach/Bowel: No pneumatosis, pneumoperitoneum or portal venous gas. Prior total colectomy. Right lower quadrant ileostomy. Numerous small bowel air-fluid levels with dilatation measuring up to 3.4 cm with a transition point at the ostomy site as it traverses the right rectus abdominus muscle. Mild distention of the stomach which is fluid-filled. Vascular/Lymphatic: Aortic atherosclerosis. No enlarged abdominal or pelvic lymph nodes. Reproductive: Prostate is unremarkable. Other: No abdominal wall hernia or abnormality. No abdominopelvic ascites. Musculoskeletal: No acute or significant osseous findings. Lower lumbar spine spondylosis. IMPRESSION: 1. Right lower quadrant  ileostomy. Numerous small bowel air-fluid levels with dilatation measuring up to 3.4 cm with a transition point at the ostomy site as it traverses the right rectus abdominus muscle. Findings most concerning for small bowel obstruction. 2.  Aortic Atherosclerosis (ICD10-170.0) Electronically Signed   By: Kathreen Devoid   On: 08/05/2018 14:20   Dg Chest Portable 1 View  Result Date: 08/05/2018 CLINICAL DATA:  Vomiting last night. Status post colon removal 3 weeks ago. Status post ileostomy. NG tube placement. EXAM: PORTABLE CHEST 1 VIEW COMPARISON:  None. FINDINGS: The heart, hila, and mediastinum are normal. No pneumothorax. No nodules or masses. Mild bibasilar atelectasis. No suspicious infiltrates. An NG tube is been placed. The distal tip terminates in the abdomen, below this film. IMPRESSION: 1. The NG tube terminates below the left hemidiaphragm. The distal tip terminates below today's film and is not visualized. Electronically Signed   By: Dorise Bullion III M.D   On: 08/05/2018 16:02     IMPRESSION AND PLAN:   78 year old male with past medical history of essential hypertension, hyperlipidemia, obstructive sleep apnea, neuropathy, history of prostate cancer, previous history of GI bleed, atrial flutter who presents to the hospital due to abdominal pain.  1.  Small bowel obstruction-this is a cause of patient abdominal pain nausea vomiting.  Patient CT scan of the abdomen pelvis concerning for this. -Patient has been admitted to the surgical service.  Continue supportive care with NG tube decompression, IV fluids, antiemetics and pain control.  Further care as per general surgery.  2.  Essential hypertension- blood pressures currently stable.  Patient cannot take p.o. due to SBO.  Hold metoprolol. -We will place on some IV hydralazine as needed.  3.  GERD-continue IV Protonix.  4.  Hyperlipidemia-we will resume Zocor when patient can take p.o.  5.  History of prostate cancer/BPH-no evidence  of urinary retention presently.  Will resume patient's Casodex and Flomax when appropriate.  Thank you so much for the consultation will follow along with you.    All the records are reviewed and case discussed with Consulting provider. Management plans discussed with the patient, family and they are in agreement.  CODE STATUS: Full code  TOTAL TIME TAKING CARE OF THIS PATIENT: 45 minutes.    Henreitta Leber M.D on 08/05/2018 at 4:59 PM  Between 7am to 6pm - Pager - [phone]  After 6pm go to [email or web address] - password [email or web address] [facility]  [facility]  Office  [phone]  CC: Primary care Physician: Adin Hector, MD

## 2018-08-05 NOTE — ED Notes (Signed)
ED TO INPATIENT HANDOFF REPORT  ED Nurse Name and Phone #: Anderson Malta [phone]  S Name/Age/Gender [name] 78 y.o. male Room/Bed: ED05A/ED05A  Code Status   Code Status: Full Code  Home/SNF/Other Home Patient oriented to: self, place, time and situation Is this baseline? Yes   Triage Complete: Triage complete  Chief Complaint sent by dr/abd pain  Triage Note Pt had part of colon removed at [facility] 3 weeks ago. Having pain across lower abdomen.  Pt sent for possible CT. Pt had vomiting last night with pain. S/p ileostomy from [facility].  Denies fever.    Allergies Allergies  Allergen Reactions  . Nsaids Other (See Comments)    Diverticular bleed Diverticular bleed  . Tolmetin     Other reaction(s): Other (See Comments) Diverticular bleed Diverticular bleed  . Celebrex [Celecoxib] Itching and Rash  . Diltiazem Hcl Rash  . Other Other (See Comments)    Diverticular bleed  . Venlafaxine Other (See Comments)    sedation sedation    Level of Care/Admitting Diagnosis ED Disposition    ED Disposition Condition [place] [facility] Area: [place] [[id number]]  Level of Care: Med-Surg [16]  Diagnosis: Small bowel obstruction [facility]) [[id number]]  Admitting Physician: Herbert Pun [[id number]]  Attending Physician: Herbert Pun [[id number]]  Estimated length of stay: past midnight tomorrow  Certification:: I certify this patient will need inpatient services for at least 2 midnights  PT Class (Do Not Modify): Inpatient [101]  PT Acc Code (Do Not Modify): Private [1]       B Medical/Surgery History Past Medical History:  Diagnosis Date  . Aortic atherosclerosis ([place])   . Arthritis   . Atrial flutter ([place])    a. diagnosed 05/2018; b. not on [place] 2/2 recurrent life threatening GI bleed; c. CHADS2VASc => 3 (age x 2, vascular disease)  . Cancer [facility]) 2011   prostate  . GI bleed   . History of echocardiogram    a. TTE 05/2018: EF of  60-65%, normal wall motion, mild MR, left atrium normal in size, RVSF normal, PASP 33 mmHg, rhythm was atrial flutter  . History of GI diverticular bleed    extended stay in the [facility] ICU due to GI bleeding prior to 2016  . HLD (hyperlipidemia)   . Hyperlipidemia   . Hypertension   . Meniscus tear   . OSA on CPAP   . Peripheral neuropathy    Past Surgical History:  Procedure Laterality Date  . COLONOSCOPY Left 06/07/2018   Procedure: COLONOSCOPY;  Surgeon: Virgel Manifold, MD;  Location: [facility] ENDOSCOPY;  Service: Endoscopy;  Laterality: Left;  . COLONOSCOPY WITH PROPOFOL N/A 04/06/2015   Procedure: COLONOSCOPY WITH PROPOFOL;  Surgeon: Lollie Sails, MD;  Location: [facility] ENDOSCOPY;  Service: Endoscopy;  Laterality: N/A;  . COLONOSCOPY WITH PROPOFOL N/A 06/07/2018   Procedure: COLONOSCOPY WITH PROPOFOL;  Surgeon: Virgel Manifold, MD;  Location: [facility] ENDOSCOPY;  Service: Endoscopy;  Laterality: N/A;  . HERNIA REPAIR Left 09-23-14   inguinal  . INGUINAL HERNIA REPAIR Left 09/23/2014   Procedure: HERNIA REPAIR INGUINAL ADULT;  Surgeon: Christene Lye, MD;  Location: [facility] ORS;  Service: General;  Laterality: Left;  . MENISCUS REPAIR  2008  . VASCULAR SURGERY  2015     A IV Location/Drains/Wounds Patient Lines/Drains/Airways Status   Active Line/Drains/Airways    Name:   Placement date:   Placement time:   Site:   Days:   Peripheral IV 08/05/18 Right Antecubital  08/05/18    1242    Antecubital   less than 1   NG/OG Tube Nasogastric 16 Fr. Right nare Xray;Aucultation Documented cm marking at nare/ corner of mouth 57 cm   08/05/18    1538    Right nare   less than 1          Intake/Output Last 24 hours No intake or output data in the 24 hours ending 08/05/18 1710  Labs/Imaging Results for orders placed or performed during the hospital encounter of 08/05/18 (from the past 48 hour(s))  Lipase, blood     Status: None   Collection Time: 08/05/18 12:44 PM  Result Value  Ref Range   Lipase 23 11 - 51 U/L    Comment: Performed at [facility], [address] [place], [place] [address]  Comprehensive metabolic panel     Status: Abnormal   Collection Time: 08/05/18 12:44 PM  Result Value Ref Range   Sodium 134 (L) 135 - 145 mmol/L   Potassium 3.8 3.5 - 5.1 mmol/L   Chloride 92 (L) 98 - 111 mmol/L   CO2 32 22 - 32 mmol/L   Glucose, Bld 140 (H) 70 - 99 mg/dL   BUN 18 8 - 23 mg/dL   Creatinine, Ser 1.10 0.61 - 1.24 mg/dL   Calcium 9.5 8.9 - 10.3 mg/dL   Total Protein 8.0 6.5 - 8.1 g/dL   Albumin 4.0 3.5 - 5.0 g/dL   AST 15 15 - 41 U/L   ALT 11 0 - 44 U/L   Alkaline Phosphatase 79 38 - 126 U/L   Total Bilirubin 0.7 0.3 - 1.2 mg/dL   GFR calc non Af Amer >60 >60 mL/min   GFR calc Af Amer >60 >60 mL/min   Anion gap 10 5 - 15    Comment: Performed at [facility], [address] [place], [place] [address]  CBC     Status: Abnormal   Collection Time: 08/05/18 12:44 PM  Result Value Ref Range   WBC 6.7 4.0 - 10.5 K/uL   RBC 4.17 (L) 4.22 - 5.81 MIL/uL   Hemoglobin 13.2 13.0 - 17.0 g/dL   HCT 40.2 39.0 - 52.0 %   MCV 96.4 80.0 - 100.0 fL   MCH 31.7 26.0 - 34.0 pg   MCHC 32.8 30.0 - 36.0 g/dL   RDW 12.8 11.5 - 15.5 %   Platelets 385 150 - 400 K/uL   nRBC 0.0 0.0 - 0.2 %    Comment: Performed at [facility], [address] [place],  [address]   Ct Abdomen Pelvis W Contrast  Result Date: 08/05/2018 CLINICAL DATA:  Abdominal pain, vomiting since last night, no fever EXAM: CT ABDOMEN AND PELVIS WITH CONTRAST TECHNIQUE: Multidetector CT imaging of the abdomen and pelvis was performed using the standard protocol following bolus administration of intravenous contrast. CONTRAST:  124mL OMNIPAQUE IOHEXOL 300 MG/ML  SOLN COMPARISON:  09/27/2014 FINDINGS: Lower chest: Mild chronic interstitial lung disease. No focal consolidation. Hepatobiliary: No focal liver abnormality is seen. No gallstones, gallbladder wall thickening, or  biliary dilatation. Pancreas: Unremarkable. No pancreatic ductal dilatation or surrounding inflammatory changes. Spleen: Normal in size without focal abnormality. Adrenals/Urinary Tract: Normal adrenal glands. 3.2 cm hypodense, fluid attenuating left renal mass most consistent with a cyst. Multiple nonobstructing right renal calculi. Normal bladder. Stomach/Bowel: No pneumatosis, pneumoperitoneum or portal venous gas. Prior total colectomy. Right lower quadrant ileostomy. Numerous small bowel air-fluid levels with dilatation measuring up to 3.4  cm with a transition point at the ostomy site as it traverses the right rectus abdominus muscle. Mild distention of the stomach which is fluid-filled. Vascular/Lymphatic: Aortic atherosclerosis. No enlarged abdominal or pelvic lymph nodes. Reproductive: Prostate is unremarkable. Other: No abdominal wall hernia or abnormality. No abdominopelvic ascites. Musculoskeletal: No acute or significant osseous findings. Lower lumbar spine spondylosis. IMPRESSION: 1. Right lower quadrant ileostomy. Numerous small bowel air-fluid levels with dilatation measuring up to 3.4 cm with a transition point at the ostomy site as it traverses the right rectus abdominus muscle. Findings most concerning for small bowel obstruction. 2.  Aortic Atherosclerosis (ICD10-170.0) Electronically Signed   By: Kathreen Devoid   On: 08/05/2018 14:20   Dg Chest Portable 1 View  Result Date: 08/05/2018 CLINICAL DATA:  Vomiting last night. Status post colon removal 3 weeks ago. Status post ileostomy. NG tube placement. EXAM: PORTABLE CHEST 1 VIEW COMPARISON:  None. FINDINGS: The heart, hila, and mediastinum are normal. No pneumothorax. No nodules or masses. Mild bibasilar atelectasis. No suspicious infiltrates. An NG tube is been placed. The distal tip terminates in the abdomen, below this film. IMPRESSION: 1. The NG tube terminates below the left hemidiaphragm. The distal tip terminates below today's film and is  not visualized. Electronically Signed   By: Dorise Bullion III M.D   On: 08/05/2018 16:02    Pending Labs Unresulted Labs (From admission, onward)    Start     Ordered   08/06/18 7846  Basic metabolic panel  Tomorrow morning,   STAT     08/05/18 1643   08/06/18 0500  CBC  Tomorrow morning,   STAT     08/05/18 1643   08/05/18 1242  Urinalysis, Complete w Microscopic  ONCE - STAT,   STAT     08/05/18 1241          Vitals/Pain Today's Vitals   08/05/18 1500 08/05/18 1528 08/05/18 1530 08/05/18 1600  BP: (!) 146/84  (!) 169/85 (!) 145/66  Pulse: 75  (!) 106 99  Resp:    16  Temp:      TempSrc:      SpO2: 100%  99% 98%  Weight:      Height:      PainSc:  5       Isolation Precautions No active isolations  Medications Medications  metoprolol tartrate (LOPRESSOR) tablet 50 mg (has no administration in time range)  tamsulosin (FLOMAX) capsule 0.4 mg (has no administration in time range)  enoxaparin (LOVENOX) injection 40 mg (has no administration in time range)  0.9 %  sodium chloride infusion (has no administration in time range)  acetaminophen (TYLENOL) tablet 650 mg (has no administration in time range)    Or  acetaminophen (TYLENOL) suppository 650 mg (has no administration in time range)  HYDROcodone-acetaminophen (NORCO/VICODIN) 5-325 MG per tablet 1-2 tablet (has no administration in time range)  morphine 2 MG/ML injection 2 mg (has no administration in time range)  ondansetron (ZOFRAN-ODT) disintegrating tablet 4 mg (has no administration in time range)    Or  ondansetron (ZOFRAN) injection 4 mg (has no administration in time range)  famotidine (PEPCID) IVPB 20 mg premix (has no administration in time range)  gabapentin (NEURONTIN) capsule 300 mg (has no administration in time range)  sodium chloride 0.9 % bolus 1,000 mL (0 mLs Intravenous Stopped 08/05/18 1538)  ondansetron (ZOFRAN) injection 4 mg (4 mg Intravenous Given 08/05/18 1414)  fentaNYL (SUBLIMAZE)  injection 75 mcg (75 mcg Intravenous Given 08/05/18 1414)  iohexol (OMNIPAQUE) 300 MG/ML solution 100 mL (100 mLs Intravenous Contrast Given 08/05/18 1347)    Mobility walks Low fall risk   Focused Assessments Cardiac Assessment Handoff:    Lab Results  Component Value Date   TROPONINI <0.03 06/07/2018   No results found for: DDIMER Does the Patient currently have chest pain? No     R Recommendations: See Admitting Provider Note  Report given to:   Additional Notes:

## 2018-08-05 NOTE — H&P (Signed)
SURGICAL HISTORY AND PHYSICAL NOTE   HISTORY OF PRESENT ILLNESS (HPI):  78 y.o. male presented to [facility] ED for evaluation of abdominal pain, nausea and vomiting. Patient reports has been having abdominal pain since a week ago.  The abdominal pain is generalized and does not radiate to other part of the body.  He cannot identify one point in the abdomen or is the patient localized.  He reports that the pain has been intermittent but increasing in intensity.  Yesterday night he started having nausea or vomiting.  There is no alleviating or aggravating factor.  He went to see his primary care physician 3 days ago for the same reason.  Since then he has been getting worse and recommendation was to go to the ED.  Patient denies fever or chills.  Patient has a history of total abdominal colectomy with end ileostomy done at [facility] on July 09, 2018.  The indication for the surgery was diverticular bleeding of multiple side of the colon. At the ED today, labs were drawn and does not show any leukocytosis or acidosis.  There is no significant electrolyte disturbance.  CT scan of the abdominal pelvis shows small bowel dilation which is consistent with small bowel obstruction.  I personally reviewed the images.  The transition point seems to be close to the ileostomy.  Surgery is consulted by Dr. Mariea Clonts in this context for evaluation and management of small bowel obstruction.  PAST MEDICAL HISTORY (PMH):  Past Medical History:  Diagnosis Date  . Aortic atherosclerosis ([place])   . Arthritis   . Atrial flutter ([place])    a. diagnosed 05/2018; b. not on [place] 2/2 recurrent life threatening GI bleed; c. CHADS2VASc => 3 (age x 2, vascular disease)  . Cancer [facility]) 2011   prostate  . GI bleed   . History of echocardiogram    a. TTE 05/2018: EF of 60-65%, normal wall motion, mild MR, left atrium normal in size, RVSF normal, PASP 33 mmHg, rhythm was atrial flutter  . History of GI diverticular bleed    extended stay in  the [facility] ICU due to GI bleeding prior to 2016  . HLD (hyperlipidemia)   . Hyperlipidemia   . Hypertension   . Meniscus tear   . OSA on CPAP   . Peripheral neuropathy      PAST SURGICAL HISTORY ([place]):  Past Surgical History:  Procedure Laterality Date  . COLONOSCOPY Left 06/07/2018   Procedure: COLONOSCOPY;  Surgeon: Virgel Manifold, MD;  Location: [facility] ENDOSCOPY;  Service: Endoscopy;  Laterality: Left;  . COLONOSCOPY WITH PROPOFOL N/A 04/06/2015   Procedure: COLONOSCOPY WITH PROPOFOL;  Surgeon: Lollie Sails, MD;  Location: [facility] ENDOSCOPY;  Service: Endoscopy;  Laterality: N/A;  . COLONOSCOPY WITH PROPOFOL N/A 06/07/2018   Procedure: COLONOSCOPY WITH PROPOFOL;  Surgeon: Virgel Manifold, MD;  Location: [facility] ENDOSCOPY;  Service: Endoscopy;  Laterality: N/A;  . HERNIA REPAIR Left 09-23-14   inguinal  . INGUINAL HERNIA REPAIR Left 09/23/2014   Procedure: HERNIA REPAIR INGUINAL ADULT;  Surgeon: Christene Lye, MD;  Location: [facility] ORS;  Service: General;  Laterality: Left;  . MENISCUS REPAIR  2008  . VASCULAR SURGERY  2015     MEDICATIONS:  Prior to Admission medications   Medication Sig Start Date End Date Taking? Authorizing Provider  acetaminophen (TYLENOL) 500 MG tablet Take 500 mg by mouth daily as needed for fever or pain.    Yes [provider]  bicalutamide (CASODEX) 50 MG tablet Take  50 mg by mouth daily. 05/16/18  Yes [provider]  cyanocobalamin 1000 MCG tablet Take 1,000 mcg by mouth daily.   Yes [provider]  gabapentin (NEURONTIN) 600 MG tablet Take 600 mg by mouth 5 (five) times daily.    Yes [provider]  metoprolol tartrate (LOPRESSOR) 50 MG tablet Take 1 tablet (50 mg total) by mouth 2 (two) times daily. 07/01/18  Yes Dunn, Areta Haber, PA-C  omeprazole (PRILOSEC) 40 MG capsule Take 40 mg by mouth daily.   Yes [provider]  polyethylene glycol (MIRALAX) packet Take 17 g by mouth daily as needed for mild  constipation or moderate constipation.    Yes [provider]  simvastatin (ZOCOR) 20 MG tablet Take 20 mg by mouth at bedtime.    Yes [provider]  tamsulosin (FLOMAX) 0.4 MG CAPS capsule Take 0.4 mg by mouth daily.    Yes [provider]  traMADol (ULTRAM) 50 MG tablet Take 50 mg by mouth every 6 (six) hours as needed for moderate pain.    Yes [provider]     ALLERGIES:  Allergies  Allergen Reactions  . Nsaids Other (See Comments)    Diverticular bleed Diverticular bleed  . Tolmetin     Other reaction(s): Other (See Comments) Diverticular bleed Diverticular bleed  . Celebrex [Celecoxib] Itching and Rash  . Diltiazem Hcl Rash  . Other Other (See Comments)    Diverticular bleed  . Venlafaxine Other (See Comments)    sedation sedation     SOCIAL HISTORY:  Social History   Socioeconomic History  . Marital status: Single    Spouse name: Not on file  . Number of children: Not on file  . Years of education: Not on file  . Highest education level: Not on file  Occupational History  . Not on file  Social Needs  . Financial resource strain: Not on file  . Food insecurity:    Worry: Not on file    Inability: Not on file  . Transportation needs:    Medical: Not on file    Non-medical: Not on file  Tobacco Use  . Smoking status: Former Research scientist (life sciences)  . Smokeless tobacco: Never Used  Substance and Sexual Activity  . Alcohol use: No  . Drug use: No  . Sexual activity: Not on file  Lifestyle  . Physical activity:    Days per week: Not on file    Minutes per session: Not on file  . Stress: Not on file  Relationships  . Social connections:    Talks on phone: Not on file    Gets together: Not on file    Attends religious service: Not on file    Active member of club or organization: Not on file    Attends meetings of clubs or organizations: Not on file    Relationship status: Not on file  . Intimate partner violence:    Fear of  current or ex partner: Not on file    Emotionally abused: Not on file    Physically abused: Not on file    Forced sexual activity: Not on file  Other Topics Concern  . Not on file  Social History Narrative  . Not on file    FAMILY HISTORY:  Family History  Problem Relation Age of Onset  . Cancer Father   . Cancer Brother      REVIEW OF SYSTEMS:  Constitutional: denies weight loss, fever, chills, or sweats  Eyes:  denies any other vision changes, history of eye injury  ENT: denies sore throat, hearing problems  Respiratory: denies shortness of breath, wheezing  Cardiovascular: denies chest pain, palpitations  Gastrointestinal: Positive for abdominal pain, N/V Genitourinary: denies burning with urination or urinary frequency Musculoskeletal: denies any other joint pains or cramps  Skin: denies any other rashes or skin discolorations  Neurological: denies any other headache, dizziness, weakness  Psychiatric: denies any other depression, anxiety   All other review of systems were negative   VITAL SIGNS:  Temp:  [97.8 F (36.6 C)] 97.8 F (36.6 C) (03/23 1159) Pulse Rate:  [75-119] 99 (03/23 1600) Resp:  [16-18] 16 (03/23 1600) BP: (123-169)/(66-85) 145/66 (03/23 1600) SpO2:  [98 %-100 %] 98 % (03/23 1600) Weight:  [81.6 kg] 81.6 kg (03/23 1157)     Height: 5\' 11"  (180.3 cm) Weight: 81.6 kg BMI (Calculated): 25.12   INTAKE/OUTPUT:  This shift: No intake/output data recorded.  Last 2 shifts: @IOLAST2SHIFTS @   PHYSICAL EXAM:  Constitutional:  -- Normal body habitus  -- Awake, alert, and oriented x3  Eyes:  -- Pupils equally round and reactive to light  -- No scleral icterus  Ear, nose, and throat:  -- No jugular venous distension  Pulmonary:  -- No crackles  -- Equal breath sounds bilaterally -- Breathing non-labored at rest Cardiovascular:  --Irregular rhythm Gastrointestinal:  -- Abdomen soft, mild tender, distended, no guarding or rebound tenderness --Midline  scar healing well. --Right lower quadrant ileostomy pink and patent with scant amount of stool, no air. Musculoskeletal and Integumentary:  -- Wounds or skin discoloration: None appreciated -- Extremities: B/L UE and LE FROM, hands and feet warm, no edema  Neurologic:  -- Motor function: intact and symmetric -- Sensation: intact and symmetric   Labs:  CBC Latest Ref Rng & Units 08/05/2018 06/08/2018 06/07/2018  WBC 4.0 - 10.5 K/uL 6.7 6.8 6.8  Hemoglobin 13.0 - 17.0 g/dL 13.2 11.6(L) 11.3(L)  Hematocrit 39.0 - 52.0 % 40.2 34.2(L) 33.4(L)  Platelets 150 - 400 K/uL 385 192 198   CMP Latest Ref Rng & Units 08/05/2018 06/07/2018 06/06/2018  Glucose 70 - 99 mg/dL 140(H) 132(H) 135(H)  BUN 8 - 23 mg/dL 18 13 22   Creatinine 0.61 - 1.24 mg/dL 1.10 0.71 0.89  Sodium 135 - 145 mmol/L 134(L) 139 138  Potassium 3.5 - 5.1 mmol/L 3.8 3.8 4.3  Chloride 98 - 111 mmol/L 92(L) 106 106  CO2 22 - 32 mmol/L 32 27 28  Calcium 8.9 - 10.3 mg/dL 9.5 8.5(L) 8.5(L)  Total Protein 6.5 - 8.1 g/dL 8.0 - 6.4(L)  Total Bilirubin 0.3 - 1.2 mg/dL 0.7 - 0.7  Alkaline Phos 38 - 126 U/L 79 - 61  AST 15 - 41 U/L 15 - 17  ALT 0 - 44 U/L 11 - 14    Imaging studies:  EXAM: CT ABDOMEN AND PELVIS WITH CONTRAST  TECHNIQUE: Multidetector CT imaging of the abdomen and pelvis was performed using the standard protocol following bolus administration of intravenous contrast.  CONTRAST:  178mL OMNIPAQUE IOHEXOL 300 MG/ML  SOLN  COMPARISON:  09/27/2014  FINDINGS: Lower chest: Mild chronic interstitial lung disease. No focal consolidation.  Hepatobiliary: No focal liver abnormality is seen. No gallstones, gallbladder wall thickening, or biliary dilatation.  Pancreas: Unremarkable. No pancreatic ductal dilatation or surrounding inflammatory changes.  Spleen: Normal in size without focal abnormality.  Adrenals/Urinary Tract: Normal adrenal glands. 3.2 cm hypodense, fluid attenuating left renal mass most  consistent with a cyst. Multiple  nonobstructing right renal calculi. Normal bladder.  Stomach/Bowel: No pneumatosis, pneumoperitoneum or portal venous gas. Prior total colectomy. Right lower quadrant ileostomy. Numerous small bowel air-fluid levels with dilatation measuring up to 3.4 cm with a transition point at the ostomy site as it traverses the right rectus abdominus muscle. Mild distention of the stomach which is fluid-filled.  Vascular/Lymphatic: Aortic atherosclerosis. No enlarged abdominal or pelvic lymph nodes.  Reproductive: Prostate is unremarkable.  Other: No abdominal wall hernia or abnormality. No abdominopelvic ascites.  Musculoskeletal: No acute or significant osseous findings. Lower lumbar spine spondylosis.  IMPRESSION: 1. Right lower quadrant ileostomy. Numerous small bowel air-fluid levels with dilatation measuring up to 3.4 cm with a transition point at the ostomy site as it traverses the right rectus abdominus muscle. Findings most concerning for small bowel obstruction. 2.  Aortic Atherosclerosis (ICD10-170.0)  Electronically Signed   By: Kathreen Devoid   On: 08/05/2018 14:20  Assessment/Plan:  78 y.o. male with small bowel obstruction, complicated by pertinent comorbidities including atrial flutter, sleep apnea on CPAP, hypertension, history of prostate cancer. Patient with recent total abdominal colectomy and end ileostomy (07/12/2018).  He has developed small bowel obstruction, most likely from postsurgical adhesions.  At this moment the patient does not present an acute abdomen, there is no fever, there is no leukocytosis or acidosis which are signs that the patient does not need any emergent or urgent surgical intervention.  NGT was placed at the ED, I will have abdominal x-ray to follow the placement since I was unable to have adequate drainage from the NG tube.  Patient will be admitted to the hospital for IV hydration, bowel rest, NG tube  decompression and try conservative management of small bowel obstruction.  It was discussed with patient that if the symptoms resolve surgical intervention will need to be discussed.  In his case he will be high risk due to his medical comorbidities and a surgical intervention of less than 4 weeks ago.  Since the surgery was less than 4 weeks ago it will be very risky to getting to that abdomen again, the chances of enterotomies, fistulous, abdominal abscess and recurrent obstruction are higher.  I will consult hospital for recommendation and management of medical conditions.  I encouraged the patient to get out of bed to chair and ambulate as tolerated.  Will order DVT prophylaxis.    Arnold Long, MD

## 2018-08-05 NOTE — ED Provider Notes (Addendum)
[facility] Emergency Department Provider Note  ____________________________________________  Time seen: Approximately 1:33 PM  I have reviewed the triage vital signs and the nursing notes.   HISTORY  Chief Complaint Abdominal Pain    HPI Ryan Allison is a 78 y.o. male w/ a hx of total colectomy 2/28 for diverticular bleed, prostatic metastatic malignancy, presenting for abdominal pain, nausea and vomiting.  The patient reports that for the past 3 to 4 days, he has noticed decreased output into his ostomy bag, and last night had nausea and vomiting.  He has been having a pain which he describes as "like diverticulitis pain except I will have a colon so it should not be that."  The pain is worse at night.  He has not had any fevers or chills.  Past Medical History:  Diagnosis Date  . Aortic atherosclerosis ([place])   . Arthritis   . Atrial flutter ([place])    a. diagnosed 05/2018; b. not on [place] 2/2 recurrent life threatening GI bleed; c. CHADS2VASc => 3 (age x 2, vascular disease)  . Cancer [facility]) 2011   prostate  . GI bleed   . History of echocardiogram    a. TTE 05/2018: EF of 60-65%, normal wall motion, mild MR, left atrium normal in size, RVSF normal, PASP 33 mmHg, rhythm was atrial flutter  . History of GI diverticular bleed    extended stay in the [facility] ICU due to GI bleeding prior to 2016  . HLD (hyperlipidemia)   . Hyperlipidemia   . Hypertension   . Meniscus tear   . OSA on CPAP   . Peripheral neuropathy     Patient Active Problem List   Diagnosis Date Noted  . Hematochezia   . Internal hemorrhoids   . Ulcer of anus and rectum   . Diverticulosis of large intestine without diverticulitis   . B12 deficiency 01/30/2017  . Diplopia 01/30/2017  . Diverticulosis 01/30/2017  . Hyperlipidemia 01/30/2017  . Kyphoscoliosis 01/30/2017  . Malignant neoplasm of prostate ([place]) 01/30/2017  . Osteopenia 01/30/2017  . Peripheral polyneuropathy 01/30/2017  .  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 Status post eye surgery 07/17/2016  . Acute gastrointestinal hemorrhage 05/06/2016  . Cancer of prostate ([place]) 04/03/2016  . Aortic atherosclerosis ([place]) 10/21/2015  . DDD (degenerative disc disease), lumbar 10/21/2015  . GERD (gastroesophageal reflux disease) 01/20/2014  . Esotropia 03/12/2012  . 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 Myopia of both eyes 03/12/2012  . Amblyopia of right eye 02/06/2012  . Senile nuclear sclerosis 02/06/2012    Past Surgical History:  Procedure Laterality Date  . COLONOSCOPY Left 06/07/2018   Procedure: COLONOSCOPY;  Surgeon: Virgel Manifold, MD;  Location: [facility] ENDOSCOPY;  Service: Endoscopy;  Laterality: Left;  . COLONOSCOPY WITH PROPOFOL N/A 04/06/2015   Procedure: COLONOSCOPY WITH PROPOFOL;  Surgeon: Lollie Sails, MD;  Location[facility] ENDOSCOPY;  Service: Endoscopy;  Laterality: N/A;  . COLONOSCOPY WITH PROPOFOL N/A 06/07/2018   Procedure: COLONOSCOPY WITH PROPOFOL;  Surgeon: Virgel Manifold, MD;  Location: [facility] ENDOSCOPY;  Service: Endoscopy;  Laterality: N/A;  . HERNIA REPAIR Left 09-23-14   inguinal  . INGUINAL HERNIA REPAIR Left 09/23/2014   Procedure: HERNIA REPAIR INGUINAL ADULT;  Surgeon: Christene Lye, MD;  Location: [facility] ORS;  Service: General;  Laterality: Left;  . MENISCUS REPAIR  2008  . VASCULAR SURGERY  2015    Current Outpatient Rx  . Order #: [id number] Class:  Historical Med  . Order #: [id number] Class: Historical Med  . Order #: [id number] Class: Historical Med  . Order #: [id number] Class: Historical Med  . Order #: [id number] Class: Normal  . Order #: [id number] Class: Historical Med  . Order #: [id number] Class: Historical Med  . Order #: [id number] Class: Historical Med  . Order #: [id number] Class: Historical Med  . Order #: [id number] Class: Historical Med    Allergies Nsaids; Tolmetin; Celebrex [celecoxib]; Diltiazem hcl; Other; and  Venlafaxine  Family History  Problem Relation Age of Onset  . Cancer Father   . Cancer Brother     Social History Social History   Tobacco Use  . Smoking status: Former Research scientist (life sciences)  . Smokeless tobacco: Never Used  Substance Use Topics  . Alcohol use: No  . Drug use: No    Review of Systems Constitutional: No fever/chills.  No lightheadedness or syncope. Eyes: No visual changes. ENT: No sore throat. No congestion or rhinorrhea. Cardiovascular: Denies chest pain. Denies palpitations. Respiratory: Denies shortness of breath.  No cough. Gastrointestinal: Positive decreased ostomy output and surrounding abdominal pain.  No significant distention no nausea, no vomiting.  No diarrhea.  No constipation. Genitourinary: Negative for dysuria. Musculoskeletal: Negative for back pain. Skin: Negative for rash. Neurological: Negative for headaches. No focal numbness, tingling or weakness.     ____________________________________________   PHYSICAL EXAM:  VITAL SIGNS: ED Triage Vitals  Enc Vitals Group     BP 08/05/18 1159 (!) 145/82     Pulse Rate 08/05/18 1159 (!) 119     Resp 08/05/18 1159 18     Temp 08/05/18 1159 97.8 F (36.6 C)     Temp Source 08/05/18 1159 Oral     SpO2 08/05/18 1159 99 %     Weight 08/05/18 1157 180 lb (81.6 kg)     Height 08/05/18 1157 5\' 11"  (1.803 m)     Head Circumference --      Peak Flow --      Pain Score 08/05/18 1157 6     Pain Loc --      Pain Edu? --      Excl. in [place]? --     Constitutional: Alert and oriented. Answers questions appropriately.  Chronically ill-appearing. Eyes: Conjunctivae are normal.  EOMI. No scleral icterus. Head: Atraumatic. Nose: No congestion/rhinnorhea. Mouth/Throat: Mucous membranes are dry.  Neck: No stridor.  Supple.   Cardiovascular: Normal rate, regular rhythm. No murmurs, rubs or gallops.  Respiratory: Normal respiratory effort.  No accessory muscle use or retractions. Lungs CTAB.  No wheezes, rales or  ronchi. Gastrointestinal: Soft, and nondistended.  No specific tenderness to palpation on my.  No guarding or rebound.  No peritoneal signs. Musculoskeletal: No LE edema. No ttp in the calves or palpable cords.  Negative Homan's sign. Neurologic:  A&Ox3.  Speech is clear.  Face and smile are symmetric.  EOMI.  Moves all extremities well. Skin:  Skin is warm, dry and intact. No rash noted. Psychiatric: Mood and affect are normal. Speech and behavior are normal.  Normal judgement.  ____________________________________________   LABS (all labs ordered are listed, but only abnormal results are displayed)  Labs Reviewed  COMPREHENSIVE METABOLIC PANEL - Abnormal; Notable for the following components:      Result Value   Sodium 134 (*)    Chloride 92 (*)    Glucose, Bld 140 (*)    All other components within normal limits  CBC - Abnormal; Notable for the following components:   RBC 4.17 (*)  All other components within normal limits  LIPASE, BLOOD  URINALYSIS, COMPLETE (UACMP) WITH MICROSCOPIC   ____________________________________________  EKG  Not indicated ____________________________________________  RADIOLOGY  Ct Abdomen Pelvis W Contrast  Result Date: 08/05/2018 CLINICAL DATA:  Abdominal pain, vomiting since last night, no fever EXAM: CT ABDOMEN AND PELVIS WITH CONTRAST TECHNIQUE: Multidetector CT imaging of the abdomen and pelvis was performed using the standard protocol following bolus administration of intravenous contrast. CONTRAST:  144mL OMNIPAQUE IOHEXOL 300 MG/ML  SOLN COMPARISON:  09/27/2014 FINDINGS: Lower chest: Mild chronic interstitial lung disease. No focal consolidation. Hepatobiliary: No focal liver abnormality is seen. No gallstones, gallbladder wall thickening, or biliary dilatation. Pancreas: Unremarkable. No pancreatic ductal dilatation or surrounding inflammatory changes. Spleen: Normal in size without focal abnormality. Adrenals/Urinary Tract: Normal adrenal  glands. 3.2 cm hypodense, fluid attenuating left renal mass most consistent with a cyst. Multiple nonobstructing right renal calculi. Normal bladder. Stomach/Bowel: No pneumatosis, pneumoperitoneum or portal venous gas. Prior total colectomy. Right lower quadrant ileostomy. Numerous small bowel air-fluid levels with dilatation measuring up to 3.4 cm with a transition point at the ostomy site as it traverses the right rectus abdominus muscle. Mild distention of the stomach which is fluid-filled. Vascular/Lymphatic: Aortic atherosclerosis. No enlarged abdominal or pelvic lymph nodes. Reproductive: Prostate is unremarkable. Other: No abdominal wall hernia or abnormality. No abdominopelvic ascites. Musculoskeletal: No acute or significant osseous findings. Lower lumbar spine spondylosis. IMPRESSION: 1. Right lower quadrant ileostomy. Numerous small bowel air-fluid levels with dilatation measuring up to 3.4 cm with a transition point at the ostomy site as it traverses the right rectus abdominus muscle. Findings most concerning for small bowel obstruction. 2.  Aortic Atherosclerosis (ICD10-170.0) Electronically Signed   By: Kathreen Devoid   On: 08/05/2018 14:20    ____________________________________________   PROCEDURES  Procedure(s) performed: None  Procedures  Critical Care performed: No ____________________________________________   INITIAL IMPRESSION / ASSESSMENT AND PLAN / ED COURSE  Pertinent labs & imaging results that were available during my care of the patient were reviewed by me and considered in my medical decision making (see chart for details).  78 y.o. male with a history of colectomy and ostomy with decreased ostomy output, abdominal pain, nausea and vomiting.  Overall, I am concerned about dehydration as the patient is tachycardic and has dry mucous membranes.  I am concerned he may have obstruction or a complication with his ostomy.  A CT scan has been ordered.  The patient will  receive intravenous fluids, an antiemetic and pain medication.  Plan reevaluation for final disposition.  ----------------------------------------- 2:39 PM on 08/05/2018 -----------------------------------------  Patient CT scan is consistent with small bowel obstruction.  I have asked the nurse to place an NG tube.  I have had an extensive discussion with the patient about transfer to [facility] versus admission here and he prefers to stay at this hospital.  I will speak to the general surgeon as well as the hospitalist for admission.  ----------------------------------------- 3:12 PM on 08/05/2018 -----------------------------------------  I have spoken to the patient's son, who is up-to-date on the CT findings, as well as the plan of care.  I have also spoken to Dr. Peyton Najjar, the general surgeon on-call who will follow the patient.  ____________________________________________  FINAL CLINICAL IMPRESSION(S) / ED DIAGNOSES  Final diagnoses:  Small bowel obstruction ([place])         NEW MEDICATIONS STARTED DURING THIS VISIT:  New Prescriptions   No medications on file      Eula Listen, MD  08/05/18 1440    Eula Listen, MD 08/05/18 1513

## 2018-08-05 NOTE — Progress Notes (Signed)
Patient admitted to 34. He is awake, alert and oriented. He is able to move all 4 ext. His vitals are stable. States his pain is 9/10 to his abdomin. Will administer pain medication per order and continue to monitor.  Patient family called his cell phone and were updated. Patient oriented to call light, nursing staff phone numbers and tv remote.

## 2018-08-05 NOTE — ED Triage Notes (Signed)
Pt had part of colon removed at [facility] 3 weeks ago. Having pain across lower abdomen.  Pt sent for possible CT. Pt had vomiting last night with pain. S/p ileostomy from [facility].  Denies fever.

## 2018-08-05 NOTE — ED Notes (Signed)
Attempted to call son to give update on pt.  No answer message left to return call.

## 2018-08-06 ENCOUNTER — Inpatient Hospital Stay: Payer: Medicare Other

## 2018-08-06 LAB — BASIC METABOLIC PANEL
Anion gap: 8 (ref 5–15)
BUN: 16 mg/dL (ref 8–23)
CO2: 28 mmol/L (ref 22–32)
Calcium: 8.7 mg/dL — ABNORMAL LOW (ref 8.9–10.3)
Chloride: 100 mmol/L (ref 98–111)
Creatinine, Ser: 0.89 mg/dL (ref 0.61–1.24)
GFR calc Af Amer: >60 mL/min (ref >60)
GFR calc non Af Amer: >60 mL/min (ref >60)
Glucose, Bld: 106 mg/dL — ABNORMAL HIGH (ref 70–99)
Potassium: 3.7 mmol/L (ref 3.5–5.1)
Sodium: 136 mmol/L (ref 135–145)

## 2018-08-06 LAB — CBC
HCT: 35.4 % — ABNORMAL LOW (ref 39.0–52.0)
Hemoglobin: 11.4 g/dL — ABNORMAL LOW (ref 13.0–17.0)
MCH: 31.9 pg (ref 26.0–34.0)
MCHC: 32.2 g/dL (ref 30.0–36.0)
MCV: 99.2 fL (ref 80.0–100.0)
Platelets: 327 10*3/uL (ref 150–400)
RBC: 3.57 MIL/uL — ABNORMAL LOW (ref 4.22–5.81)
RDW: 12.7 % (ref 11.5–15.5)
WBC: 5.1 10*3/uL (ref 4.0–10.5)
nRBC: 0 % (ref 0.0–0.2)

## 2018-08-06 MED ORDER — KETOROLAC TROMETHAMINE 30 MG/ML IJ SOLN
30.0000 mg | Freq: Three times a day (TID) | INTRAMUSCULAR | Status: DC | PRN
  Administered 2018-08-06: 30 mg via INTRAVENOUS
  Filled 2018-08-06 (×2): qty 1

## 2018-08-06 MED ORDER — MORPHINE SULFATE (PF) 2 MG/ML IV SOLN
1.0000 mg | Freq: Once | INTRAVENOUS | Status: AC
  Administered 2018-08-06: 1 mg via INTRAVENOUS
  Filled 2018-08-06: qty 1

## 2018-08-06 MED ORDER — DIATRIZOATE MEGLUMINE & SODIUM 66-10 % PO SOLN
90.0000 mL | Freq: Once | ORAL | Status: AC
  Administered 2018-08-06: 90 mL via NASOGASTRIC

## 2018-08-06 NOTE — Progress Notes (Addendum)
Advanced Care Plan.  Purpose of Encounter: CODE STATUS. Parties in Attendance: The patient and me. Patient's Decisional Capacity: Yes. Medical Story: Ryan Allison  is a 78 y.o. male with a known history of essential hypertension, hyperlipidemia, obstructive sleep apnea, neuropathy, history of prostate cancer, previous history of GI bleed, atrial flutter.  He is admitted for small bowel obstruction.  I discussed with the patient about his current condition, prognosis and CODE STATUS.  The patient stated that he wants to be resuscitated and intubated if he has cardiopulmonary arrest. Plan:  Code Status: Full code. Time spent discussing advance care planning: 17 minutes.

## 2018-08-06 NOTE — Progress Notes (Addendum)
Patient with NPO orders but NGT is clamped. Patient preferred to attempt his p.o am medications. Was able to tolerate p.o pain medication and his scheduled BP med but had difficulty swallowing and prefers to hold off on the rest of the am medications. Patient has IV pain and IV BP meds available as needed.   Update 17:30- patient complains that the morphine makes his stomach cramp worse. MD has dc morphine orders and placed Toradol. Orders also in place to clamp NGT over night. Per MD ok to give p.o medications with clamped tube.

## 2018-08-06 NOTE — Plan of Care (Signed)
  Problem: Clinical Measurements: Goal: Ability to maintain clinical measurements within normal limits will improve Outcome: Progressing Goal: Will remain free from infection Outcome: Progressing Goal: Diagnostic test results will improve Outcome: Progressing Goal: Respiratory complications will improve Outcome: Progressing Goal: Cardiovascular complication will be avoided Outcome: Progressing   Problem: Nutrition: Goal: Adequate nutrition will be maintained Outcome: Progressing   Problem: Elimination: Goal: Will not experience complications related to bowel motility Outcome: Progressing Goal: Will not experience complications related to urinary retention Outcome: Progressing  Patient with increasing stoo and flatus output noted in ostomy bag.

## 2018-08-06 NOTE — Progress Notes (Signed)
[facility] Day(s): 1.   Post op day(s):  Marland Kitchen   Interval History: Patient seen and examined, no acute events or new complaints overnight. Patient reports having significant discomfort due to the NG tube in his nose.  Reports that the abdominal pain crampings are decreasing but still uncomfortable.  Denies vomiting after NG tube was placed denies fever chills.  Still does not feel in good mood and very uncomfortable.   Vital signs in last 24 hours: [min-max] current  Temp:  [97.8 F (36.6 C)-98.4 F (36.9 C)] 98.3 F (36.8 C) (03/24 0558) Pulse Rate:  [75-119] 81 (03/24 0558) Resp:  [14-18] 16 (03/24 0558) BP: (123-169)/(66-117) 145/70 (03/24 0558) SpO2:  [97 %-100 %] 100 % (03/24 0558) Weight:  [81.6 kg] 81.6 kg (03/23 1745)     Height: 5\' 11"  (180.3 cm) Weight: 81.6 kg BMI (Calculated): 25.1   NGT: 70 mL in 24 hours  Physical Exam:  Constitutional: alert, cooperative and no distress  Respiratory: breathing non-labored at rest  Cardiovascular: regular rate and sinus rhythm  Gastrointestinal: soft, non-tender, and mild distended. The ostomy is pink and patent.  Scant amount of stool around the ostomy, pasty.  Minimal air in bag  Labs:  CBC Latest Ref Rng & Units 08/06/2018 08/05/2018 06/08/2018  WBC 4.0 - 10.5 K/uL 5.1 6.7 6.8  Hemoglobin 13.0 - 17.0 g/dL 11.4(L) 13.2 11.6(L)  Hematocrit 39.0 - 52.0 % 35.4(L) 40.2 34.2(L)  Platelets 150 - 400 K/uL 327 385 192   CMP Latest Ref Rng & Units 08/06/2018 08/05/2018 06/07/2018  Glucose 70 - 99 mg/dL 106(H) 140(H) 132(H)  BUN 8 - 23 mg/dL 16 18 13   Creatinine 0.61 - 1.24 mg/dL 0.89 1.10 0.71  Sodium 135 - 145 mmol/L 136 134(L) 139  Potassium 3.5 - 5.1 mmol/L 3.7 3.8 3.8  Chloride 98 - 111 mmol/L 100 92(L) 106  CO2 22 - 32 mmol/L 28 32 27  Calcium 8.9 - 10.3 mg/dL 8.7(L) 9.5 8.5(L)  Total Protein 6.5 - 8.1 g/dL - 8.0 -  Total Bilirubin 0.3 - 1.2 mg/dL - 0.7 -  Alkaline Phos 38 - 126 U/L - 79 -  AST 15 - 41 U/L -  15 -  ALT 0 - 44 U/L - 11 -    Imaging studies: I personally reviewed the abdominal x-ray done yesterday after admission.  There is no bowel dilation.  Gastric tube in place.  No free air.   Assessment/Plan:  78 y.o. male with small bowel obstruction, complicated by pertinent comorbidities including atrial flutter, sleep apnea on CPAP, hypertension, history of prostate cancer. Patient without significant change in his clinical status today.  The amount of NGT drainage was not significant for the distention of the bowel seen in the abdominal x-ray yesterday.  The abdomen does look a little bit less distended and less painful than yesterday.  There was some gas in the bag with a scant amount of stool.  I am going to order a Gastrografin challenge for evaluation of small bowel transit point of obstruction.  We will continue with IV hydration.  There is no significant electrolyte disturbance.  There is no leukocytosis.  There is no fever.  At this point there is no indication for urgent surgical management and will try to continue conservative management.  Patient was encouraged to ambulate. We will follow hospitalist recommendation regarding medical management such as hypertension, atrial flutter and sleep apnea. Their input and recommendation appreciated.  Arnold Long, MD

## 2018-08-06 NOTE — Progress Notes (Signed)
[facility] at [place] NAME: Anders Hohmann    MR#:  [id number]  DATE OF BIRTH:  Apr 06, 76 1942  SUBJECTIVE:  CHIEF COMPLAINT:   Chief Complaint  Patient presents with  . Abdominal Pain   The patient has better abdominal pain.  He has stool in the colectomy bag. REVIEW OF SYSTEMS:  Review of Systems  Constitutional: Negative for chills, fever and malaise/fatigue.  HENT: Negative for sore throat.   Eyes: Negative for blurred vision and double vision.  Respiratory: Negative for cough, hemoptysis, shortness of breath, wheezing and stridor.   Cardiovascular: Negative for chest pain, palpitations, orthopnea and leg swelling.  Gastrointestinal: Positive for abdominal pain. Negative for blood in stool, diarrhea, melena, nausea and vomiting.  Genitourinary: Negative for dysuria, flank pain and hematuria.  Musculoskeletal: Negative for back pain and joint pain.  Skin: Negative for rash.  Neurological: Negative for dizziness, sensory change, focal weakness, seizures, loss of consciousness, weakness and headaches.  Endo/Heme/Allergies: Negative for polydipsia.  Psychiatric/Behavioral: Negative for depression. The patient is not nervous/anxious.     DRUG ALLERGIES:   Allergies  Allergen Reactions  . Nsaids Other (See Comments)    Diverticular bleed Diverticular bleed  . Tolmetin     Other reaction(s): Other (See Comments) Diverticular bleed Diverticular bleed  . Celebrex [Celecoxib] Itching and Rash  . Diltiazem Hcl Rash  . Other Other (See Comments)    Diverticular bleed  . Venlafaxine Other (See Comments)    sedation sedation   VITALS:  Blood pressure (!) 145/70, pulse 81, temperature 98.3 F (36.8 C), temperature source Oral, resp. rate 16, height 5\' 11"  (1.803 m), weight 81.6 kg, SpO2 100 %. PHYSICAL EXAMINATION:  Physical Exam HENT:     Head: Normocephalic.     Mouth/Throat:     Mouth: Mucous membranes are moist.  Eyes:   General: No scleral icterus.    Conjunctiva/sclera: Conjunctivae normal.     Pupils: Pupils are equal, round, and reactive to light.  Neck:     Musculoskeletal: Normal range of motion and neck supple.     Vascular: No JVD.     Trachea: No tracheal deviation.  Cardiovascular:     Rate and Rhythm: Normal rate and regular rhythm.     Heart sounds: Normal heart sounds. No murmur. No gallop.   Pulmonary:     Effort: Pulmonary effort is normal. No respiratory distress.     Breath sounds: Normal breath sounds. No wheezing or rales.  Abdominal:     General: Bowel sounds are normal. There is no distension.     Palpations: Abdomen is soft.     Tenderness: There is no abdominal tenderness. There is no rebound.     Comments: Colectomy bag in situ.  Musculoskeletal: Normal range of motion.        General: No tenderness.     Right lower leg: No edema.     Left lower leg: No edema.  Skin:    Findings: No erythema or rash.  Neurological:     General: No focal deficit present.     Mental Status: He is alert and oriented to person, place, and time.     Cranial Nerves: No cranial nerve deficit.  Psychiatric:        Mood and Affect: Mood normal.    LABORATORY PANEL:  Male CBC Recent Labs  Lab 08/06/18 0456  WBC 5.1  HGB 11.4*  HCT 35.4*  PLT 327   ------------------------------------------------------------------------------------------------------------------ Chemistries  Recent Labs  Lab 08/05/18 1244 08/06/18 0456  NA 134* 136  K 3.8 3.7  CL 92* 100  CO2 32 28  GLUCOSE 140* 106*  BUN 18 16  CREATININE 1.10 0.89  CALCIUM 9.5 8.7*  AST 15  --   ALT 11  --   ALKPHOS 79  --   BILITOT 0.7  --    RADIOLOGY:  Ct Abdomen Pelvis W Contrast  Result Date: 08/05/2018 CLINICAL DATA:  Abdominal pain, vomiting since last night, no fever EXAM: CT ABDOMEN AND PELVIS WITH CONTRAST TECHNIQUE: Multidetector CT imaging of the abdomen and pelvis was performed using the standard protocol  following bolus administration of intravenous contrast. CONTRAST:  185mL OMNIPAQUE IOHEXOL 300 MG/ML  SOLN COMPARISON:  09/27/2014 FINDINGS: Lower chest: Mild chronic interstitial lung disease. No focal consolidation. Hepatobiliary: No focal liver abnormality is seen. No gallstones, gallbladder wall thickening, or biliary dilatation. Pancreas: Unremarkable. No pancreatic ductal dilatation or surrounding inflammatory changes. Spleen: Normal in size without focal abnormality. Adrenals/Urinary Tract: Normal adrenal glands. 3.2 cm hypodense, fluid attenuating left renal mass most consistent with a cyst. Multiple nonobstructing right renal calculi. Normal bladder. Stomach/Bowel: No pneumatosis, pneumoperitoneum or portal venous gas. Prior total colectomy. Right lower quadrant ileostomy. Numerous small bowel air-fluid levels with dilatation measuring up to 3.4 cm with a transition point at the ostomy site as it traverses the right rectus abdominus muscle. Mild distention of the stomach which is fluid-filled. Vascular/Lymphatic: Aortic atherosclerosis. No enlarged abdominal or pelvic lymph nodes. Reproductive: Prostate is unremarkable. Other: No abdominal wall hernia or abnormality. No abdominopelvic ascites. Musculoskeletal: No acute or significant osseous findings. Lower lumbar spine spondylosis. IMPRESSION: 1. Right lower quadrant ileostomy. Numerous small bowel air-fluid levels with dilatation measuring up to 3.4 cm with a transition point at the ostomy site as it traverses the right rectus abdominus muscle. Findings most concerning for small bowel obstruction. 2.  Aortic Atherosclerosis (ICD10-170.0) Electronically Signed   By: Kathreen Devoid   On: 08/05/2018 14:20   Dg Chest Portable 1 View  Result Date: 08/05/2018 CLINICAL DATA:  Vomiting last night. Status post colon removal 3 weeks ago. Status post ileostomy. NG tube placement. EXAM: PORTABLE CHEST 1 VIEW COMPARISON:  None. FINDINGS: The heart, hila, and  mediastinum are normal. No pneumothorax. No nodules or masses. Mild bibasilar atelectasis. No suspicious infiltrates. An NG tube is been placed. The distal tip terminates in the abdomen, below this film. IMPRESSION: 1. The NG tube terminates below the left hemidiaphragm. The distal tip terminates below today's film and is not visualized. Electronically Signed   By: Dorise Bullion III M.D   On: 08/05/2018 16:02   Dg Abd 2 Views  Result Date: 08/05/2018 CLINICAL DATA:  Small-bowel obstruction identified on CT earlier in this patient who presented with acute onset of abdominal pain and vomiting that began last night. Nasogastric tube placement. EXAM: ABDOMEN - 2 VIEW COMPARISON:  CT abdomen and pelvis earlier same day and previously. FINDINGS: Nasogastric tube tip in the gastric fundus. Multiple dilated loops of small bowel throughout the abdomen as noted on the earlier CT. No evidence of free intraperitoneal air on this ERECT image. IMPRESSION: 1. Nasogastric tube tip in the gastric fundus. 2. Small bowel obstruction.  No free intraperitoneal air. Electronically Signed   By: Evangeline Dakin M.D.   On: 08/05/2018 17:10   Dg Abd Portable 1v-small Bowel Protocol-position Verification  Result Date: 08/06/2018 CLINICAL DATA:  NG tube placement EXAM: PORTABLE ABDOMEN - 1 VIEW COMPARISON:  08/05/2018 FINDINGS: Enteric tube terminates in the gastric cardia with side port at the GE junction. Mildly dilated loops of small bowel in the right mid abdomen. Mild degenerative changes of the lumbar spine. IMPRESSION: Enteric tube terminates in the gastric cardia with side port at the GE junction. Electronically Signed   By: Julian Hy M.D.   On: 08/06/2018 08:45   ASSESSMENT AND PLAN:   78 year old male with past medical history of essential hypertension, hyperlipidemia, obstructive sleep apnea, neuropathy, history of prostate cancer, previous history of GI bleed, atrial flutter who presents to the hospital due  to abdominal pain.  1.  Small bowel obstruction-this is a cause of patient abdominal pain nausea vomiting.  Patient CT scan of the abdomen pelvis concerning for this.  Continue n.p.o., supportive care with NG tube decompression, IV fluids, antiemetics and pain control.   Follow-up surgeon's recommendation.  2.  Essential hypertension- blood pressures currently stable.  Patient cannot take p.o. due to SBO.  Hold metoprolol. Continue IV hydralazine as needed.  Resume home medication after starting diet.  3.  GERD-continue IV Protonix.  4.  Hyperlipidemia-we will resume Zocor when patient can take p.o.  5.  History of prostate cancer/BPH-no evidence of urinary retention presently.  Will resume patient's Casodex and Flomax when appropriate.  All the records are reviewed and case discussed with Care Management/Social Worker. Management plans discussed with the patient, family and they are in agreement.  CODE STATUS: Full Code  TOTAL TIME TAKING CARE OF THIS PATIENT: 32 minutes.   More than 50% of the time was spent in counseling/coordination of care: YES  POSSIBLE D/C IN 2 DAYS, DEPENDING ON CLINICAL CONDITION.   Demetrios Loll M.D on 08/06/2018 at 10:50 AM  Between 7am to 6pm - Pager - [phone]  After 6pm go to [email or web address] - [facility]

## 2018-08-06 NOTE — Progress Notes (Signed)
Initial Nutrition Assessment  DOCUMENTATION CODES:   Not applicable  INTERVENTION:   RD will monitor for diet advancement vs the need for nutrition support  NUTRITION DIAGNOSIS:   Unintentional weight loss related to acute illness as evidenced by 9 percent weight loss in 1 month  GOAL:   Patient will meet greater than or equal to 90% of their needs  MONITOR:   Diet advancement, Labs, Weight trends, Skin, I & O's  REASON FOR ASSESSMENT:   Malnutrition Screening Tool    ASSESSMENT:   78 y.o. 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 Pt reports intermittent abdominal pain for 1 week pta. Pt reports nausea and vomiting that started one day pta. Pt s/p total colectomy on 2/28 for diverticular bleed. Per chart, pt has lost 18lbs(9%) over the past month; this is significant. Pt's UBW appears to be ~200lbs. Pt NPO with NGT in place and 61ml output x 24 hrs. NGT clamped today. Per MD note, medical management for now. RD will monitor for diet advancement vs the need for nutrition support. Plan is for gastrografin challenge for evaluation of small bowel transit point of obstruction today.   Medications reviewed and include: lovenox, NaCl @75ml /hr, pepcid  Labs reviewed:  Unable to complete Nutrition-Focused physical exam at this time.   Diet Order:   Diet Order            Diet NPO time specified  Diet effective now             EDUCATION NEEDS:   No education needs have been identified at this time  Skin:  Skin Assessment: Reviewed RN Assessment(closed incision abdomen )  Last BM:  3/23- Type 6  Height:   Ht Readings from Last 1 Encounters:  08/05/18 5\' 11"  (1.803 m)    Weight:   Wt Readings from Last 1 Encounters:  08/05/18 81.6 kg    Ideal Body Weight:  78.1 kg  BMI:  Body mass index is 25.09  kg/m.  Estimated Nutritional Needs:   Kcal:  1900-2200kcal/day  Protein:  90-106g/day   Fluid:  >1.9L/day   Koleen Distance MS, RD, LDN Pager #- [phone] Office#- [phone] After Hours Pager: [phone]

## 2018-08-07 ENCOUNTER — Inpatient Hospital Stay: Payer: Medicare Other

## 2018-08-07 MED ORDER — MORPHINE SULFATE (PF) 2 MG/ML IV SOLN
2.0000 mg | Freq: Once | INTRAVENOUS | Status: AC
  Administered 2018-08-07: 2 mg via INTRAVENOUS
  Filled 2018-08-07: qty 1

## 2018-08-07 MED ORDER — HYDROMORPHONE HCL 1 MG/ML IJ SOLN
1.0000 mg | Freq: Four times a day (QID) | INTRAMUSCULAR | Status: DC | PRN
  Administered 2018-08-07 – 2018-08-09 (×7): 1 mg via INTRAVENOUS
  Filled 2018-08-07 (×7): qty 1

## 2018-08-07 NOTE — Progress Notes (Signed)
[facility] at [place] NAME: Ryan Allison    MR#:  [id number]  DATE OF BIRTH:  08/21/1940  SUBJECTIVE:  CHIEF COMPLAINT:   Chief Complaint  Patient presents with  . Abdominal Pain   The patient has no abdominal pain.  He has stool in the colectomy bag. REVIEW OF SYSTEMS:  Review of Systems  Constitutional: Negative for chills, fever and malaise/fatigue.  HENT: Negative for sore throat.   Eyes: Negative for blurred vision and double vision.  Respiratory: Negative for cough, hemoptysis, shortness of breath, wheezing and stridor.   Cardiovascular: Negative for chest pain, palpitations, orthopnea and leg swelling.  Gastrointestinal: Negative for abdominal pain, blood in stool, diarrhea, melena, nausea and vomiting.  Genitourinary: Negative for dysuria, flank pain and hematuria.  Musculoskeletal: Negative for back pain and joint pain.  Skin: Negative for rash.  Neurological: Negative for dizziness, sensory change, focal weakness, seizures, loss of consciousness, weakness and headaches.  Endo/Heme/Allergies: Negative for polydipsia.  Psychiatric/Behavioral: Negative for depression. The patient is not nervous/anxious.     DRUG ALLERGIES:   Allergies  Allergen Reactions  . Nsaids Other (See Comments)    Diverticular bleed Diverticular bleed  . Tolmetin     Other reaction(s): Other (See Comments) Diverticular bleed Diverticular bleed  . Celebrex [Celecoxib] Itching and Rash  . Diltiazem Hcl Rash  . Other Other (See Comments)    Diverticular bleed  . Venlafaxine Other (See Comments)    sedation sedation   VITALS:  Blood pressure (!) 158/81, pulse 91, temperature (!) 97.5 F (36.4 C), temperature source Oral, resp. rate 18, height 5\' 11"  (1.803 m), weight 81.6 kg, SpO2 98 %. PHYSICAL EXAMINATION:  Physical Exam HENT:     Head: Normocephalic.     Mouth/Throat:     Mouth: Mucous membranes are moist.  Eyes:     General: No  scleral icterus.    Conjunctiva/sclera: Conjunctivae normal.     Pupils: Pupils are equal, round, and reactive to light.  Neck:     Musculoskeletal: Normal range of motion and neck supple.     Vascular: No JVD.     Trachea: No tracheal deviation.  Cardiovascular:     Rate and Rhythm: Normal rate and regular rhythm.     Heart sounds: Normal heart sounds. No murmur. No gallop.   Pulmonary:     Effort: Pulmonary effort is normal. No respiratory distress.     Breath sounds: Normal breath sounds. No wheezing or rales.  Abdominal:     General: Bowel sounds are normal. There is no distension.     Palpations: Abdomen is soft.     Tenderness: There is abdominal tenderness. There is no rebound.     Comments: Colectomy bag in situ.  Musculoskeletal: Normal range of motion.        General: No tenderness.     Right lower leg: No edema.     Left lower leg: No edema.  Skin:    Findings: No erythema or rash.  Neurological:     General: No focal deficit present.     Mental Status: He is alert and oriented to person, place, and time.     Cranial Nerves: No cranial nerve deficit.  Psychiatric:        Mood and Affect: Mood normal.    LABORATORY PANEL:  Male CBC Recent Labs  Lab 08/06/18 0456  WBC 5.1  HGB 11.4*  HCT 35.4*  PLT 327   ------------------------------------------------------------------------------------------------------------------  Chemistries  Recent Labs  Lab 08/05/18 1244 08/06/18 0456  NA 134* 136  K 3.8 3.7  CL 92* 100  CO2 32 28  GLUCOSE 140* 106*  BUN 18 16  CREATININE 1.10 0.89  CALCIUM 9.5 8.7*  AST 15  --   ALT 11  --   ALKPHOS 79  --   BILITOT 0.7  --    RADIOLOGY:  Dg Abd 2 Views  Result Date: 08/07/2018 CLINICAL DATA:  Suspected small-bowel obstruction EXAM: ABDOMEN - 2 VIEW COMPARISON:  08/06/2018; 08/05/2018; CT abdomen pelvis-08/05/2018 FINDINGS: While the majority ingested enteric contrast appears to have been excreted, there is a small  amount contrast seen within several loops distal small bowel with associated moderate gaseous distension of several upstream loops of small bowel with index loop within the right mid abdomen measuring approximately 4.2 cm in diameter, previously, 4.6 cm. Overall bowel gas pattern is minimally improved compared to the 08/06/2018 examination with decreased gas distention of upstream loops of small bowel. Enteric tube side port overlies the gastroesophageal junction. An ostomy overlies the right mid abdomen. Limited visualization of the lower thorax demonstrates minimal bibasilar opacities, potentially accentuated due to technique. Degenerative change lower lumbar spine. IMPRESSION: Suspected minimal improvement in partial small bowel obstruction. Electronically Signed   By: Sandi Mariscal M.D.   On: 08/07/2018 10:28   Dg Abd Portable 1v-small Bowel Obstruction Protocol-initial, 8 Hr Delay  Result Date: 08/06/2018 CLINICAL DATA:  Small bowel obstruction. EXAM: PORTABLE ABDOMEN - 1 VIEW COMPARISON:  Radiographs of same day. FINDINGS: Stable small bowel dilatation is noted. Contrast is seen throughout the small bowel, but no definite colonic contrast is noted. No colonic dilatation is noted. Contrast is noted in the urinary bladder. IMPRESSION: Stable small bowel dilatation is noted concerning for distal small bowel obstruction. Ostomy is noted in right lower quadrant. No colonic dilatation is noted. No definite contrast is noted in the colon. Electronically Signed   By: Marijo Conception, M.D.   On: 08/06/2018 19:56   ASSESSMENT AND PLAN:   78 year old male with past medical history of essential hypertension, hyperlipidemia, obstructive sleep apnea, neuropathy, history of prostate cancer, previous history of GI bleed, atrial flutter who presents to the hospital due to abdominal pain.  1.  Small bowel obstruction-this is a cause of patient abdominal pain nausea vomiting.  Patient CT scan of the abdomen pelvis  concerning for this.  Continue n.p.o., supportive care with NG tube decompression, IV fluids, antiemetics and pain control.   Follow-up surgeon's recommendation.  2.  Essential hypertension- blood pressures currently stable.  Patient cannot take p.o. due to SBO.  Hold metoprolol. Continue IV hydralazine as needed.  Resume home medication after starting diet.  3.  GERD-continue IV Protonix.  4.  Hyperlipidemia-we will resume Zocor when patient can take p.o.  5.  History of prostate cancer/BPH-no evidence of urinary retention presently.  Will resume patient's Casodex and Flomax when appropriate.  I discussed with Dr. Windell Moment. All the records are reviewed and case discussed with Care Management/Social Worker. Management plans discussed with the patient, family and they are in agreement.  CODE STATUS: Full Code  TOTAL TIME TAKING CARE OF THIS PATIENT: 26 minutes.   More than 50% of the time was spent in counseling/coordination of care: YES  POSSIBLE D/C IN 2 DAYS, DEPENDING ON CLINICAL CONDITION.   Demetrios Loll M.D on 08/07/2018 at 11:20 AM  Between 7am to 6pm - Pager - [phone]  After 6pm go to [email or web address] -  [facility]

## 2018-08-07 NOTE — Progress Notes (Signed)
[place] Hospital Day(s): 2.   Post op day(s):  Marland Kitchen   Interval History: Patient seen and examined, no acute events or new complaints overnight. Patient reports there has been emerging using his clinical status.  He still complained of intermittent cramping abdominal pain and also complained of significant discomfort from the NGT.  Denies fever or chills.  Denies nausea or vomiting.  Vital signs in last 24 hours: [min-max] current  Temp:  [97.5 F (36.4 C)-98.8 F (37.1 C)] 97.5 F (36.4 C) (03/25 0440) Pulse Rate:  [81-102] 91 (03/25 0621) Resp:  [16-18] 18 (03/25 0621) BP: (137-158)/(69-93) 158/81 (03/25 0621) SpO2:  [97 %-100 %] 98 % (03/25 0621)     Height: 5\' 11"  (180.3 cm) Weight: 81.6 kg BMI (Calculated): 25.1   Ostomy: 1550 mL in 24 hours.   Physical Exam:  Constitutional: alert, cooperative and no distress  Respiratory: breathing non-labored at rest  Cardiovascular: regular rate and sinus rhythm  Gastrointestinal: soft, non-tender, and mild distended.  Ostomy pink and patent with some stool on the back.  Labs:  CBC Latest Ref Rng & Units 08/06/2018 08/05/2018 06/08/2018  WBC 4.0 - 10.5 K/uL 5.1 6.7 6.8  Hemoglobin 13.0 - 17.0 g/dL 11.4(L) 13.2 11.6(L)  Hematocrit 39.0 - 52.0 % 35.4(L) 40.2 34.2(L)  Platelets 150 - 400 K/uL 327 385 192   CMP Latest Ref Rng & Units 08/06/2018 08/05/2018 06/07/2018  Glucose 70 - 99 mg/dL 106(H) 140(H) 132(H)  BUN 8 - 23 mg/dL 16 18 13   Creatinine 0.61 - 1.24 mg/dL 0.89 1.10 0.71  Sodium 135 - 145 mmol/L 136 134(L) 139  Potassium 3.5 - 5.1 mmol/L 3.7 3.8 3.8  Chloride 98 - 111 mmol/L 100 92(L) 106  CO2 22 - 32 mmol/L 28 32 27  Calcium 8.9 - 10.3 mg/dL 8.7(L) 9.5 8.5(L)  Total Protein 6.5 - 8.1 g/dL - 8.0 -  Total Bilirubin 0.3 - 1.2 mg/dL - 0.7 -  Alkaline Phos 38 - 126 U/L - 79 -  AST 15 - 41 U/L - 15 -  ALT 0 - 44 U/L - 11 -    Imaging studies: Abdominal x-ray done yesterday.  Personally evaluated the images.   Persistent small bowel dilation throughout the abdomen.  There is better visualization of the rest of the small bowel.  No appointment transition at this time.   Assessment/Plan:  78 y.o.malewith small bowel obstruction, complicated by pertinent comorbidities includingatrial flutter,sleep apnea on CPAP,hypertension,history of prostate cancer. Patient is having more bowel movement with a significant amount of stool in the bag but still with the same cramping abdominal pain as before.  I considered that there is a component of intermittent ileus versus partial small bowel obstruction.  I am being very cautious of removing the NG tube at this moment since there is no significant improvement of bowel dilation in the x-ray.  I am afraid that if I take out the NGT and the ileus recurs or the bowel ulceration recurs he will need the NG tube back and this will be very uncomfortable and difficult for the patient.  I want to see a persistent improvement of the stool output from the ileostomy and decreasing the dilation of the small bowel on the x-rays.  I also want the patient to start moving more, I will order physical therapy evaluation.  I encouraged the patient to improve his mood.  He is very concerned about his progress and I discussed with him that has this is  normally a slow progress of improvement that at this moment we are seen key signs of improvement like stool output, air in the bag and improvement in the bowel distention even though he has not resolved completely.  I discussed the case with his son and talked to him about the plan in a short and long-term.  I cannot say right now how long it will take to get better but I prepared the patient for a long hospitalization with the hope that the bowel start to move better and give patient more comfort and quality of life.  I will keep patient hydrated.  I let the patient have ice chips.  I appreciate the hospitalist evaluation and recommendation regarding  his medical management.  We will continue serial physical exams.  Patient currently on DVT prophylaxis.  Arnold Long, MD

## 2018-08-07 NOTE — Evaluation (Signed)
Physical Therapy Evaluation Patient Details Name: Ryan Allison MRN: [id number] DOB: [birth year] Today's Date: 08/07/2018   History of Present Illness  78 year old male with past medical history of essential hypertension, hyperlipidemia, obstructive sleep apnea, neuropathy, history of prostate cancer, previous history of GI bleed, atrial flutter, ileostomy, who presents to the hospital due to abdominal pain, workup shows small bowel obstruction, NG tube in place.     Clinical Impression  Patient agreeable to PT evaluation with encouragement. Stated that his stomach hurts, RN aware. Pt reported that he normally lives alone but that his son is staying with him to help out as necessary. 3 STE, previously independent.   Patient demonstrated transfers with CGA and RW. Patient ambulated ~159ft with RW And CGA as well, decreased gait velocity noted, gait mechanics WFLs, pt tended to ambulate with RW outside base of support.  Demonstrated generalized weakness and decreased activity tolerance. Overall the patient demonstrated deficits (see "PT Problem List") that impede the patient's functional abilities, safety, and mobility and would benefit from skilled PT intervention. Recommendation is HHPT with supervision for mobility/OOB for safety.     Follow Up Recommendations Home health PT;Supervision for mobility/OOB    Equipment Recommendations  Rolling walker with 5" wheels    Recommendations for Other Services       Precautions / Restrictions Precautions Precautions: Fall Restrictions Weight Bearing Restrictions: No      Mobility  Bed Mobility               General bed mobility comments: deferred up in recliner  Transfers Overall transfer level: Needs assistance Equipment used: Rolling walker (2 wheeled) Transfers: Sit to/from Stand Sit to Stand: Min guard            Ambulation/Gait Ambulation/Gait assistance: Min guard;Supervision Gait Distance (Feet): 180  Feet Assistive device: Rolling walker (2 wheeled) Gait Pattern/deviations: WFL(Within Functional Limits) Gait velocity: decreased      Stairs            Wheelchair Mobility    Modified Rankin (Stroke Patients Only)       Balance Overall balance assessment: Needs assistance   Sitting balance-Leahy Scale: Fair       Standing balance-Leahy Scale: Fair                               Pertinent Vitals/Pain Pain Assessment: Faces Faces Pain Scale: Hurts little more Pain Location: with transfers Pain Descriptors / Indicators: Grimacing;Guarding;Moaning Pain Intervention(s): Limited activity within patient's tolerance;Monitored during session;Repositioned    Home Living Family/patient expects to be discharged to:: Private residence Living Arrangements: Alone Available Help at Discharge: Family Type of Home: House Home Access: Stairs to enter Entrance Stairs-Rails: Right Entrance Stairs-Number of Steps: 3 Home Layout: One level Home Equipment: Clinical cytogeneticist - 4 wheels;Grab bars - tub/shower      Prior Function Level of Independence: Independent         Comments: denies any falls in the last 6 months     Hand Dominance        Extremity/Trunk Assessment   Upper Extremity Assessment Upper Extremity Assessment: Generalized weakness    Lower Extremity Assessment Lower Extremity Assessment: Generalized weakness       Communication   Communication: No difficulties  Cognition Arousal/Alertness: Awake/alert Behavior During Therapy: WFL for tasks assessed/performed Overall Cognitive Status: Within Functional Limits for tasks assessed  General Comments: Pt kept head bowed and eyes down throughout session.      General Comments      Exercises     Assessment/Plan    PT Assessment Patient needs continued PT services  PT Problem List Decreased strength;Cardiopulmonary status limiting  activity;Decreased range of motion;Decreased activity tolerance;Decreased balance;Decreased safety awareness;Decreased mobility;Decreased knowledge of precautions       PT Treatment Interventions DME instruction;Therapeutic exercise;Gait training;Balance training;Stair training;Neuromuscular re-education;Functional mobility training;Therapeutic activities;Patient/family education    PT Goals (Current goals can be found in the Care Plan section)  Acute Rehab PT Goals Patient Stated Goal: Patient wants to feel better PT Goal Formulation: With patient Time For Goal Achievement: 08/21/18 Potential to Achieve Goals: Good    Frequency Min 2X/week   Barriers to discharge        Co-evaluation               AM-PAC PT "6 Clicks" Mobility  Outcome Measure Help needed turning from your back to your side while in a flat bed without using bedrails?: A Little Help needed moving from lying on your back to sitting on the side of a flat bed without using bedrails?: A Little Help needed moving to and from a bed to a chair (including a wheelchair)?: A Little Help needed standing up from a chair using your arms (e.g., wheelchair or bedside chair)?: A Little Help needed to walk in hospital room?: A Little Help needed climbing 3-5 steps with a railing? : A Lot 6 Click Score: 17    End of Session Equipment Utilized During Treatment: Gait belt Activity Tolerance: Patient tolerated treatment well Patient left: in chair;with call bell/phone within reach;with chair alarm set Nurse Communication: Mobility status PT Visit Diagnosis: Other abnormalities of gait and mobility (R26.89);Difficulty in walking, not elsewhere classified (R26.2);Muscle weakness (generalized) (M62.81)    Time: 6387-5643 PT Time Calculation (min) (ACUTE ONLY): 24 min   Charges:   PT Evaluation $PT Eval Moderate Complexity: 1 Mod PT Treatments $Therapeutic Activity: 8-22 mins      Lieutenant Diego PT, DPT 12:59  PM,08/07/18 [phone]

## 2018-08-08 ENCOUNTER — Inpatient Hospital Stay: Payer: Medicare Other

## 2018-08-08 MED ORDER — TRAMADOL HCL 50 MG PO TABS
50.0000 mg | ORAL_TABLET | Freq: Four times a day (QID) | ORAL | Status: DC | PRN
  Administered 2018-08-09: 50 mg via ORAL
  Filled 2018-08-08: qty 1

## 2018-08-08 MED ORDER — PANTOPRAZOLE SODIUM 40 MG PO TBEC
40.0000 mg | DELAYED_RELEASE_TABLET | Freq: Every day | ORAL | Status: DC
  Administered 2018-08-09 – 2018-08-10 (×2): 40 mg via ORAL
  Filled 2018-08-08 (×2): qty 1

## 2018-08-08 MED ORDER — BOOST / RESOURCE BREEZE PO LIQD CUSTOM
1.0000 | Freq: Three times a day (TID) | ORAL | Status: DC
  Administered 2018-08-08 – 2018-08-09 (×4): 1 via ORAL

## 2018-08-08 NOTE — Progress Notes (Signed)
[facility] [place] NAME: Ryan Allison    MR#:  [id number]  DATE OF BIRTH:  [birth year]  SUBJECTIVE:  CHIEF COMPLAINT:   Chief Complaint  Patient presents with  . Abdominal Pain   The patient has no abdominal pain, nausea or vomiting. REVIEW OF SYSTEMS:  Review of Systems  Constitutional: Negative for chills, fever and malaise/fatigue.  HENT: Negative for sore throat.   Eyes: Negative for blurred vision and double vision.  Respiratory: Negative for cough, hemoptysis, shortness of breath, wheezing and stridor.   Cardiovascular: Negative for chest pain, palpitations, orthopnea and leg swelling.  Gastrointestinal: Negative for abdominal pain, blood in stool, diarrhea, melena, nausea and vomiting.  Genitourinary: Negative for dysuria, flank pain and hematuria.  Musculoskeletal: Negative for back pain and joint pain.  Skin: Negative for rash.  Neurological: Negative for dizziness, sensory change, focal weakness, seizures, loss of consciousness, weakness and headaches.  Endo/Heme/Allergies: Negative for polydipsia.  Psychiatric/Behavioral: Negative for depression. The patient is not nervous/anxious.     DRUG ALLERGIES:   Allergies  Allergen Reactions  . Nsaids Other (See Comments)    Diverticular bleed Diverticular bleed  . Tolmetin     Other reaction(s): Other (See Comments) Diverticular bleed Diverticular bleed  . Celebrex [Celecoxib] Itching and Rash  . Diltiazem Hcl Rash  . Other Other (See Comments)    Diverticular bleed  . Venlafaxine Other (See Comments)    sedation sedation   VITALS:  Blood pressure (!) 144/89, pulse 77, temperature 98.5 F (36.9 C), temperature source Oral, resp. rate 18, height 5\' 11"  (1.803 m), weight 81.6 kg, SpO2 100 %. PHYSICAL EXAMINATION:  Physical Exam HENT:     Head: Normocephalic.     Mouth/Throat:     Mouth: Mucous membranes are moist.  Eyes:     General: No scleral icterus.  Conjunctiva/sclera: Conjunctivae normal.     Pupils: Pupils are equal, round, and reactive to light.  Neck:     Musculoskeletal: Normal range of motion and neck supple.     Vascular: No JVD.     Trachea: No tracheal deviation.  Cardiovascular:     Rate and Rhythm: Normal rate and regular rhythm.     Heart sounds: Normal heart sounds. No murmur. No gallop.   Pulmonary:     Effort: Pulmonary effort is normal. No respiratory distress.     Breath sounds: Normal breath sounds. No wheezing or rales.  Abdominal:     General: Bowel sounds are normal. There is no distension.     Palpations: Abdomen is soft.     Tenderness: There is abdominal tenderness. There is no rebound.     Comments: Colectomy bag in situ.  Musculoskeletal: Normal range of motion.        General: No tenderness.     Right lower leg: No edema.     Left lower leg: No edema.  Skin:    Findings: No erythema or rash.  Neurological:     General: No focal deficit present.     Mental Status: He is alert and oriented to person, place, and time.     Cranial Nerves: No cranial nerve deficit.  Psychiatric:        Mood and Affect: Mood normal.    LABORATORY PANEL:  Male CBC Recent Labs  Lab 08/06/18 0456  WBC 5.1  HGB 11.4*  HCT 35.4*  PLT 327   ------------------------------------------------------------------------------------------------------------------ Chemistries  Recent Labs  Lab 08/05/18 1244 08/06/18  0456  NA 134* 136  K 3.8 3.7  CL 92* 100  CO2 32 28  GLUCOSE 140* 106*  BUN 18 16  CREATININE 1.10 0.89  CALCIUM 9.5 8.7*  AST 15  --   ALT 11  --   ALKPHOS 79  --   BILITOT 0.7  --    RADIOLOGY:  Dg Abd 2 Views  Result Date: 08/08/2018 CLINICAL DATA:  Follow-up small bowel obstruction EXAM: ABDOMEN - 2 VIEW COMPARISON:  08/07/2018 abdominal radiographs FINDINGS: Enteric tube terminates in the proximal stomach. Ileostomy apparatus overlies the right lower abdomen. Mildly dilated small bowel loop in  the right abdomen measuring 3.6 cm diameter, decreased from 4.2 cm. Overall decreased caliber of small bowel loops. No evidence of pneumatosis or pneumoperitoneum. Several stones overlie the lower right kidney, largest 5 mm. Fiducial markers overlie the prostate. Moderate lumbar spondylosis. IMPRESSION: 1. Enteric tube terminates in the proximal stomach. 2. Improving distal small bowel obstruction, with residual mildly dilated small bowel loops in the right abdomen. Electronically Signed   By: Ilona Sorrel M.D.   On: 08/08/2018 08:35   ASSESSMENT AND PLAN:   78 year old male with past medical history of essential hypertension, hyperlipidemia, obstructive sleep apnea, neuropathy, history of prostate cancer, previous history of GI bleed, atrial flutter who presents to the hospital due to abdominal pain.  1.  Small bowel obstruction-this is a cause of patient abdominal pain nausea vomiting.  Patient CT scan of the abdomen pelvis concerning for this. He is treated with n.p.o., supportive care with NG tube decompression, IV fluids, antiemetics and pain control.   Remove the NGT and start clear liquid diet per Dr. Windell Moment.  2.  Essential hypertension- blood pressures currently stable.  resume metoprolol. Continue IV hydralazine as needed.  3.  GERD-continue IV Protonix.  4.  Hyperlipidemia-we will resume Zocor when patient can take p.o.  5.  History of prostate cancer/BPH-no evidence of urinary retention presently.  Will resume patient's Casodex and Flomax when appropriate.  I discussed with Dr. Windell Moment. All the records are reviewed and case discussed with Care Management/Social Worker. Management plans discussed with the patient, family and they are in agreement.  CODE STATUS: Full Code  TOTAL TIME TAKING CARE OF THIS PATIENT: 26 minutes.   More than 50% of the time was spent in counseling/coordination of care: YES  POSSIBLE D/C IN 2 DAYS, DEPENDING ON CLINICAL CONDITION.    Demetrios Loll M.D on 08/08/2018 at 12:42 PM  Between 7am to 6pm - Pager - [phone]  After 6pm go to [email or web address] - [facility]

## 2018-08-08 NOTE — TOC Initial Note (Signed)
Transition of Care [facility]) - Initial/Assessment Note    Patient Details  Name: Ryan Allison MRN: [id number] Date of Birth: 01-23-1941  [facility] CM/SW Contact:    Beverly Sessions, RN Phone Number: 08/08/2018, 4:18 PM  Clinical Narrative:                 Patient admitted from home with SBO.  Patient lives at home alone.  PCP Caryl Comes.  Ph[facility].  Patient denies issues obtaining medications.  At baseline patient is independent. Patient is currently open with [place] [facility] for RN and [place] with [facility] aware of admission. PT has assessed patient and recommends  Home health PT.  Patient agreeable to continue.  Patient has Rw and shower seat in the home. RNCM following   Expected Discharge Plan: Home/Self Care Barriers to Discharge: Continued Medical Work up   Patient Goals and CMS Choice Patient states their goals for this hospitalization and ongoing recovery are:: "I want to be able to keep this stuff down"      Expected Discharge Plan and Services Expected Discharge Plan: Home/Self Care   Discharge Planning Services: CM Consult   Living arrangements for the past 2 months: Single Family Home                     HH Arranged: RN, PT [place] Agency: [facility])  Prior Living Arrangements/Services Living arrangements for the past 2 months: Single Family Home Lives with:: Self   Do you feel safe going back to the place where you live?: Yes      Need for Family Participation in Patient Care: No (Comment) Care giver support system in place?: Yes (comment) Current home services: Home PT, Home RN, DME    Activities of Daily Living Home Assistive Devices/Equipment: Environmental consultant (specify type)(rolling walker) ADL Screening (condition at time of admission) Patient's cognitive ability adequate to safely complete daily activities?: Yes Is the patient deaf or have difficulty hearing?: No Does the patient have difficulty  seeing, even when wearing glasses/contacts?: No Does the patient have difficulty concentrating, remembering, or making decisions?: No Patient able to express need for assistance with ADLs?: Yes Does the patient have difficulty dressing or bathing?: No Independently performs ADLs?: Yes (appropriate for developmental age) Does the patient have difficulty walking or climbing stairs?: Yes Weakness of Legs: Both Weakness of Arms/Hands: None  Permission Sought/Granted                  Emotional Assessment Appearance:: Appears stated age Attitude/Demeanor/Rapport: Engaged Affect (typically observed): Accepting Orientation: : Oriented to Place, Oriented to  Time, Oriented to Situation, Oriented to Self      Admission diagnosis:  Small bowel obstruction ([place]) [K56.609] Patient Active Problem List   Diagnosis Date Noted  . Small bowel obstruction ([place]) 08/05/2018  . Hematochezia   . Internal hemorrhoids   . Ulcer of anus and rectum   . Diverticulosis of large intestine without diverticulitis   . B12 deficiency 01/30/2017  . Diplopia 01/30/2017  . Diverticulosis 01/30/2017  . Hyperlipidemia 01/30/2017  . Kyphoscoliosis 01/30/2017  . Malignant neoplasm of prostate ([place]) 01/30/2017  . Osteopenia 01/30/2017  . Peripheral polyneuropathy 01/30/2017  . 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 Status post eye surgery 07/17/2016  . Acute gastrointestinal hemorrhage 05/06/2016  . Cancer of prostate ([place]) 04/03/2016  . Aortic atherosclerosis ([place]) 10/21/2015  . DDD (degenerative disc  disease), lumbar 10/21/2015  . GERD (gastroesophageal reflux disease) 01/20/2014  . Esotropia 03/12/2012  . 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 Myopia of both eyes 03/12/2012  . Amblyopia of right eye 02/06/2012  . Senile nuclear sclerosis 02/06/2012   PCP:  Adin Hector, MD Pharmacy:   [place] [place] [facility] [address] [place] [address] Phone: [phone] Fax: [phone]     Social Determinants of Health (SDOH) Interventions    Readmission Risk Interventions No flowsheet data found.

## 2018-08-08 NOTE — Care Management Important Message (Signed)
Important Message  Patient Details  Name: Ryan Allison MRN: [id number] Date of Birth: August 09, 1940   Medicare Important Message Given:  Yes    Dannette Barbara 08/08/2018, 12:36 PM

## 2018-08-08 NOTE — Progress Notes (Signed)
Patient inquiring about PTA med Tramadol. Contacted Dr Windell Moment. Ordered Tramadol 50mg  Q6PRN. New orders placed.

## 2018-08-08 NOTE — Progress Notes (Signed)
[place] Hospital Day(s): 3.   Post op day(s):  Marland Kitchen   Interval History: Patient seen and examined, no acute events or new complaints overnight. Patient reports improving abdominal pain.  Denies nausea or vomiting despite having ice chips yesterday and the NG tube has been clamped for 24 hours.   Vital signs in last 24 hours: [min-max] current  Temp:  [98.1 F (36.7 C)-98.3 F (36.8 C)] 98.3 F (36.8 C) (03/26 0511) Pulse Rate:  [72-89] 72 (03/26 0751) Resp:  [14-20] 14 (03/26 0511) BP: (133-153)/(65-81) 143/75 (03/26 0751) SpO2:  [97 %-100 %] 100 % (03/26 0751)     Height: 5\' 11"  (180.3 cm) Weight: 81.6 kg BMI (Calculated): 25.1   Ostomy: 150 mL  Physical Exam:  Constitutional: alert, cooperative and no distress  Respiratory: breathing non-labored at rest  Cardiovascular: regular rate and sinus rhythm  Gastrointestinal: soft, non-tender, and non-distended.  Midline wound healing well.  Ostomy pink and patent with stool and air in the bag.  Labs:  CBC Latest Ref Rng & Units 08/06/2018 08/05/2018 06/08/2018  WBC 4.0 - 10.5 K/uL 5.1 6.7 6.8  Hemoglobin 13.0 - 17.0 g/dL 11.4(L) 13.2 11.6(L)  Hematocrit 39.0 - 52.0 % 35.4(L) 40.2 34.2(L)  Platelets 150 - 400 K/uL 327 385 192   CMP Latest Ref Rng & Units 08/06/2018 08/05/2018 06/07/2018  Glucose 70 - 99 mg/dL 106(H) 140(H) 132(H)  BUN 8 - 23 mg/dL 16 18 13   Creatinine 0.61 - 1.24 mg/dL 0.89 1.10 0.71  Sodium 135 - 145 mmol/L 136 134(L) 139  Potassium 3.5 - 5.1 mmol/L 3.7 3.8 3.8  Chloride 98 - 111 mmol/L 100 92(L) 106  CO2 22 - 32 mmol/L 28 32 27  Calcium 8.9 - 10.3 mg/dL 8.7(L) 9.5 8.5(L)  Total Protein 6.5 - 8.1 g/dL - 8.0 -  Total Bilirubin 0.3 - 1.2 mg/dL - 0.7 -  Alkaline Phos 38 - 126 U/L - 79 -  AST 15 - 41 U/L - 15 -  ALT 0 - 44 U/L - 11 -    Imaging studies: I evaluated the abdominal x-ray done today and there is a significant decrease of the small bowel.  I did not see small bowel distended this  time.   Assessment/Plan:  78 y.o.malewith small bowel obstruction, complicated by pertinent comorbidities includingatrial flutter,sleep apnea on CPAP,hypertension,history of prostate cancer. Patient today feeling better with no nausea or vomiting and decreased abdominal pain.  Abdominal x-ray also showed improvement of the small bowel dilation.  I remove the NGT and ordered clear liquid diets to assess for toleration.  Will advance diet slowly to see if patient tolerated without being too aggressive.  I have not to the patient that he can drink as much as she can avoid into overeat that can cause him nausea.  Will be fever or tachycardia in the physical exam shows nondistention of the abdomen.  We will continue with conservative management of small bowel obstruction and advance diet as tolerated.  Patient currently on DVT prophylaxis.  I appreciate hospitalist input regarding medical management of his medical condition.  Encouraged to ambulate to get stronger and with more energy.  Arnold Long, MD

## 2018-08-09 MED ORDER — ENSURE ENLIVE PO LIQD: 237.0000 mL | Freq: Three times a day (TID) | ORAL | Status: DC

## 2018-08-09 MED ORDER — ADULT MULTIVITAMIN W/MINERALS CH
1.0000 | ORAL_TABLET | Freq: Every day | ORAL | Status: DC
  Administered 2018-08-09 – 2018-08-10 (×2): 1 via ORAL
  Filled 2018-08-09 (×2): qty 1

## 2018-08-09 MED ORDER — FAMOTIDINE 20 MG PO TABS
20.0000 mg | ORAL_TABLET | Freq: Two times a day (BID) | ORAL | Status: DC
  Administered 2018-08-09 – 2018-08-10 (×3): 20 mg via ORAL
  Filled 2018-08-09 (×3): qty 1

## 2018-08-09 NOTE — Progress Notes (Signed)
[facility] [place] NAME: Ryan Allison    MR#:  [id number]  DATE OF BIRTH:  [birth year]  SUBJECTIVE:  CHIEF COMPLAINT:   Chief Complaint  Patient presents with  . Abdominal Pain   The patient has no abdominal pain, nausea or vomiting.  Off NGT. REVIEW OF SYSTEMS:  Review of Systems  Constitutional: Negative for chills, fever and malaise/fatigue.  HENT: Negative for sore throat.   Eyes: Negative for blurred vision and double vision.  Respiratory: Negative for cough, hemoptysis, shortness of breath, wheezing and stridor.   Cardiovascular: Negative for chest pain, palpitations, orthopnea and leg swelling.  Gastrointestinal: Negative for abdominal pain, blood in stool, diarrhea, melena, nausea and vomiting.  Genitourinary: Negative for dysuria, flank pain and hematuria.  Musculoskeletal: Negative for back pain and joint pain.  Skin: Negative for rash.  Neurological: Negative for dizziness, sensory change, focal weakness, seizures, loss of consciousness, weakness and headaches.  Endo/Heme/Allergies: Negative for polydipsia.  Psychiatric/Behavioral: Negative for depression. The patient is not nervous/anxious.     DRUG ALLERGIES:   Allergies  Allergen Reactions  . Nsaids Other (See Comments)    Diverticular bleed Diverticular bleed  . Tolmetin     Other reaction(s): Other (See Comments) Diverticular bleed Diverticular bleed  . Celebrex [Celecoxib] Itching and Rash  . Diltiazem Hcl Rash  . Other Other (See Comments)    Diverticular bleed  . Venlafaxine Other (See Comments)    sedation sedation   VITALS:  Blood pressure (!) 163/82, pulse 87, temperature 98.6 F (37 C), temperature source Oral, resp. rate 18, height 5\' 11"  (1.803 m), weight 81.6 kg, SpO2 99 %. PHYSICAL EXAMINATION:  Physical Exam HENT:     Head: Normocephalic.     Mouth/Throat:     Mouth: Mucous membranes are moist.  Eyes:     General: No scleral icterus.     Conjunctiva/sclera: Conjunctivae normal.     Pupils: Pupils are equal, round, and reactive to light.  Neck:     Musculoskeletal: Normal range of motion and neck supple.     Vascular: No JVD.     Trachea: No tracheal deviation.  Cardiovascular:     Rate and Rhythm: Normal rate and regular rhythm.     Heart sounds: Normal heart sounds. No murmur. No gallop.   Pulmonary:     Effort: Pulmonary effort is normal. No respiratory distress.     Breath sounds: Normal breath sounds. No wheezing or rales.  Abdominal:     General: Bowel sounds are normal. There is no distension.     Palpations: Abdomen is soft.     Tenderness: There is abdominal tenderness. There is no rebound.     Comments: Colectomy bag in situ.  Musculoskeletal: Normal range of motion.        General: No tenderness.     Right lower leg: No edema.     Left lower leg: No edema.  Skin:    Findings: No erythema or rash.  Neurological:     General: No focal deficit present.     Mental Status: He is alert and oriented to person, place, and time.     Cranial Nerves: No cranial nerve deficit.  Psychiatric:        Mood and Affect: Mood normal.    LABORATORY PANEL:  Male CBC Recent Labs  Lab 08/06/18 0456  WBC 5.1  HGB 11.4*  HCT 35.4*  PLT 327   ------------------------------------------------------------------------------------------------------------------ Chemistries  Recent  Labs  Lab 08/05/18 1244 08/06/18 0456  NA 134* 136  K 3.8 3.7  CL 92* 100  CO2 32 28  GLUCOSE 140* 106*  BUN 18 16  CREATININE 1.10 0.89  CALCIUM 9.5 8.7*  AST 15  --   ALT 11  --   ALKPHOS 79  --   BILITOT 0.7  --    RADIOLOGY:  No results found. ASSESSMENT AND PLAN:   78 year old male with past medical history of essential hypertension, hyperlipidemia, obstructive sleep apnea, neuropathy, history of prostate cancer, previous history of GI bleed, atrial flutter who presents to the hospital due to abdominal pain.  1.  Small  bowel obstruction-this is a cause of patient abdominal pain nausea vomiting.  Patient CT scan of the abdomen pelvis concerning for this. He is treated with n.p.o., supportive care with NG tube decompression, IV fluids, antiemetics and pain control.   He tolerated full liquid diet, advance to soft diet per Dr. Windell Moment. Discontinue IV fluid.  2.  Essential hypertension- blood pressures currently stable.  resumed metoprolol. Continue IV hydralazine as needed.  3.  GERD-continue po Protonix.  4.  Hyperlipidemia-resumed Zocor.  5.  History of prostate cancer/BPH-no evidence of urinary retention presently. Resumed patient's Casodex and Flomax when appropriate.  Medically stable.  Sign off. All the records are reviewed and case discussed with Care Management/Social Worker. Management plans discussed with the patient, family and they are in agreement.  CODE STATUS: Full Code  TOTAL TIME TAKING CARE OF THIS PATIENT: 26 minutes.   More than 50% of the time was spent in counseling/coordination of care: YES  POSSIBLE D/C IN 1 DAYS, DEPENDING ON CLINICAL CONDITION.   Demetrios Loll M.D on 08/09/2018 at 12:45 PM  Between 7am to 6pm - Pager - [phone]  After 6pm go to [email or web address] - [facility]

## 2018-08-09 NOTE — Progress Notes (Signed)
Nutrition Follow-up  RD working remotely.  DOCUMENTATION CODES:   Not applicable  INTERVENTION:   -D/c Boost Breeze po TID, each supplement provides 250 kcal and 9 grams of protein -Ensure Enlive po TID, each supplement provides 350 kcal and 20 grams of protein -MVI with minerals daily -Refer to outpatient nutrition counseling through [facility], per pt's request  NUTRITION DIAGNOSIS:   Unintentional weight loss related to acute illness as evidenced by percent weight loss.  Ongoing  GOAL:   Patient will meet greater than or equal to 90% of their needs  Progressing  MONITOR:   PO intake, Supplement acceptance, Labs, Weight trends, Skin, I & O's  REASON FOR ASSESSMENT:   Malnutrition Screening Tool    ASSESSMENT:   78 y.o. 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 She is requesting that RD evaluate pt as possible as MD is concerned with pt's poor appetite and weight loss.   Spoke with pt via phone, who reports his appetite has been "off an on" for a while. He consumed some pudding and broth today for lunch without difficulty. He reports he ordered a chicken salad sandwich for lunch, but did not receive it yet. He denies his any nausea, vomiting, or abdominal pain at this time.   Pt reports consuming Boost Breeze supplements, however, is too sweet for him. He was consuming approximately 1 Ensure per day at breakfast and pt is amenable to try Ensure supplements in hospital. Discussed importance of good meal and supplement intake to promote healing. Also encouraged use of Ensure at home. Educated pt about ways that he could include more calories and protein in his diet,  including eating small, frequent meals.   Pt expressed desire to meet with an RD as an outpatient, so he and his caregiver can help improve his nutritional status. Pt lives locally, but has never been to [place], but is agreeable to go. RD to refer.   Called and discussed case with Dr. Peyton Najjar, who gave RD verbal permission for RD to implement above interventions.   Labs reviewed.   Diet Order:   Diet Order            DIET SOFT Room service appropriate? Yes; Fluid consistency: Thin  Diet effective now              EDUCATION NEEDS:   Education needs have been addressed  Skin:  Skin Assessment: Reviewed RN Assessment(closed incision abdomen )  Last BM:  08/09/18 (25 ml output via colostomy)  Height:   Ht Readings from Last 1 Encounters:  08/05/18 5\' 11"  (1.803 m)    Weight:   Wt Readings from Last 1 Encounters:  08/05/18 81.6 kg    Ideal Body Weight:  78.1 kg  BMI:  Body mass index is 25.09 kg/m.  Estimated Nutritional Needs:   Kcal:  1900-2200kcal/day  Protein:  90-106g/day   Fluid:  >1.9L/day     Lashell Moffitt A. Jimmye Norman, RD, LDN, [place] Registered Dietitian II Certified Diabetes Care and Education Specialist Pager: [phone] After hours Pager: [phone]

## 2018-08-09 NOTE — Progress Notes (Signed)
PT Cancellation Note  Patient Details Name: Ryan Allison MRN: [id number] DOB: 1940-06-21   Cancelled Treatment:    Reason Eval/Treat Not Completed: Other (comment) Pt is sitting up in recliner in NAD, having walked ~1000 ft with PT earlier today.  Pt reports that he feels okay about going home safely and does not have any concerns or questions at this time.  Did not wish to participate with more formal PT at this time, likely to d/c tomorrow per continued progress.  Questions answered, formal PT deferred this date.  Kreg Shropshire, DPT 08/09/2018, 5:15 PM

## 2018-08-09 NOTE — Progress Notes (Signed)
[facility] Hospital Day(s): 4.   Post op day(s):  [facility] Kitchen   Interval History: Patient seen and examined, no acute events or new complaints overnight. Patient reports having back pain but no abdominal pain, denies nausea or vomiting.  Reports she feels hungry for more solid diet.  Vital signs in last 24 hours: [min-max] current  Temp:  [98.6 F (37 C)] 98.6 F (37 C) (03/27 1540) Pulse Rate:  [74-87] 87 (03/27 0638) Resp:  [18-20] 18 (03/27 0867) BP: (153-163)/(75-82) 163/82 (03/27 0638) SpO2:  [99 %-100 %] 99 % (03/27 0638)     Height: 5\' 11"  (180.3 cm) Weight: 81.6 kg BMI (Calculated): 25.1   Physical Exam:  Constitutional: alert, cooperative and no distress  Respiratory: breathing non-labored at rest  Cardiovascular: regular rate and sinus rhythm  Gastrointestinal: soft, non-tender, and non-distended.  Ostomy pink and patent with stool and air in the bag.  Labs:  CBC Latest Ref Rng & Units 08/06/2018 08/05/2018 06/08/2018  WBC 4.0 - 10.5 K/uL 5.1 6.7 6.8  Hemoglobin 13.0 - 17.0 g/dL 11.4(L) 13.2 11.6(L)  Hematocrit 39.0 - 52.0 % 35.4(L) 40.2 34.2(L)  Platelets 150 - 400 K/uL 327 385 192   CMP Latest Ref Rng & Units 08/06/2018 08/05/2018 06/07/2018  Glucose 70 - 99 mg/dL 106(H) 140(H) 132(H)  BUN 8 - 23 mg/dL 16 18 13   Creatinine 0.61 - 1.24 mg/dL 0.89 1.10 0.71  Sodium 135 - 145 mmol/L 136 134(L) 139  Potassium 3.5 - 5.1 mmol/L 3.7 3.8 3.8  Chloride 98 - 111 mmol/L 100 92(L) 106  CO2 22 - 32 mmol/L 28 32 27  Calcium 8.9 - 10.3 mg/dL 8.7(L) 9.5 8.5(L)  Total Protein 6.5 - 8.1 g/dL - 8.0 -  Total Bilirubin 0.3 - 1.2 mg/dL - 0.7 -  Alkaline Phos 38 - 126 U/L - 79 -  AST 15 - 41 U/L - 15 -  ALT 0 - 44 U/L - 11 -    Imaging studies: No new pertinent imaging studies   Assessment/Plan:  78 y.o.malewith small bowel obstruction, complicated by pertinent comorbidities includingatrial flutter,sleep apnea on CPAP,hypertension,history of prostate cancer. Patient  today feeling better.  He reports he tolerated full liquids.  Last meal was potato soup.  He reports that he is hungry for more solid food.  I will advance diet to soft diet and assess for toleration.  I encouraged the patient to ambulate.  Currently on his oral medication for his medical conditions.  I appreciate hospitalist input.  Patient currently on DVT prophylaxis.  I have updated patient's son about patient clinical status.  If he tolerates soft diet he will be able to be discharged tomorrow.  Arnold Long, MD

## 2018-08-10 NOTE — TOC Transition Note (Signed)
Transition of Care [facility]) - CM/SW Discharge Note   Patient Details  Name: Ryan Allison MRN: [id number] Date of Birth: 03-27-1941  Transition of Care [facility]) CM/SW Contact:  Latanya Maudlin, RN Phone Number: 08/10/2018, 9:57 AM   Clinical Narrative:   Patient to be discharged per MD order. Orders in place for home health services. CMS Medicare.gov Compare Post Acute Care list reviewed with patient and he is active with [facility]. Patient wishes to resume home health. Notified Corene Cornea from [facility] care of pending discharge. Patient has rolling walker and shower chair in home, no other DME needed. Family to transport.     Final next level of care: [place] Barriers to Discharge: No Barriers Identified   Patient Goals and CMS Choice Patient states their goals for this hospitalization and ongoing recovery are:: "I want to be able to keep this stuff down" CMS Medicare.gov Compare Post Acute Care list provided to:: Patient Choice offered to / list presented to : Patient  Discharge Placement                       Discharge Plan and Services   Discharge Planning Services: CM Consult                HH Arranged: RN, PT [facility] Agency: [place] [facility]   Social Determinants of Health (SDOH) Interventions     Readmission Risk Interventions No flowsheet data found.

## 2018-08-10 NOTE — Discharge Instructions (Signed)
°  Diet: Resume home heart healthy soft diet.   Activity: No heavy lifting >20 pounds (children, pets, laundry, garbage) or strenuous activity until follow-up, but light activity and walking are encouraged. Do not drive or drink alcohol if taking narcotic pain medications.  Wound care: Continue ostomy care as you are doing.   Medications: Resume all home medications.   Call office [phone]) at any time if any questions, worsening pain, fevers/chills, bleeding, drainage from incision site, or other concerns.

## 2018-08-10 NOTE — Discharge Summary (Addendum)
Patient ID: Ryan Allison MRN: [id number] DOB/AGE: 11-24-1940 78 y.o.  Admit date: 08/05/2018 Discharge date: 08/10/2018   Discharge Diagnoses:  Active Problems:   Small bowel obstruction ([place])   Procedures: None  Hospital Course: The patient was admitted with a diagnosis of small bowel obstruction.  He was initially treated with bowel rest, NG tube drainage, IV hydration and pain control.  He was also treated with Gastrografin challenge that shows resolution of the small bowel obstruction after 24 hours of administration of the contrast.  The tube was clamped for 24 hours and he tried ice chips without any worsening of nausea or vomiting.  Eventually the nasogastric tube was removed.  Patient continued to have bowel movements and flatus through the ostomy.  He was started on clear liquid diet and advance to full liquid and eventually to soft diet.  Today patient ate without any abdominal pain.  He has ostomy output with gas and stool.  The wound is clean and dry.  He is ambulating without difficulty.  He tolerated physical therapies.  He has home health physical therapy and occasional therapy at home.  Dietitian discussed with him about supplementations.  Patient understand that he will follow-up with his surgeon at [facility] for the first follow-up visit but we will be available here if he has any concern.  Physical Exam  Constitutional: He is well-developed, well-nourished, and in no distress.  Cardiovascular: Normal rate, regular rhythm and normal heart sounds.  Pulmonary/Chest: Effort normal and breath sounds normal.  Abdominal: Soft. Bowel sounds are normal. He exhibits no distension. There is no abdominal tenderness.  Ostomy pink and patent. Stool and air in bag.   Consults: Hospitalist.   Disposition: Discharge disposition: 01-Home or Self Care      Discharge Instructions    Amb Referral to Nutrition and Diabetic E   Complete by:  As directed    Diet - low sodium heart healthy    Complete by:  As directed    Increase activity slowly   Complete by:  As directed      Allergies as of 08/10/2018      Reactions   Nsaids Other (See Comments)   Diverticular bleed Diverticular bleed   Tolmetin    Other reaction(s): Other (See Comments) Diverticular bleed Diverticular bleed   Celebrex [celecoxib] Itching, Rash   Diltiazem Hcl Rash   Other Other (See Comments)   Diverticular bleed   Venlafaxine Other (See Comments)   sedation sedation      Medication List    TAKE these medications   acetaminophen 500 MG tablet Commonly known as:  TYLENOL Take 500 mg by mouth daily as needed for fever or pain.   bicalutamide 50 MG tablet Commonly known as:  CASODEX Take 50 mg by mouth daily.   cyanocobalamin 1000 MCG tablet Take 1,000 mcg by mouth daily.   gabapentin 600 MG tablet Commonly known as:  NEURONTIN Take 600 mg by mouth 5 (five) times daily.   metoprolol tartrate 50 MG tablet Commonly known as:  LOPRESSOR Take 1 tablet (50 mg total) by mouth 2 (two) times daily.   MiraLax packet Generic drug:  polyethylene glycol Take 17 g by mouth daily as needed for mild constipation or moderate constipation.   omeprazole 40 MG capsule Commonly known as:  PRILOSEC Take 40 mg by mouth daily.   simvastatin 20 MG tablet Commonly known as:  ZOCOR Take 20 mg by mouth at bedtime.   tamsulosin 0.4 MG Caps capsule Commonly  known as:  FLOMAX Take 0.4 mg by mouth daily.   traMADol 50 MG tablet Commonly known as:  ULTRAM Take 50 mg by mouth every 6 (six) hours as needed for moderate pain.      Follow-up Information    Surgeon at [facility] Follow up in 2 week(s).   Why:  Recommend to follow up with your surgeon at [facility] as you follow up. I will be available at [facility] for any question or concern.        Tama High III, MD Follow up in 1 week(s).   Specialty:  Internal Medicine Contact information: [place]   [id number] [phone]          This was a more than 30 minute encounter, >50% of the time counseling and coordinating plan of care.

## 2018-08-30 ENCOUNTER — Telehealth: Payer: Self-pay

## 2018-08-30 NOTE — Telephone Encounter (Signed)
Virtual Visit Pre-Appointment Phone Call  Steps For Call:  1. Confirm consent - "In the setting of the current Covid19 crisis, you are scheduled for a video visit with your provider on Sep 30, 2018 at 10:00AM.  Just as we do with many in-office visits, in order for you to participate in this visit, we must obtain consent.  If you'd like, I can send this to your mychart (if signed up) or email for you to review.  Otherwise, I can obtain your verbal consent now.  All virtual visits are billed to your insurance company just like a normal visit would be.  By agreeing to a virtual visit, we'd like you to understand that the technology does not allow for your provider to perform an examination, and thus may limit your provider's ability to fully assess your condition. If your provider identifies any concerns that need to be evaluated in person, we will make arrangements to do so.  Finally, though the technology is pretty good, we cannot assure that it will always work on either your or our end, and in the setting of a video visit, we may have to convert it to a phone-only visit.  In either situation, we cannot ensure that we have a secure connection.  Are you willing to proceed?" STAFF: Did the patient verbally acknowledge consent to telehealth visit? Document YES/NO here: YES  2. Confirm the BEST phone number to call the day of the visit by including in appointment notes  3. Give patient instructions for WebEx/MyChart download to smartphone as below or Doximity/Doxy.me if video visit (depending on what platform provider is using)  4. Advise patient to be prepared with their blood pressure, heart rate, weight, any heart rhythm information, their current medicines, and a piece of paper and pen handy for any instructions they may receive the day of their visit  5. Inform patient they will receive a phone call 15 minutes prior to their appointment time (may be from unknown caller ID) so they should be  prepared to answer  6. Confirm that appointment type is correct in Epic appointment notes (VIDEO vs PHONE)     TELEPHONE CALL NOTE  ENOCH MOFFA has been deemed a candidate for a follow-up tele-health visit to limit community exposure during the Covid-19 pandemic. I spoke with the patient via phone to ensure availability of phone/video source, confirm preferred email & phone number, and discuss instructions and expectations.  I reminded NUR KRASINSKI to be prepared with any vital sign and/or heart rhythm information that could potentially be obtained via home monitoring, at the time of his visit. I reminded RACHEL SAMPLES to expect a phone call at the time of his visit if his visit.  Rene Paci McClain 08/30/2018 3:14 PM   INSTRUCTIONS FOR DOWNLOADING THE [place] APP TO SMARTPHONE  - If Apple, ask patient to go to [employer] and type in WebEx in the search bar. [employer], the blue/green circle. If Android, go to [employer] and type in [employer] in the search bar. The app is free but as with any other app downloads, their phone may require them to verify saved payment information or Apple/Android password.  - The patient does NOT have to create an account. - On the day of the visit, the assist will walk the patient through joining the meeting with the meeting number/password.  INSTRUCTIONS FOR DOWNLOADING THE MYCHART APP TO SMARTPHONE  - The patient must first  make sure to have activated MyChart and know their login information - If Apple, go to [employer] and type in MyChart in the search bar and download the app. If Android, ask patient to go to [employer] and type in [place] in the search bar and download the app. The app is free but as with any other app downloads, their phone may require them to verify saved payment information or Apple/Android password.  - The patient will need to then log into the app with their MyChart username and password, and  select [facility] as their healthcare provider to link the account. When it is time for your visit, go to the MyChart app, find appointments, and click Begin Video Visit. Be sure to Select Allow for your device to access the Microphone and Camera for your visit. You will then be connected, and your provider will be with you shortly.  **If they have any issues connecting, or need assistance please contact MyChart service desk [phone])**  **If using a computer, in order to ensure the best quality for their visit they will need to use either of the following Internet Browsers: [employer], or Google Chrome**  IF USING DOXIMITY or DOXY.ME - The patient will receive a link just prior to their visit, either by text or email (to be determined day of appointment depending on if it's doxy.me or Doximity).     FULL LENGTH CONSENT FOR TELE-HEALTH VISIT   I hereby voluntarily request, consent and authorize [facility] and its employed or contracted physicians, physician assistants, nurse practitioners or other licensed health care professionals (the Practitioner), to provide me with telemedicine health care services (the "Services") as deemed necessary by the treating Practitioner. I acknowledge and consent to receive the Services by the Practitioner via telemedicine. I understand that the telemedicine visit will involve communicating with the Practitioner through live audiovisual communication technology and the disclosure of certain medical information by electronic transmission. I acknowledge that I have been given the opportunity to request an in-person assessment or other available alternative prior to the telemedicine visit and am voluntarily participating in the telemedicine visit.  I understand that I have the right to withhold or withdraw my consent to the use of telemedicine in the course of my care at any time, without affecting my right to future care or treatment, and that  the Practitioner or I may terminate the telemedicine visit at any time. I understand that I have the right to inspect all information obtained and/or recorded in the course of the telemedicine visit and may receive copies of available information for a reasonable fee.  I understand that some of the potential risks of receiving the Services via telemedicine include:  Marland Kitchen Delay or interruption in medical evaluation due to technological equipment failure or disruption; . Information transmitted may not be sufficient (e.g. poor resolution of images) to allow for appropriate medical decision making by the Practitioner; and/or  . In rare instances, security protocols could fail, causing a breach of personal health information.  Furthermore, I acknowledge that it is my responsibility to provide information about my medical history, conditions and care that is complete and accurate to the best of my ability. I acknowledge that Practitioner's advice, recommendations, and/or decision may be based on factors not within their control, such as incomplete or inaccurate data provided by me or distortions of diagnostic images or specimens that may result from electronic transmissions. I understand that the practice of medicine is not an  exact science and that Practitioner makes no warranties or guarantees regarding treatment outcomes. I acknowledge that I will receive a copy of this consent concurrently upon execution via email to the email address I last provided but may also request a printed copy by calling the office of [facility].    I understand that my insurance will be billed for this visit.   I have read or had this consent read to me. . I understand the contents of this consent, which adequately explains the benefits and risks of the Services being provided via telemedicine.  . I have been provided ample opportunity to ask questions regarding this consent and the Services and have had my questions answered to  my satisfaction. . I give my informed consent for the services to be provided through the use of telemedicine in my medical care  By participating in this telemedicine visit I agree to the above.

## 2018-09-19 ENCOUNTER — Ambulatory Visit: Admit: 2018-09-19 | Discharge: 2018-09-20 | Payer: MEDICARE

## 2018-09-19 ENCOUNTER — Telehealth
Admit: 2018-09-19 | Discharge: 2018-09-20 | Payer: MEDICARE | Attending: Hematology & Oncology | Primary: Hematology & Oncology

## 2018-09-19 DIAGNOSIS — C61 Malignant neoplasm of prostate: Principal | ICD-10-CM

## 2018-09-19 DIAGNOSIS — C778 Secondary and unspecified malignant neoplasm of lymph nodes of multiple regions: Secondary | ICD-10-CM

## 2018-09-29 NOTE — Progress Notes (Signed)
Virtual Visit via Video Note   This visit type was conducted due to national recommendations for restrictions regarding the COVID-19 Pandemic (e.g. social distancing) in an effort to limit this patient's exposure and mitigate transmission in our community.  Due to his co-morbid illnesses, this patient is at least at moderate risk for complications without adequate follow up.  This format is felt to be most appropriate for this patient at this time.  All issues noted in this document were discussed and addressed.  A limited physical exam was performed with this format.  Please refer to the patient's chart for his consent to telehealth for [facility].   I connected with  Ryan Allison on 09/30/18 by a video enabled telemedicine application and verified that I am speaking with the correct person using two identifiers. I discussed the limitations of evaluation and management by telemedicine. The patient expressed understanding and agreed to proceed.   Evaluation Performed:  Follow-up visit  Date:  09/30/2018   ID:  Ryan Allison 1941/04/08, MRN [id number]  Patient Location:  [place] [address]   Provider location:   [facility], [place] office  PCP:  Adin Hector, MD  Cardiologist:  Ryan Allison   Chief Complaint: Palpitations, Ryan flutter    History of Present Illness:    Ryan Allison is a 78 y.o. male who presents via audio/video conferencing for a telehealth visit today.   The patient does not symptoms concerning for COVID-19 infection (fever, chills, cough, or new SHORTNESS OF BREATH).   Patient has a past medical history of Ryan flutter of uncertain chronicity in 05/2018 not on full dose anticoagulation secondary to prior life threatening GI bleeds,  recurrent prostate cancer , PET scan from 04/2018 showing no metastatic disease,  aortic atherosclerosis,  HLD,  OSA on CPAP,  diverticulosis,  peripheral neuropathy,   prior tobacco abuse,  GERD In the [facility]  from 1/23 to 1/25 for GI bleed and newly diagnosed Ryan flutter Who presents for Ryan flutter   [facility] on 06/06/18 following 2 episodes of BRBPR with associated presyncope, SOB, and palpitations.   in Ryan flutter with RVR.   transfusion of pRBC secondary to blood loss. He was rate controlled.   not able to be placed on anticoagulation given recurrent GI bleeding.   colonoscopy on 06/07/2018 that showed possible evidence of diverticular bleed in the sigmoid colon, diverticulosis of the transverse, ascending, and sigmoid colon as well as radiation proctitis. It was recommended he follow up with a colorectal surgeon for possible colectomy due to recurrent diverticular bleed.   Echo during his admission on 06/07/2018 showed an EF of 60-65%, normal wall motion, mild MR, left atrium normal in size, RVSF normal, PASP 33 mmHg, rhythm was Ryan flutter.  Seen by PCP on 06/11/2018, EKG showed he remained in Ryan flutter  PCP again on 06/17/2018 with a rash that started underneath his axilla, though spread diffusely. He had already self discontinued Cardizem with some improvement in rash. He was placed on metoprolol for rate control of his Ryan flutter.  Continues on metoprolol  Prior to GI surgery, had no sx of SOB from his Ryan flutter Underwent colectomy at [facility], for diverticuli and GI bleeding, 07/12/18 In follow up had SBO, at [facility] x 1 week Ostomy output has been good.  Drinking Gatorade to stay hydrated  Trying to recover, still weak, not doing much exercise  On eliquis 5 BID,  no recurrent GI bleeding  Lab work reviewed with him Total chol 155, LDL 80  EKG in 06/2018 was Ryan flutter  Some SOB with movement Previously with rash ,  Able to walk on the track, but sedentary  On metoprolol 25 once daily, not 50 mill grams twice daily  Occasionally has palpitations   Prior CV studies:   The following studies were reviewed today:   Echo 05/2018 - Left ventricle: The cavity size was normal. Systolic function was   normal. The estimated ejection fraction was in the range of 60%   to 65%. Wall motion was normal; there were no regional wall   motion abnormalities. - Mitral valve: There was mild regurgitation. - Left atrium: The atrium was normal in size. - Right ventricle: Systolic function was normal. - Pulmonary arteries: Systolic pressure was within the normal   range. PA peak pressure: 33 mm Hg (S).  Impressions: - Rhythm is Ryan flutter.   Past Medical History:  Diagnosis Date  . Aortic atherosclerosis ([place])   . Arthritis   . Ryan flutter ([place])    a. diagnosed 05/2018; b. not on [place] 2/2 recurrent life threatening GI bleed; c. CHADS2VASc => 3 (age x 2, vascular disease)  . Cancer [facility]) 2011   prostate  . GI bleed   . History of echocardiogram    a. TTE 05/2018: EF of 60-65%, normal wall motion, mild MR, left atrium normal in size, RVSF normal, PASP 33 mmHg, rhythm was Ryan flutter  . History of GI diverticular bleed    extended stay in the [facility] ICU due to GI bleeding prior to 2016  . HLD (hyperlipidemia)   . Hyperlipidemia   . Hypertension   . Meniscus tear   . OSA on CPAP   . Peripheral neuropathy    Past Surgical History:  Procedure Laterality Date  . COLONOSCOPY Left 06/07/2018   Procedure: COLONOSCOPY;  Surgeon: Ryan Manifold, MD;  Location: [facility] ENDOSCOPY;  Service: Endoscopy;  Laterality: Left;  . COLONOSCOPY WITH PROPOFOL N/A 04/06/2015   Procedure: COLONOSCOPY WITH PROPOFOL;  Surgeon: Ryan Sails, MD;  Location: [facility] END[facility]COPY;  Service: Endoscopy;  Laterality: N/A;  . COLONOSCOPY WITH PROPOFOL N/A 06/07/2018   Procedure: COLONOSCOPY WITH PROPOFOL;  Surgeon: Ryan Manifold, MD;  Location: [facility] ENDOSCOPY;  Service: Endoscopy;  Laterality: N/A;  . HERNIA REPAIR Left 09-23-14   inguinal  . INGUINAL HERNIA REPAIR Left 09/23/2014   Procedure: HERNIA REPAIR INGUINAL ADULT;   Surgeon: Christene Lye, MD;  Location: [facility] ORS;  Service: General;  Laterality: Left;  . MENISCUS REPAIR  2008  . VASCULAR SURGERY  2015     Current Meds  Medication Sig  . acetaminophen (TYLENOL) 500 MG tablet Take 500 mg by mouth daily as needed for fever or pain.   . bicalutamide (CASODEX) 50 MG tablet Take 50 mg by mouth daily.  . cyanocobalamin 1000 MCG tablet Take 1,000 mcg by mouth daily.  Marland Kitchen gabapentin (NEURONTIN) 600 MG tablet Take 600 mg by mouth 5 (five) times daily.   . metoprolol tartrate (LOPRESSOR) 50 MG tablet Take 1 tablet (50 mg total) by mouth 2 (two) times daily.  Marland Kitchen omeprazole (PRILOSEC) 40 MG capsule Take 40 mg by mouth daily.  . polyethylene glycol (MIRALAX) packet Take 17 g by mouth daily as needed for mild constipation or moderate constipation.   . simvastatin (ZOCOR) 20 MG tablet Take 20 mg by mouth at bedtime.   . tamsulosin (FLOMAX) 0.4 MG CAPS  capsule Take 0.4 mg by mouth daily.   . traMADol (ULTRAM) 50 MG tablet Take 50 mg by mouth every 6 (six) hours as needed for moderate pain.      Allergies:   Nsaids; Tolmetin; Celebrex [celecoxib]; Diltiazem hcl; Other; and Venlafaxine   Social History   Tobacco Use  . Smoking status: Former Research scientist (life sciences)  . Smokeless tobacco: Never Used  Substance Use Topics  . Alcohol use: No  . Drug use: No     Current Outpatient Medications on File Prior to Visit  Medication Sig Dispense Refill  . acetaminophen (TYLENOL) 500 MG tablet Take 500 mg by mouth daily as needed for fever or pain.     . bicalutamide (CASODEX) 50 MG tablet Take 50 mg by mouth daily.    . cyanocobalamin 1000 MCG tablet Take 1,000 mcg by mouth daily.    Marland Kitchen gabapentin (NEURONTIN) 600 MG tablet Take 600 mg by mouth 5 (five) times daily.     . metoprolol tartrate (LOPRESSOR) 50 MG tablet Take 1 tablet (50 mg total) by mouth 2 (two) times daily. 180 tablet 3  . omeprazole (PRILOSEC) 40 MG capsule Take 40 mg by mouth daily.    . polyethylene glycol  (MIRALAX) packet Take 17 g by mouth daily as needed for mild constipation or moderate constipation.     . simvastatin (ZOCOR) 20 MG tablet Take 20 mg by mouth at bedtime.     . tamsulosin (FLOMAX) 0.4 MG CAPS capsule Take 0.4 mg by mouth daily.     . traMADol (ULTRAM) 50 MG tablet Take 50 mg by mouth every 6 (six) hours as needed for moderate pain.      No current facility-administered medications on file prior to visit.      Family Hx: The patient's family history includes Cancer in his brother and father; Dementia in his mother.  ROS:   Please see the history of present illness.    Review of Systems  Constitutional: Positive for malaise/fatigue.  Respiratory: Negative.   Cardiovascular: Positive for palpitations.  Gastrointestinal: Negative.   Musculoskeletal: Negative.   Neurological: Negative.   Psychiatric/Behavioral: Negative.   All other systems reviewed and are negative.    Labs/Other Tests and Data Reviewed:    Recent Labs: 06/06/2018: TSH 2.430 08/05/2018: ALT 11 08/06/2018: BUN 16; Creatinine, Ser 0.89; Hemoglobin 11.4; Platelets 327; Potassium 3.7; Sodium 136   Recent Lipid Panel No results found for: CHOL, TRIG, HDL, CHOLHDL, LDLCALC, LDLDIRECT  Wt Readings from Last 3 Encounters:  08/05/18 179 lb 14.3 oz (81.6 kg)  07/01/18 199 lb 8 oz (90.5 kg)  06/08/18 184 lb 6.4 oz (83.6 kg)     Exam:    Vital Signs: Vital signs may also be detailed in the HPI There were no vitals taken for this visit.  Wt Readings from Last 3 Encounters:  08/05/18 179 lb 14.3 oz (81.6 kg)  07/01/18 199 lb 8 oz (90.5 kg)  06/08/18 184 lb 6.4 oz (83.6 kg)   Temp Readings from Last 3 Encounters:  08/10/18 98.5 F (36.9 C) (Oral)  06/08/18 98.2 F (36.8 C) (Oral)  05/07/16 98 F (36.7 C) (Oral)   BP Readings from Last 3 Encounters:  08/10/18 135/80  07/01/18 138/76  06/08/18 115/73   Pulse Readings from Last 3 Encounters:  08/10/18 75  07/01/18 87  06/08/18 65      136/77, Pulse 73, resp 16  Well nourished, well developed male in no acute distress. Constitutional:  oriented to person,  place, and time. No distress.  Head: Normocephalic and atraumatic.  Eyes:  no discharge. No scleral icterus.  Neck: Normal range of motion. Neck supple.  Pulmonary/Chest: No audible wheezing, no distress, appears comfortable Musculoskeletal: Normal range of motion.  no  tenderness or deformity.  Neurological:   Coordination normal. Full exam not performed Skin:  No rash Psychiatric:  normal mood and affect. behavior is normal. Thought content normal.    ASSESSMENT & PLAN:    Ryan flutter, unspecified type ([place]) Long discussion with him concerning his Ryan flutter management, whether to attempt restoration of normal sinus rhythm or continue rate control Discussed risk and benefit of anticoagulation, risk and benefit of cardioversion Some palpitations, otherwise asymptomatic but weaker after GI surgery Recommend to increase metoprolol tartrate up to 25 twice daily, he was only taking this once a day He is not inclined to proceed with cardioversion at this time as he is still recovering from surgeries Prior to GI surgery end of February 2020 he was feeling well, was in flutter at that time  Aortic atherosclerosis ([place]) On a statin  OSA on CPAP Managed by primary care  Acute blood loss anemia Anemia has resolved, recent lab work reviewed  Mixed hyperlipidemia On statin, goal LDL less than 70   COVID-19 Education: The signs and symptoms of COVID-19 were discussed with the patient and how to seek care for testing (follow up with PCP or arrange E-visit).  The importance of social distancing was discussed today.  Patient Risk:   After full review of this patients clinical status, I feel that they are at least moderate risk at this time.  Time:   Today, I have spent 45 minutes with the patient with telehealth technology discussing the cardiac and medical  problems/diagnoses detailed above   10 min spent reviewing the chart prior to patient visit today   Medication Adjustments/Labs and Tests Ordered: Current medicines are reviewed at length with the patient today.  Concerns regarding medicines are outlined above.   Tests Ordered: No tests ordered   Medication Changes: No changes made  Disposition: Follow-up in 6 months   Signed, Ida Rogue, MD  09/30/2018 10:24 AM    [place] [place] Office [address] [place] [address], [place], [place] [address]

## 2018-09-30 ENCOUNTER — Other Ambulatory Visit: Payer: Self-pay

## 2018-09-30 ENCOUNTER — Telehealth (INDEPENDENT_AMBULATORY_CARE_PROVIDER_SITE_OTHER): Payer: Medicare Other | Admitting: Cardiovascular Disease

## 2018-09-30 ENCOUNTER — Telehealth: Payer: Self-pay | Admitting: Cardiovascular Disease

## 2018-09-30 DIAGNOSIS — Z9989 Dependence on other enabling machines and devices: Secondary | ICD-10-CM

## 2018-09-30 DIAGNOSIS — K922 Gastrointestinal hemorrhage, unspecified: Secondary | ICD-10-CM

## 2018-09-30 DIAGNOSIS — I4892 Unspecified atrial flutter: Secondary | ICD-10-CM | POA: Insufficient documentation

## 2018-09-30 DIAGNOSIS — D62 Acute posthemorrhagic anemia: Secondary | ICD-10-CM | POA: Insufficient documentation

## 2018-09-30 DIAGNOSIS — Z7189 Other specified counseling: Secondary | ICD-10-CM | POA: Insufficient documentation

## 2018-09-30 DIAGNOSIS — I700 Atherosclerosis of aorta: Secondary | ICD-10-CM | POA: Diagnosis not present

## 2018-09-30 DIAGNOSIS — G4733 Obstructive sleep apnea (adult) (pediatric): Secondary | ICD-10-CM | POA: Insufficient documentation

## 2018-09-30 DIAGNOSIS — E782 Mixed hyperlipidemia: Secondary | ICD-10-CM

## 2018-09-30 MED ORDER — METOPROLOL TARTRATE 50 MG PO TABS: 50.0000 mg | ORAL_TABLET | Freq: Two times a day (BID) | ORAL | 3 refills | Status: DC

## 2018-09-30 NOTE — Telephone Encounter (Signed)
Please call reagarding Metoprolol doseage.

## 2018-09-30 NOTE — Telephone Encounter (Signed)
Spoke with patient to see if he had his insurance information for the [facility] because I do not see that this has ever been used. Entered refill for his metoprolol to [facility] and recommended that he please give [name] a call back if for some reason this does not work for him. He was appreciative for the call with no further questions at this time.

## 2018-09-30 NOTE — Telephone Encounter (Signed)
Spoke with the pt and his son who is a Engineer, drilling. Pt has a telemedicine today with Dr.Gollan. They are needing clarification on the pt Metoprolol. They understanding was that the pt Metoprolol was being changed to 25mg  bid. The notification they received from [facility] is that the Rx was sent in for 50mg  bid. Adv the pt and his son that a message will be sent to [facility] for clarification.

## 2018-09-30 NOTE — Patient Instructions (Addendum)
Medication Instructions:  Continue current medications. Metoprolol refill sent into [facility]   If you need a refill on your cardiac medications before your next appointment, please call your pharmacy.   Lab work: No new labs needed   If you have labs (blood work) drawn today and your tests are completely normal, you will receive your results only by: Marland Kitchen MyChart Message (if you have MyChart) OR . A paper copy in the mail If you have any lab test that is abnormal or we need to change your treatment, we will call you to review the results.   Testing/Procedures: No new testing needed   Follow-Up: At [facility], you and your health needs are our priority.  As part of our continuing mission to provide you with exceptional heart care, we have created designated Provider Care Teams.  These Care Teams include your primary Cardiologist (physician) and Advanced Practice Providers (APPs -  Physician Assistants and Nurse Practitioners) who all work together to provide you with the care you need, when you need it.  . You will need a follow up appointment in 3 months .   Please call our office 2 months in advance to schedule this appointment.    . Providers on your designated Care Team:   . Murray Hodgkins, NP . Christell Faith, PA-C . Marrianne Mood, PA-C  Any Other Special Instructions Will Be Listed Below (If Applicable).  For educational health videos Log in to : [email or web address] Or : [email or web address], password : [email or web address]

## 2018-10-01 NOTE — Telephone Encounter (Signed)
Please review because they said dosage was not correct. You placed on AVS:  Medication Instructions:  Refills on meds, 90 days, refills x3 [facility]  So I sent in refill for metoprolol 50 mg twice a day and they received notification and said that you had mentioned lower dose. Please let me know so I can get this sent to them.

## 2018-10-01 NOTE — Telephone Encounter (Signed)
Recommend to increase metoprolol tartrate up to 25 twice daily, he was only taking this once a day

## 2018-10-01 NOTE — Telephone Encounter (Signed)
Dr. Rockey [facility]- please advise- he may have the wrong dose of metoprolol coming to him??  Per his mediation list he was taking metoprolol tartrate 50 mg BID. At his visit yesterday, you mentioned: Long discussion with him concerning his atrial flutter management, whether to attempt restoration of normal sinus rhythm or continue rate control Discussed risk and benefit of anticoagulation, risk and benefit of cardioversion Some palpitations, otherwise asymptomatic but weaker after GI surgery Recommend to increase metoprolol tartrate up to 25 twice daily, he was only taking this once a day He is not inclined to proceed with cardioversion at this time as he is still recovering from surgeries

## 2018-10-01 NOTE — Telephone Encounter (Signed)
Patient calling to check on status  Says his [facility] shipped this morning and he is not sure it is the right MG Please call to discuss

## 2018-10-02 MED ORDER — METOPROLOL TARTRATE 25 MG PO TABS: 25.0000 mg | ORAL_TABLET | Freq: Two times a day (BID) | ORAL | 3 refills | Status: DC

## 2018-10-02 NOTE — Telephone Encounter (Signed)
Spoke with pharmacy and had them verify prescription. Discontinued metoprolol tartrate 50 mg and sent in new Metoprolol tartrate 25 mg one tablet twice a day. She confirmed this information with no further questions at this time.

## 2018-10-02 NOTE — Telephone Encounter (Signed)
Spoke with patient and he had been previously on 50 mg pills and was taking 1/2 tablet once daily. He had discussed with Dr. Rockey Situ on getting the 25 mg pill sent in so he did not need to cut the pill each day and on AVS it was just stated to refill for 90 days with 3 refills. Reviewed that I would resend updated prescription and call over to pharmacy to get this taken care of. He was very understanding even though it has been a hassle to cut them daily. Apologies given to him over this and he was appreciative for the call and information.

## 2018-10-02 NOTE — Telephone Encounter (Signed)
Spoke with patient and reviewed that I did reach out to his pharmacy to update the prescription information. He was very appreciative for the update and had no further questions at this time.

## 2019-01-16 DIAGNOSIS — C61 Malignant neoplasm of prostate: Secondary | ICD-10-CM

## 2019-01-21 ENCOUNTER — Telehealth
Admit: 2019-01-21 | Discharge: 2019-01-22 | Payer: MEDICARE | Attending: Hematology & Oncology | Primary: Hematology & Oncology

## 2019-01-21 ENCOUNTER — Ambulatory Visit: Admit: 2019-01-21 | Discharge: 2019-01-22 | Payer: MEDICARE

## 2019-01-21 DIAGNOSIS — C778 Secondary and unspecified malignant neoplasm of lymph nodes of multiple regions: Secondary | ICD-10-CM

## 2019-01-21 DIAGNOSIS — C61 Malignant neoplasm of prostate: Secondary | ICD-10-CM

## 2019-01-27 ENCOUNTER — Other Ambulatory Visit: Payer: Self-pay | Admitting: Internal Medicine

## 2019-01-27 DIAGNOSIS — R310 Gross hematuria: Secondary | ICD-10-CM

## 2019-02-06 ENCOUNTER — Other Ambulatory Visit: Payer: Self-pay

## 2019-02-06 ENCOUNTER — Ambulatory Visit
Admission: RE | Admit: 2019-02-06 | Discharge: 2019-02-06 | Disposition: A | Discharge status: Home / Self Care | Payer: Medicare Other | Source: Ambulatory Visit | Attending: Internal Medicine | Admitting: Internal Medicine

## 2019-02-06 DIAGNOSIS — R310 Gross hematuria: Secondary | ICD-10-CM | POA: Diagnosis not present

## 2019-03-19 ENCOUNTER — Other Ambulatory Visit: Payer: Self-pay

## 2019-03-19 ENCOUNTER — Encounter: Payer: Self-pay | Admitting: Urology

## 2019-03-19 ENCOUNTER — Ambulatory Visit (INDEPENDENT_AMBULATORY_CARE_PROVIDER_SITE_OTHER): Payer: Medicare Other | Admitting: Urology

## 2019-03-19 VITALS — BP 109/68 | HR 90 | Ht 71.0 in | Wt 182.5 lb

## 2019-03-19 DIAGNOSIS — C61 Malignant neoplasm of prostate: Secondary | ICD-10-CM | POA: Diagnosis not present

## 2019-03-19 DIAGNOSIS — R310 Gross hematuria: Secondary | ICD-10-CM | POA: Diagnosis not present

## 2019-03-19 DIAGNOSIS — N200 Calculus of kidney: Secondary | ICD-10-CM | POA: Diagnosis not present

## 2019-03-19 LAB — URINALYSIS, COMPLETE
Bilirubin, UA: NEGATIVE
Glucose, UA: NEGATIVE
Ketones, UA: NEGATIVE
Leukocytes,UA: NEGATIVE
Nitrite, UA: NEGATIVE
Protein,UA: NEGATIVE
Specific Gravity, UA: 1.02 (ref 1.005–1.030)
Urobilinogen, Ur: 0.2 mg/dL (ref 0.2–1.0)
pH, UA: 5 (ref 5.0–7.5)

## 2019-03-19 LAB — MICROSCOPIC EXAMINATION: Bacteria, UA: NONE SEEN

## 2019-03-19 NOTE — Progress Notes (Signed)
03/19/2019 9:16 AM   Ryan Allison Mar 24, 1941 [id number]  Referring provider: Adin Hector, MD [place] [facility] [place],  [place] [address]  Chief Complaint  Patient presents with  . Nephrolithiasis    HPI: Ryan Allison is a 78 y.o. male seen at the request of Dr. Caryl Comes for evaluation of hematuria.  He was seen at [facility] on 01/24/2019 with a 2-week history of intermittent gross hematuria.  He denied flank, abdominal or pelvic pain and noted slight decrease of his urinary stream.  Urinalysis at that visit showed 10-50 RBCs and urine culture was negative.  He had a recurrent episode earlier this week.  Dr. Caryl Comes ordered a noncontrast CT of the abdomen pelvis which showed nonobstructing right renal calculi.  Prior CTs with contrast in 2018 and 2019x2 showed nonobstructing right lower pole calculi.  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 Treated with EBRT plus six months of ADT (complicated by low energy). --In 06/2010, PSA <0.1 --In 11/2012, PSA 1.9 --In 07/2013, PSA 1.9 --In 08/2016, PSA 11.9. Bone scan and CT scan neg for met --In 09/2016, PET Axumin showed uptake in prostate, L pelvic side wal and aortocaval node. --In 12/2016, [employer] mutation profiling was negative for mutations. --In 05/2018, CT and bone scan neg for mets. PSA 33.6 with doubling time of 12 months. Bicalutamide started.  Most recent PSA results 0.15 Sep 2018 and September 2020.  PMH: Past Medical History:  Diagnosis Date  . Aortic atherosclerosis ([place])   . Arthritis   . Atrial flutter ([place])    a. diagnosed 05/2018; b. not on [place] 2/2 recurrent life threatening GI bleed; c. CHADS2VASc => 3 (age x 2, vascular disease)  . Cancer [facility]) 2011   prostate  . GI bleed   . History of echocardiogram    a. TTE 05/2018:  EF of 60-65%, normal wall motion, mild MR, left atrium normal in size, RVSF normal, PASP 33 mmHg, rhythm was atrial flutter  . History of GI diverticular bleed    extended stay in the [facility] ICU due to GI bleeding prior to 2016  . HLD (hyperlipidemia)   . Hyperlipidemia   . Hypertension   . Meniscus tear   . OSA on CPAP   . Peripheral neuropathy     Surgical History: Past Surgical History:  Procedure Laterality Date  . COLONOSCOPY Left 06/07/2018   Procedure: COLONOSCOPY;  Surgeon: Virgel Manifold, MD;  Location: [facility] ENDOSCOPY;  Service: Endoscopy;  Laterality: Left;  . COLONOSCOPY WITH PROPOFOL N/A 04/06/2015   Procedure: COLONOSCOPY WITH PROPOFOL;  Surgeon: Lollie Sails, MD;  Location: [facility] ENDOSCOPY;  Service: Endoscopy;  Laterality: N/A;  . COLONOSCOPY WITH PROPOFOL N/A 06/07/2018   Procedure: COLONOSCOPY WITH PROPOFOL;  Surgeon: Virgel Manifold, MD;  Location: [facility] ENDOSCOPY;  Service: Endoscopy;  Laterality: N/A;  . HERNIA REPAIR Left 09-23-14   inguinal  . INGUINAL HERNIA REPAIR Left 09/23/2014   Procedure: HERNIA REPAIR INGUINAL ADULT;  Surgeon: Christene Lye, MD;  Location: [facility] ORS;  Service: General;  Laterality: Left;  . MENISCUS REPAIR  2008  . VASCULAR SURGERY  2015    Home Medications:  Allergies as of 03/19/2019      Reactions   Nsaids Other (See Comments)   Diverticular bleed Diverticular bleed   Tolmetin    Other reaction(s): Other (  See Comments) Diverticular bleed Diverticular bleed   Celebrex [celecoxib] Itching, Rash   Diltiazem Hcl Rash   Other Other (See Comments)   Diverticular bleed   Venlafaxine Other (See Comments)   sedation sedation      Medication List       Accurate as of March 19, 2019  9:16 AM. If you have any questions, ask your nurse or doctor.        STOP taking these medications   MiraLax 17 g packet Generic drug: polyethylene glycol Stopped by: Abbie Sons, MD     TAKE these medications    acetaminophen 500 MG tablet Commonly known as: TYLENOL Take 500 mg by mouth daily as needed for fever or pain.   apixaban 5 MG Tabs tablet Commonly known as: ELIQUIS Take by mouth.   bicalutamide 50 MG tablet Commonly known as: CASODEX Take 50 mg by mouth daily.   cyanocobalamin 1000 MCG tablet Take 1,000 mcg by mouth daily.   gabapentin 600 MG tablet Commonly known as: NEURONTIN Take 600 mg by mouth 5 (five) times daily.   metoprolol tartrate 25 MG tablet Commonly known as: LOPRESSOR Take 1 tablet (25 mg total) by mouth 2 (two) times daily.   omeprazole 40 MG capsule Commonly known as: PRILOSEC Take 40 mg by mouth daily.   simvastatin 20 MG tablet Commonly known as: ZOCOR Take 20 mg by mouth at bedtime.   tamsulosin 0.4 MG Caps capsule Commonly known as: FLOMAX Take 0.4 mg by mouth daily.   traMADol 50 MG tablet Commonly known as: ULTRAM Take 50 mg by mouth every 6 (six) hours as needed for moderate pain.       Allergies:  Allergies  Allergen Reactions  . Nsaids Other (See Comments)    Diverticular bleed Diverticular bleed  . Tolmetin     Other reaction(s): Other (See Comments) Diverticular bleed Diverticular bleed  . Celebrex [Celecoxib] Itching and Rash  . Diltiazem Hcl Rash  . Other Other (See Comments)    Diverticular bleed  . Venlafaxine Other (See Comments)    sedation sedation    Family History: Family History  Problem Relation Age of Onset  . Dementia Mother   . Cancer Father   . Cancer Brother     Social History:  reports that he has quit smoking. He has never used smokeless tobacco. He reports that he does not drink alcohol or use drugs.  ROS: UROLOGY Frequent Urination?: No Hard to postpone urination?: No Burning/pain with urination?: No Get up at night to urinate?: No Leakage of urine?: No Urine stream starts and stops?: No Trouble starting stream?: Yes Do you have to strain to urinate?: No Blood in urine?: Yes Urinary  tract infection?: No Sexually transmitted disease?: No Injury to kidneys or bladder?: No Painful intercourse?: No Weak stream?: No Erection problems?: No Penile pain?: No  Gastrointestinal Nausea?: No Vomiting?: No Indigestion/heartburn?: No Diarrhea?: No Constipation?: No  Constitutional Fever: No Night sweats?: No Weight loss?: No Fatigue?: Yes  Skin Skin rash/lesions?: No Itching?: No  Eyes Blurred vision?: No Double vision?: No  Ears/Nose/Throat Sore throat?: No Sinus problems?: No  Hematologic/Lymphatic Swollen glands?: No Easy bruising?: No  Cardiovascular Leg swelling?: No Chest pain?: No  Respiratory Cough?: No Shortness of breath?: No  Endocrine Excessive thirst?: No  Musculoskeletal Back pain?: No Joint pain?: No  Neurological Headaches?: No Dizziness?: No  Psychologic Depression?: No Anxiety?: No  Physical Exam: BP 109/68 (BP Location: Left Arm, Patient Position: Sitting, Cuff  Size: Normal)   Pulse 90   Ht 5' 11"  (1.803 m)   Wt 182 lb 8 oz (82.8 kg)   BMI 25.45 kg/m   Constitutional:  Alert and oriented, No acute distress. HEENT: [place] AT, moist mucus membranes.  Trachea midline, no masses. Cardiovascular: No clubbing, cyanosis, or edema. Respiratory: Normal respiratory effort, no increased work of breathing. Skin: No rashes, bruises or suspicious lesions. Neurologic: Grossly intact, no focal deficits, moving all 4 extremities. Psychiatric: Normal mood and affect.  Laboratory Data:  Urinalysis Dipstick 1+ blood Microscopy negative  Pertinent Imaging: Noncontrast CT personally reviewed Results for orders placed during the hospital encounter of 02/06/19  CT RENAL STONE STUDY   Narrative CLINICAL DATA:  Hematuria.  History of prostate carcinoma  EXAM: CT ABDOMEN AND PELVIS WITHOUT CONTRAST  TECHNIQUE: Multidetector CT imaging of the abdomen and pelvis was performed following the standard protocol without oral or IV  contrast.  COMPARISON:  August 05, 2018  FINDINGS: Lower chest: There is bibasilar emphysematous change with interstitial fibrosis. No airspace consolidation noted in the lung bases. There is calcification in the mitral annulus region.  Hepatobiliary: No focal liver lesions are evident on this noncontrast enhanced study. Gallbladder wall is not appreciably thickened. There is no biliary duct dilatation.  Pancreas: There is no pancreatic mass or inflammatory focus.  Spleen: No splenic lesions are evident.  Adrenals/Urinary Tract: Adrenals bilaterally appear unremarkable. There is a cyst arising from the lower pole of the left kidney measuring 3.4 x 3.3 cm. There is no appreciable hydronephrosis on either side. There is a an apparent developing staghorn calculus in the lower pole of the right kidney measuring 1.2 x 1.0 cm. This calculus is quite irregular in contour with tiny adjacent calculi in this area. No intrarenal calculi on the left. There is no appreciable ureteral calculus on either side. Urinary bladder is midline. The wall of the urinary bladder is mildly thickened.  Stomach/Bowel: Ileostomy is again noted along the anterior wall to the right of midline in the upper pelvic region, unchanged. No associated hernia in this area. There is no appreciable bowel wall or mesenteric thickening. There is no evident bowel obstruction. There is no free air or portal venous air.  Vascular/Lymphatic: There is no abdominal aortic aneurysm. There is extensive aortic atherosclerotic calcification. No adenopathy is evident in the abdomen or pelvis.  Reproductive: Postoperative changes noted in the prostate. Question clips versus seed implants. Prostate and seminal vesicles are normal in size and contour. No pelvic mass evident.  Other: There is scarring with areas of fat in the periumbilical region, stable. No abnormal fluid in this area. Postoperative change in left inguinal region  noted. No abscess or ascites evident in the abdomen or pelvis.  Musculoskeletal: There are foci of degenerative change in the lower thoracic and lumbar regions. There are no blastic or lytic bone lesions. No intramuscular lesions are evident.  IMPRESSION: 1. 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 No hydronephrosis on either side. No ureteral calculi on either side.  2. Wall thickening of the urinary bladder, a finding likely indicative of a degree of cystitis.  3. 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 No evident bowel obstruction or bowel wall thickening currently. No abscess in the abdomen or pelvis.  4. Postoperative changes in the periumbilical and left inguinal regions. Postoperative change in the prostate region. Stable appearance in these areas.  5. 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 No  airspace consolidation in the lung bases.  6.  Aortic Atherosclerosis (ICD10-I70.0).   Electronically Signed   By: Lowella Grip III M.D.   On: 02/06/2019 10:45     Assessment & Plan:    -Gross hematuria Risk stratification: High risk We discussed standard recommendation is a CT urogram and cystoscopy.  Dr. Caryl Comes ordered a noncontrast CT of the abdomen pelvis.  He did have contrast CT scans in 2019.  We discussed the slight increased risk of urothelial carcinoma after radiation therapy for prostate cancer and I stressed the need for lower tract evaluation with cystoscopy.  I recommended we do this as soon as possible however he wanted to hold off until after the first of the year.  He indicated he was willing to accept the risk of delayed diagnosis of urothelial carcinoma.  - Nephrolithiasis Nonobstructing right lower pole calculi which have been present since at least 2018.  We discussed treatment options of shockwave lithotripsy, ureteroscopic removal and surveillance.  He has  elected surveillance for now.   Abbie Sons, [place] [address], [place], [place] [address] [phone]

## 2019-06-13 DIAGNOSIS — C61 Malignant neoplasm of prostate: Principal | ICD-10-CM

## 2019-06-13 MED ORDER — BICALUTAMIDE 50 MG TABLET
ORAL_TABLET | Freq: Every day | ORAL | 11 refills | 30.00000 days | Status: CP
Start: 2019-06-13 — End: ?

## 2019-07-10 ENCOUNTER — Ambulatory Visit: Payer: Medicare Other | Attending: Internal Medicine

## 2019-07-10 DIAGNOSIS — Z23 Encounter for immunization: Secondary | ICD-10-CM | POA: Insufficient documentation

## 2019-07-10 NOTE — Progress Notes (Signed)
   Covid-19 Vaccination Clinic  Name:  Ryan Allison    MRN: [id number] DOB: 1940-07-01  07/10/2019  [name] was observed post Covid-19 immunization for 15 minutes without incidence. He was provided with Vaccine Information Sheet and instruction to access the V-Safe system.   [name] was instructed to call 911 with any severe reactions post vaccine: Marland Kitchen Difficulty breathing  . Swelling of your face and throat  . A fast heartbeat  . A bad rash all over your body  . Dizziness and weakness    Immunizations Administered    Name Date Dose VIS Date Route   Pfizer COVID-19 Vaccine 07/10/2019 11:31 AM 0.3 mL 04/25/2019 Intramuscular   Manufacturer: [place]   Lot: [id number]   [place]: [id number]

## 2019-07-28 DIAGNOSIS — C61 Malignant neoplasm of prostate: Principal | ICD-10-CM

## 2019-07-28 DIAGNOSIS — Z125 Encounter for screening for malignant neoplasm of prostate: Principal | ICD-10-CM

## 2019-08-05 ENCOUNTER — Ambulatory Visit: Payer: Medicare Other | Attending: Internal Medicine

## 2019-08-05 DIAGNOSIS — Z23 Encounter for immunization: Secondary | ICD-10-CM

## 2019-08-05 NOTE — Progress Notes (Signed)
   Covid-19 Vaccination Clinic  Name:  Ryan Allison    MRN: [id number] DOB: Feb 13, 1941  08/05/2019  [name] was observed post Covid-19 immunization for 15 minutes without incident. He was provided with Vaccine Information Sheet and instruction to access the V-Safe system.   [name] was instructed to call 911 with any severe reactions post vaccine: Marland Kitchen Difficulty breathing  . Swelling of face and throat  . A fast heartbeat  . A bad rash all over body  . Dizziness and weakness   Immunizations Administered    Name Date Dose VIS Date Route   Pfizer COVID-19 Vaccine 08/05/2019 11:10 AM 0.3 mL 04/25/2019 Intramuscular   Manufacturer: [employer]   Lot: [id number]   [place]: [id number]

## 2019-08-12 ENCOUNTER — Telehealth
Admit: 2019-08-12 | Discharge: 2019-08-13 | Payer: MEDICARE | Attending: Hematology & Oncology | Primary: Hematology & Oncology

## 2019-09-02 ENCOUNTER — Ambulatory Visit
Admit: 2019-09-02 | Discharge: 2019-09-03 | Payer: MEDICARE | Attending: Hematology & Oncology | Primary: Hematology & Oncology

## 2019-09-02 DIAGNOSIS — C778 Secondary and unspecified malignant neoplasm of lymph nodes of multiple regions: Principal | ICD-10-CM

## 2019-09-02 DIAGNOSIS — C61 Malignant neoplasm of prostate: Principal | ICD-10-CM

## 2019-09-09 ENCOUNTER — Other Ambulatory Visit: Payer: Self-pay | Admitting: Cardiovascular Disease

## 2019-09-09 NOTE — Telephone Encounter (Signed)
Left voicemail message requesting to have patient call back to schedule.

## 2019-09-09 NOTE — Telephone Encounter (Signed)
Please schedule overdue F/U with Dr. Gollan. Thank you! 

## 2019-09-10 NOTE — Telephone Encounter (Signed)
Attempted to schedule.  

## 2019-09-11 ENCOUNTER — Encounter: Payer: Self-pay | Admitting: Family

## 2019-09-11 ENCOUNTER — Ambulatory Visit (INDEPENDENT_AMBULATORY_CARE_PROVIDER_SITE_OTHER): Payer: Medicare Other | Admitting: Family

## 2019-09-11 ENCOUNTER — Other Ambulatory Visit: Payer: Self-pay

## 2019-09-11 VITALS — BP 110/60 | HR 65 | Ht 71.0 in | Wt 175.3 lb

## 2019-09-11 DIAGNOSIS — I4892 Unspecified atrial flutter: Secondary | ICD-10-CM

## 2019-09-11 DIAGNOSIS — E782 Mixed hyperlipidemia: Secondary | ICD-10-CM

## 2019-09-11 DIAGNOSIS — G4733 Obstructive sleep apnea (adult) (pediatric): Secondary | ICD-10-CM | POA: Diagnosis not present

## 2019-09-11 DIAGNOSIS — Z9989 Dependence on other enabling machines and devices: Secondary | ICD-10-CM

## 2019-09-11 DIAGNOSIS — I700 Atherosclerosis of aorta: Secondary | ICD-10-CM | POA: Diagnosis not present

## 2019-09-11 MED ORDER — METOPROLOL TARTRATE 25 MG PO TABS: ORAL_TABLET | ORAL | 3 refills | Status: DC

## 2019-09-11 NOTE — Patient Instructions (Addendum)
Medication Instructions:  Your physician has recommended you make the following change in your medication:   CHANGE Metorpolol to 12.5 mg (half tablet) twice daily - once in the morning and once at bedtime.   CONTINUE Eliquis 5mg  (one tablet) twice daily. It is important to make sure you don't miss a dose of the Eliquis as it is very important if we decide to have a cardioversion.   *If you need a refill on your cardiac medications before your next appointment, please call your pharmacy*  Lab Work: No lab work today.   Testing/Procedures: Your EKG today shows rate controlled atrial flutter with a rate of 75 bpm. This means that there are extra electrical signals in the top chambers of your heart. This could be contributing to your fatigue and shortness of breath.   Your physician has requested that you have an echocardiogram (ok if not done prior to follow up at this time) . Echocardiography is a painless test that uses sound waves to create images of your heart. It provides your doctor with information about the size and shape of your heart and how well your heart's chambers and valves are working. This procedure takes approximately one hour. There are no restrictions for this procedure.  Follow-Up: At [facility], you and your health needs are our priority.  As part of our continuing mission to provide you with exceptional heart care, we have created designated Provider Care Teams.  These Care Teams include your primary Cardiologist (physician) and Advanced Practice Providers (APPs -  Physician Assistants and Nurse Practitioners) who all work together to provide you with the care you need, when you need it.  We recommend signing up for the patient portal called "MyChart".  Sign up information is provided on this After Visit Summary.  MyChart is used to connect with patients for Virtual Visits (Telemedicine).  Patients are able to view lab/test results, encounter notes, upcoming appointments,  etc.  Non-urgent messages can be sent to your provider as well.   To learn more about what you can do with MyChart, go to [email or web address].    Your next appointment:   4 week(s)  The format for your next appointment:   In Person  Provider:   Ida Rogue, MD (preferred per Laurann Montana, NP)   Other Instructions   Hopefully reducing your dose of metoprolol will keep the medication in your system throughout the whole day.    Recommend staying adequately hydrated. Be sure to drink lots of fluids throughout the day.    Call the office if you decide you would like [name] to set you up for a Cardioversion to be done.   Electrical Cardioversion Electrical cardioversion is the delivery of a jolt of electricity to restore a normal rhythm to the heart. A rhythm that is too fast or is not regular keeps the heart from pumping well. In this procedure, sticky patches or metal paddles are placed on the chest to deliver electricity to the heart from a device. This procedure may be done in an emergency if:  There is low or no blood pressure as a result of the heart rhythm.  Normal rhythm must be restored as fast as possible to protect the brain and heart from further damage.  It may save a life. This may also be a scheduled procedure for irregular or fast heart rhythms that are not immediately life-threatening. Tell a health care provider about:  Any allergies you have.  All medicines you are taking, including  vitamins, herbs, eye drops, creams, and over-the-counter medicines.  Any problems you or family members have had with anesthetic medicines.  Any blood disorders you have.  Any surgeries you have had.  Any medical conditions you have.  Whether you are pregnant or may be pregnant. What are the risks? Generally, this is a safe procedure. However, problems may occur, including:  Allergic reactions to medicines.  A blood clot that breaks free and travels to other parts of  your body.  The possible return of an abnormal heart rhythm within hours or days after the procedure.  Your heart stopping (cardiac arrest). This is rare. What happens before the procedure? Medicines  Your health care provider may have you start taking: ? Blood-thinning medicines (anticoagulants) so your blood does not clot as easily. ? Medicines to help stabilize your heart rate and rhythm.  Ask your health care provider about: ? Changing or stopping your regular medicines. This is especially important if you are taking diabetes medicines or blood thinners. ? Taking medicines such as aspirin and ibuprofen. These medicines can thin your blood. Do not take these medicines unless your health care provider tells you to take them. ? Taking over-the-counter medicines, vitamins, herbs, and supplements. General instructions  Follow instructions from your health care provider about eating or drinking restrictions.  Plan to have someone take you home from the hospital or clinic.  If you will be going home right after the procedure, plan to have someone with you for 24 hours.  Ask your health care provider what steps will be taken to help prevent infection. These may include washing your skin with a germ-killing soap. What happens during the procedure?   An IV will be inserted into one of your veins.  Sticky patches (electrodes) or metal paddles may be placed on your chest.  You will be given a medicine to help you relax (sedative).  An electrical shock will be delivered. The procedure may vary among health care providers and hospitals. What can I expect after the procedure?  Your blood pressure, heart rate, breathing rate, and blood oxygen level will be monitored until you leave the hospital or clinic.  Your heart rhythm will be watched to make sure it does not change.  You may have some redness on the skin where the shocks were given. Follow these instructions at home:  Do not  drive for 24 hours if you were given a sedative during your procedure.  Take over-the-counter and prescription medicines only as told by your health care provider.  Ask your health care provider how to check your pulse. Check it often.  Rest for 48 hours after the procedure or as told by your health care provider.  Avoid or limit your caffeine use as told by your health care provider.  Keep all follow-up visits as told by your health care provider. This is important. Contact a health care provider if:  You feel like your heart is beating too quickly or your pulse is not regular.  You have a serious muscle cramp that does not go away. Get help right away if:  You have discomfort in your chest.  You are dizzy or you feel faint.  You have trouble breathing or you are short of breath.  Your speech is slurred.  You have trouble moving an arm or leg on one side of your body.  Your fingers or toes turn cold or blue. Summary  Electrical cardioversion is the delivery of a jolt of electricity  to restore a normal rhythm to the heart.  This procedure may be done right away in an emergency or may be a scheduled procedure if the condition is not an emergency.  Generally, this is a safe procedure.  After the procedure, check your pulse often as told by your health care provider. This information is not intended to replace advice given to you by your health care provider. Make sure you discuss any questions you have with your health care provider. Document Revised: 12/02/2018 Document Reviewed: 12/02/2018 Elsevier Patient Education  [place].   Atrial Flutter  Atrial flutter is a type of abnormal heart rhythm (arrhythmia). The heart has an electrical system that tells it how to beat. In atrial flutter, the signals move rapidly in the top chambers of the heart (the atria). This makes your heart beat very fast. Atrial flutter can come and go, or it can be permanent. The goal of  treatment is to prevent blood clots from forming, control your heart rate, or restore your heartbeat to a normal rhythm. If this condition is not treated, it can cause serious problems, such as a weakened heart muscle (cardiomyopathy) or a stroke. What are the causes? This condition is often caused by conditions that damage the heart's electrical system. These include:  Heart conditions and heart surgery. These include heart attacks and open-heart surgery.  Lung problems, such as COPD or a blood clot in the lung (pulmonary embolism, or PE).  Poorly controlled high blood pressure (hypertension).  Overactive thyroid (hyperthyroidism).  Diabetes. In some cases, the cause of this condition is not known. What increases the risk? You are more likely to develop this condition if:  You are an elderly adult.  You are a man.  You are overweight (obese).  You have obstructive sleep apnea.  You have a family history of atrial flutter.  You have diabetes.  You drink a lot of alcohol, especially binge drinking.  You use drugs, including cannabis.  You smoke. What are the signs or symptoms? Symptoms of this condition include:  A feeling that your heart is pounding or racing (palpitations).  Shortness of breath.  Chest pain.  Feeling dizzy or light-headed.  Fainting.  Low blood pressure (hypotension).  Fatigue.  Tiring easily during exercise or activity. In some cases, there are no symptoms. How is this diagnosed? This condition may be diagnosed with:  An electrocardiogram (ECG) to check electrical signals of the heart.  An ambulatory cardiac monitor to record your heart's activity for a few days.  An echocardiogram to create pictures of your heart.  A transesophageal echocardiogram (TEE) to create even better pictures of your heart.  A stress test to check your blood supply while you exercise.  Imaging tests, such as a CT scan or chest X-ray.  Blood tests. How is  this treated? Treatment depends on underlying conditions and how you feel when you experience atrial flutter. This condition may be treated with:  Medicines to prevent blood clots or to treat heart rate or heart rhythm problems.  Electrical cardioversion to reset the heart's rhythm.  Ablation to remove the heart tissue that sends abnormal signals.  Left atrial appendage closure to seal the area where blood clots can form. In some cases, underlying conditions will be treated. Follow these instructions at home: Medicines  Take over-the-counter and prescription medicines only as told by your health care provider.  Do not take any new medicines without talking to your health care provider.  If you  are taking blood thinners: ? Talk with your health care provider before you take any medicines that contain aspirin or NSAIDs, such as ibuprofen. These medicines increase your risk for dangerous bleeding. ? Take your medicine exactly as told, at the same time every day. ? Avoid activities that could cause injury or bruising, and follow instructions about how to prevent falls. ? Wear a medical alert bracelet or carry a card that lists what medicines you take. Lifestyle  Eat heart-healthy foods. Talk with a dietitian to make an eating plan that is right for you.  Do not use any products that contain nicotine or tobacco, such as cigarettes, e-cigarettes, and chewing tobacco. If you need help quitting, ask your health care provider.  Do not drink alcohol.  Do not use drugs, including cannabis.  Lose weight if you are overweight or obese.  Exercise regularly as instructed by your health care provider. General instructions  Do not use diet pills unless your health care provider approves. Diet pills may make heart problems worse.  If you have obstructive sleep apnea, manage your condition as told by your health care provider.  Keep all follow-up visits as told by your health care provider.  This is important. Contact a health care provider if you:  Notice a change in the rate, rhythm, or strength of your heartbeat.  Are taking a blood thinner and you notice more bruising.  Have a sudden change in weight.  Tire more easily when you exercise or do heavy work. Get help right away if you have:  Pain or pressure in your chest.  Shortness of breath.  Fainting.  Increasing sweating with no known cause.  Side effects of blood thinners, such as blood in your vomit, stool, or urine, or bleeding that cannot stop.  Any symptoms of a stroke. "BE FAST" is an easy way to remember the main warning signs of a stroke: ? B - Balance. Signs are dizziness, sudden trouble walking, or loss of balance. ? E - Eyes. Signs are trouble seeing or a sudden change in vision. ? F - Face. Signs are sudden weakness or numbness of the face, or the face or eyelid drooping on one side. ? A - Arms. Signs are weakness or numbness in an arm. This happens suddenly and usually on one side of the body. ? S - Speech. Signs are sudden trouble speaking, slurred speech, or trouble understanding what people say. ? T - Time. Time to call emergency services. Write down what time symptoms started.  Other signs of a stroke, such as: ? A sudden, severe headache with no known cause. ? Nausea or vomiting. ? Seizure.  These symptoms may represent a serious problem that is an emergency. Do not wait to see if the symptoms will go away. Get medical help right away. Call your local emergency services (911 in the [place] Do not drive yourself to the hospital. Summary  Atrial flutter is an abnormal heart rhythm that can give you symptoms of palpitations, shortness of breath, or fatigue.  Atrial flutter is often treated with medicines to keep your heart in a normal rhythm and to prevent a stroke.  Get help right away if you cannot catch your breath, or have chest pain or pressure.  Get help right away if you have signs or  symptoms of a stroke. This information is not intended to replace advice given to you by your health care provider. Make sure you discuss any questions you have with your health  care provider. Document Revised: 10/23/2018 Document Reviewed: 10/23/2018 Elsevier Patient Education  [place].

## 2019-09-11 NOTE — Progress Notes (Signed)
Office Visit    Patient Name: Ryan Allison Date of Encounter: 09/11/2019  Primary Care Provider:  Adin Hector, MD Primary Cardiologist:  No primary care provider on file. Electrophysiologist:  None   Chief Complaint    Ryan Allison is a 79 y.o. male with a hx of atrial flutter of uncertain chronicity January 2020 notable dose anticoagulation secondary to prior life-threatening GI bleeds, recurrent prostate cancer, aortic atherosclerosis, HLD, OSA on CPAP, diverticulosis, peripheral neuropathy, prior tobacco abuse, GERD.  He presents today for follow-up of atrial flutter  Past Medical History    Past Medical History:  Diagnosis Date  . Aortic atherosclerosis ([place])   . Arthritis   . Atrial flutter ([place])    a. diagnosed 05/2018; b. not on [place] 2/2 recurrent life threatening GI bleed; c. CHADS2VASc => 3 (age x 2, vascular disease)  . Cancer [facility]) 2011   prostate  . GI bleed   . History of echocardiogram    a. TTE 05/2018: EF of 60-65%, normal wall motion, mild MR, left atrium normal in size, RVSF normal, PASP 33 mmHg, rhythm was atrial flutter  . History of GI diverticular bleed    extended stay in the [facility] ICU due to GI bleeding prior to 2016  . HLD (hyperlipidemia)   . Hyperlipidemia   . Hypertension   . Meniscus tear   . OSA on CPAP   . Peripheral neuropathy    Past Surgical History:  Procedure Laterality Date  . COLONOSCOPY Left 06/07/2018   Procedure: COLONOSCOPY;  Surgeon: Virgel Manifold, MD;  Location: [facility] ENDOSCOPY;  Service: Endoscopy;  Laterality: Left;  . COLONOSCOPY WITH PROPOFOL N/A 04/06/2015   Procedure: COLONOSCOPY WITH PROPOFOL;  Surgeon: Lollie Sails, MD;  Location: [facility] ENDOSCOPY;  Service: Endoscopy;  Laterality: N/A;  . COLONOSCOPY WITH PROPOFOL N/A 06/07/2018   Procedure: COLONOSCOPY WITH PROPOFOL;  Surgeon: Virgel Manifold, MD;  Location: [facility] ENDOSCOPY;  Service: Endoscopy;  Laterality: N/A;  . HERNIA REPAIR Left 09-23-14   inguinal  . INGUINAL HERNIA REPAIR Left 09/23/2014   Procedure: HERNIA REPAIR INGUINAL ADULT;  Surgeon: Christene Lye, MD;  Location: [facility] ORS;  Service: General;  Laterality: Left;  . MENISCUS REPAIR  2008  . VASCULAR SURGERY  2015    Allergies  Allergies  Allergen Reactions  . Nsaids Other (See Comments)    Diverticular bleed Diverticular bleed  . Tolmetin     Other reaction(s): Other (See Comments) Diverticular bleed Diverticular bleed  . Celebrex [Celecoxib] Itching and Rash  . Diltiazem Hcl Rash  . Other Other (See Comments)    Diverticular bleed  . Venlafaxine Other (See Comments)    sedation sedation    History of Present Illness    Ryan Allison is a 79 y.o. male with a hx of atrial flutter of uncertain chronicity January 2020 notable dose anticoagulation secondary to prior life-threatening GI bleeds, recurrent prostate cancer, aortic atherosclerosis, HLD, OSA on CPAP, diverticulosis, peripheral neuropathy, prior tobacco abuse, GERD. He was last seen via telemedicine 09/30/2018.  Seen at [facility] 06/06/2018 following 2 episodes of pain decreased shortness of breath palpitations found to be in atrial flutter with RVR.  He was given transfusion of PRBC secondary to blood loss.  Colonoscopy 06/07/2018 with evidence of diverticular bleed and recommended to follow-up with colorectal surgeon for possible colectomy due to recurrent diverticular bleed.  Echo 06/07/2018 with LVEF 60-65%, no R WMA, mild MR, LA normal size, RV SF normal, PASP 33  mmHg.  He was seen by primary care after hospitalization with rash.  He had self discontinued Cardizem with some improvement in rash.  He was placed on metoprolol for rate control.  He underwent colectomy at [facility] for diverticuli and GI bleeding 06/2818.  He did have a post operative SBO requiring hospitalization at [facility] for 1 week.  He was started on Eliquis 5 mg twice daily with no recurrent GI bleeding.  A lot of office visit he was  recommended to take his metoprolol twice per day as prescribed as he was only taking it once per day.  He was not interested in cardioversion at the time as he was still recovering from surgeries.  Labs via care everywhere 08/28/19  Hemoglobin 15.2  K4.8, BUN 23, creatinine 1.0, GFR 73, AST 17, ALT 13  TSH 1.911  Seen by PCP recently with progression of baseline fatigue and dyspnea on exertion. He had CXR and PFT which are unfortunately unavailable for review in Care Everywhere. Tells me the nurse told him he had a "lung that isn't working right".   Tells me he "just doesn't have no energy" and "doesn't have any air". Reports no stamina or energy. Tells me his "heart doesn't seem like its right". Tells me his heart will "hit hard" or "hit a bit harder than it should".  Tells me he gets intermittently dizzy. He is very concerned that there is something wrong with his heart.   Takes his Metoprolol once per day between 7am and 8am. Does take Eliquis twice per day. Denies bleeding complications.   When asked to describe his dyspnea he tells me get out of air and feels like he is going to faint. Reports no chest pain, pressure, tightness. Reports no orthopnea, PND, LE edema.   EKGs/Labs/Other Studies Reviewed:   The following studies were reviewed today:  Echo 05/2018 - Left ventricle: The cavity size was normal. Systolic function was   normal. The estimated ejection fraction was in the range of 60%   to 65%. Wall motion was normal; there were no regional wall   motion abnormalities. - Mitral valve: There was mild regurgitation. - Left atrium: The atrium was normal in size. - Right ventricle: Systolic function was normal. - Pulmonary arteries: Systolic pressure was within the normal   range. PA peak pressure: 33 mm Hg (S).   Impressions: - Rhythm is atrial flutter.  EKG:  EKG is ordered today.  The ekg ordered today demonstrates Atrial flutter with variable AV block 70 bmp with  incomplete RBBB.  Recent Labs: No results found for requested labs within last 8760 hours.  Recent Lipid Panel No results found for: CHOL, TRIG, HDL, CHOLHDL, VLDL, LDLCALC, LDLDIRECT  Home Medications   Current Meds  Medication Sig  . acetaminophen (TYLENOL) 500 MG tablet Take 500 mg by mouth daily as needed for fever or pain.   Marland Kitchen apixaban (ELIQUIS) 5 MG TABS tablet Take 5 mg by mouth 2 (two) times daily.   . bicalutamide (CASODEX) 50 MG tablet Take 50 mg by mouth daily.  . cyanocobalamin 1000 MCG tablet Take 1,000 mcg by mouth daily.  Marland Kitchen gabapentin (NEURONTIN) 600 MG tablet Take 600 mg by mouth 5 (five) times daily.   . metoprolol tartrate (LOPRESSOR) 25 MG tablet Take 0.5 tablet (12.5 mg) by mouth twice daily  . omeprazole (PRILOSEC) 40 MG capsule Take 40 mg by mouth daily.  . simvastatin (ZOCOR) 20 MG tablet Take 20 mg by mouth at bedtime.   Marland Kitchen  tamsulosin (FLOMAX) 0.4 MG CAPS capsule Take 0.4 mg by mouth daily.   . traMADol (ULTRAM) 50 MG tablet Take 50 mg by mouth every 6 (six) hours as needed for moderate pain.   . [DISCONTINUED] metoprolol tartrate (LOPRESSOR) 25 MG tablet Take 0.5 tablet (12.5 mg) by mouth twice daily   . [DISCONTINUED] metoprolol tartrate (LOPRESSOR) 25 MG tablet Take 0.5 tablet (12.5 mg) by mouth twice daily    Review of Systems      Review of Systems  Constitution: Positive for malaise/fatigue. Negative for chills and fever.  Cardiovascular: Positive for dyspnea on exertion, irregular heartbeat and palpitations. Negative for chest pain, leg swelling, near-syncope, orthopnea and syncope.  Respiratory: Negative for cough, shortness of breath and wheezing.   Gastrointestinal: Negative for melena, nausea and vomiting.  Genitourinary: Negative for hematuria.  Neurological: Positive for light-headedness. Negative for dizziness and weakness.   All other systems reviewed and are otherwise negative except as noted above.  Physical Exam    VS:  BP 110/60 (BP  Location: Left Arm, Patient Position: Sitting, Cuff Size: Normal)   Pulse 65   Ht 5\' 11"  (1.803 m)   Wt 175 lb 5 oz (79.5 kg)   SpO2 99%   BMI 24.45 kg/m  , BMI Body mass index is 24.45 kg/m. GEN: Well nourished, well developed, in no acute distress. HEENT: normal. Neck: Supple, no JVD, carotid bruits, or masses. Cardiac: RRR, no murmurs, rubs, or gallops. No clubbing, cyanosis, edema.  Radials/DP/PT 2+ and equal bilaterally.  Respiratory:  Respirations regular and unlabored, clear to auscultation bilaterally. GI: Soft, nontender, nondistended, BS + x 4. MS: No deformity or atrophy. Skin: Warm and dry, no rash. Neuro:  Strength and sensation are intact. Psych: Normal affect.  Assessment & Plan    1. Atrial flutter - Onset 05/2018, likely has been in atrial flutter since that time. Was recommended for cardioversion at last visit, but declined. Reports for the last year he has felt fatigued, dyspneic on exertion, and overall unwell. Discussed that atrial flutter could be contributory.   Echo to reassess atria size.  Decrease Metoprolol Tartrate to 12.5mg  (half tablet) and take twice per day. Currently taking 25mg  daily - we discussed that uncontrolled HR when not on medication may be contributory to symptoms. Reduce dose as he reports when he previously took 25mg  BID he felt poorly.   Strongly encouraged cardioversion. Procedure risks and benefits reviewed in detail. Provided with written education. He is understandably hesitant and will call our office if he would like to schedule.   2. DOE - Likely multifactorial deconditioning, atrial flutter. Echo 06/07/18 LVEF 60-65%, mild MR, LA normal. Recent CXR by PCP which he tells me he was told "one lung not working right" but results unavailable in Care Everywhere. Had PFTs recently and preliminary review of paper report he brings to me appears normal, but await formal read. Update echocardiogram to rule out HF or valvular abnormality as  contributory. Will also allow for reassessment of atria size.  3. Chronic anticoagualtion - Secondary to atrial flutter. Denies bleeding complications. Continue Eliquis 5mg  BID. Denies missed doses - discussed importance of not missing a dose if plan to proceed with cardioversion.   4. Aortic atherosclerosis - No chest pain, pressure, tightness. No indication for ischemic evaluation at this time.   5. OSA on CPAP - Tells me he no longer wears CPAP. Unclear whether he self-discontinued or was recommended to stop wearing.   6. HLD - Lipid profile 04/2019  with LDL 61. Continue Simvastatin.   Disposition: Follow up in 4 week(s) with Dr. Rockey Situ or APP.    Loel Dubonnet, NP 09/11/2019, 10:28 PM

## 2019-09-12 ENCOUNTER — Telehealth: Payer: Self-pay | Admitting: Family

## 2019-09-12 NOTE — Telephone Encounter (Signed)
Tells me he feels poorly and he doesn't "know where this is coming from". Tells me he was winded and dizzy this morning with some improvement.   Tells me he ate some crackers and chicken salad. He has had orange juice and some other things to drink. Encouraged to drink lots of fluids today and make position changes carefully.  Tells me he doesn't feel the Metoprolol doesn't make him feel as well as it should. He is very interested in getting off the Metoprolol. We discussed that Metoprolol will help keep his heart rate controlled but will not prevent any fatigue he might feel from atrial flutter.   His son lives in [place]. He discussed the cardioversion with him last night. He is leaning more towards cardioversion, but does not wish to schedule at this time.   HR at home when checked today 70 - 91 bpm. Vitals while on the phone with me HR 91 bpm BP 125/66. We discussed that worry or anxiety could increase his heart rate and shortness of breath. He was not audibly short of breath during our phone call.   Discussed that as we changed his Metoprolol tartrate from 25mg  daily to 12.5mg  BID his heart rate may be up a bit higher than he is used to. Dose was reduced to promote BID dosing and prevent lightheadedness or dizziness. Discussed that he may take an additional half tablet of Metoprolol tartrate as needed this afternoon if his heart rate is persistently in the 90s and SBP >110. We discussed that we may eventually need to increase to 25mg  in the AM and 12.5mg  in the PM and he is agreeable to monitor his symptoms and heart rate closely over the weekend.  Reviewed that if he has shortness of breath that is worsening may need to present to the emergency department. Reassurance provided that his lung sounds were clear on exam yesterday and anxiety, atrial flutter are likely contributory to shortness of breath. Education provided on pursed lip breathing.   He was offered appointment Monday. Tells me he  will call us Monday to let us know how he feels. Encouraged to call after hours number as needed this weekend.   Loel Dubonnet, NP

## 2019-09-12 NOTE — Telephone Encounter (Signed)
Pt c/o medication issue:  1. Name of Medication: metoprolol   2. How are you currently taking this medication (dosage and times per day)? New change to 12.5 mg po bid   3. Are you having a reaction (difficulty breathing--STAT)? Dizziness increased and  breathing labored   4. What is your medication issue? Patient doesn't feel right about recent med changes  BP today 106/69 HR 96 on exertion

## 2019-09-12 NOTE — Telephone Encounter (Signed)
Call to patient to discuss medication change after OV yesterday with [facility] walker, NP.   Pt feels more breathless today then yesterday. HR was 97 this morning, now in the 70s.   Pt is closer to deciding that he will go ahead with DCCV but wants to speak to provider first. His goal is to d/c metoprolol after cardioversion.   I attempted to look at appt for next week and patient said "its not that bad, I just feel I need to make a plan".   Pt spoke in full sentences, no audible distress noted.   Routing to [employer] to further advise.

## 2019-09-19 ENCOUNTER — Other Ambulatory Visit: Payer: Self-pay

## 2019-09-19 ENCOUNTER — Ambulatory Visit (INDEPENDENT_AMBULATORY_CARE_PROVIDER_SITE_OTHER): Payer: Medicare Other

## 2019-09-19 DIAGNOSIS — I4892 Unspecified atrial flutter: Secondary | ICD-10-CM | POA: Diagnosis not present

## 2019-09-22 ENCOUNTER — Telehealth: Payer: Self-pay | Admitting: Family

## 2019-09-22 MED ORDER — FUROSEMIDE 20 MG PO TABS: 20.0000 mg | ORAL_TABLET | Freq: Every day | ORAL | 3 refills | Status: DC

## 2019-09-22 NOTE — Telephone Encounter (Signed)
I spoke with the patient regarding his echo results and Laurann Montana, NP's recommendations to:  1) Start lasix 20 mg once daily 2) Repeat a BMP in 1-2 weeks 3) follow up with Dr. Rockey Situ on 5/24 to re-evaluate his symptoms and discuss possible DCCV  The patient did have some trouble understanding there results at first. I advised that the main things to know at this point are his heart pumping function is low normal and there are some elevated pressures to the pulmonary arteries that could be causing his SOB.  I advised we can try the lasix 20 mg once daily to see if this will help his breathing and schedule a follow up appointment with Dr. Rockey Situ in 2 weeks to reassess his response to the lasix and discuss if a possible DCCV is needed as well.   The patient did voice understanding at this time of these recommendations and was agreeable.  He is schedule to see Dr. Rockey Situ on 5/24 at 2:40 pm. His RX for lasix will be sent to [facility]. He will need a repeat BMP on 5/24.

## 2019-09-22 NOTE — Telephone Encounter (Signed)
Loel Dubonnet, NP  09/22/2019 12:57 PM EDT    Echo shows low normal pumping function. New moderate enlargement of the RV with tricuspid leakiness that has progressed from mild to mild-moderate. Mildly elevated pressures in the pulmonary (lung) arteries. Could contribute to some of his shortness of breath/dyspnea. Recommend starting Lasix 20mg  daily in the morning. Repeat BMP in 1-2 weeks at [facility].   (Of note, he has atrial flutter and has been considering DCCV. Looks like Dr. Rockey Situ has an opening 10/06/19 and if still available can we please offer to him? He's just had lots of concerns and might feel better seeing his primary cardiologist. If so, labs can be done at that visit).

## 2019-10-04 NOTE — Progress Notes (Signed)
Date:  10/06/2019   ID:  Dodge, Friedrichs 1941/04/17, MRN [id number]  Patient Location:  [address] [place] [address]   Provider location:   [facility], [place] office  PCP:  Adin Hector, MD  Cardiologist:  Arvid Right [facility]   Chief Complaint  Patient presents with  . other    Follow up Echo. Pt. c/o pounding in chest, fatigue, shortness of breath and discuss having a cardioversion. Meds reviewed by the pt. verbally.     History of Present Illness:    Ryan Allison is a 79 y.o. male  past medical history of atrial flutter of uncertain chronicity in 05/2018 not on full dose anticoagulation secondary to prior life threatening GI bleeds,  recurrent prostate cancer , PET scan from 04/2018 showing no metastatic disease,  aortic atherosclerosis,  HLD,  OSA on CPAP,  diverticulosis,  peripheral neuropathy,  prior tobacco abuse,  GERD In the hospital  from 1/23 to 1/25 for GI bleed and  atrial flutter Persistent atrial fibrillation since January 2020, persistent Who presents for atrial flutter, shortness of breath/diastolic CHF, weakness  Seen in the clinic September 11, 2019  Colectomy at [facility] February 2020 Started on Eliquis 5 twice daily, no recurrent bleeding Was only taking metoprolol once a day Previous office visits not interested in cardioversion  Echocardiogram Sep 19, 2019, results discussed with him in detail  ejection fraction 50 to 55% Mildly dilated left atrium Rhythm is atrial flutter Mildly elevated right heart pressures  SOB past few weeks Lasix 20 mg daily started 2 weeks ago Feels no different, breathing is no better  Weight down to 169,  Weight dropping 30 pounds since January 2020 iliostomy changes what he can eat  Still taking metoprolol 25 daily  Main complaint is chronic weakness, shortness of breath, all of his muscles feel weak  EKG personally reviewed by myself on todays visit Atrial flutter rate 76  bpm  [facility] on 06/06/18 following 2 episodes of BRBPR with associated presyncope, SOB, and palpitations.   in atrial flutter with RVR.   transfusion of pRBC secondary to blood loss. He was rate controlled.   not able to be placed on anticoagulation given recurrent GI bleeding.   colonoscopy on 06/07/2018 that showed possible evidence of diverticular bleed in the sigmoid colon, diverticulosis of the transverse, ascending, and sigmoid colon as well as radiation proctitis. It was recommended he follow up with a colorectal surgeon for possible colectomy due to recurrent diverticular bleed.   Echo during his admission on 06/07/2018 showed an EF of 60-65%, normal wall motion, mild MR, left atrium normal in size, RVSF normal, PASP 33 mmHg, rhythm was atrial flutter.  Seen by PCP on 06/11/2018, EKG showed he remained in atrial flutter  PCP again on 06/17/2018 with a rash that started underneath his axilla, though spread diffusely. He had already self discontinued Cardizem with some improvement in rash. He was placed on metoprolol for rate control of his atrial flutter.  Continues on metoprolol  Prior to GI surgery, had no sx of SOB from his atrial flutter Underwent colectomy at [facility], for diverticuli and GI bleeding, 07/12/18 In follow up had SBO, [facility] x 1 week Ostomy output has been good.  Drinking Gatorade to stay hydrated    Prior CV studies:   The following studies were reviewed today:  Echo 05/2018 - Left ventricle: The cavity size was normal. Systolic function was   normal.  The estimated ejection fraction was in the range of 60%   to 65%. Wall motion was normal; there were no regional wall   motion abnormalities. - Mitral valve: There was mild regurgitation. - Left atrium: The atrium was normal in size. - Right ventricle: Systolic function was normal. - Pulmonary arteries: Systolic pressure was within the normal   range. PA peak pressure: 33 mm Hg (S).  Impressions: - Rhythm is atrial  flutter.   Past Medical History:  Diagnosis Date  . Aortic atherosclerosis ([place])   . Arthritis   . Atrial flutter ([place])    a. diagnosed 05/2018; b. not on [place] 2/2 recurrent life threatening GI bleed; c. CHADS2VASc => 3 (age x 2, vascular disease)  . Cancer [facility]) 2011   prostate  . GI bleed   . History of echocardiogram    a. TTE 05/2018: EF of 60-65%, normal wall motion, mild MR, left atrium normal in size, RVSF normal, PASP 33 mmHg, rhythm was atrial flutter  . History of GI diverticular bleed    extended stay in the [facility] ICU due to GI bleeding prior to 2016  . HLD (hyperlipidemia)   . Hyperlipidemia   . Hypertension   . Meniscus tear   . OSA on CPAP   . Peripheral neuropathy    Past Surgical History:  Procedure Laterality Date  . COLONOSCOPY Left 06/07/2018   Procedure: COLONOSCOPY;  Surgeon: Virgel Manifold, MD;  Location[facility] ENDOSCOPY;  Service: Endoscopy;  Laterality: Left;  . COLONOSCOPY WITH PROPOFOL N/A 04/06/2015   Procedure: COLONOSCOPY WITH PROPOFOL;  Surgeon: Lollie Sails, MD;  Location: [facility] ENDOSCOPY;  Service: Endoscopy;  Laterality: N/A;  . COLONOSCOPY WITH PROPOFOL N/A 06/07/2018   Procedure: COLONOSCOPY WITH PROPOFOL;  Surgeon: Virgel Manifold, MD;  Location: [facility] ENDOSCOPY;  Service: Endoscopy;  Laterality: N/A;  . HERNIA REPAIR Left 09-23-14   inguinal  . INGUINAL HERNIA REPAIR Left 09/23/2014   Procedure: HERNIA REPAIR INGUINAL ADULT;  Surgeon: Christene Lye, MD;  Location: [facility] ORS;  Service: General;  Laterality: Left;  . MENISCUS REPAIR  2008  . VASCULAR SURGERY  2015     Current Meds  Medication Sig  . acetaminophen (TYLENOL) 500 MG tablet Take 500 mg by mouth daily as needed for fever or pain.   Marland Kitchen apixaban (ELIQUIS) 5 MG TABS tablet Take 5 mg by mouth 2 (two) times daily.   . bicalutamide (CASODEX) 50 MG tablet Take 50 mg by mouth daily.  . cyanocobalamin 1000 MCG tablet Take 1,000 mcg by mouth daily.  . furosemide (LASIX) 20  MG tablet Take 1 tablet (20 mg total) by mouth daily.  Marland Kitchen gabapentin (NEURONTIN) 600 MG tablet Take 600 mg by mouth 5 (five) times daily.   . metoprolol tartrate (LOPRESSOR) 25 MG tablet Take 0.5 tablet (12.5 mg) by mouth twice daily  . omeprazole (PRILOSEC) 40 MG capsule Take 40 mg by mouth daily.  . simvastatin (ZOCOR) 20 MG tablet Take 20 mg by mouth at bedtime.   . tamsulosin (FLOMAX) 0.4 MG CAPS capsule Take 0.4 mg by mouth daily.   . traMADol (ULTRAM) 50 MG tablet Take 50 mg by mouth every 6 (six) hours as needed for moderate pain.      Allergies:   Nsaids, Tolmetin, Celebrex [celecoxib], Diltiazem hcl, Other, and Venlafaxine   Social History   Tobacco Use  . Smoking status: Former Research scientist (life sciences)  . Smokeless tobacco: Never Used  Substance Use Topics  . Alcohol use: No  .  Drug use: No     Family Hx: The patient's family history includes Cancer in his brother and father; Dementia in his mother.  ROS:   Please see the history of present illness.    Review of Systems  Constitutional: Positive for malaise/fatigue.  Respiratory: Positive for shortness of breath.   Cardiovascular: Negative.   Gastrointestinal: Negative.   Musculoskeletal: Negative.   Neurological: Negative.   Psychiatric/Behavioral: Negative.   All other systems reviewed and are negative.    Labs/Other Tests and Data Reviewed:    Recent Labs: No results found for requested labs within last 8760 hours.   Recent Lipid Panel No results found for: CHOL, TRIG, HDL, CHOLHDL, LDLCALC, LDLDIRECT  Wt Readings from Last 3 Encounters:  10/06/19 172 lb 4 oz (78.1 kg)  09/11/19 175 lb 5 oz (79.5 kg)  03/19/19 182 lb 8 oz (82.8 kg)     Exam:    BP 120/80 (BP Location: Left Arm, Patient Position: Sitting, Cuff Size: Normal)   Pulse 76   Ht 5\' 10"  (1.778 m)   Wt 172 lb 4 oz (78.1 kg)   SpO2 98%   BMI 24.72 kg/m   Constitutional:  oriented to person, place, and time. No distress.  HENT:  Head: Grossly  normal Eyes:  no discharge. No scleral icterus.  Neck: No JVD, no carotid bruits  Cardiovascular: Regular rate and rhythm, no murmurs appreciated Pulmonary/Chest: Clear to auscultation bilaterally, no wheezes or rails Abdominal: Soft.  no distension.  no tenderness.  Musculoskeletal: Normal range of motion Neurological:  normal muscle tone. Coordination normal. No atrophy Skin: Skin warm and dry Psychiatric: normal affect, pleasant   ASSESSMENT & PLAN:    Atrial flutter, unspecified type ([place]) Long discussion with him concerning his atrial flutter management,  -We did offer cardioversion, he was not particularly enthusiastic but will consider it -Recommend he stay on his Lasix for chronic diastolic CHF  Chronic diastolic CHF Secondary to atrial flutter Elevated right heart pressures on echocardiogram performed this month Cardiac rehab recommended Weight down 30 pounds in the past year and a half likely from depression, not eating  Shortness of breath With profound weakness, weight loss, depression, inactivity over the past 18 months Desperately needs rehab Reports he is recently completed PT but still very short of breath --Discussed possible cardioversion if he feels up to it --If no improvement in his symptoms with rehab, and restoring normal sinus rhythm, would need to consider alternatives such as neuromuscular disorder/ALS   Aortic atherosclerosis ([place]) On a statin Lab work checked by primary care  OSA on CPAP Managed by primary car   Total encounter time more than 45 minutes  Greater than 50% was spent in counseling and coordination of care with the patient   Signed, Ida Rogue, MD  10/06/2019 3:12 PM    [place] Office [id number], [place], [place] [address]

## 2019-10-06 ENCOUNTER — Ambulatory Visit (INDEPENDENT_AMBULATORY_CARE_PROVIDER_SITE_OTHER): Payer: Medicare Other | Admitting: Cardiovascular Disease

## 2019-10-06 ENCOUNTER — Encounter: Payer: Self-pay | Admitting: Cardiovascular Disease

## 2019-10-06 ENCOUNTER — Other Ambulatory Visit: Payer: Self-pay

## 2019-10-06 VITALS — BP 120/80 | HR 76 | Ht 70.0 in | Wt 172.2 lb

## 2019-10-06 DIAGNOSIS — I4892 Unspecified atrial flutter: Secondary | ICD-10-CM

## 2019-10-06 DIAGNOSIS — E782 Mixed hyperlipidemia: Secondary | ICD-10-CM

## 2019-10-06 DIAGNOSIS — I700 Atherosclerosis of aorta: Secondary | ICD-10-CM

## 2019-10-06 DIAGNOSIS — G4733 Obstructive sleep apnea (adult) (pediatric): Secondary | ICD-10-CM

## 2019-10-06 DIAGNOSIS — K922 Gastrointestinal hemorrhage, unspecified: Secondary | ICD-10-CM

## 2019-10-06 DIAGNOSIS — Z9989 Dependence on other enabling machines and devices: Secondary | ICD-10-CM

## 2019-10-06 NOTE — Patient Instructions (Addendum)
Cardiac rehab  For diastolic CHF They will call you in roughly 2 weeks once they review insurance. [phone]  Find out if eliquis gets absorbed in folks with colectomy   Medication Instructions:  Stay on lasix 20 mg daily  If you need a refill on your cardiac medications before your next appointment, please call your pharmacy.    Lab work: No new labs needed   If you have labs (blood work) drawn today and your tests are completely normal, you will receive your results only by: Marland Kitchen MyChart Message (if you have MyChart) OR . A paper copy in the mail If you have any lab test that is abnormal or we need to change your treatment, we will call you to review the results.   Testing/Procedures: No new testing needed   Follow-Up: At [facility], you and your health needs are our priority.  As part of our continuing mission to provide you with exceptional heart care, we have created designated Provider Care Teams.  These Care Teams include your primary Cardiologist (physician) and Advanced Practice Providers (APPs -  Physician Assistants and Nurse Practitioners) who all work together to provide you with the care you need, when you need it.  . You will need a follow up appointment in 6 months   . Providers on your designated Care Team:   . Murray Hodgkins, NP . Christell Faith, PA-C . Marrianne Mood, PA-C  Any Other Special Instructions Will Be Listed Below (If Applicable).  For educational health videos Log in to : [email or web address] Or : [email or web address], password : [email or web address]

## 2019-10-08 ENCOUNTER — Other Ambulatory Visit: Payer: Self-pay | Admitting: *Deleted

## 2019-10-08 DIAGNOSIS — I5032 Chronic diastolic (congestive) heart failure: Secondary | ICD-10-CM

## 2019-10-10 ENCOUNTER — Telehealth: Payer: Self-pay | Admitting: Cardiovascular Disease

## 2019-10-10 MED ORDER — RIVAROXABAN 20 MG PO TABS: 20.0000 mg | ORAL_TABLET | Freq: Every day | ORAL | 3 refills | Status: DC

## 2019-10-10 NOTE — Telephone Encounter (Signed)
Spoke with patient and reviewed that Eliquis is 55% processed through the lower intestine so it is not as effective given his ileostomy. Reviewed that we did call about xarelto and that is absorbed in the stomach and therefore this is a better medication for him. Order sent through [facility] per his request and advised to please call back if questions. He verbalized understanding of our conversation, agreement with plan, and had no further concerns at this time.

## 2019-10-16 ENCOUNTER — Ambulatory Visit: Payer: Medicare Other | Admitting: Family

## 2019-11-24 ENCOUNTER — Encounter: Payer: Medicare Other | Attending: Cardiovascular Disease | Admitting: *Deleted

## 2019-11-24 ENCOUNTER — Other Ambulatory Visit: Payer: Self-pay

## 2019-11-24 ENCOUNTER — Encounter: Payer: Self-pay | Admitting: *Deleted

## 2019-11-24 DIAGNOSIS — I5032 Chronic diastolic (congestive) heart failure: Secondary | ICD-10-CM

## 2019-11-24 DIAGNOSIS — E785 Hyperlipidemia, unspecified: Secondary | ICD-10-CM | POA: Insufficient documentation

## 2019-11-24 DIAGNOSIS — Z87891 Personal history of nicotine dependence: Secondary | ICD-10-CM | POA: Insufficient documentation

## 2019-11-24 DIAGNOSIS — G4733 Obstructive sleep apnea (adult) (pediatric): Secondary | ICD-10-CM | POA: Insufficient documentation

## 2019-11-24 DIAGNOSIS — I700 Atherosclerosis of aorta: Secondary | ICD-10-CM | POA: Insufficient documentation

## 2019-11-24 DIAGNOSIS — I4892 Unspecified atrial flutter: Secondary | ICD-10-CM | POA: Insufficient documentation

## 2019-11-24 DIAGNOSIS — Z79899 Other long term (current) drug therapy: Secondary | ICD-10-CM | POA: Insufficient documentation

## 2019-11-24 DIAGNOSIS — G629 Polyneuropathy, unspecified: Secondary | ICD-10-CM | POA: Insufficient documentation

## 2019-11-24 DIAGNOSIS — Z7901 Long term (current) use of anticoagulants: Secondary | ICD-10-CM | POA: Insufficient documentation

## 2019-11-24 NOTE — Progress Notes (Signed)
Virtual Orientation completed today . Has EP Eval and gym orientation scheduled on 11/27/19.

## 2019-11-27 ENCOUNTER — Other Ambulatory Visit: Payer: Self-pay

## 2019-11-27 VITALS — Ht 70.0 in | Wt 170.8 lb

## 2019-11-27 DIAGNOSIS — Z79899 Other long term (current) drug therapy: Secondary | ICD-10-CM | POA: Diagnosis not present

## 2019-11-27 DIAGNOSIS — G629 Polyneuropathy, unspecified: Secondary | ICD-10-CM | POA: Diagnosis not present

## 2019-11-27 DIAGNOSIS — Z7901 Long term (current) use of anticoagulants: Secondary | ICD-10-CM | POA: Diagnosis not present

## 2019-11-27 DIAGNOSIS — E785 Hyperlipidemia, unspecified: Secondary | ICD-10-CM | POA: Diagnosis not present

## 2019-11-27 DIAGNOSIS — I700 Atherosclerosis of aorta: Secondary | ICD-10-CM | POA: Diagnosis not present

## 2019-11-27 DIAGNOSIS — Z87891 Personal history of nicotine dependence: Secondary | ICD-10-CM | POA: Diagnosis not present

## 2019-11-27 DIAGNOSIS — I4892 Unspecified atrial flutter: Secondary | ICD-10-CM | POA: Diagnosis not present

## 2019-11-27 DIAGNOSIS — I5032 Chronic diastolic (congestive) heart failure: Secondary | ICD-10-CM

## 2019-11-27 DIAGNOSIS — G4733 Obstructive sleep apnea (adult) (pediatric): Secondary | ICD-10-CM | POA: Diagnosis not present

## 2019-11-27 NOTE — Progress Notes (Signed)
Pulmonary Individual Treatment Plan  Patient Details  Name: Ryan Allison MRN: [id number] Date of Birth: January 25, 1941 Referring Provider:     Pulmonary Rehab from 11/27/2019 in [facility] Cardiac and Pulmonary Rehab  Referring Provider Gollan      Initial Encounter Date:    Pulmonary Rehab from 11/27/2019 in [facility] Cardiac and Pulmonary Rehab  Date 11/27/19      Visit Diagnosis: Heart failure, diastolic, chronic ([place])  Patient's Home Medications on Admission:  Current Outpatient Medications:  .  acetaminophen (TYLENOL) 500 MG tablet, Take 500 mg by mouth daily as needed for fever or pain. , Disp: , Rfl:  .  bicalutamide (CASODEX) 50 MG tablet, Take 50 mg by mouth daily. (Patient not taking: Reported on 11/24/2019), Disp: , Rfl:  .  cyanocobalamin 1000 MCG tablet, Take 1,000 mcg by mouth daily., Disp: , Rfl:  .  furosemide (LASIX) 20 MG tablet, Take 1 tablet (20 mg total) by mouth daily., Disp: 30 tablet, Rfl: 3 .  gabapentin (NEURONTIN) 600 MG tablet, Take 600 mg by mouth 5 (five) times daily. , Disp: , Rfl:  .  metoprolol tartrate (LOPRESSOR) 25 MG tablet, Take 0.5 tablet (12.5 mg) by mouth twice daily, Disp: 90 tablet, Rfl: 3 .  omeprazole (PRILOSEC) 40 MG capsule, Take 40 mg by mouth daily., Disp: , Rfl:  .  rivaroxaban (XARELTO) 20 MG TABS tablet, Take 1 tablet (20 mg total) by mouth daily with supper., Disp: 90 tablet, Rfl: 3 .  simvastatin (ZOCOR) 20 MG tablet, Take 20 mg by mouth at bedtime. , Disp: , Rfl:  .  tamsulosin (FLOMAX) 0.4 MG CAPS capsule, Take 0.4 mg by mouth daily. , Disp: , Rfl:  .  traMADol (ULTRAM) 50 MG tablet, Take 50 mg by mouth every 6 (six) hours as needed for moderate pain. , Disp: , Rfl:   Past Medical History: Past Medical History:  Diagnosis Date  . Aortic atherosclerosis ([place])   . Arthritis   . Atrial flutter ([place])    a. diagnosed 05/2018; b. not on [place] 2/2 recurrent life threatening GI bleed; c. CHADS2VASc => 3 (age x 2, vascular disease)  . Cancer [facility])  2011   prostate  . GI bleed   . History of echocardiogram    a. TTE 05/2018: EF of 60-65%, normal wall motion, mild MR, left atrium normal in size, RVSF normal, PASP 33 mmHg, rhythm was atrial flutter  . History of GI diverticular bleed    extended stay in the [facility] ICU due to GI bleeding prior to 2016  . HLD (hyperlipidemia)   . Hyperlipidemia   . Hypertension   . Meniscus tear   . OSA on CPAP   . Peripheral neuropathy     Tobacco Use: Social History   Tobacco Use  Smoking Status Former Smoker  . Types: Cigarettes  . Quit date: 05/15/1969  . Years since quitting: 50.5  Smokeless Tobacco Never Used    Labs: Recent Chemical engineer    Labs for ITP Cardiac and Pulmonary Rehab Latest Ref Rng & Units 06/06/2018   Hemoglobin A1c 4.8 - 5.6 % 5.5       Pulmonary Assessment Scores:  Pulmonary Assessment Scores    Row Name 11/27/19 1525         ADL UCSD   SOB Score total 89     Rest 2     Walk 4     Stairs 5     Bath 5     Dress  3     Shop 4       CAT Score   CAT Score 17       mMRC Score   mMRC Score 3            UCSD: Self-administered rating of dyspnea associated with activities of daily living (ADLs) 6-point scale (0 = "not at all" to 5 = "maximal or unable to do because of breathlessness")  Scoring Scores range from 0 to 120.  Minimally important difference is 5 units  CAT: CAT can identify the health impairment of COPD patients and is better correlated with disease progression.  CAT has a scoring range of zero to 40. The CAT score is classified into four groups of low (less than 10), medium (10 - 20), high (21-30) and very high (31-40) based on the impact level of disease on health status. A CAT score over 10 suggests significant symptoms.  A worsening CAT score could be explained by an exacerbation, poor medication adherence, poor inhaler technique, or progression of COPD or comorbid conditions.  CAT MCID is 2 points  mMRC: mMRC (Modified Medical  Research Council) Dyspnea Scale is used to assess the degree of baseline functional disability in patients of respiratory disease due to dyspnea. No minimal important difference is established. A decrease in score of 1 point or greater is considered a positive change.   Pulmonary Function Assessment:   Exercise Target Goals: Exercise Program Goal: Individual exercise prescription set using results from initial 6 min walk test and THRR while considering  patient's activity barriers and safety.   Exercise Prescription Goal: Initial exercise prescription builds to 30-45 minutes a day of aerobic activity, 2-3 days per week.  Home exercise guidelines will be given to patient during program as part of exercise prescription that the participant will acknowledge.  Education: Aerobic Exercise & Resistance Training: - Gives group verbal and written instruction on the various components of exercise. Focuses on aerobic and resistive training programs and the benefits of this training and how to safely progress through these programs..   Education: Exercise & Equipment Safety: - Individual verbal instruction and demonstration of equipment use and safety with use of the equipment.   Pulmonary Rehab from 11/27/2019 in [facility] Cardiac and Pulmonary Rehab  Date 11/27/19  Educator AS  Instruction Review Code 1- Verbalizes Understanding      Education: Exercise Physiology & General Exercise Guidelines: - Group verbal and written instruction with models to review the exercise physiology of the cardiovascular system and associated critical values. Provides general exercise guidelines with specific guidelines to those with heart or lung disease.    Education: Flexibility, Balance, Mind/Body Relaxation: Provides group verbal/written instruction on the benefits of flexibility and balance training, including mind/body exercise modes such as yoga, pilates and tai chi.  Demonstration and skill practice  provided.   Activity Barriers & Risk Stratification:  Activity Barriers & Cardiac Risk Stratification - 11/24/19 1149      Activity Barriers & Cardiac Risk Stratification   Activity Barriers Shortness of Breath;Deconditioning           6 Minute Walk:  6 Minute Walk    Row Name 11/27/19 1518         6 Minute Walk   Distance 1090 feet     Walk Time 6 minutes     # of Rest Breaks 0     MPH 2.06     METS 2.4     RPE 13  Perceived Dyspnea  2     VO2 Peak 8.5     Symptoms No     Resting HR 81 bpm     Resting BP 102/58     Resting Oxygen Saturation  97 %     Exercise Oxygen Saturation  during 6 min walk 97 %     Max Ex. HR 99 bpm     Max Ex. BP 124/58     2 Minute Post BP 98/54       Interval HR   1 Minute HR 92     2 Minute HR 99     3 Minute HR 99     4 Minute HR 92     6 Minute HR 93     2 Minute Post HR 88     Interval Heart Rate? Yes       Interval Oxygen   Interval Oxygen? Yes     Baseline Oxygen Saturation % 97 %     1 Minute Oxygen Saturation % 97 %     1 Minute Liters of Oxygen 0 L     2 Minute Oxygen Saturation % 98 %     2 Minute Liters of Oxygen 0 L     3 Minute Oxygen Saturation % 97 %     3 Minute Liters of Oxygen 0 L     4 Minute Oxygen Saturation % 98 %     4 Minute Liters of Oxygen 0 L     5 Minute Oxygen Saturation % 98 %     5 Minute Liters of Oxygen 0 L     6 Minute Oxygen Saturation % 98 %     6 Minute Liters of Oxygen 0 L     2 Minute Post Oxygen Saturation % 98 %     2 Minute Post Liters of Oxygen 0 L           Oxygen Initial Assessment:  Oxygen Initial Assessment - 11/24/19 1134      Home Oxygen   Home Oxygen Device None    Sleep Oxygen Prescription None    Home Exercise Oxygen Prescription None    Home at Rest Exercise Oxygen Prescription None      Intervention   Short Term Goals To learn and demonstrate proper pursed lip breathing techniques or other breathing techniques.    Long  Term Goals Exhibits proper breathing  techniques, such as pursed lip breathing or other method taught during program session           Oxygen Re-Evaluation:   Oxygen Discharge (Final Oxygen Re-Evaluation):   Initial Exercise Prescription:  Initial Exercise Prescription - 11/27/19 1500      Date of Initial Exercise RX and Referring Provider   Date 11/27/19    Referring Provider Gollan      Treadmill   MPH 1.7    Grade 0.5    Minutes 15    METs 2.4      NuStep   Level 2    SPM 80    Minutes 15    METs 2.4      REL-XR   Level 2    Speed 50    Minutes 15    METs 2.4      T5 Nustep   Level 1    SPM 80    Minutes 15    METs 2.4      Prescription Details   Frequency (times per week) 3  Duration Progress to 30 minutes of continuous aerobic without signs/symptoms of physical distress      Intensity   THRR 40-80% of Max Heartrate 106-131    Ratings of Perceived Exertion 11-15    Perceived Dyspnea 0-4      Resistance Training   Training Prescription Yes    Weight 3lb    Reps 10-15           Perform Capillary Blood Glucose checks as needed.  Exercise Prescription Changes:   Exercise Comments:   Exercise Goals and Review:  Exercise Goals    Row Name 11/27/19 1524             Exercise Goals   Increase Physical Activity Yes       Intervention Provide advice, education, support and counseling about physical activity/exercise needs.;Develop an individualized exercise prescription for aerobic and resistive training based on initial evaluation findings, risk stratification, comorbidities and participant's personal goals.       Expected Outcomes Short Term: Attend rehab on a regular basis to increase amount of physical activity.;Long Term: Add in home exercise to make exercise part of routine and to increase amount of physical activity.;Long Term: Exercising regularly at least 3-5 days a week.       Increase Strength and Stamina Yes       Intervention Provide advice, education, support and  counseling about physical activity/exercise needs.;Develop an individualized exercise prescription for aerobic and resistive training based on initial evaluation findings, risk stratification, comorbidities and participant's personal goals.       Expected Outcomes Short Term: Increase workloads from initial exercise prescription for resistance, speed, and METs.;Short Term: Perform resistance training exercises routinely during rehab and add in resistance training at home;Long Term: Improve cardiorespiratory fitness, muscular endurance and strength as measured by increased METs and functional capacity (6MWT)       Able to understand and use rate of perceived exertion (RPE) scale Yes       Intervention Provide education and explanation on how to use RPE scale       Expected Outcomes Short Term: Able to use RPE daily in rehab to express subjective intensity level;Long Term:  Able to use RPE to guide intensity level when exercising independently       Able to understand and use Dyspnea scale Yes       Intervention Provide education and explanation on how to use Dyspnea scale       Expected Outcomes Short Term: Able to use Dyspnea scale daily in rehab to express subjective sense of shortness of breath during exertion;Long Term: Able to use Dyspnea scale to guide intensity level when exercising independently       Knowledge and understanding of Target Heart Rate Range (THRR) Yes       Intervention Provide education and explanation of THRR including how the numbers were predicted and where they are located for reference       Expected Outcomes Short Term: Able to state/look up THRR;Short Term: Able to use daily as guideline for intensity in rehab;Long Term: Able to use THRR to govern intensity when exercising independently       Able to check pulse independently Yes       Intervention Provide education and demonstration on how to check pulse in carotid and radial arteries.;Review the importance of being able to  check your own pulse for safety during independent exercise       Expected Outcomes Short Term: Able to explain why pulse checking  is important during independent exercise;Long Term: Able to check pulse independently and accurately       Understanding of Exercise Prescription Yes       Intervention Provide education, explanation, and written materials on patient's individual exercise prescription       Expected Outcomes Short Term: Able to explain program exercise prescription;Long Term: Able to explain home exercise prescription to exercise independently              Exercise Goals Re-Evaluation :   Discharge Exercise Prescription (Final Exercise Prescription Changes):   Nutrition:  Target Goals: Understanding of nutrition guidelines, daily intake of sodium <1548m, cholesterol <2096m calories 30% from fat and 7% or less from saturated fats, daily to have 5 or more servings of fruits and vegetables.  Education: Controlling Sodium/Reading Food Labels -Group verbal and written material supporting the discussion of sodium use in heart healthy nutrition. Review and explanation with models, verbal and written materials for utilization of the food label.   Education: General Nutrition Guidelines/Fats and Fiber: -Group instruction provided by verbal, written material, models and posters to present the general guidelines for heart healthy nutrition. Gives an explanation and review of dietary fats and fiber.   Biometrics:  Pre Biometrics - 11/27/19 1525      Pre Biometrics   Height _0  (1.778 m)    Weight 170 lb 12.8 oz (77.5 kg)    BMI (Calculated) 24.51    Single Leg Stand 19.13 seconds            Nutrition Therapy Plan and Nutrition Goals:   Nutrition Assessments:   MEDIFICTS Score Key:          ?70 Need to make dietary changes          40-70 Heart Healthy Diet         ? 40 Therapeutic Level Cholesterol Diet  Nutrition Goals Re-Evaluation:   Nutrition Goals  Discharge (Final Nutrition Goals Re-Evaluation):   Psychosocial: Target Goals: Acknowledge presence or absence of significant depression and/or stress, maximize coping skills, provide positive support system. Participant is able to verbalize types and ability to use techniques and skills needed for reducing stress and depression.   Education: Depression - Provides group verbal and written instruction on the correlation between heart/lung disease and depressed mood, treatment options, and the stigmas associated with seeking treatment.   Education: Sleep Hygiene -Provides group verbal and written instruction about how sleep can affect your health.  Define sleep hygiene, discuss sleep cycles and impact of sleep habits. Review good sleep hygiene tips.    Education: Stress and Anxiety: - Provides group verbal and written instruction about the health risks of elevated stress and causes of high stress.  Discuss the correlation between heart/lung disease and anxiety and treatment options. Review healthy ways to manage with stress and anxiety.   Initial Review & Psychosocial Screening:  Initial Psych Review & Screening - 11/24/19 1152      Initial Review   Current issues with Current Stress Concerns    Source of Stress Concerns Chronic Illness;Unable to perform yard/household activities;Unable to participate in former interests or hobbies    Comments Used to enjoy golf, has not been able to do since the surgery.      Family Dynamics   Good Support System? Yes   Son, lady next door"watch out for each other" , neighbors as needed  Church family     Barriers   Psychosocial barriers to participate in program There are no  identifiable barriers or psychosocial needs.;The patient should benefit from training in stress management and relaxation.      Screening Interventions   Interventions Encouraged to exercise    Expected Outcomes Short Term goal: Utilizing psychosocial counselor, staff and  physician to assist with identification of specific Stressors or current issues interfering with healing process. Setting desired goal for each stressor or current issue identified.;Long Term Goal: Stressors or current issues are controlled or eliminated.;Short Term goal: Identification and review with participant of any Quality of Life or Depression concerns found by scoring the questionnaire.;Long Term goal: The participant improves quality of Life and PHQ9 Scores as seen by post scores and/or verbalization of changes           Quality of Life Scores:  Quality of Life - 11/27/19 1526      Quality of Life   Select Quality of Life      Quality of Life Scores   Health/Function Pre 14.65 %    Socioeconomic Pre 23 %    Psych/Spiritual Pre 12.64 %    Family Pre 24 %    GLOBAL Pre 17.07 %          Scores of 19 and below usually indicate a poorer quality of life in these areas.  A difference of  2-3 points is a clinically meaningful difference.  A difference of 2-3 points in the total score of the Quality of Life Index has been associated with significant improvement in overall quality of life, self-image, physical symptoms, and general health in studies assessing change in quality of life.  PHQ-9: Recent Review Flowsheet Data   There is no flowsheet data to display.    Interpretation of Total Score  Total Score Depression Severity:  1-4 = Minimal depression, 5-9 = Mild depression, 10-14 = Moderate depression, 15-19 = Moderately severe depression, 20-27 = Severe depression   Psychosocial Evaluation and Intervention:  Psychosocial Evaluation - 11/24/19 1214      Psychosocial Evaluation & Interventions   Interventions Encouraged to exercise with the program and follow exercise prescription    Comments Gene has no barriers to starting the program.  He stated that he is excited to get started.  He has had several health issues in the past year. 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 He is looking forward to building his core strenght back and having more energy. HIs sleep cycle has been disrupted since his ileostomy surgery. He is catching naps during the day to help. He has a great support group of people from his sons that visit ofter, a neoghbor that "they keep an eye out on each other" , other neighbors and his church family. Gene is hoping this program will help him gain backthe ability to do some of his life activities. He was playing golf before his surgery. He is looking forward to meeting the RD. He should do well in the program.    Expected Outcomes STG: attend his scheduled sessions.     LTG: have more stamina and energy when graduates from the progam and be able to maintain what he built during the program.    Continue Psychosocial Services  Follow up required by staff           Psychosocial Re-Evaluation:   Psychosocial Discharge (Final Psychosocial Re-Evaluation):   Education: Education Goals: Education classes will be provided on a weekly basis, covering required topics. Participant will state understanding/return demonstration of topics presented.  Learning Barriers/Preferences:  Learning  Barriers/Preferences - 11/24/19 1159      Learning Barriers/Preferences   Learning Barriers None    Learning Preferences None           General Pulmonary Education Topics:  Infection Prevention: - Provides verbal and written material to individual with discussion of infection control including proper hand washing and proper equipment cleaning during exercise session.   Pulmonary Rehab from 11/27/2019 in [facility] Cardiac and Pulmonary Rehab  Date 11/27/19  Educator As  Instruction Review Code 1- Verbalizes Understanding      Falls Prevention: - Provides verbal and written material to individual with discussion of falls prevention and safety.   Pulmonary Rehab from 11/27/2019 in [facility] Cardiac and Pulmonary Rehab  Date 11/27/19  Educator AS   Instruction Review Code 1- Verbalizes Understanding      Chronic Lung Diseases: - Group verbal and written instruction to review updates, respiratory medications, advancements in procedures and treatments. Discuss use of supplemental oxygen including available portable oxygen systems, continuous and intermittent flow rates, concentrators, personal use and safety guidelines. Review proper use of inhaler and spacers. Provide informative websites for self-education.    Energy Conservation: - Provide group verbal and written instruction for methods to conserve energy, plan and organize activities. Instruct on pacing techniques, use of adaptive equipment and posture/positioning to relieve shortness of breath.   Triggers and Exacerbations: - Group verbal and written instruction to review types of environmental triggers and ways to prevent exacerbations. Discuss weather changes, air quality and the benefits of nasal washing. Review warning signs and symptoms to help prevent infections. Discuss techniques for effective airway clearance, coughing, and vibrations.   AED/CPR: - Group verbal and written instruction with the use of models to demonstrate the basic use of the AED with the basic ABC's of resuscitation.   Anatomy and Physiology of the Lungs: - Group verbal and written instruction with the use of models to provide basic lung anatomy and physiology related to function, structure and complications of lung disease.   Anatomy & Physiology of the Heart: - Group verbal and written instruction and models provide basic cardiac anatomy and physiology, with the coronary electrical and arterial systems. Review of Valvular disease and Heart Failure   Cardiac Medications: - Group verbal and written instruction to review commonly prescribed medications for heart disease. Reviews the medication, class of the drug, and side effects.   Other: -Provides group and verbal instruction on various topics  (see comments)   Knowledge Questionnaire Score:  Knowledge Questionnaire Score - 11/27/19 1527      Knowledge Questionnaire Score   Pre Score 21/26 card 12/18 pulm            Core Components/Risk Factors/Patient Goals at Admission:  Personal Goals and Risk Factors at Admission - 11/24/19 1159      Core Components/Risk Factors/Patient Goals on Admission    Weight Management Yes;Weight Maintenance    Intervention Weight Management: Develop a combined nutrition and exercise program designed to reach desired caloric intake, while maintaining appropriate intake of nutrient and fiber, sodium and fats, and appropriate energy expenditure required for the weight goal.;Weight Management: Provide education and appropriate resources to help participant work on and attain dietary goals.    Admit Weight 172 lb (78 kg)    Goal Weight: Short Term 174 lb (78.9 kg)    Goal Weight: Long Term 175 lb (79.4 kg)    Expected Outcomes Short Term: Continue to assess and modify interventions until short term weight is achieved;Long Term:  Adherence to nutrition and physical activity/exercise program aimed toward attainment of established weight goal;Weight Maintenance: Understanding of the daily nutrition guidelines, which includes 25-35% calories from fat, 7% or less cal from saturated fats, less than 215m cholesterol, less than 1.5gm of sodium, & 5 or more servings of fruits and vegetables daily    Heart Failure Yes    Intervention Provide a combined exercise and nutrition program that is supplemented with education, support and counseling about heart failure. Directed toward relieving symptoms such as shortness of breath, decreased exercise tolerance, and extremity edema.    Expected Outcomes Improve functional capacity of life;Short term: Attendance in program 2-3 days a week with increased exercise capacity. Reported lower sodium intake. Reported increased fruit and vegetable intake. Reports medication  compliance.;Short term: Daily weights obtained and reported for increase. Utilizing diuretic protocols set by physician.;Long term: Adoption of self-care skills and reduction of barriers for early signs and symptoms recognition and intervention leading to self-care maintenance.    Lipids Yes    Intervention Provide education and support for participant on nutrition & aerobic/resistive exercise along with prescribed medications to achieve LDL <752m HDL >4042m   Expected Outcomes Short Term: Participant states understanding of desired cholesterol values and is compliant with medications prescribed. Participant is following exercise prescription and nutrition guidelines.;Long Term: Cholesterol controlled with medications as prescribed, with individualized exercise RX and with personalized nutrition plan. Value goals: LDL < 32m54mDL > 40 mg.           Education:Diabetes - Individual verbal and written instruction to review signs/symptoms of diabetes, desired ranges of glucose level fasting, after meals and with exercise. Acknowledge that pre and post exercise glucose checks will be done for 3 sessions at entry of program.   Education: Know Your Numbers and Risk Factors: -Group verbal and written instruction about important numbers in your health.  Discussion of what are risk factors and how they play a role in the disease process.  Review of Cholesterol, Blood Pressure, Diabetes, and BMI and the role they play in your overall health.   Core Components/Risk Factors/Patient Goals Review:    Core Components/Risk Factors/Patient Goals at Discharge (Final Review):    ITP Comments:  ITP Comments    Row Name 11/24/19 1131           ITP Comments Virtual Orientation completed today . Has EP Eval and gym orientationscheduled on 11/27/19.              Comments: initial ITP

## 2019-11-27 NOTE — Patient Instructions (Signed)
Patient Instructions  Patient Details  Name: Ryan Allison MRN: [id number] Date of Birth: Oct 05, 1940 Referring Provider:  Minna Merritts, MD  Below are your personal goals for exercise, nutrition, and risk factors. Our goal is to help you stay on track towards obtaining and maintaining these goals. We will be discussing your progress on these goals with you throughout the program.  Initial Exercise Prescription:  Initial Exercise Prescription - 11/27/19 1500      Date of Initial Exercise RX and Referring Provider   Date 11/27/19    Referring Provider Gollan      Treadmill   MPH 1.7    Grade 0.5    Minutes 15    METs 2.4      NuStep   Level 2    SPM 80    Minutes 15    METs 2.4      REL-XR   Level 2    Speed 50    Minutes 15    METs 2.4      T5 Nustep   Level 1    SPM 80    Minutes 15    METs 2.4      Prescription Details   Frequency (times per week) 3    Duration Progress to 30 minutes of continuous aerobic without signs/symptoms of physical distress      Intensity   THRR 40-80% of Max Heartrate 106-131    Ratings of Perceived Exertion 11-15    Perceived Dyspnea 0-4      Resistance Training   Training Prescription Yes    Weight 3lb    Reps 10-15           Exercise Goals: Frequency: Be able to perform aerobic exercise two to three times per week in program working toward 2-5 days per week of home exercise.  Intensity: Work with a perceived exertion of 11 (fairly light) - 15 (hard) while following your exercise prescription.  We will make changes to your prescription with you as you progress through the program.   Duration: Be able to do 30 to 45 minutes of continuous aerobic exercise in addition to a 5 minute warm-up and a 5 minute cool-down routine.   Nutrition Goals: Your personal nutrition goals will be established when you do your nutrition analysis with the dietician.  The following are general nutrition guidelines to follow: Cholesterol <  200mg /day Sodium < 1500mg /day Fiber: Men over 50 yrs - 30 grams per day  Personal Goals:  Personal Goals and Risk Factors at Admission - 11/27/19 1530      Core Components/Risk Factors/Patient Goals on Admission    Weight Management Yes;Weight Maintenance    Intervention Weight Management: Develop a combined nutrition and exercise program designed to reach desired caloric intake, while maintaining appropriate intake of nutrient and fiber, sodium and fats, and appropriate energy expenditure required for the weight goal.;Weight Management: Provide education and appropriate resources to help participant work on and attain dietary goals.    Admit Weight 170 lb 12.8 oz (77.5 kg)    Goal Weight: Short Term 170 lb (77.1 kg)    Goal Weight: Long Term 175 lb (79.4 kg)    Expected Outcomes Short Term: Continue to assess and modify interventions until short term weight is achieved;Long Term: Adherence to nutrition and physical activity/exercise program aimed toward attainment of established weight goal;Weight Maintenance: Understanding of the daily nutrition guidelines, which includes 25-35% calories from fat, 7% or less cal from saturated fats, less than 200mg   cholesterol, less than 1.5gm of sodium, & 5 or more servings of fruits and vegetables daily    Intervention Provide a combined exercise and nutrition program that is supplemented with education, support and counseling about heart failure. Directed toward relieving symptoms such as shortness of breath, decreased exercise tolerance, and extremity edema.    Expected Outcomes Improve functional capacity of life;Short term: Attendance in program 2-3 days a week with increased exercise capacity. Reported lower sodium intake. Reported increased fruit and vegetable intake. Reports medication compliance.;Short term: Daily weights obtained and reported for increase. Utilizing diuretic protocols set by physician.;Long term: Adoption of self-care skills and reduction  of barriers for early signs and symptoms recognition and intervention leading to self-care maintenance.    Intervention Provide education and support for participant on nutrition & aerobic/resistive exercise along with prescribed medications to achieve LDL 70mg , HDL >40mg .    Expected Outcomes Short Term: Participant states understanding of desired cholesterol values and is compliant with medications prescribed. Participant is following exercise prescription and nutrition guidelines.;Long Term: Cholesterol controlled with medications as prescribed, with individualized exercise RX and with personalized nutrition plan. Value goals: LDL < 70mg , HDL > 40 mg.           Tobacco Use Initial Evaluation: Social History   Tobacco Use  Smoking Status Former Smoker  . Types: Cigarettes  . Quit date: 05/15/1969  . Years since quitting: 50.5  Smokeless Tobacco Never Used    Exercise Goals and Review:  Exercise Goals    Row Name 11/27/19 1524             Exercise Goals   Increase Physical Activity Yes       Intervention Provide advice, education, support and counseling about physical activity/exercise needs.;Develop an individualized exercise prescription for aerobic and resistive training based on initial evaluation findings, risk stratification, comorbidities and participant's personal goals.       Expected Outcomes Short Term: Attend rehab on a regular basis to increase amount of physical activity.;Long Term: Add in home exercise to make exercise part of routine and to increase amount of physical activity.;Long Term: Exercising regularly at least 3-5 days a week.       Increase Strength and Stamina Yes       Intervention Provide advice, education, support and counseling about physical activity/exercise needs.;Develop an individualized exercise prescription for aerobic and resistive training based on initial evaluation findings, risk stratification, comorbidities and participant's personal goals.        Expected Outcomes Short Term: Increase workloads from initial exercise prescription for resistance, speed, and METs.;Short Term: Perform resistance training exercises routinely during rehab and add in resistance training at home;Long Term: Improve cardiorespiratory fitness, muscular endurance and strength as measured by increased METs and functional capacity (6MWT)       Able to understand and use rate of perceived exertion (RPE) scale Yes       Intervention Provide education and explanation on how to use RPE scale       Expected Outcomes Short Term: Able to use RPE daily in rehab to express subjective intensity level;Long Term:  Able to use RPE to guide intensity level when exercising independently       Able to understand and use Dyspnea scale Yes       Intervention Provide education and explanation on how to use Dyspnea scale       Expected Outcomes Short Term: Able to use Dyspnea scale daily in rehab to express subjective sense of shortness of  breath during exertion;Long Term: Able to use Dyspnea scale to guide intensity level when exercising independently       Knowledge and understanding of Target Heart Rate Range (THRR) Yes       Intervention Provide education and explanation of THRR including how the numbers were predicted and where they are located for reference       Expected Outcomes Short Term: Able to state/look up THRR;Short Term: Able to use daily as guideline for intensity in rehab;Long Term: Able to use THRR to govern intensity when exercising independently       Able to check pulse independently Yes       Intervention Provide education and demonstration on how to check pulse in carotid and radial arteries.;Review the importance of being able to check your own pulse for safety during independent exercise       Expected Outcomes Short Term: Able to explain why pulse checking is important during independent exercise;Long Term: Able to check pulse independently and accurately        Understanding of Exercise Prescription Yes       Intervention Provide education, explanation, and written materials on patient's individual exercise prescription       Expected Outcomes Short Term: Able to explain program exercise prescription;Long Term: Able to explain home exercise prescription to exercise independently              Copy of goals given to participant.

## 2019-12-01 ENCOUNTER — Encounter: Payer: Medicare Other | Admitting: *Deleted

## 2019-12-01 ENCOUNTER — Other Ambulatory Visit: Payer: Self-pay

## 2019-12-01 DIAGNOSIS — I5032 Chronic diastolic (congestive) heart failure: Secondary | ICD-10-CM

## 2019-12-01 NOTE — Progress Notes (Signed)
Daily Session Note  Patient Details  Name: Ryan Allison MRN: [id number] Date of Birth: 1940/10/25 Referring Provider:     Pulmonary Rehab from 11/27/2019 in [facility] Cardiac and Pulmonary Rehab  Referring Provider Gollan      Encounter Date: 12/01/2019  Check In:      Social History   Tobacco Use  Smoking Status Former Smoker  . Types: Cigarettes  . Quit date: 05/15/1969  . Years since quitting: 50.5  Smokeless Tobacco Never Used    Goals Met:  Independence with exercise equipment Exercise tolerated well No report of cardiac concerns or symptoms Strength training completed today  Goals Unmet:  Not Applicable  Comments: Pt able to follow exercise prescription today without complaint.  Will continue to monitor for progression.    Dr. Emily Filbert is Medical Director for [place] and [name].

## 2019-12-02 ENCOUNTER — Telehealth
Admit: 2019-12-02 | Discharge: 2019-12-03 | Payer: MEDICARE | Attending: Hematology & Oncology | Primary: Hematology & Oncology

## 2019-12-03 ENCOUNTER — Encounter: Payer: Medicare Other | Admitting: *Deleted

## 2019-12-03 ENCOUNTER — Other Ambulatory Visit: Payer: Self-pay

## 2019-12-03 DIAGNOSIS — I5032 Chronic diastolic (congestive) heart failure: Secondary | ICD-10-CM

## 2019-12-03 NOTE — Progress Notes (Signed)
Daily Session Note  Patient Details  Name: Ryan Allison MRN: [id number] Date of Birth: 1940-10-20 Referring Provider:     Pulmonary Rehab from 11/27/2019 in [facility] Cardiac and Pulmonary Rehab  Referring Provider Gollan      Encounter Date: 12/03/2019  Check In:  Session Check In - 12/03/19 1012      Check-In   Supervising physician immediately available to respond to emergencies See telemetry face sheet for immediately available ER MD    Location [facility]-Cardiac & Pulmonary Rehab    Staff Present Renita Papa, RN Margurite Auerbach, MS Exercise Physiologist;Amanda Oletta Darter, [place], ACSM CEP, Exercise Physiologist    Virtual Visit No    Medication changes reported     No    Fall or balance concerns reported    No    Warm-up and Cool-down Performed on first and last piece of equipment    Resistance Training Performed Yes    VAD Patient? No    PAD/SET Patient? No      Pain Assessment   Currently in Pain? No/denies              Social History   Tobacco Use  Smoking Status Former Smoker  . Types: Cigarettes  . Quit date: 05/15/1969  . Years since quitting: 50.5  Smokeless Tobacco Never Used    Goals Met:  Independence with exercise equipment Exercise tolerated well No report of cardiac concerns or symptoms Strength training completed today  Goals Unmet:  Not Applicable  Comments: Pt able to follow exercise prescription today without complaint.  Will continue to monitor for progression.    Dr. Emily Filbert is Medical Director for [place] and [name].

## 2019-12-05 ENCOUNTER — Other Ambulatory Visit: Payer: Self-pay

## 2019-12-05 ENCOUNTER — Encounter: Payer: Medicare Other | Admitting: *Deleted

## 2019-12-05 DIAGNOSIS — I5032 Chronic diastolic (congestive) heart failure: Secondary | ICD-10-CM | POA: Diagnosis not present

## 2019-12-05 NOTE — Progress Notes (Signed)
Daily Session Note  Patient Details  Name: Ryan Allison MRN: [id number] Date of Birth: 04-09-1941 Referring Provider:     Pulmonary Rehab from 11/27/2019 in [facility] Cardiac and Pulmonary Rehab  Referring Provider Gollan      Encounter Date: 12/05/2019  Check In:  Session Check In - 12/05/19 1003      Check-In   Supervising physician immediately available to respond to emergencies See telemetry face sheet for immediately available ER MD    Location [facility]-Cardiac & Pulmonary Rehab    Staff Present Renita Papa, RN BSN;Jessica Luan [place]ing, MA, RCEP, CCRP, CCET;Melissa [place] RDN, LDN    Virtual Visit No    Medication changes reported     No    Fall or balance concerns reported    No    Warm-up and Cool-down Performed on first and last piece of equipment    Resistance Training Performed Yes    VAD Patient? No    PAD/SET Patient? No      Pain Assessment   Currently in Pain? No/denies              Social History   Tobacco Use  Smoking Status Former Smoker  . Types: Cigarettes  . Quit date: 05/15/1969  . Years since quitting: 50.5  Smokeless Tobacco Never Used    Goals Met:  Independence with exercise equipment Exercise tolerated well No report of cardiac concerns or symptoms Strength training completed today  Goals Unmet:  Not Applicable  Comments: Pt able to follow exercise prescription today without complaint.  Will continue to monitor for progression.    Dr. Emily Filbert is Medical Director for [place] and [name].

## 2019-12-08 ENCOUNTER — Other Ambulatory Visit: Payer: Self-pay

## 2019-12-08 ENCOUNTER — Encounter: Payer: Medicare Other | Admitting: *Deleted

## 2019-12-08 DIAGNOSIS — I5032 Chronic diastolic (congestive) heart failure: Secondary | ICD-10-CM

## 2019-12-08 NOTE — Progress Notes (Signed)
Daily Session Note  Patient Details  Name: Ryan Allison MRN: [id number] Date of Birth: Jul 30, 1940 Referring Provider:     Pulmonary Rehab from 11/27/2019 in [facility] Cardiac and Pulmonary Rehab  Referring Provider Gollan      Encounter Date: 12/08/2019  Check In:  Session Check In - 12/08/19 1033      Check-In   Supervising physician immediately available to respond to emergencies See telemetry face sheet for immediately available ER MD    Staff Present Renita Papa, RN BSN;Joseph [address] [place], [place], ACSM CEP, Exercise Physiologist;Amanda Oletta Darter, [place], ACSM CEP, Exercise Physiologist    Virtual Visit No    Medication changes reported     No    Fall or balance concerns reported    No    Warm-up and Cool-down Performed on first and last piece of equipment    Resistance Training Performed Yes    VAD Patient? No      Pain Assessment   Currently in Pain? No/denies              Social History   Tobacco Use  Smoking Status Former Smoker  . Types: Cigarettes  . Quit date: 05/15/1969  . Years since quitting: 50.6  Smokeless Tobacco Never Used    Goals Met:  Independence with exercise equipment Exercise tolerated well No report of cardiac concerns or symptoms Strength training completed today  Goals Unmet:  Not Applicable  Comments: Pt able to follow exercise prescription today without complaint.  Will continue to monitor for progression.    Dr. Emily Filbert is Medical Director for [place] and [name].

## 2019-12-10 ENCOUNTER — Encounter: Payer: Medicare Other | Admitting: *Deleted

## 2019-12-10 ENCOUNTER — Other Ambulatory Visit: Payer: Self-pay

## 2019-12-10 DIAGNOSIS — I5032 Chronic diastolic (congestive) heart failure: Secondary | ICD-10-CM | POA: Diagnosis not present

## 2019-12-10 NOTE — Progress Notes (Signed)
Daily Session Note  Patient Details  Name: Ryan Allison MRN: [id number] Date of Birth: [birth year] Referring Provider:     Pulmonary Rehab from 11/27/2019 in [facility] Cardiac and Pulmonary Rehab  Referring Provider Gollan      Encounter Date: 12/10/2019  Check In:  Session Check In - 12/10/19 1002      Check-In   Supervising physician immediately available to respond to emergencies See telemetry face sheet for immediately available ER MD    Location [facility]-Cardiac & Pulmonary Rehab    Staff Present Renita Papa, RN BSN;Jessica [place], MA, RCEP, CCRP, CCET;Melissa [place] RDN, [place], [place], ACSM CEP, Exercise Physiologist    Virtual Visit No    Medication changes reported     No    Fall or balance concerns reported    No    Warm-up and Cool-down Performed on first and last piece of equipment    Resistance Training Performed Yes    VAD Patient? No    PAD/SET Patient? No      Pain Assessment   Currently in Pain? No/denies              Social History   Tobacco Use  Smoking Status Former Smoker  . Types: Cigarettes  . Quit date: 05/15/1969  . Years since quitting: 50.6  Smokeless Tobacco Never Used    Goals Met:  Independence with exercise equipment Exercise tolerated well No report of cardiac concerns or symptoms Strength training completed today  Goals Unmet:  Not Applicable  Comments: Pt able to follow exercise prescription today without complaint.  Will continue to monitor for progression.    Dr. Emily Filbert is Medical Director for [place] and [name].

## 2019-12-12 ENCOUNTER — Encounter: Payer: Medicare Other | Admitting: *Deleted

## 2019-12-12 ENCOUNTER — Other Ambulatory Visit: Payer: Self-pay

## 2019-12-12 DIAGNOSIS — I5032 Chronic diastolic (congestive) heart failure: Secondary | ICD-10-CM

## 2019-12-12 NOTE — Progress Notes (Signed)
Daily Session Note  Patient Details  Name: Ryan Allison MRN: [id number] Date of Birth: 04-07-1941 Referring Provider:     Pulmonary Rehab from 11/27/2019 in [facility] Cardiac and Pulmonary Rehab  Referring Provider Gollan      Encounter Date: 12/12/2019  Check In:  Session Check In - 12/12/19 0951      Check-In   Supervising physician immediately available to respond to emergencies See telemetry face sheet for immediately available ER MD    Location [facility]-Cardiac & Pulmonary Rehab    Staff Present Renita Papa, RN BSN;Joseph [address] [place], [place], [place], CCRP, CCET    Virtual Visit No    Medication changes reported     No    Fall or balance concerns reported    No    Warm-up and Cool-down Performed on first and last piece of equipment    Resistance Training Performed Yes    VAD Patient? No    PAD/SET Patient? No      Pain Assessment   Currently in Pain? No/denies              Social History   Tobacco Use  Smoking Status Former Smoker  . Types: Cigarettes  . Quit date: 05/15/1969  . Years since quitting: 50.6  Smokeless Tobacco Never Used    Goals Met:  Independence with exercise equipment Exercise tolerated well No report of cardiac concerns or symptoms Strength training completed today  Goals Unmet:  Not Applicable  Comments: Pt able to follow exercise prescription today without complaint.  Will continue to monitor for progression.    Dr. Emily Filbert is Medical Director for [place] and [name].

## 2019-12-15 ENCOUNTER — Other Ambulatory Visit: Payer: Self-pay

## 2019-12-15 ENCOUNTER — Encounter: Payer: Medicare Other | Attending: Cardiovascular Disease | Admitting: *Deleted

## 2019-12-15 DIAGNOSIS — E785 Hyperlipidemia, unspecified: Secondary | ICD-10-CM | POA: Diagnosis not present

## 2019-12-15 DIAGNOSIS — I4892 Unspecified atrial flutter: Secondary | ICD-10-CM | POA: Diagnosis not present

## 2019-12-15 DIAGNOSIS — Z79899 Other long term (current) drug therapy: Secondary | ICD-10-CM | POA: Diagnosis not present

## 2019-12-15 DIAGNOSIS — Z87891 Personal history of nicotine dependence: Secondary | ICD-10-CM | POA: Diagnosis not present

## 2019-12-15 DIAGNOSIS — I700 Atherosclerosis of aorta: Secondary | ICD-10-CM | POA: Diagnosis not present

## 2019-12-15 DIAGNOSIS — G629 Polyneuropathy, unspecified: Secondary | ICD-10-CM | POA: Insufficient documentation

## 2019-12-15 DIAGNOSIS — Z7901 Long term (current) use of anticoagulants: Secondary | ICD-10-CM | POA: Diagnosis not present

## 2019-12-15 DIAGNOSIS — G4733 Obstructive sleep apnea (adult) (pediatric): Secondary | ICD-10-CM | POA: Diagnosis not present

## 2019-12-15 DIAGNOSIS — I5032 Chronic diastolic (congestive) heart failure: Secondary | ICD-10-CM | POA: Diagnosis not present

## 2019-12-15 NOTE — Progress Notes (Signed)
Daily Session Note  Patient Details  Name: Ryan Allison MRN: [id number] Date of Birth: August 25, 1940 Referring Provider:     Pulmonary Rehab from 11/27/2019 in [facility] Cardiac and Pulmonary Rehab  Referring Provider Gollan      Encounter Date: 12/15/2019  Check In:  Session Check In - 12/15/19 1023      Check-In   Supervising physician immediately available to respond to emergencies See telemetry face sheet for immediately available ER MD    Location [facility]-Cardiac & Pulmonary Rehab    Staff Present Renita Papa, RN BSN;Joseph [address] [place], [place], ACSM CEP, Exercise Physiologist    Virtual Visit No    Medication changes reported     No    Fall or balance concerns reported    No    Warm-up and Cool-down Performed on first and last piece of equipment    Resistance Training Performed Yes    VAD Patient? No    PAD/SET Patient? No      Pain Assessment   Currently in Pain? No/denies              Social History   Tobacco Use  Smoking Status Former Smoker  . Types: Cigarettes  . Quit date: 05/15/1969  . Years since quitting: 50.6  Smokeless Tobacco Never Used    Goals Met:  Independence with exercise equipment Exercise tolerated well No report of cardiac concerns or symptoms Strength training completed today  Goals Unmet:  Not Applicable  Comments: Pt able to follow exercise prescription today without complaint.  Will continue to monitor for progression.    Dr. Emily Filbert is Medical Director for [place] and [name].

## 2019-12-17 ENCOUNTER — Encounter: Payer: Medicare Other | Admitting: *Deleted

## 2019-12-17 ENCOUNTER — Other Ambulatory Visit: Payer: Self-pay

## 2019-12-17 DIAGNOSIS — I5032 Chronic diastolic (congestive) heart failure: Secondary | ICD-10-CM

## 2019-12-17 NOTE — Progress Notes (Signed)
Daily Session Note  Patient Details  Name: Ryan Allison MRN: [id number] Date of Birth: 05-21-1940 Referring Provider:     Pulmonary Rehab from 11/27/2019 in [facility] Cardiac and Pulmonary Rehab  Referring Provider Gollan      Encounter Date: 12/17/2019  Check In:  Session Check In - 12/17/19 1122      Check-In   Supervising physician immediately available to respond to emergencies See telemetry face sheet for immediately available ER MD    Location [facility]-Cardiac & Pulmonary Rehab    Staff Present Heath Lark, RN, BSN, CCRP;Meredith Sherryll Burger, RN BSN;Joseph Foy [place], [place], ACSM CEP, Exercise Physiologist    Virtual Visit No    Medication changes reported     No    Fall or balance concerns reported    No    Warm-up and Cool-down Performed on first and last piece of equipment    Resistance Training Performed Yes    VAD Patient? No    PAD/SET Patient? No      Pain Assessment   Currently in Pain? No/denies              Social History   Tobacco Use  Smoking Status Former Smoker  . Types: Cigarettes  . Quit date: 05/15/1969  . Years since quitting: 50.6  Smokeless Tobacco Never Used    Goals Met:  Proper associated with RPD/PD & O2 Sat Independence with exercise equipment Exercise tolerated well No report of cardiac concerns or symptoms  Goals Unmet:  Not Applicable  Comments: Pt able to follow exercise prescription today without complaint.  Will continue to monitor for progression.    Dr. Emily Filbert is Medical Director for [place] and [name].

## 2019-12-19 ENCOUNTER — Other Ambulatory Visit: Payer: Self-pay

## 2019-12-19 ENCOUNTER — Encounter: Payer: Medicare Other | Admitting: *Deleted

## 2019-12-19 DIAGNOSIS — I5032 Chronic diastolic (congestive) heart failure: Secondary | ICD-10-CM

## 2019-12-19 NOTE — Progress Notes (Signed)
Daily Session Note  Patient Details  Name: Ryan Allison MRN: [id number] Date of Birth: 03/16/1941 Referring Provider:     Pulmonary Rehab from 11/27/2019 in [facility] Cardiac and Pulmonary Rehab  Referring Provider Gollan      Encounter Date: 12/19/2019  Check In:  Session Check In - 12/19/19 0955      Check-In   Supervising physician immediately available to respond to emergencies See telemetry face sheet for immediately available ER MD    Location [facility]-Cardiac & Pulmonary Rehab    Staff Present Renita Papa, RN BSN;Joseph Lou [place], [place] Exercise Physiologist    Virtual Visit No    Medication changes reported     No    Fall or balance concerns reported    No    Warm-up and Cool-down Performed on first and last piece of equipment    Resistance Training Performed Yes    VAD Patient? No    PAD/SET Patient? No      Pain Assessment   Currently in Pain? No/denies              Social History   Tobacco Use  Smoking Status Former Smoker  . Types: Cigarettes  . Quit date: 05/15/1969  . Years since quitting: 50.6  Smokeless Tobacco Never Used    Goals Met:  Independence with exercise equipment Exercise tolerated well No report of cardiac concerns or symptoms Strength training completed today  Goals Unmet:  Not Applicable  Comments: Pt able to follow exercise prescription today without complaint.  Will continue to monitor for progression.    Dr. Emily Filbert is Medical Director for [place] and [name].

## 2019-12-22 ENCOUNTER — Encounter: Payer: Medicare Other | Admitting: *Deleted

## 2019-12-22 ENCOUNTER — Other Ambulatory Visit: Payer: Self-pay

## 2019-12-22 DIAGNOSIS — I5032 Chronic diastolic (congestive) heart failure: Secondary | ICD-10-CM

## 2019-12-22 NOTE — Progress Notes (Signed)
Daily Session Note  Patient Details  Name: Ryan Allison MRN: [id number] Date of Birth: 07/05/1940 Referring Provider:     Pulmonary Rehab from 11/27/2019 in [facility] Cardiac and Pulmonary Rehab  Referring Provider Gollan      Encounter Date: 12/22/2019  Check In:  Session Check In - 12/22/19 1036      Check-In   Supervising physician immediately available to respond to emergencies See telemetry face sheet for immediately available ER MD    Location [facility]-Cardiac & Pulmonary Rehab    Staff Present Ryan Papa, RN BSN;Ryan Allison, ACSM CEP, Exercise Physiologist    Virtual Visit No    Medication changes reported     No    Fall or balance concerns reported    No    Warm-up and Cool-down Performed on first and last piece of equipment    Resistance Training Performed Yes    VAD Patient? No    PAD/SET Patient? No      Pain Assessment   Currently in Pain? No/denies              Social History   Tobacco Use  Smoking Status Former Smoker  . Types: Cigarettes  . Quit date: 05/15/1969  . Years since quitting: 50.6  Smokeless Tobacco Never Used    Goals Met:  Independence with exercise equipment Exercise tolerated well No report of cardiac concerns or symptoms Strength training completed today  Goals Unmet:  Not Applicable  Comments: Pt able to follow exercise prescription today without complaint.  Will continue to monitor for progression.    Dr. Emily Allison is Medical Director for [name] and [name].

## 2019-12-24 ENCOUNTER — Encounter: Payer: Medicare Other | Admitting: *Deleted

## 2019-12-24 ENCOUNTER — Other Ambulatory Visit: Payer: Self-pay

## 2019-12-24 ENCOUNTER — Encounter: Payer: Self-pay | Admitting: *Deleted

## 2019-12-24 DIAGNOSIS — I5032 Chronic diastolic (congestive) heart failure: Secondary | ICD-10-CM | POA: Diagnosis not present

## 2019-12-24 NOTE — Progress Notes (Signed)
Pulmonary Individual Treatment Plan  Patient Details  Name: Ryan Allison MRN: [id number] Date of Birth: [birth year] Referring Provider:     Pulmonary Rehab from 11/27/2019 in [facility] Cardiac and Pulmonary Rehab  Referring Provider Gollan      Initial Encounter Date:    Pulmonary Rehab from 11/27/2019 in [facility] Cardiac and Pulmonary Rehab  Date 11/27/19      Visit Diagnosis: Heart failure, diastolic, chronic ([place])  Patient's Home Medications on Admission:  Current Outpatient Medications:  .  acetaminophen (TYLENOL) 500 MG tablet, Take 500 mg by mouth daily as needed for fever or pain. , Disp: , Rfl:  .  bicalutamide (CASODEX) 50 MG tablet, Take 50 mg by mouth daily. (Patient not taking: Reported on 11/24/2019), Disp: , Rfl:  .  cyanocobalamin 1000 MCG tablet, Take 1,000 mcg by mouth daily., Disp: , Rfl:  .  furosemide (LASIX) 20 MG tablet, Take 1 tablet (20 mg total) by mouth daily., Disp: 30 tablet, Rfl: 3 .  gabapentin (NEURONTIN) 600 MG tablet, Take 600 mg by mouth 5 (five) times daily. , Disp: , Rfl:  .  metoprolol tartrate (LOPRESSOR) 25 MG tablet, Take 0.5 tablet (12.5 mg) by mouth twice daily, Disp: 90 tablet, Rfl: 3 .  omeprazole (PRILOSEC) 40 MG capsule, Take 40 mg by mouth daily., Disp: , Rfl:  .  rivaroxaban (XARELTO) 20 MG TABS tablet, Take 1 tablet (20 mg total) by mouth daily with supper., Disp: 90 tablet, Rfl: 3 .  simvastatin (ZOCOR) 20 MG tablet, Take 20 mg by mouth at bedtime. , Disp: , Rfl:  .  tamsulosin (FLOMAX) 0.4 MG CAPS capsule, Take 0.4 mg by mouth daily. , Disp: , Rfl:  .  traMADol (ULTRAM) 50 MG tablet, Take 50 mg by mouth every 6 (six) hours as needed for moderate pain. , Disp: , Rfl:   Past Medical History: Past Medical History:  Diagnosis Date  . Aortic atherosclerosis ([place])   . Arthritis   . Atrial flutter ([place])    a. diagnosed 05/2018; b. not on [place] 2/2 recurrent life threatening GI bleed; c. CHADS2VASc => 3 (age x 2, vascular disease)  . Cancer [facility])  2011   prostate  . GI bleed   . History of echocardiogram    a. TTE 05/2018: EF of 60-65%, normal wall motion, mild MR, left atrium normal in size, RVSF normal, PASP 33 mmHg, rhythm was atrial flutter  . History of GI diverticular bleed    extended stay in the [facility] ICU due to GI bleeding prior to 2016  . HLD (hyperlipidemia)   . Hyperlipidemia   . Hypertension   . Meniscus tear   . OSA on CPAP   . Peripheral neuropathy     Tobacco Use: Social History   Tobacco Use  Smoking Status Former Smoker  . Types: Cigarettes  . Quit date: 05/15/1969  . Years since quitting: 50.6  Smokeless Tobacco Never Used    Labs: Recent Chemical engineer    Labs for ITP Cardiac and Pulmonary Rehab Latest Ref Rng & Units 06/06/2018   Hemoglobin A1c 4.8 - 5.6 % 5.5       Pulmonary Assessment Scores:  Pulmonary Assessment Scores    Row Name 11/27/19 1525         ADL UCSD   SOB Score total 89     Rest 2     Walk 4     Stairs 5     Bath 5     Dress  3     Shop 4       CAT Score   CAT Score 17       mMRC Score   mMRC Score 3            UCSD: Self-administered rating of dyspnea associated with activities of daily living (ADLs) 6-point scale (0 = "not at all" to 5 = "maximal or unable to do because of breathlessness")  Scoring Scores range from 0 to 120.  Minimally important difference is 5 units  CAT: CAT can identify the health impairment of COPD patients and is better correlated with disease progression.  CAT has a scoring range of zero to 40. The CAT score is classified into four groups of low (less than 10), medium (10 - 20), high (21-30) and very high (31-40) based on the impact level of disease on health status. A CAT score over 10 suggests significant symptoms.  A worsening CAT score could be explained by an exacerbation, poor medication adherence, poor inhaler technique, or progression of COPD or comorbid conditions.  CAT MCID is 2 points  mMRC: mMRC (Modified Medical  Research Council) Dyspnea Scale is used to assess the degree of baseline functional disability in patients of respiratory disease due to dyspnea. No minimal important difference is established. A decrease in score of 1 point or greater is considered a positive change.   Pulmonary Function Assessment:   Exercise Target Goals: Exercise Program Goal: Individual exercise prescription set using results from initial 6 min walk test and THRR while considering  patient's activity barriers and safety.   Exercise Prescription Goal: Initial exercise prescription builds to 30-45 minutes a day of aerobic activity, 2-3 days per week.  Home exercise guidelines will be given to patient during program as part of exercise prescription that the participant will acknowledge.  Education: Aerobic Exercise & Resistance Training: - Gives group verbal and written instruction on the various components of exercise. Focuses on aerobic and resistive training programs and the benefits of this training and how to safely progress through these programs..   Education: Exercise & Equipment Safety: - Individual verbal instruction and demonstration of equipment use and safety with use of the equipment.   Pulmonary Rehab from 12/17/2019 in [facility] Cardiac and Pulmonary Rehab  Date 11/27/19  Educator AS  Instruction Review Code 1- Verbalizes Understanding      Education: Exercise Physiology & General Exercise Guidelines: - Group verbal and written instruction with models to review the exercise physiology of the cardiovascular system and associated critical values. Provides general exercise guidelines with specific guidelines to those with heart or lung disease.    Education: Flexibility, Balance, Mind/Body Relaxation: Provides group verbal/written instruction on the benefits of flexibility and balance training, including mind/body exercise modes such as yoga, pilates and tai chi.  Demonstration and skill practice  provided.   Activity Barriers & Risk Stratification:  Activity Barriers & Cardiac Risk Stratification - 11/24/19 1149      Activity Barriers & Cardiac Risk Stratification   Activity Barriers Shortness of Breath;Deconditioning           6 Minute Walk:  6 Minute Walk    Row Name 11/27/19 1518         6 Minute Walk   Distance 1090 feet     Walk Time 6 minutes     # of Rest Breaks 0     MPH 2.06     METS 2.4     RPE 13  Perceived Dyspnea  2     VO2 Peak 8.5     Symptoms No     Resting HR 81 bpm     Resting BP 102/58     Resting Oxygen Saturation  97 %     Exercise Oxygen Saturation  during 6 min walk 97 %     Max Ex. HR 99 bpm     Max Ex. BP 124/58     2 Minute Post BP 98/54       Interval HR   1 Minute HR 92     2 Minute HR 99     3 Minute HR 99     4 Minute HR 92     6 Minute HR 93     2 Minute Post HR 88     Interval Heart Rate? Yes       Interval Oxygen   Interval Oxygen? Yes     Baseline Oxygen Saturation % 97 %     1 Minute Oxygen Saturation % 97 %     1 Minute Liters of Oxygen 0 L     2 Minute Oxygen Saturation % 98 %     2 Minute Liters of Oxygen 0 L     3 Minute Oxygen Saturation % 97 %     3 Minute Liters of Oxygen 0 L     4 Minute Oxygen Saturation % 98 %     4 Minute Liters of Oxygen 0 L     5 Minute Oxygen Saturation % 98 %     5 Minute Liters of Oxygen 0 L     6 Minute Oxygen Saturation % 98 %     6 Minute Liters of Oxygen 0 L     2 Minute Post Oxygen Saturation % 98 %     2 Minute Post Liters of Oxygen 0 L           Oxygen Initial Assessment:  Oxygen Initial Assessment - 11/24/19 1134      Home Oxygen   Home Oxygen Device None    Sleep Oxygen Prescription None    Home Exercise Oxygen Prescription None    Home at Rest Exercise Oxygen Prescription None      Intervention   Short Term Goals To learn and demonstrate proper pursed lip breathing techniques or other breathing techniques.    Long  Term Goals Exhibits proper breathing  techniques, such as pursed lip breathing or other method taught during program session           Oxygen Re-Evaluation:   Oxygen Discharge (Final Oxygen Re-Evaluation):   Initial Exercise Prescription:  Initial Exercise Prescription - 11/27/19 1500      Date of Initial Exercise RX and Referring Provider   Date 11/27/19    Referring Provider Gollan      Treadmill   MPH 1.7    Grade 0.5    Minutes 15    METs 2.4      NuStep   Level 2    SPM 80    Minutes 15    METs 2.4      REL-XR   Level 2    Speed 50    Minutes 15    METs 2.4      T5 Nustep   Level 1    SPM 80    Minutes 15    METs 2.4      Prescription Details   Frequency (times per week) 3  Duration Progress to 30 minutes of continuous aerobic without signs/symptoms of physical distress      Intensity   THRR 40-80% of Max Heartrate 106-131    Ratings of Perceived Exertion 11-15    Perceived Dyspnea 0-4      Resistance Training   Training Prescription Yes    Weight 3lb    Reps 10-15           Perform Capillary Blood Glucose checks as needed.  Exercise Prescription Changes:  Exercise Prescription Changes    Row Name 12/02/19 1300 12/17/19 1200           Response to Exercise   Blood Pressure (Admit) 120/70 122/62      Blood Pressure (Exercise) 130/58 128/64      Blood Pressure (Exit) 110/70 124/80      Heart Rate (Admit) 66 bpm 62 bpm      Heart Rate (Exercise) 96 bpm 101 bpm      Heart Rate (Exit) 92 bpm 94 bpm      Oxygen Saturation (Admit) 98 % 97 %      Oxygen Saturation (Exercise) 96 % 96 %      Oxygen Saturation (Exit) 98 % 97 %      Rating of Perceived Exertion (Exercise) 13 13      Perceived Dyspnea (Exercise) 2 2      Duration Progress to 30 minutes of  aerobic without signs/symptoms of physical distress Progress to 30 minutes of  aerobic without signs/symptoms of physical distress      Intensity THRR unchanged THRR unchanged        Progression   Progression Continue to  progress workloads to maintain intensity without signs/symptoms of physical distress. Continue to progress workloads to maintain intensity without signs/symptoms of physical distress.      Average METs -- 2.73        Resistance Training   Training Prescription Yes Yes      Weight 3 lb 3 lb      Reps 10-15 10-15        Interval Training   Interval Training No No        Treadmill   MPH 1.7 1.7      Grade 0.5 0.5      Minutes 15 15      METs 2.42 2.42        NuStep   Level -- 1      Minutes -- 15      METs -- 2.8        REL-XR   Level 2 2      Minutes 15 15             Exercise Comments:   Exercise Goals and Review:  Exercise Goals    Row Name 11/27/19 1524             Exercise Goals   Increase Physical Activity Yes       Intervention Provide advice, education, support and counseling about physical activity/exercise needs.;Develop an individualized exercise prescription for aerobic and resistive training based on initial evaluation findings, risk stratification, comorbidities and participant's personal goals.       Expected Outcomes Short Term: Attend rehab on a regular basis to increase amount of physical activity.;Long Term: Add in home exercise to make exercise part of routine and to increase amount of physical activity.;Long Term: Exercising regularly at least 3-5 days a week.       Increase Strength and Stamina Yes  Intervention Provide advice, education, support and counseling about physical activity/exercise needs.;Develop an individualized exercise prescription for aerobic and resistive training based on initial evaluation findings, risk stratification, comorbidities and participant's personal goals.       Expected Outcomes Short Term: Increase workloads from initial exercise prescription for resistance, speed, and METs.;Short Term: Perform resistance training exercises routinely during rehab and add in resistance training at home;Long Term: Improve  cardiorespiratory fitness, muscular endurance and strength as measured by increased METs and functional capacity (6MWT)       Able to understand and use rate of perceived exertion (RPE) scale Yes       Intervention Provide education and explanation on how to use RPE scale       Expected Outcomes Short Term: Able to use RPE daily in rehab to express subjective intensity level;Long Term:  Able to use RPE to guide intensity level when exercising independently       Able to understand and use Dyspnea scale Yes       Intervention Provide education and explanation on how to use Dyspnea scale       Expected Outcomes Short Term: Able to use Dyspnea scale daily in rehab to express subjective sense of shortness of breath during exertion;Long Term: Able to use Dyspnea scale to guide intensity level when exercising independently       Knowledge and understanding of Target Heart Rate Range (THRR) Yes       Intervention Provide education and explanation of THRR including how the numbers were predicted and where they are located for reference       Expected Outcomes Short Term: Able to state/look up THRR;Short Term: Able to use daily as guideline for intensity in rehab;Long Term: Able to use THRR to govern intensity when exercising independently       Able to check pulse independently Yes       Intervention Provide education and demonstration on how to check pulse in carotid and radial arteries.;Review the importance of being able to check your own pulse for safety during independent exercise       Expected Outcomes Short Term: Able to explain why pulse checking is important during independent exercise;Long Term: Able to check pulse independently and accurately       Understanding of Exercise Prescription Yes       Intervention Provide education, explanation, and written materials on patient's individual exercise prescription       Expected Outcomes Short Term: Able to explain program exercise prescription;Long  Term: Able to explain home exercise prescription to exercise independently              Exercise Goals Re-Evaluation :  Exercise Goals Re-Evaluation    Row Name 12/01/19 1104 12/17/19 1228           Exercise Goal Re-Evaluation   Exercise Goals Review Increase Physical Activity;Increase Strength and Stamina;Able to understand and use rate of perceived exertion (RPE) scale;Able to understand and use Dyspnea scale;Knowledge and understanding of Target Heart Rate Range (THRR);Understanding of Exercise Prescription Increase Physical Activity;Increase Strength and Stamina;Able to understand and use rate of perceived exertion (RPE) scale;Able to understand and use Dyspnea scale;Knowledge and understanding of Target Heart Rate Range (THRR);Understanding of Exercise Prescription      Comments Reviewed RPE and dyspnea scales, THR and program prescription with pt today.  Pt voiced understanding and was given a copy of goals to take home. Gene has been doing well in rehab. RPE ranges have been within appropriate ranges.  Oxygen is maintained well during exericse. Will continue to progress workloads when ready. Continue to monitor.      Expected Outcomes Short: Use RPE daily to regulate intensity. Long: Follow program prescription in THR. Short: Continue attending rehab regularly Long: Progress MET level and increase strength/ stamina             Discharge Exercise Prescription (Final Exercise Prescription Changes):  Exercise Prescription Changes - 12/17/19 1200      Response to Exercise   Blood Pressure (Admit) 122/62    Blood Pressure (Exercise) 128/64    Blood Pressure (Exit) 124/80    Heart Rate (Admit) 62 bpm    Heart Rate (Exercise) 101 bpm    Heart Rate (Exit) 94 bpm    Oxygen Saturation (Admit) 97 %    Oxygen Saturation (Exercise) 96 %    Oxygen Saturation (Exit) 97 %    Rating of Perceived Exertion (Exercise) 13    Perceived Dyspnea (Exercise) 2    Duration Progress to 30 minutes of   aerobic without signs/symptoms of physical distress    Intensity THRR unchanged      Progression   Progression Continue to progress workloads to maintain intensity without signs/symptoms of physical distress.    Average METs 2.73      Resistance Training   Training Prescription Yes    Weight 3 lb    Reps 10-15      Interval Training   Interval Training No      Treadmill   MPH 1.7    Grade 0.5    Minutes 15    METs 2.42      NuStep   Level 1    Minutes 15    METs 2.8      REL-XR   Level 2    Minutes 15           Nutrition:  Target Goals: Understanding of nutrition guidelines, daily intake of sodium <1557m, cholesterol <2047m calories 30% from fat and 7% or less from saturated fats, daily to have 5 or more servings of fruits and vegetables.  Education: Controlling Sodium/Reading Food Labels -Group verbal and written material supporting the discussion of sodium use in heart healthy nutrition. Review and explanation with models, verbal and written materials for utilization of the food label.   Education: General Nutrition Guidelines/Fats and Fiber: -Group instruction provided by verbal, written material, models and posters to present the general guidelines for heart healthy nutrition. Gives an explanation and review of dietary fats and fiber.   Biometrics:  Pre Biometrics - 11/27/19 1525      Pre Biometrics   Height _0  (1.778 m)    Weight 170 lb 12.8 oz (77.5 kg)    BMI (Calculated) 24.51    Single Leg Stand 19.13 seconds            Nutrition Therapy Plan and Nutrition Goals:  Nutrition Therapy & Goals - 12/22/19 1048      Nutrition Therapy   Diet High kcal, high protein - ileostomy MNT    Protein (specify units) 100-105g    Fiber --   Low fiber   Whole Grain Foods -1 servings    Saturated Fats 12 max. grams    Fruits and Vegetables 5 servings/day   peeled   Sodium 1.5 grams      Personal Nutrition Goals   Nutrition Goal ST: add boost BID,  include more soluble fiber, move big meal to earlier in the day. LT: manage ostomy/slow  transit time and decrease food stress, sleep through the night, gain weight/meet needs    Comments Pt primary diagnosis for Pulmonary Rehab is CHF. Hx of lower GI bleeding, recurrent prostate cancer, neuropathy, osteopenia, B12 deficiency, OSA on CPAP, diverticulosis, HLD. 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 Pt also reports having dizzy spells - likely due to both malnutrition and dehydration - encouraged pt to drink water during the day to ensure BP doesn't drop too low. Current Relevant Medications: ultram, zocor, prilosec, casodex. Discussed ileostomy MNT. Pt is nervous to eat and has lots of anxiety over eating. Sugested nutritional shakes BID - pt reports having boost, but has not started taking it yet. Pt reports getting up frequently in the middle of the night - discussed moving big meal to earlier in the day. Pt reports changing his bag often and believes he is not absorbing nutrients - discussed expectations for consistency and frequency and then discussed MNT to slow transit time such as increased soluble fiber. Will get more information on exact sugar recommendations and food blockage guidelines.      Intervention Plan   Intervention Prescribe, educate and counsel regarding individualized specific dietary modifications aiming towards targeted core components such as weight, hypertension, lipid management, diabetes, heart failure and other comorbidities.;Nutrition  handout(s) given to patient.    Expected Outcomes Short Term Goal: Understand basic principles of dietary content, such as calories, fat, sodium, cholesterol and nutrients.;Short Term Goal: A plan has been developed with personal nutrition goals set during dietitian appointment.;Long Term Goal: Adherence to prescribed nutrition plan.           Nutrition Assessments:   MEDIFICTS Score Key:          ?70 Need to make dietary changes          40-70 Heart Healthy Diet         ? 40 Therapeutic Level Cholesterol Diet  Nutrition Goals Re-Evaluation:   Nutrition Goals Discharge (Final Nutrition Goals Re-Evaluation):   Psychosocial: Target Goals: Acknowledge presence or absence of significant depression and/or stress, maximize coping skills, provide positive support system. Participant is able to verbalize types and ability to use techniques and skills needed for reducing stress and depression.   Education: Depression - Provides group verbal and written instruction on the correlation between heart/lung disease and depressed mood, treatment options, and the stigmas associated with seeking treatment.   Education: Sleep Hygiene -Provides group verbal and written instruction about how sleep can affect your health.  Define sleep hygiene, discuss sleep cycles and impact of sleep habits. Review good sleep hygiene tips.    Education: Stress and Anxiety: - Provides group verbal and written instruction about the health risks of elevated stress and causes of high stress.  Discuss the correlation between heart/lung disease and anxiety and treatment options. Review healthy ways to manage with stress and anxiety.   Initial Review & Psychosocial Screening:  Initial Psych Review & Screening - 11/24/19 1152      Initial Review   Current issues with  Current Stress Concerns    Source of Stress Concerns Chronic Illness;Unable to perform yard/household activities;Unable to participate in former interests  or hobbies    Comments Used to enjoy golf, has not been able to do since the surgery.      Family Dynamics   Good Support System? Yes   Son, lady next door"watch out for each other" , neighbors as needed  [place] family     Barriers   Psychosocial barriers to participate in program There are no identifiable barriers or psychosocial needs.;The patient should benefit from training in stress management and relaxation.      Screening Interventions   Interventions Encouraged to exercise    Expected Outcomes Short Term goal: Utilizing psychosocial counselor, staff and physician to assist with identification of specific Stressors or current issues interfering with healing process. Setting desired goal for each stressor or current issue identified.;Long Term Goal: Stressors or current issues are controlled or eliminated.;Short Term goal: Identification and review with participant of any Quality of Life or Depression concerns found by scoring the questionnaire.;Long Term goal: The participant improves quality of Life and PHQ9 Scores as seen by post scores and/or verbalization of changes           Quality of Life Scores:  Quality of Life - 11/27/19 1526      Quality of Life   Select Quality of Life      Quality of Life Scores   Health/Function Pre 14.65 %    Socioeconomic Pre 23 %    Psych/Spiritual Pre 12.64 %    Family Pre 24 %    GLOBAL Pre 17.07 %          Scores of 19 and below usually indicate a poorer quality of life in these areas.  A difference of  2-3 points is a clinically meaningful difference.  A difference of 2-3 points in the total score of the Quality of Life Index has been associated with significant improvement in overall quality of life, self-image, physical symptoms, and general health in studies assessing change in quality of life.  PHQ-9: Recent Review Flowsheet Data    Depression screen [facility] 2/9 11/27/2019   Decreased Interest 3    Down, Depressed, Hopeless 3    PHQ  - 2 Score 6   Altered sleeping 3    Tired, decreased energy 3    Change in appetite 2   Feeling bad or failure about yourself  0   Trouble concentrating 3    Moving slowly or fidgety/restless 1   Suicidal thoughts 0   PHQ-9 Score 18   Difficult doing work/chores Very difficult     Interpretation of Total Score  Total Score Depression Severity:  1-4 = Minimal depression, 5-9 = Mild depression, 10-14 = Moderate depression, 15-19 = Moderately severe depression, 20-27 = Severe depression   Psychosocial Evaluation and Intervention:  Psychosocial Evaluation - 11/24/19 1214      Psychosocial Evaluation & Interventions   Interventions Encouraged to exercise with the program and follow exercise prescription    Comments Gene has no barriers to starting the program.  He stated that he is excited to get started.  He has had several health issues in the past year. 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 He is looking forward to building his core strenght back and having more energy. HIs sleep cycle has been disrupted since his ileostomy surgery. He is catching naps during the day to help. He has a great  support group of people from his sons that visit ofter, a neoghbor that "they keep an eye out on each other" , other neighbors and his church family. Gene is hoping this program will help him gain backthe ability to do some of his life activities. He was playing golf before his surgery. He is looking forward to meeting the RD. He should do well in the program.    Expected Outcomes STG: attend his scheduled sessions.     LTG: have more stamina and energy when graduates from the progam and be able to maintain what he built during the program.    Continue Psychosocial Services  Follow up required by staff           Psychosocial Re-Evaluation:   Psychosocial Discharge (Final Psychosocial Re-Evaluation):   Education: Education Goals: Education classes will be provided on a  weekly basis, covering required topics. Participant will state understanding/return demonstration of topics presented.  Learning Barriers/Preferences:  Learning Barriers/Preferences - 11/24/19 1159      Learning Barriers/Preferences   Learning Barriers None    Learning Preferences None           General Pulmonary Education Topics:  Infection Prevention: - Provides verbal and written material to individual with discussion of infection control including proper hand washing and proper equipment cleaning during exercise session.   Pulmonary Rehab from 12/17/2019 in [facility] Cardiac and Pulmonary Rehab  Date 11/27/19  Educator As  Instruction Review Code 1- Verbalizes Understanding      Falls Prevention: - Provides verbal and written material to individual with discussion of falls prevention and safety.   Pulmonary Rehab from 12/17/2019 in [facility] Cardiac and Pulmonary Rehab  Date 11/27/19  Educator AS  Instruction Review Code 1- Verbalizes Understanding      Chronic Lung Diseases: - Group verbal and written instruction to review updates, respiratory medications, advancements in procedures and treatments. Discuss use of supplemental oxygen including available portable oxygen systems, continuous and intermittent flow rates, concentrators, personal use and safety guidelines. Review proper use of inhaler and spacers. Provide informative websites for self-education.    Pulmonary Rehab from 12/17/2019 in [facility] Cardiac and Pulmonary Rehab  Date 12/03/19  Educator [facility]  Instruction Review Code 1- Verbalizes Understanding      Energy Conservation: - Provide group verbal and written instruction for methods to conserve energy, plan and organize activities. Instruct on pacing techniques, use of adaptive equipment and posture/positioning to relieve shortness of breath.   Triggers and Exacerbations: - Group verbal and written instruction to review types of environmental triggers and ways to prevent  exacerbations. Discuss weather changes, air quality and the benefits of nasal washing. Review warning signs and symptoms to help prevent infections. Discuss techniques for effective airway clearance, coughing, and vibrations.   AED/CPR: - Group verbal and written instruction with the use of models to demonstrate the basic use of the AED with the basic ABC's of resuscitation.   Anatomy and Physiology of the Lungs: - Group verbal and written instruction with the use of models to provide basic lung anatomy and physiology related to function, structure and complications of lung disease.   Anatomy & Physiology of the Heart: - Group verbal and written instruction and models provide basic cardiac anatomy and physiology, with the coronary electrical and arterial systems. Review of Valvular disease and Heart Failure   Cardiac Medications: - Group verbal and written instruction to review commonly prescribed medications for heart disease. Reviews the medication, class of the drug, and side  effects.   Pulmonary Rehab from 12/17/2019 in [facility] Cardiac and Pulmonary Rehab  Date 12/10/19  Educator SB  Instruction Review Code 1- Verbalizes Understanding      Other: -Provides group and verbal instruction on various topics (see comments)   Knowledge Questionnaire Score:  Knowledge Questionnaire Score - 11/27/19 1527      Knowledge Questionnaire Score   Pre Score 21/26 card 12/18 pulm            Core Components/Risk Factors/Patient Goals at Admission:  Personal Goals and Risk Factors at Admission - 11/27/19 1530      Core Components/Risk Factors/Patient Goals on Admission    Weight Management Yes;Weight Maintenance    Intervention Weight Management: Develop a combined nutrition and exercise program designed to reach desired caloric intake, while maintaining appropriate intake of nutrient and fiber, sodium and fats, and appropriate energy expenditure required for the weight goal.;Weight  Management: Provide education and appropriate resources to help participant work on and attain dietary goals.    Admit Weight 170 lb 12.8 oz (77.5 kg)    Goal Weight: Short Term 170 lb (77.1 kg)    Goal Weight: Long Term 175 lb (79.4 kg)    Expected Outcomes Short Term: Continue to assess and modify interventions until short term weight is achieved;Long Term: Adherence to nutrition and physical activity/exercise program aimed toward attainment of established weight goal;Weight Maintenance: Understanding of the daily nutrition guidelines, which includes 25-35% calories from fat, 7% or less cal from saturated fats, less than 293m cholesterol, less than 1.5gm of sodium, & 5 or more servings of fruits and vegetables daily    Intervention Provide a combined exercise and nutrition program that is supplemented with education, support and counseling about heart failure. Directed toward relieving symptoms such as shortness of breath, decreased exercise tolerance, and extremity edema.    Expected Outcomes Improve functional capacity of life;Short term: Attendance in program 2-3 days a week with increased exercise capacity. Reported lower sodium intake. Reported increased fruit and vegetable intake. Reports medication compliance.;Short term: Daily weights obtained and reported for increase. Utilizing diuretic protocols set by physician.;Long term: Adoption of self-care skills and reduction of barriers for early signs and symptoms recognition and intervention leading to self-care maintenance.    Intervention Provide education and support for participant on nutrition & aerobic/resistive exercise along with prescribed medications to achieve LDL <730m HDL >4030m   Expected Outcomes Short Term: Participant states understanding of desired cholesterol values and is compliant with medications prescribed. Participant is following exercise prescription and nutrition guidelines.;Long Term: Cholesterol controlled with  medications as prescribed, with individualized exercise RX and with personalized nutrition plan. Value goals: LDL < 55m32mDL > 40 mg.           Education:Diabetes - Individual verbal and written instruction to review signs/symptoms of diabetes, desired ranges of glucose level fasting, after meals and with exercise. Acknowledge that pre and post exercise glucose checks will be done for 3 sessions at entry of program.   Education: Know Your Numbers and Risk Factors: -Group verbal and written instruction about important numbers in your health.  Discussion of what are risk factors and how they play a role in the disease process.  Review of Cholesterol, Blood Pressure, Diabetes, and BMI and the role they play in your overall health.   Core Components/Risk Factors/Patient Goals Review:    Core Components/Risk Factors/Patient Goals at Discharge (Final Review):    ITP Comments:  ITP Comments    Row Name  11/24/19 1131 12/24/19 0750         ITP Comments Virtual Orientation completed today . Has EP Eval and gym orientationscheduled on 11/27/19. 30 Day review completed. Medical Director ITP review done, changes made as directed, and signed approval by Medical Director.             Comments:

## 2019-12-24 NOTE — Progress Notes (Signed)
Daily Session Note  Patient Details  Name: JONPAUL LUMM MRN: [id number] Date of Birth: 1940-12-05 Referring Provider:     Pulmonary Rehab from 11/27/2019 in [facility] Cardiac and Pulmonary Rehab  Referring Provider Gollan      Encounter Date: 12/24/2019  Check In:  Session Check In - 12/24/19 0927      Check-In   Supervising physician immediately available to respond to emergencies See telemetry face sheet for immediately available ER MD    Location [facility]-Cardiac & Pulmonary Rehab    Staff Present Renita Papa, RN BSN;Joseph [address] [place], [place], [place], CCRP, [place], [place], ACSM CEP, Exercise Physiologist    Virtual Visit No    Medication changes reported     No    Fall or balance concerns reported    No    Warm-up and Cool-down Performed on first and last piece of equipment    Resistance Training Performed Yes    VAD Patient? No    PAD/SET Patient? No      Pain Assessment   Currently in Pain? No/denies              Social History   Tobacco Use  Smoking Status Former Smoker  . Types: Cigarettes  . Quit date: 05/15/1969  . Years since quitting: 50.6  Smokeless Tobacco Never Used    Goals Met:  Independence with exercise equipment Exercise tolerated well No report of cardiac concerns or symptoms Strength training completed today  Goals Unmet:  Not Applicable  Comments: Pt able to follow exercise prescription today without complaint.  Will continue to monitor for progression.    Dr. Emily Filbert is Medical Director for [place] and [name].

## 2019-12-26 ENCOUNTER — Other Ambulatory Visit: Payer: Self-pay

## 2019-12-26 ENCOUNTER — Encounter: Payer: Medicare Other | Admitting: *Deleted

## 2019-12-26 DIAGNOSIS — I5032 Chronic diastolic (congestive) heart failure: Secondary | ICD-10-CM

## 2019-12-26 NOTE — Progress Notes (Signed)
Daily Session Note  Patient Details  Name: Ryan Allison MRN: [id number] Date of Birth: 1941/03/18 Referring Provider:     Pulmonary Rehab from 11/27/2019 in [facility] Cardiac and Pulmonary Rehab  Referring Provider Gollan      Encounter Date: 12/26/2019  Check In:  Session Check In - 12/26/19 1001      Check-In   Supervising physician immediately available to respond to emergencies See telemetry face sheet for immediately available ER MD    Location [facility]-Cardiac & Pulmonary Rehab    Staff Present Renita Papa, RN BSN;Joseph [address] [place], [place], [place], CCRP, CCET    Virtual Visit No    Medication changes reported     No    Fall or balance concerns reported    No    Warm-up and Cool-down Performed on first and last piece of equipment    Resistance Training Performed Yes    VAD Patient? No    PAD/SET Patient? No      Pain Assessment   Currently in Pain? No/denies              Social History   Tobacco Use  Smoking Status Former Smoker  . Types: Cigarettes  . Quit date: 05/15/1969  . Years since quitting: 50.6  Smokeless Tobacco Never Used    Goals Met:  Independence with exercise equipment Exercise tolerated well No report of cardiac concerns or symptoms Strength training completed today  Goals Unmet:  Not Applicable  Comments: Pt able to follow exercise prescription today without complaint.  Will continue to monitor for progression.    Dr. Emily Filbert is Medical Director for [place] and [name].

## 2019-12-29 ENCOUNTER — Other Ambulatory Visit: Payer: Self-pay

## 2019-12-29 ENCOUNTER — Encounter: Payer: Medicare Other | Admitting: *Deleted

## 2019-12-29 DIAGNOSIS — I5032 Chronic diastolic (congestive) heart failure: Secondary | ICD-10-CM | POA: Diagnosis not present

## 2019-12-29 NOTE — Progress Notes (Signed)
Daily Session Note  Patient Details  Name: Ryan Allison MRN: [id number] Date of Birth: 1941/01/04 Referring Provider:     Pulmonary Rehab from 11/27/2019 in [facility] Cardiac and Pulmonary Rehab  Referring Provider Gollan      Encounter Date: 12/29/2019  Check In:      Social History   Tobacco Use  Smoking Status Former Smoker  . Types: Cigarettes  . Quit date: 05/15/1969  . Years since quitting: 50.6  Smokeless Tobacco Never Used    Goals Met:  Proper associated with RPD/PD & O2 Sat Independence with exercise equipment Exercise tolerated well Personal goals reviewed Queuing for purse lip breathing  Goals Unmet:  Not Applicable  Comments: Reviewed home exercise with pt today.  Pt plans to consider [employer] for exercise.  Reviewed THR, pulse, RPE, sign and symptoms, pulse oximetery and when to call 911 or MD.  Also discussed weather considerations and indoor options.  Pt voiced understanding.    Dr. Emily Filbert is Medical Director for [place] and [name].

## 2019-12-31 ENCOUNTER — Encounter: Payer: Medicare Other | Admitting: *Deleted

## 2019-12-31 ENCOUNTER — Other Ambulatory Visit: Payer: Self-pay

## 2019-12-31 DIAGNOSIS — I5032 Chronic diastolic (congestive) heart failure: Secondary | ICD-10-CM

## 2019-12-31 NOTE — Progress Notes (Signed)
Daily Session Note  Patient Details  Name: Ryan Allison MRN: [id number] Date of Birth: July 30, 1940 Referring Provider:     Pulmonary Rehab from 11/27/2019 in [facility] Cardiac and Pulmonary Rehab  Referring Provider Gollan      Encounter Date: 12/31/2019  Check In:      Social History   Tobacco Use  Smoking Status Former Smoker  . Types: Cigarettes  . Quit date: 05/15/1969  . Years since quitting: 50.6  Smokeless Tobacco Never Used    Goals Met:  Independence with exercise equipment Exercise tolerated well No report of cardiac concerns or symptoms  Goals Unmet:  Not Applicable  Comments: Pt able to follow exercise prescription today without complaint.  Will continue to monitor for progression.    Dr. Emily Filbert is Medical Director for [place] and [name].

## 2020-01-02 ENCOUNTER — Encounter: Payer: Medicare Other | Admitting: *Deleted

## 2020-01-02 ENCOUNTER — Other Ambulatory Visit: Payer: Self-pay

## 2020-01-02 DIAGNOSIS — I5032 Chronic diastolic (congestive) heart failure: Secondary | ICD-10-CM

## 2020-01-02 NOTE — Progress Notes (Addendum)
Incomplete Session Note  Patient Details  Name: Ryan Allison MRN: [id number] Date of Birth: April 09, 1941 Referring Provider:     Pulmonary Rehab from 11/27/2019 in [facility] Cardiac and Pulmonary Rehab  Referring Provider [name] did not complete his rehab session.  BP low and symptomatic on arrival.  BP up some after several small bottles of water.  Continued to stay below his norm BP.  Sent home with friend to either stop by his PMD office or to call his PMD today.  Was stable to walk out today.   arrival  92/60  Asked to drink water To 80/58  Asked to drink more water  then 90/60 and final 92/60

## 2020-01-05 ENCOUNTER — Other Ambulatory Visit: Payer: Self-pay

## 2020-01-05 ENCOUNTER — Encounter: Payer: Medicare Other | Admitting: *Deleted

## 2020-01-05 DIAGNOSIS — I5032 Chronic diastolic (congestive) heart failure: Secondary | ICD-10-CM | POA: Diagnosis not present

## 2020-01-05 NOTE — Progress Notes (Signed)
Daily Session Note  Patient Details  Name: Ryan Allison MRN: [id number] Date of Birth: 1941-01-13 Referring Provider:     Pulmonary Rehab from 11/27/2019 in [facility] Cardiac and Pulmonary Rehab  Referring Provider Gollan      Encounter Date: 01/05/2020  Check In:  Session Check In - 01/05/20 1010      Check-In   Supervising physician immediately available to respond to emergencies See telemetry face sheet for immediately available ER MD    Location [facility]-Cardiac & Pulmonary Rehab    Staff Present Heath Lark, RN, BSN, CCRP;Jessica [place], MA, RCEP, CCRP, [place], BS, ACSM CEP, Exercise Physiologist    Virtual Visit No    Medication changes reported     No    Fall or balance concerns reported    No    Warm-up and Cool-down Performed on first and last piece of equipment    Resistance Training Performed Yes    VAD Patient? No    PAD/SET Patient? No      Pain Assessment   Currently in Pain? No/denies              Social History   Tobacco Use  Smoking Status Former Smoker  . Types: Cigarettes  . Quit date: 05/15/1969  . Years since quitting: 50.6  Smokeless Tobacco Never Used    Goals Met:  Proper associated with RPD/PD & O2 Sat Independence with exercise equipment Exercise tolerated well No report of cardiac concerns or symptoms  Goals Unmet:  Not Applicable  Comments: Pt able to follow exercise prescription today without complaint.  Will continue to monitor for progression.    Dr. Emily Filbert is Medical Director for [place] and [name].

## 2020-01-07 ENCOUNTER — Encounter: Payer: Medicare Other | Admitting: *Deleted

## 2020-01-07 ENCOUNTER — Other Ambulatory Visit: Payer: Self-pay

## 2020-01-07 DIAGNOSIS — I5032 Chronic diastolic (congestive) heart failure: Secondary | ICD-10-CM | POA: Diagnosis not present

## 2020-01-07 NOTE — Progress Notes (Signed)
Daily Session Note  Patient Details  Name: Ryan Allison MRN: [id number] Date of Birth: 02-05-1941 Referring Provider:     Pulmonary Rehab from 11/27/2019 in [facility] Cardiac and Pulmonary Rehab  Referring Provider Gollan      Encounter Date: 01/07/2020  Check In:  Session Check In - 01/07/20 0941      Check-In   Supervising physician immediately available to respond to emergencies See telemetry face sheet for immediately available ER MD    Location [facility]-Cardiac & Pulmonary Rehab    Staff Present Heath Lark, RN, BSN, CCRP;Daphane Odekirk [place], MA, RCEP, CCRP, [place], [place], ACSM CEP, Exercise Physiologist;Kara Eliezer Bottom, MS Exercise Physiologist    Virtual Visit No    Medication changes reported     No    Fall or balance concerns reported    No    Warm-up and Cool-down Performed on first and last piece of equipment    Resistance Training Performed Yes    VAD Patient? No    PAD/SET Patient? No      Pain Assessment   Currently in Pain? No/denies              Social History   Tobacco Use  Smoking Status Former Smoker   Types: Cigarettes   Quit date: 05/15/1969   Years since quitting: 50.6  Smokeless Tobacco Never Used    Goals Met:  Proper associated with RPD/PD & O2 Sat Independence with exercise equipment Using PLB without cueing & demonstrates good technique Exercise tolerated well No report of cardiac concerns or symptoms Strength training completed today  Goals Unmet:  Not Applicable  Comments: Pt able to follow exercise prescription today without complaint.  Will continue to monitor for progression.    Dr. Emily Filbert is Medical Director for [place] and [name].

## 2020-01-09 ENCOUNTER — Other Ambulatory Visit: Payer: Self-pay

## 2020-01-09 DIAGNOSIS — I5032 Chronic diastolic (congestive) heart failure: Secondary | ICD-10-CM

## 2020-01-09 NOTE — Progress Notes (Signed)
Daily Session Note  Patient Details  Name: Ryan Allison MRN: [id number] Date of Birth: 1940-10-04 Referring Provider:     Pulmonary Rehab from 11/27/2019 in [facility] Cardiac and Pulmonary Rehab  Referring Provider Gollan      Encounter Date: 01/09/2020  Check In:  Session Check In - 01/09/20 1008      Check-In   Supervising physician immediately available to respond to emergencies See telemetry face sheet for immediately available ER MD    Location [facility]-Cardiac & Pulmonary Rehab    Staff Present Carson Myrtle, BS, RRT, CPFT;Vida Rigger RN, BSN;Jessica Luan [place], MA, RCEP, CCRP, CCET    Virtual Visit No    Medication changes reported     No    Fall or balance concerns reported    No    Warm-up and Cool-down Performed on first and last piece of equipment    Resistance Training Performed Yes    VAD Patient? No    PAD/SET Patient? No      Pain Assessment   Currently in Pain? No/denies              Social History   Tobacco Use  Smoking Status Former Smoker  . Types: Cigarettes  . Quit date: 05/15/1969  . Years since quitting: 50.6  Smokeless Tobacco Never Used    Goals Met:  Proper associated with RPD/PD & O2 Sat Independence with exercise equipment Exercise tolerated well No report of cardiac concerns or symptoms Strength training completed today  Goals Unmet:  Not Applicable  Comments: Pt able to follow exercise prescription today without complaint.  Will continue to monitor for progression.   Dr. Emily Filbert is Medical Director for [place] and [name].

## 2020-01-12 ENCOUNTER — Other Ambulatory Visit: Payer: Self-pay

## 2020-01-12 ENCOUNTER — Encounter: Payer: Medicare Other | Admitting: *Deleted

## 2020-01-12 DIAGNOSIS — I5032 Chronic diastolic (congestive) heart failure: Secondary | ICD-10-CM

## 2020-01-12 NOTE — Progress Notes (Signed)
Daily Session Note  Patient Details  Name: Ryan Allison MRN: [id number] Date of Birth: 10-25-1940 Referring Provider:     Pulmonary Rehab from 11/27/2019 in [facility] Cardiac and Pulmonary Rehab  Referring Provider Gollan      Encounter Date: 01/12/2020  Check In:  Session Check In - 01/12/20 0953      Check-In   Supervising physician immediately available to respond to emergencies See telemetry face sheet for immediately available ER MD    Location [facility]-Cardiac & Pulmonary Rehab    Staff Present Heath Lark, RN, BSN, [place], BS, ACSM CEP, Exercise Physiologist;Joseph Tessie Fass RCP,RRT,BSRT    Virtual Visit No    Medication changes reported     No    Fall or balance concerns reported    No    Warm-up and Cool-down Performed on first and last piece of equipment    Resistance Training Performed Yes    VAD Patient? No    PAD/SET Patient? No      Pain Assessment   Currently in Pain? No/denies              Social History   Tobacco Use  Smoking Status Former Smoker  . Types: Cigarettes  . Quit date: 05/15/1969  . Years since quitting: 50.6  Smokeless Tobacco Never Used    Goals Met:  Proper associated with RPD/PD & O2 Sat Independence with exercise equipment Exercise tolerated well No report of cardiac concerns or symptoms  Goals Unmet:  Not Applicable  Comments: Pt able to follow exercise prescription today without complaint.  Will continue to monitor for progression.    Dr. Emily Filbert is Medical Director for [place] and [name].

## 2020-01-14 ENCOUNTER — Encounter: Payer: Medicare Other | Attending: Cardiovascular Disease | Admitting: *Deleted

## 2020-01-14 ENCOUNTER — Other Ambulatory Visit: Payer: Self-pay

## 2020-01-14 DIAGNOSIS — Z79899 Other long term (current) drug therapy: Secondary | ICD-10-CM | POA: Diagnosis not present

## 2020-01-14 DIAGNOSIS — G4733 Obstructive sleep apnea (adult) (pediatric): Secondary | ICD-10-CM | POA: Insufficient documentation

## 2020-01-14 DIAGNOSIS — I4892 Unspecified atrial flutter: Secondary | ICD-10-CM | POA: Insufficient documentation

## 2020-01-14 DIAGNOSIS — E785 Hyperlipidemia, unspecified: Secondary | ICD-10-CM | POA: Insufficient documentation

## 2020-01-14 DIAGNOSIS — I5032 Chronic diastolic (congestive) heart failure: Secondary | ICD-10-CM | POA: Insufficient documentation

## 2020-01-14 DIAGNOSIS — Z87891 Personal history of nicotine dependence: Secondary | ICD-10-CM | POA: Insufficient documentation

## 2020-01-14 DIAGNOSIS — G629 Polyneuropathy, unspecified: Secondary | ICD-10-CM | POA: Diagnosis not present

## 2020-01-14 DIAGNOSIS — Z7901 Long term (current) use of anticoagulants: Secondary | ICD-10-CM | POA: Insufficient documentation

## 2020-01-14 DIAGNOSIS — I700 Atherosclerosis of aorta: Secondary | ICD-10-CM | POA: Diagnosis not present

## 2020-01-14 NOTE — Progress Notes (Signed)
Daily Session Note  Patient Details  Name: Ryan Allison MRN: [id number] Date of Birth: [birth year] Referring Provider:     Pulmonary Rehab from 11/27/2019 in [facility] Cardiac and Pulmonary Rehab  Referring Provider Gollan      Encounter Date: 01/14/2020  Check In:  Session Check In - 01/14/20 1040      Check-In   Supervising physician immediately available to respond to emergencies See telemetry face sheet for immediately available ER MD    Location [facility]-Cardiac & Pulmonary Rehab    Staff Present Alberteen Sam, MA, RCEP, CCRP, CCET;Joseph Hood RCP,RRT,BSRT;Amanda [place], [place], ACSM CEP, Exercise Physiologist;Amya Hlad, RN, BSN, CCRP    Virtual Visit No    Medication changes reported     No    Fall or balance concerns reported    No    Warm-up and Cool-down Performed on first and last piece of equipment    Resistance Training Performed Yes    VAD Patient? No    PAD/SET Patient? No      Pain Assessment   Currently in Pain? No/denies              Social History   Tobacco Use  Smoking Status Former Smoker  . Types: Cigarettes  . Quit date: 05/15/1969  . Years since quitting: 50.7  Smokeless Tobacco Never Used    Goals Met:  Independence with exercise equipment Exercise tolerated well No report of cardiac concerns or symptoms  Goals Unmet:  Not Applicable  Comments: Pt able to follow exercise prescription today without complaint.  Will continue to monitor for progression.    Dr. Emily Filbert is Medical Director for [place] and [name].

## 2020-01-16 ENCOUNTER — Other Ambulatory Visit: Payer: Self-pay

## 2020-01-16 DIAGNOSIS — I5032 Chronic diastolic (congestive) heart failure: Secondary | ICD-10-CM

## 2020-01-16 NOTE — Progress Notes (Signed)
Daily Session Note  Patient Details  Name: Ryan Allison MRN: [id number] Date of Birth: January 21, 1941 Referring Provider:     Pulmonary Rehab from 11/27/2019 in [facility] Cardiac and Pulmonary Rehab  Referring Provider Gollan      Encounter Date: 01/16/2020  Check In:  Session Check In - 01/16/20 1017      Check-In   Supervising physician immediately available to respond to emergencies See telemetry face sheet for immediately available ER MD    Location [facility]-Cardiac & Pulmonary Rehab    Staff Present Renita Papa, RN BSN;Katye Valek RN, BSN;Jessica [place], MA, RCEP, CCRP, CCET;Melissa [place] RDN, LDN    Virtual Visit No    Medication changes reported     No    Fall or balance concerns reported    No    Warm-up and Cool-down Performed on first and last piece of equipment    Resistance Training Performed Yes    VAD Patient? No    PAD/SET Patient? No      Pain Assessment   Currently in Pain? No/denies              Social History   Tobacco Use  Smoking Status Former Smoker  . Types: Cigarettes  . Quit date: 05/15/1969  . Years since quitting: 50.7  Smokeless Tobacco Never Used    Goals Met:  Proper associated with RPD/PD & O2 Sat Independence with exercise equipment Exercise tolerated well No report of cardiac concerns or symptoms Strength training completed today  Goals Unmet:  Not Applicable  Comments: Pt able to follow exercise prescription today without complaint.  Will continue to monitor for progression.   Dr. Emily Filbert is Medical Director for [place] and [name].

## 2020-01-21 ENCOUNTER — Other Ambulatory Visit: Payer: Self-pay

## 2020-01-21 ENCOUNTER — Encounter: Payer: Self-pay | Admitting: *Deleted

## 2020-01-21 ENCOUNTER — Encounter: Payer: Medicare Other | Admitting: *Deleted

## 2020-01-21 DIAGNOSIS — I5032 Chronic diastolic (congestive) heart failure: Secondary | ICD-10-CM

## 2020-01-21 NOTE — Progress Notes (Signed)
Daily Session Note  Patient Details  Name: Ryan Allison MRN: [id number] Date of Birth: 1940-12-14 Referring Provider:     Pulmonary Rehab from 11/27/2019 in [facility]  Referring Provider Gollan      Encounter Date: 01/21/2020  Check In:  Session Check In - 01/21/20 1002      Check-In   Supervising physician immediately available to respond to emergencies See telemetry face sheet for immediately available ER MD    Location [facility]-Cardiac & Pulmonary Rehab    Staff Present Renita Papa, RN BSN;Joseph Hood RCP,RRT,BSRT;Melissa Brethren RDN, Rowe Pavy, [place], ACSM CEP, Exercise Physiologist    Virtual Visit No    Medication changes reported     No    Fall or balance concerns reported    No    Warm-up and Cool-down Performed on first and last piece of equipment    Resistance Training Performed Yes    VAD Patient? No    PAD/SET Patient? No      Pain Assessment   Currently in Pain? No/denies              Social History   Tobacco Use  Smoking Status Former Smoker  . Types: Cigarettes  . Quit date: 05/15/1969  . Years since quitting: 50.7  Smokeless Tobacco Never Used    Goals Met:  Independence with exercise equipment Exercise tolerated well No report of cardiac concerns or symptoms Strength training completed today  Goals Unmet:  Not Applicable  Comments: Pt able to follow exercise prescription today without complaint.  Will continue to monitor for progression.    Dr. Emily Filbert is Medical Director for [name].

## 2020-01-21 NOTE — Progress Notes (Signed)
Pulmonary Individual Treatment Plan  Patient Details  Name: Ryan Allison MRN: [id number] Date of Birth: Jun 28, 1940 Referring Provider:     Pulmonary Rehab from 11/27/2019 in [facility] Cardiac and Pulmonary Rehab  Referring Provider Gollan      Initial Encounter Date:    Pulmonary Rehab from 11/27/2019 in [facility] Cardiac and Pulmonary Rehab  Date 11/27/19      Visit Diagnosis: Heart failure, diastolic, chronic ([place])  Patient's Home Medications on Admission:  Current Outpatient Medications:  .  acetaminophen (TYLENOL) 500 MG tablet, Take 500 mg by mouth daily as needed for fever or pain. , Disp: , Rfl:  .  bicalutamide (CASODEX) 50 MG tablet, Take 50 mg by mouth daily. (Patient not taking: Reported on 11/24/2019), Disp: , Rfl:  .  cyanocobalamin 1000 MCG tablet, Take 1,000 mcg by mouth daily., Disp: , Rfl:  .  furosemide (LASIX) 20 MG tablet, Take 1 tablet (20 mg total) by mouth daily., Disp: 30 tablet, Rfl: 3 .  gabapentin (NEURONTIN) 600 MG tablet, Take 600 mg by mouth 5 (five) times daily. , Disp: , Rfl:  .  metoprolol tartrate (LOPRESSOR) 25 MG tablet, Take 0.5 tablet (12.5 mg) by mouth twice daily, Disp: 90 tablet, Rfl: 3 .  omeprazole (PRILOSEC) 40 MG capsule, Take 40 mg by mouth daily., Disp: , Rfl:  .  rivaroxaban (XARELTO) 20 MG TABS tablet, Take 1 tablet (20 mg total) by mouth daily with supper., Disp: 90 tablet, Rfl: 3 .  simvastatin (ZOCOR) 20 MG tablet, Take 20 mg by mouth at bedtime. , Disp: , Rfl:  .  tamsulosin (FLOMAX) 0.4 MG CAPS capsule, Take 0.4 mg by mouth daily. , Disp: , Rfl:  .  traMADol (ULTRAM) 50 MG tablet, Take 50 mg by mouth every 6 (six) hours as needed for moderate pain. , Disp: , Rfl:   Past Medical History: Past Medical History:  Diagnosis Date  . Aortic atherosclerosis ([place])   . Arthritis   . Atrial flutter ([place])    a. diagnosed 05/2018; b. not on [place] 2/2 recurrent life threatening GI bleed; c. CHADS2VASc => 3 (age x 2, vascular disease)  . Cancer [facility])  2011   prostate  . GI bleed   . History of echocardiogram    a. TTE 05/2018: EF of 60-65%, normal wall motion, mild MR, left atrium normal in size, RVSF normal, PASP 33 mmHg, rhythm was atrial flutter  . History of GI diverticular bleed    extended stay in the [facility] ICU due to GI bleeding prior to 2016  . HLD (hyperlipidemia)   . Hyperlipidemia   . Hypertension   . Meniscus tear   . OSA on CPAP   . Peripheral neuropathy     Tobacco Use: Social History   Tobacco Use  Smoking Status Former Smoker  . Types: Cigarettes  . Quit date: 05/15/1969  . Years since quitting: 50.7  Smokeless Tobacco Never Used    Labs: Recent Chemical engineer    Labs for ITP Cardiac and Pulmonary Rehab Latest Ref Rng & Units 06/06/2018   Hemoglobin A1c 4.8 - 5.6 % 5.5       Pulmonary Assessment Scores:  Pulmonary Assessment Scores    Row Name 11/27/19 1525         ADL UCSD   SOB Score total 89     Rest 2     Walk 4     Stairs 5     Bath 5     Dress  3     Shop 4       CAT Score   CAT Score 17       mMRC Score   mMRC Score 3            UCSD: Self-administered rating of dyspnea associated with activities of daily living (ADLs) 6-point scale (0 = "not at all" to 5 = "maximal or unable to do because of breathlessness")  Scoring Scores range from 0 to 120.  Minimally important difference is 5 units  CAT: CAT can identify the health impairment of COPD patients and is better correlated with disease progression.  CAT has a scoring range of zero to 40. The CAT score is classified into four groups of low (less than 10), medium (10 - 20), high (21-30) and very high (31-40) based on the impact level of disease on health status. A CAT score over 10 suggests significant symptoms.  A worsening CAT score could be explained by an exacerbation, poor medication adherence, poor inhaler technique, or progression of COPD or comorbid conditions.  CAT MCID is 2 points  mMRC: mMRC (Modified Medical  Research Council) Dyspnea Scale is used to assess the degree of baseline functional disability in patients of respiratory disease due to dyspnea. No minimal important difference is established. A decrease in score of 1 point or greater is considered a positive change.   Pulmonary Function Assessment:   Exercise Target Goals: Exercise Program Goal: Individual exercise prescription set using results from initial 6 min walk test and THRR while considering  patient's activity barriers and safety.   Exercise Prescription Goal: Initial exercise prescription builds to 30-45 minutes a day of aerobic activity, 2-3 days per week.  Home exercise guidelines will be given to patient during program as part of exercise prescription that the participant will acknowledge.  Education: Aerobic Exercise & Resistance Training: - Gives group verbal and written instruction on the various components of exercise. Focuses on aerobic and resistive training programs and the benefits of this training and how to safely progress through these programs..   Pulmonary Rehab from 01/21/2020 in [facility] Cardiac and Pulmonary Rehab  Date 12/31/19  Educator [facility]  Instruction Review Code 1- Verbalizes Understanding  [8/25 resistance ]      Education: Exercise & Equipment Safety: - Individual verbal instruction and demonstration of equipment use and safety with use of the equipment.   Pulmonary Rehab from 01/21/2020 in [facility] Cardiac and Pulmonary Rehab  Date 11/27/19  Educator AS  Instruction Review Code 1- Verbalizes Understanding      Education: Exercise Physiology & General Exercise Guidelines: - Group verbal and written instruction with models to review the exercise physiology of the cardiovascular system and associated critical values. Provides general exercise guidelines with specific guidelines to those with heart or lung disease.    Education: Flexibility, Balance, Mind/Body Relaxation: Provides group verbal/written  instruction on the benefits of flexibility and balance training, including mind/body exercise modes such as yoga, pilates and tai chi.  Demonstration and skill practice provided.   Activity Barriers & Risk Stratification:  Activity Barriers & Cardiac Risk Stratification - 11/24/19 1149      Activity Barriers & Cardiac Risk Stratification   Activity Barriers Shortness of Breath;Deconditioning           6 Minute Walk:  6 Minute Walk    Row Name 11/27/19 1518         6 Minute Walk   Distance 1090 feet     Walk Time  6 minutes     # of Rest Breaks 0     MPH 2.06     METS 2.4     RPE 13     Perceived Dyspnea  2     VO2 Peak 8.5     Symptoms No     Resting HR 81 bpm     Resting BP 102/58     Resting Oxygen Saturation  97 %     Exercise Oxygen Saturation  during 6 min walk 97 %     Max Ex. HR 99 bpm     Max Ex. BP 124/58     2 Minute Post BP 98/54       Interval HR   1 Minute HR 92     2 Minute HR 99     3 Minute HR 99     4 Minute HR 92     6 Minute HR 93     2 Minute Post HR 88     Interval Heart Rate? Yes       Interval Oxygen   Interval Oxygen? Yes     Baseline Oxygen Saturation % 97 %     1 Minute Oxygen Saturation % 97 %     1 Minute Liters of Oxygen 0 L     2 Minute Oxygen Saturation % 98 %     2 Minute Liters of Oxygen 0 L     3 Minute Oxygen Saturation % 97 %     3 Minute Liters of Oxygen 0 L     4 Minute Oxygen Saturation % 98 %     4 Minute Liters of Oxygen 0 L     5 Minute Oxygen Saturation % 98 %     5 Minute Liters of Oxygen 0 L     6 Minute Oxygen Saturation % 98 %     6 Minute Liters of Oxygen 0 L     2 Minute Post Oxygen Saturation % 98 %     2 Minute Post Liters of Oxygen 0 L           Oxygen Initial Assessment:  Oxygen Initial Assessment - 11/24/19 1134      Home Oxygen   Home Oxygen Device None    Sleep Oxygen Prescription None    Home Exercise Oxygen Prescription None    Home at Rest Exercise Oxygen Prescription None       Intervention   Short Term Goals To learn and demonstrate proper pursed lip breathing techniques or other breathing techniques.    Long  Term Goals Exhibits proper breathing techniques, such as pursed lip breathing or other method taught during program session           Oxygen Re-Evaluation:  Oxygen Re-Evaluation    [place] Name 12/29/19 1022             Program Oxygen Prescription   Program Oxygen Prescription None         Home Oxygen   Home Oxygen Device None       Sleep Oxygen Prescription None       Home Exercise Oxygen Prescription None       Home at Rest Exercise Oxygen Prescription None         Goals/Expected Outcomes   Short Term Goals To learn and demonstrate proper pursed lip breathing techniques or other breathing techniques.       Long  Term Goals Exhibits proper breathing techniques, such as  pursed lip breathing or other method taught during program session       Comments Reviewed PLB and using good posture to help reduce SOB.       Goals/Expected Outcomes Short: use PLB when needed Long: become independent with PLB              Oxygen Discharge (Final Oxygen Re-Evaluation):  Oxygen Re-Evaluation - 12/29/19 1022      Program Oxygen Prescription   Program Oxygen Prescription None      Home Oxygen   Home Oxygen Device None    Sleep Oxygen Prescription None    Home Exercise Oxygen Prescription None    Home at Rest Exercise Oxygen Prescription None      Goals/Expected Outcomes   Short Term Goals To learn and demonstrate proper pursed lip breathing techniques or other breathing techniques.    Long  Term Goals Exhibits proper breathing techniques, such as pursed lip breathing or other method taught during program session    Comments Reviewed PLB and using good posture to help reduce SOB.    Goals/Expected Outcomes Short: use PLB when needed Long: become independent with PLB           Initial Exercise Prescription:  Initial Exercise Prescription -  11/27/19 1500      Date of Initial Exercise RX and Referring Provider   Date 11/27/19    Referring Provider Gollan      Treadmill   MPH 1.7    Grade 0.5    Minutes 15    METs 2.4      NuStep   Level 2    SPM 80    Minutes 15    METs 2.4      REL-XR   Level 2    Speed 50    Minutes 15    METs 2.4      T5 Nustep   Level 1    SPM 80    Minutes 15    METs 2.4      Prescription Details   Frequency (times per week) 3    Duration Progress to 30 minutes of continuous aerobic without signs/symptoms of physical distress      Intensity   THRR 40-80% of Max Heartrate 106-131    Ratings of Perceived Exertion 11-15    Perceived Dyspnea 0-4      Resistance Training   Training Prescription Yes    Weight 3lb    Reps 10-15           Perform Capillary Blood Glucose checks as needed.  Exercise Prescription Changes:  Exercise Prescription Changes    Row Name 12/02/19 1300 12/17/19 1200 12/30/19 0700 01/12/20 1600       Response to Exercise   Blood Pressure (Admit) 120/70 122/62 110/62 112/66    Blood Pressure (Exercise) 130/58 128/64 150/70 128/68    Blood Pressure (Exit) 110/70 124/80 90/58 126/70    Heart Rate (Admit) 66 bpm 62 bpm 70 bpm 78 bpm    Heart Rate (Exercise) 96 bpm 101 bpm 92 bpm 91 bpm    Heart Rate (Exit) 92 bpm 94 bpm 73 bpm 83 bpm    Oxygen Saturation (Admit) 98 % 97 % 97 % 98 %    Oxygen Saturation (Exercise) 96 % 96 % 95 % 97 %    Oxygen Saturation (Exit) 98 % 97 % 98 % 98 %    Rating of Perceived Exertion (Exercise) 13 13 13 13     Perceived Dyspnea (  Exercise) 2 2 3 3     Symptoms -- -- -- SOB    Duration Progress to 30 minutes of  aerobic without signs/symptoms of physical distress Progress to 30 minutes of  aerobic without signs/symptoms of physical distress Continue with 30 min of aerobic exercise without signs/symptoms of physical distress. Continue with 30 min of aerobic exercise without signs/symptoms of physical distress.    Intensity THRR  unchanged THRR unchanged THRR unchanged THRR unchanged      Progression   Progression Continue to progress workloads to maintain intensity without signs/symptoms of physical distress. Continue to progress workloads to maintain intensity without signs/symptoms of physical distress. Continue to progress workloads to maintain intensity without signs/symptoms of physical distress. Continue to progress workloads to maintain intensity without signs/symptoms of physical distress.    Average METs -- 2.73 2.9 3.01      Resistance Training   Training Prescription Yes Yes Yes Yes    Weight 3 lb 3 lb 3 lb 3 lb    Reps 10-15 10-15 10-15 10-15      Interval Training   Interval Training No No No No      Treadmill   MPH 1.7 1.7 3 2.8    Grade 0.5 0.5 0.5 1    Minutes 15 15 15 15     METs 2.42 2.42 3.5 3.53      NuStep   Level -- 1 -- 2    Minutes -- 15 -- 15    METs -- 2.8 -- 2.4      REL-XR   Level 2 2 -- 5    Minutes 15 15 -- 15    METs -- -- -- 3.1      T5 Nustep   Level -- -- 1 --    SPM -- -- 80 --    Minutes -- -- 15 --    METs -- -- 2.3 --      Home Exercise Plan   Plans to continue exercise at -- -- -- Home (comment)  walking, PT exercises, considering [employer]    Frequency -- -- -- Add 2 additional days to program exercise sessions.    Initial Home Exercises Provided -- -- -- 12/29/19           Exercise Comments:   Exercise Goals and Review:  Exercise Goals    Row Name 11/27/19 1524             Exercise Goals   Increase Physical Activity Yes       Intervention Provide advice, education, support and counseling about physical activity/exercise needs.;Develop an individualized exercise prescription for aerobic and resistive training based on initial evaluation findings, risk stratification, comorbidities and participant's personal goals.       Expected Outcomes Short Term: Attend rehab on a regular basis to increase amount of physical activity.;Long Term: Add in home  exercise to make exercise part of routine and to increase amount of physical activity.;Long Term: Exercising regularly at least 3-5 days a week.       Increase Strength and Stamina Yes       Intervention Provide advice, education, support and counseling about physical activity/exercise needs.;Develop an individualized exercise prescription for aerobic and resistive training based on initial evaluation findings, risk stratification, comorbidities and participant's personal goals.       Expected Outcomes Short Term: Increase workloads from initial exercise prescription for resistance, speed, and METs.;Short Term: Perform resistance training exercises routinely during rehab and add in resistance training at home;Long  Term: Improve cardiorespiratory fitness, muscular endurance and strength as measured by increased METs and functional capacity (6MWT)       Able to understand and use rate of perceived exertion (RPE) scale Yes       Intervention Provide education and explanation on how to use RPE scale       Expected Outcomes Short Term: Able to use RPE daily in rehab to express subjective intensity level;Long Term:  Able to use RPE to guide intensity level when exercising independently       Able to understand and use Dyspnea scale Yes       Intervention Provide education and explanation on how to use Dyspnea scale       Expected Outcomes Short Term: Able to use Dyspnea scale daily in rehab to express subjective sense of shortness of breath during exertion;Long Term: Able to use Dyspnea scale to guide intensity level when exercising independently       Knowledge and understanding of Target Heart Rate Range (THRR) Yes       Intervention Provide education and explanation of THRR including how the numbers were predicted and where they are located for reference       Expected Outcomes Short Term: Able to state/look up THRR;Short Term: Able to use daily as guideline for intensity in rehab;Long Term: Able to use  THRR to govern intensity when exercising independently       Able to check pulse independently Yes       Intervention Provide education and demonstration on how to check pulse in carotid and radial arteries.;Review the importance of being able to check your own pulse for safety during independent exercise       Expected Outcomes Short Term: Able to explain why pulse checking is important during independent exercise;Long Term: Able to check pulse independently and accurately       Understanding of Exercise Prescription Yes       Intervention Provide education, explanation, and written materials on patient's individual exercise prescription       Expected Outcomes Short Term: Able to explain program exercise prescription;Long Term: Able to explain home exercise prescription to exercise independently              Exercise Goals Re-Evaluation :  Exercise Goals Re-Evaluation    Row Name 12/01/19 1104 12/17/19 1228 12/29/19 1038 01/12/20 1623       Exercise Goal Re-Evaluation   Exercise Goals Review Increase Physical Activity;Increase Strength and Stamina;Able to understand and use rate of perceived exertion (RPE) scale;Able to understand and use Dyspnea scale;Knowledge and understanding of Target Heart Rate Range (THRR);Understanding of Exercise Prescription Increase Physical Activity;Increase Strength and Stamina;Able to understand and use rate of perceived exertion (RPE) scale;Able to understand and use Dyspnea scale;Knowledge and understanding of Target Heart Rate Range (THRR);Understanding of Exercise Prescription Increase Physical Activity;Increase Strength and Stamina;Able to understand and use rate of perceived exertion (RPE) scale;Able to understand and use Dyspnea scale;Knowledge and understanding of Target Heart Rate Range (THRR);Able to check pulse independently;Understanding of Exercise Prescription Increase Physical Activity;Increase Strength and Stamina;Understanding of Exercise  Prescription    Comments Reviewed RPE and dyspnea scales, THR and program prescription with pt today.  Pt voiced understanding and was given a copy of goals to take home. Gene has been doing well in rehab. RPE ranges have been within appropriate ranges. Oxygen is maintained well during exericse. Will continue to progress workloads when ready. Continue to monitor. Reviewed home exercise with pt today.  Pt  plans to consider [employer] for exercise.  Reviewed THR, pulse, RPE, sign and symptoms, pulse oximetery and when to call 911 or MD.  Also discussed weather considerations and indoor options.  Pt voiced understanding. Gene has been doing well in rehab.  He is now on level 5 for the XR!  We will continue to monitor his progress.    Expected Outcomes Short: Use RPE daily to regulate intensity. Long: Follow program prescription in THR. Short: Continue attending rehab regularly Long: Progress MET level and increase strength/ stamina Short: add 1 day of exrecise outside program sessions, check out [employer] Long: continue to build stamina Short: Increase workload on NuStep Long: Continue to improve stamina.           Discharge Exercise Prescription (Final Exercise Prescription Changes):  Exercise Prescription Changes - 01/12/20 1600      Response to Exercise   Blood Pressure (Admit) 112/66    Blood Pressure (Exercise) 128/68    Blood Pressure (Exit) 126/70    Heart Rate (Admit) 78 bpm    Heart Rate (Exercise) 91 bpm    Heart Rate (Exit) 83 bpm    Oxygen Saturation (Admit) 98 %    Oxygen Saturation (Exercise) 97 %    Oxygen Saturation (Exit) 98 %    Rating of Perceived Exertion (Exercise) 13    Perceived Dyspnea (Exercise) 3    Symptoms SOB    Duration Continue with 30 min of aerobic exercise without signs/symptoms of physical distress.    Intensity THRR unchanged      Progression   Progression Continue to progress workloads to maintain intensity without signs/symptoms of physical distress.     Average METs 3.01      Resistance Training   Training Prescription Yes    Weight 3 lb    Reps 10-15      Interval Training   Interval Training No      Treadmill   MPH 2.8    Grade 1    Minutes 15    METs 3.53      NuStep   Level 2    Minutes 15    METs 2.4      REL-XR   Level 5    Minutes 15    METs 3.1      Home Exercise Plan   Plans to continue exercise at Home (comment)   walking, PT exercises, considering [employer]   Frequency Add 2 additional days to program exercise sessions.    Initial Home Exercises Provided 12/29/19           Nutrition:  Target Goals: Understanding of nutrition guidelines, daily intake of sodium <1546m, cholesterol <202m calories 30% from fat and 7% or less from saturated fats, daily to have 5 or more servings of fruits and vegetables.  Education: Controlling Sodium/Reading Food Labels -Group verbal and written material supporting the discussion of sodium use in heart healthy nutrition. Review and explanation with models, verbal and written materials for utilization of the food label.   Education: General Nutrition Guidelines/Fats and Fiber: -Group instruction provided by verbal, written material, models and posters to present the general guidelines for heart healthy nutrition. Gives an explanation and review of dietary fats and fiber.   [facility]  Date 01/21/20  [facility] 1- Verbalizes Understanding      Biometrics:  Pre Biometrics - 11/27/19 1525      Pre Biometrics   Height 5'  10" (1.778 m)    Weight 170 lb 12.8 oz (77.5 kg)    BMI (Calculated) 24.51    Single Leg Stand 19.13 seconds            Nutrition Therapy Plan and Nutrition Goals:  Nutrition Therapy & Goals - 12/22/19 1048      Nutrition Therapy   Diet High kcal, high protein - ileostomy MNT    Protein (specify units) 100-105g    Fiber --   Low fiber   Whole Grain Foods -1  servings    Saturated Fats 12 max. grams    Fruits and Vegetables 5 servings/day   peeled   Sodium 1.5 grams      Personal Nutrition Goals   Nutrition Goal ST: add boost BID, include more soluble fiber, move big meal to earlier in the day. LT: manage ostomy/slow transit time and decrease food stress, sleep through the night, gain weight/meet needs    Comments Pt primary diagnosis for Pulmonary Rehab is CHF. Hx of lower GI bleeding, recurrent prostate cancer, neuropathy, osteopenia, B12 deficiency, OSA on CPAP, diverticulosis, HLD. 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 Pt also reports having dizzy spells - likely due to both malnutrition and dehydration - encouraged pt to drink water during the day to ensure BP doesn't drop too low. Current Relevant Medications: ultram, zocor, prilosec, casodex. Discussed ileostomy MNT. Pt is nervous to eat and has lots of anxiety over eating. Sugested nutritional shakes BID - pt reports having boost, but has not started taking it yet. Pt reports getting up frequently in the middle of the night - discussed moving big meal to earlier in the day. Pt reports changing his bag often and believes he is not absorbing nutrients - discussed expectations for consistency and frequency and then discussed MNT to slow transit time such as increased soluble fiber. Will get more information on exact sugar recommendations and food blockage guidelines.      Intervention Plan   Intervention Prescribe, educate and counsel regarding individualized  specific dietary modifications aiming towards targeted core components such as weight, hypertension, lipid management, diabetes, heart failure and other comorbidities.;Nutrition handout(s) given to patient.    Expected Outcomes Short Term Goal: Understand basic principles of dietary content, such as calories, fat, sodium, cholesterol and nutrients.;Short Term Goal: A plan has been developed with personal nutrition goals set during dietitian appointment.;Long Term Goal: Adherence to prescribed nutrition plan.           Nutrition Assessments:   MEDIFICTS Score Key:          ?70 Need to make dietary changes          40-70 Heart Healthy Diet         ? 40 Therapeutic Level Cholesterol Diet  Nutrition Goals Re-Evaluation:  Nutrition Goals Re-Evaluation    [place] Name 12/29/19 1004             Goals   Nutrition Goal ST: add boost BID, include more soluble fiber, move big meal to  earlier in the day. LT: manage ostomy/slow transit time and decrease food stress, sleep through the night, gain weight/meet needs       Comment Pt reports eating between 12-2pm, now changing bag ends 8-9pm and he can now sleep during the night. Reviewed examples of soluble fiber and introducing new foods. Pt will try to incorporate boost and more water.  Pt reports not feeling as dizzy.       Expected Outcome ST: add boost BID, include more soluble fiber, move big meal to earlier in the day. LT: manage ostomy/slow transit time and decrease food stress, sleep through the night, gain weight/meet needs              Nutrition Goals Discharge (Final Nutrition Goals Re-Evaluation):  Nutrition Goals Re-Evaluation - 12/29/19 1004      Goals   Nutrition Goal ST: add boost BID, include more soluble fiber, move big meal to earlier in the day. LT: manage ostomy/slow transit time and decrease food stress, sleep through the night, gain weight/meet needs    Comment Pt reports eating between 12-2pm, now changing bag ends 8-9pm and  he can now sleep during the night. Reviewed examples of soluble fiber and introducing new foods. Pt will try to incorporate boost and more water.  Pt reports not feeling as dizzy.    Expected Outcome ST: add boost BID, include more soluble fiber, move big meal to earlier in the day. LT: manage ostomy/slow transit time and decrease food stress, sleep through the night, gain weight/meet needs           Psychosocial: Target Goals: Acknowledge presence or absence of significant depression and/or stress, maximize coping skills, provide positive support system. Participant is able to verbalize types and ability to use techniques and skills needed for reducing stress and depression.   Education: Depression - Provides group verbal and written instruction on the correlation between heart/lung disease and depressed mood, treatment options, and the stigmas associated with seeking treatment.   Pulmonary Rehab from 01/21/2020 in [facility] Cardiac and Pulmonary Rehab  Date 01/14/20  Educator as  Instruction Review Code 1- Science writer Understanding      Education: Sleep Hygiene -Provides group verbal and written instruction about how sleep can affect your health.  Define sleep hygiene, discuss sleep cycles and impact of sleep habits. Review good sleep hygiene tips.    Education: Stress and Anxiety: - Provides group verbal and written instruction about the health risks of elevated stress and causes of high stress.  Discuss the correlation between heart/lung disease and anxiety and treatment options. Review healthy ways to manage with stress and anxiety.   Initial Review & Psychosocial Screening:  Initial Psych Review & Screening - 11/24/19 1152      Initial Review   Current issues with Current Stress Concerns    Source of Stress Concerns Chronic Illness;Unable to perform yard/household activities;Unable to participate in former interests or hobbies    Comments Used to enjoy golf, has not been able to do  since the surgery.      Family Dynamics   Good Support System? Yes   Son, lady next door"watch out for each other" , neighbors as needed  [place] family     Barriers   Psychosocial barriers to participate in program There are no identifiable barriers or psychosocial needs.;The patient should benefit from training in stress management and relaxation.      Screening Interventions   Interventions Encouraged to exercise    Expected Outcomes Short  Term goal: Utilizing psychosocial counselor, staff and physician to assist with identification of specific Stressors or current issues interfering with healing process. Setting desired goal for each stressor or current issue identified.;Long Term Goal: Stressors or current issues are controlled or eliminated.;Short Term goal: Identification and review with participant of any Quality of Life or Depression concerns found by scoring the questionnaire.;Long Term goal: The participant improves quality of Life and PHQ9 Scores as seen by post scores and/or verbalization of changes           Quality of Life Scores:  Quality of Life - 11/27/19 1526      Quality of Life   Select Quality of Life      Quality of Life Scores   Health/Function Pre 14.65 %    Socioeconomic Pre 23 %    Psych/Spiritual Pre 12.64 %    Family Pre 24 %    GLOBAL Pre 17.07 %          Scores of 19 and below usually indicate a poorer quality of life in these areas.  A difference of  2-3 points is a clinically meaningful difference.  A difference of 2-3 points in the total score of the Quality of Life Index has been associated with significant improvement in overall quality of life, self-image, physical symptoms, and general health in studies assessing change in quality of life.  PHQ-9: Recent Review Flowsheet Data    Depression screen [facility] 2/9 12/29/2019 11/27/2019   Decreased Interest 1 3    Down, Depressed, Hopeless 1 3    PHQ - 2 Score 2 6   Altered sleeping 2 3    Tired,  decreased energy 1 3    Change in appetite 0 2   Feeling bad or failure about yourself  0 0   Trouble concentrating 1 3    Moving slowly or fidgety/restless 0 1   Suicidal thoughts 0 0   PHQ-9 Score 6 18   Difficult doing work/chores Not difficult at all Very difficult     Interpretation of Total Score  Total Score Depression Severity:  1-4 = Minimal depression, 5-9 = Mild depression, 10-14 = Moderate depression, 15-19 = Moderately severe depression, 20-27 = Severe depression   Psychosocial Evaluation and Intervention:  Psychosocial Evaluation - 11/24/19 1214      Psychosocial Evaluation & Interventions   Interventions Encouraged to exercise with the program and follow exercise prescription    Comments Gene has no barriers to starting the program.  He stated that he is excited to get started.  He has had several health issues in the past year. 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 He is looking forward to building his core strenght back and having more energy. HIs sleep cycle has been disrupted since his ileostomy surgery. He is catching naps during the day to help. He has a great support group of people from his sons that visit ofter, a neoghbor that "they keep an eye out on each other" , other neighbors and his church family. Gene is hoping this program will help him gain backthe ability to do some of his life activities. He was playing golf before his surgery. He is looking forward to meeting the RD. He should do well in the program.    Expected Outcomes STG: attend his scheduled sessions.     LTG: have more stamina and energy when graduates from the progam and be able to maintain what he built during the program.  Continue Psychosocial Services  Follow up required by staff           Psychosocial Re-Evaluation:  Psychosocial Re-Evaluation    [place] Name 12/29/19 1012             Psychosocial Re-Evaluation   Current issues with Current Stress Concerns        Comments Reviewed patient health questionnaire (PHQ-9) with patient for follow up. Previously, patients score indicated signs/symptoms of depression.  Reviewed to see if patient is improving symptom wise while in program.  Score improved and patient states that it is because he has been able to exercise in rehab and has a little more energy.       Expected Outcomes Short: Continue to attend [name] regularly for regular exercise and social engagement. Long: Continue to improve symptoms and manage a positive mental state.       Interventions Encouraged to attend Pulmonary Rehabilitation for the exercise       Continue Psychosocial Services  Follow up required by staff              Psychosocial Discharge (Final Psychosocial Re-Evaluation):  Psychosocial Re-Evaluation - 12/29/19 1012      Psychosocial Re-Evaluation   Current issues with Current Stress Concerns    Comments Reviewed patient health questionnaire (PHQ-9) with patient for follow up. Previously, patients score indicated signs/symptoms of depression.  Reviewed to see if patient is improving symptom wise while in program.  Score improved and patient states that it is because he has been able to exercise in rehab and has a little more energy.    Expected Outcomes Short: Continue to attend [name] regularly for regular exercise and social engagement. Long: Continue to improve symptoms and manage a positive mental state.    Interventions Encouraged to attend Pulmonary Rehabilitation for the exercise    Continue Psychosocial Services  Follow up required by staff           Education: Education Goals: Education classes will be provided on a weekly basis, covering required topics. Participant will state understanding/return demonstration of topics presented.  Learning Barriers/Preferences:  Learning Barriers/Preferences - 11/24/19 1159      Learning Barriers/Preferences   Learning Barriers None    Learning  Preferences None           General Pulmonary Education Topics:  Infection Prevention: - Provides verbal and written material to individual with discussion of infection control including proper hand washing and proper equipment cleaning during exercise session.   Pulmonary Rehab from 01/21/2020 in [facility] Cardiac and Pulmonary Rehab  Date 11/27/19  Educator As  Instruction Review Code 1- Verbalizes Understanding      Falls Prevention: - Provides verbal and written material to individual with discussion of falls prevention and safety.   Pulmonary Rehab from 01/21/2020 in [facility] Cardiac and Pulmonary Rehab  Date 11/27/19  Educator AS  Instruction Review Code 1- Verbalizes Understanding      Chronic Lung Diseases: - Group verbal and written instruction to review updates, respiratory medications, advancements in procedures and treatments. Discuss use of supplemental oxygen including available portable oxygen systems, continuous and intermittent flow rates, concentrators, personal use and safety guidelines. Review proper use of inhaler and spacers. Provide informative websites for self-education.    Pulmonary Rehab from 01/21/2020 in [facility] Cardiac and Pulmonary Rehab  Date 12/03/19  Educator [facility]  Instruction Review Code 1- Verbalizes Understanding      Energy Conservation: - Provide group verbal and written instruction for methods to conserve  energy, plan and organize activities. Instruct on pacing techniques, use of adaptive equipment and posture/positioning to relieve shortness of breath.   Triggers and Exacerbations: - Group verbal and written instruction to review types of environmental triggers and ways to prevent exacerbations. Discuss weather changes, air quality and the benefits of nasal washing. Review warning signs and symptoms to help prevent infections. Discuss techniques for effective airway clearance, coughing, and vibrations.   AED/CPR: - Group verbal and written instruction  with the use of models to demonstrate the basic use of the AED with the basic ABC's of resuscitation.   Anatomy and Physiology of the Lungs: - Group verbal and written instruction with the use of models to provide basic lung anatomy and physiology related to function, structure and complications of lung disease.   Anatomy & Physiology of the Heart: - Group verbal and written instruction and models provide basic cardiac anatomy and physiology, with the coronary electrical and arterial systems. Review of Valvular disease and Heart Failure   Cardiac Medications: - Group verbal and written instruction to review commonly prescribed medications for heart disease. Reviews the medication, class of the drug, and side effects.   Pulmonary Rehab from 01/21/2020 in [facility] Cardiac and Pulmonary Rehab  Date 12/10/19  Educator SB  Instruction Review Code 1- Verbalizes Understanding      Other: -Provides group and verbal instruction on various topics (see comments)   Knowledge Questionnaire Score:  Knowledge Questionnaire Score - 11/27/19 1527      Knowledge Questionnaire Score   Pre Score 21/26 card 12/18 pulm            Core Components/Risk Factors/Patient Goals at Admission:  Personal Goals and Risk Factors at Admission - 11/27/19 1530      Core Components/Risk Factors/Patient Goals on Admission    Weight Management Yes;Weight Maintenance    Intervention Weight Management: Develop a combined nutrition and exercise program designed to reach desired caloric intake, while maintaining appropriate intake of nutrient and fiber, sodium and fats, and appropriate energy expenditure required for the weight goal.;Weight Management: Provide education and appropriate resources to help participant work on and attain dietary goals.    Admit Weight 170 lb 12.8 oz (77.5 kg)    Goal Weight: Short Term 170 lb (77.1 kg)    Goal Weight: Long Term 175 lb (79.4 kg)    Expected Outcomes Short Term: Continue to  assess and modify interventions until short term weight is achieved;Long Term: Adherence to nutrition and physical activity/exercise program aimed toward attainment of established weight goal;Weight Maintenance: Understanding of the daily nutrition guidelines, which includes 25-35% calories from fat, 7% or less cal from saturated fats, less than 295m cholesterol, less than 1.5gm of sodium, & 5 or more servings of fruits and vegetables daily    Intervention Provide a combined exercise and nutrition program that is supplemented with education, support and counseling about heart failure. Directed toward relieving symptoms such as shortness of breath, decreased exercise tolerance, and extremity edema.    Expected Outcomes Improve functional capacity of life;Short term: Attendance in program 2-3 days a week with increased exercise capacity. Reported lower sodium intake. Reported increased fruit and vegetable intake. Reports medication compliance.;Short term: Daily weights obtained and reported for increase. Utilizing diuretic protocols set by physician.;Long term: Adoption of self-care skills and reduction of barriers for early signs and symptoms recognition and intervention leading to self-care maintenance.    Intervention Provide education and support for participant on nutrition & aerobic/resistive exercise along with prescribed  medications to achieve LDL <108m, HDL >487m    Expected Outcomes Short Term: Participant states understanding of desired cholesterol values and is compliant with medications prescribed. Participant is following exercise prescription and nutrition guidelines.;Long Term: Cholesterol controlled with medications as prescribed, with individualized exercise RX and with personalized nutrition plan. Value goals: LDL < 7048mHDL > 40 mg.           Education:Diabetes - Individual verbal and written instruction to review signs/symptoms of diabetes, desired ranges of glucose level fasting,  after meals and with exercise. Acknowledge that pre and post exercise glucose checks will be done for 3 sessions at entry of program.   Education: Know Your Numbers and Risk Factors: -Group verbal and written instruction about important numbers in your health.  Discussion of what are risk factors and how they play a role in the disease process.  Review of Cholesterol, Blood Pressure, Diabetes, and BMI and the role they play in your overall health.   [facility]  Date 12/24/19  [facility] Code 1- Verbalizes Understanding      Core Components/Risk Factors/Patient Goals Review:   Goals and Risk Factor Review    Row Name 12/29/19 1015             Core Components/Risk Factors/Patient Goals Review   Personal Goals Review Weight Management/Obesity;Heart Failure;Lipids       Review Gene feels the program has been helpful - he doesnt feel as fatigued since he has been exercising.  He does get SOB - right now exercising at a 3.  We reviewed PLB and trying to keep good posture to help breathing.  He is taking meds as directed.       Expected Outcomes Short: used PLB when needed Long: manage herat failure symptoms              Core Components/Risk Factors/Patient Goals at Discharge (Final Review):   Goals and Risk Factor Review - 12/29/19 1015      Core Components/Risk Factors/Patient Goals Review   Personal Goals Review Weight Management/Obesity;Heart Failure;Lipids    Review Gene feels the program has been helpful - he doesnt feel as fatigued since he has been exercising.  He does get SOB - right now exercising at a 3.  We reviewed PLB and trying to keep good posture to help breathing.  He is taking meds as directed.    Expected Outcomes Short: used PLB when needed Long: manage herat failure symptoms           ITP Comments:  ITP Comments    Row Name 11/24/19 1131 12/24/19 0750 01/21/20 1650       ITP  Comments Virtual Orientation completed today . Has EP Eval and gym orientationscheduled on 11/27/19. 30 Day review completed. Medical Director ITP review done, changes made as directed, and signed approval by Medical Director. 30 day review completed. ITP sent to Dr. MarEmily Filbertedical Director of Cardiac and Pulmonary Rehab. Continue with ITP unless changes are made by physician.            Comments: 30 day review

## 2020-01-23 ENCOUNTER — Encounter: Payer: Medicare Other | Admitting: *Deleted

## 2020-01-23 ENCOUNTER — Other Ambulatory Visit: Payer: Self-pay

## 2020-01-23 DIAGNOSIS — I5032 Chronic diastolic (congestive) heart failure: Secondary | ICD-10-CM

## 2020-01-23 NOTE — Progress Notes (Signed)
Daily Session Note  Patient Details  Name: Ryan Allison MRN: [id number] Date of Birth: 11-27-1940 Referring Provider:     Pulmonary Rehab from 11/27/2019 in [facility] Cardiac and Pulmonary Rehab  Referring Provider Gollan      Encounter Date: 01/23/2020  Check In:  Session Check In - 01/23/20 1013      Check-In   Supervising physician immediately available to respond to emergencies See telemetry face sheet for immediately available ER MD    Location [facility]-Cardiac & Pulmonary Rehab    Staff Present Renita Papa, RN BSN;Joseph [address] [place], [place], [place], CCRP, CCET    Virtual Visit No    Medication changes reported     No    Fall or balance concerns reported    No    Warm-up and Cool-down Performed on first and last piece of equipment    Resistance Training Performed Yes    VAD Patient? No    PAD/SET Patient? No      Pain Assessment   Currently in Pain? No/denies              Social History   Tobacco Use  Smoking Status Former Smoker  . Types: Cigarettes  . Quit date: 05/15/1969  . Years since quitting: 50.7  Smokeless Tobacco Never Used    Goals Met:  Independence with exercise equipment Exercise tolerated well No report of cardiac concerns or symptoms Strength training completed today  Goals Unmet:  Not Applicable  Comments: Pt able to follow exercise prescription today without complaint.  Will continue to monitor for progression.    Dr. Emily Filbert is Medical Director for [place] and [name].

## 2020-01-26 ENCOUNTER — Encounter: Payer: Medicare Other | Admitting: *Deleted

## 2020-01-26 ENCOUNTER — Other Ambulatory Visit: Payer: Self-pay

## 2020-01-26 DIAGNOSIS — I5032 Chronic diastolic (congestive) heart failure: Secondary | ICD-10-CM

## 2020-01-26 NOTE — Progress Notes (Signed)
Daily Session Note  Patient Details  Name: Muneeb W Scantlin MRN: [id number] Date of Birth: 07/26/1940 Referring Provider:     Pulmonary Rehab from 11/27/2019 in [facility] Cardiac and Pulmonary Rehab  Referring Provider Gollan      Encounter Date: 01/26/2020  Check In:  Session Check In - 01/26/20 1024      Check-In   Supervising physician immediately available to respond to emergencies See telemetry face sheet for immediately available ER MD    Location [facility]-Cardiac & Pulmonary Rehab    Staff Present Meredith Craven, RN BSN;Joseph Hood RCP,RRT,BSRT;Kelly Hayes, BS, ACSM CEP, Exercise Physiologist;Amanda Sommer, BA, ACSM CEP, Exercise Physiologist    Virtual Visit No    Medication changes reported     No    Fall or balance concerns reported    No    Warm-up and Cool-down Performed on first and last piece of equipment    Resistance Training Performed Yes    VAD Patient? No    PAD/SET Patient? No      Pain Assessment   Currently in Pain? No/denies              Social History   Tobacco Use  Smoking Status Former Smoker  . Types: Cigarettes  . Quit date: 05/15/1969  . Years since quitting: 50.7  Smokeless Tobacco Never Used    Goals Met:  Independence with exercise equipment Exercise tolerated well No report of cardiac concerns or symptoms Strength training completed today  Goals Unmet:  Not Applicable  Comments: Pt able to follow exercise prescription today without complaint.  Will continue to monitor for progression.    Dr. Mark Miller is Medical Director for [facility] and [name]. 

## 2020-01-30 ENCOUNTER — Encounter: Payer: Medicare Other | Admitting: *Deleted

## 2020-01-30 ENCOUNTER — Other Ambulatory Visit: Payer: Self-pay

## 2020-01-30 DIAGNOSIS — I5032 Chronic diastolic (congestive) heart failure: Secondary | ICD-10-CM

## 2020-01-30 NOTE — Progress Notes (Signed)
Daily Session Note  Patient Details  Name: Ryan Allison MRN: [id number] Date of Birth: 1940-10-26 Referring Provider:     Pulmonary Rehab from 11/27/2019 in [facility] Cardiac and Pulmonary Rehab  Referring Provider Gollan      Encounter Date: 01/30/2020  Check In:  Session Check In - 01/30/20 0934      Check-In   Supervising physician immediately available to respond to emergencies See telemetry face sheet for immediately available ER MD    Location [facility]-Cardiac & Pulmonary Rehab    Staff Present Heath Lark, RN, BSN, CCRP;Joseph Hood RCP,RRT,BSRT;Jessica [place], [place], [place], [place], CCET    Virtual Visit No    Medication changes reported     No    Fall or balance concerns reported    No    Warm-up and Cool-down Performed on first and last piece of equipment    Resistance Training Performed Yes    VAD Patient? No    PAD/SET Patient? No      Pain Assessment   Currently in Pain? No/denies              Social History   Tobacco Use  Smoking Status Former Smoker  . Types: Cigarettes  . Quit date: 05/15/1969  . Years since quitting: 50.7  Smokeless Tobacco Never Used    Goals Met:  Proper associated with RPD/PD & O2 Sat Independence with exercise equipment Exercise tolerated well Personal goals reviewed No report of cardiac concerns or symptoms  Goals Unmet:  Not Applicable  Comments: Pt able to follow exercise prescription today without complaint.  Will continue to monitor for progression.    Dr. Emily Filbert is Medical Director for [place] and [name].

## 2020-02-02 ENCOUNTER — Other Ambulatory Visit: Payer: Self-pay

## 2020-02-02 ENCOUNTER — Encounter: Payer: Medicare Other | Admitting: *Deleted

## 2020-02-02 DIAGNOSIS — I5032 Chronic diastolic (congestive) heart failure: Secondary | ICD-10-CM | POA: Diagnosis not present

## 2020-02-02 NOTE — Progress Notes (Signed)
Daily Session Note  Patient Details  Name: Ryan Allison MRN: [id number] Date of Birth: 18-Dec-1940 Referring Provider:     Pulmonary Rehab from 11/27/2019 in [facility] Cardiac and Pulmonary Rehab  Referring Provider Gollan      Encounter Date: 02/02/2020  Check In:  Session Check In - 02/02/20 0957      Check-In   Supervising physician immediately available to respond to emergencies See telemetry face sheet for immediately available ER MD    Location [facility]-Cardiac & Pulmonary Rehab    Staff Present Heath Lark, RN, BSN, Laveda Norman, BS, ACSM CEP, Exercise Physiologist;Joseph Tessie Fass RCP,RRT,BSRT    Virtual Visit No    Medication changes reported     No    Fall or balance concerns reported    No    Warm-up and Cool-down Performed on first and last piece of equipment    Resistance Training Performed Yes    VAD Patient? No    PAD/SET Patient? No      Pain Assessment   Currently in Pain? No/denies              Social History   Tobacco Use  Smoking Status Former Smoker  . Types: Cigarettes  . Quit date: 05/15/1969  . Years since quitting: 50.7  Smokeless Tobacco Never Used    Goals Met:  Proper associated with RPD/PD & O2 Sat Independence with exercise equipment Exercise tolerated well No report of cardiac concerns or symptoms  Goals Unmet:  Not Applicable  Comments: Pt able to follow exercise prescription today without complaint.  Will continue to monitor for progression.    Dr. Emily Filbert is Medical Director for [place] and [name].

## 2020-02-04 ENCOUNTER — Encounter: Payer: Medicare Other | Admitting: *Deleted

## 2020-02-04 DIAGNOSIS — I5032 Chronic diastolic (congestive) heart failure: Secondary | ICD-10-CM | POA: Diagnosis not present

## 2020-02-04 NOTE — Progress Notes (Signed)
02/05/2020 10:21 AM   Ryan Allison [birth year] [id number]  Referring provider: Adin Hector, MD [facility] [place],  [place] [address] Chief Complaint  Patient presents with  . Other    HPI: Ryan Allison is a 79 y.o. male who returns for a 10 month follow up of gross hematuria, nephrolithiasis and urinary obstruction.  -CT A/P in 01/2019 noted nonobstructing right renal calculi.  Prior CTs with contrast in 2018 and 2019x2 showed nonobstructing right lower pole calculi. 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  -Tamsulosin discontinued by PCP for hypertension -No worsening LUTS since stopping Flomax.  -No flank, abdominal or pelvic pain.   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 Treated with EBRT plus six months of ADT (complicated by low energy). --In 06/2010, PSA <0.1 --In 11/2012, PSA 1.9 --In 07/2013, PSA 1.9 --In 08/2016, PSA 11.9. Bone scan and CT scan neg for met --In 09/2016, PET Axumin showed uptake in prostate, L pelvic side wal and aortocaval node. --In 12/2016, [employer] mutation profiling was negative for mutations. --In 05/2018, CT and bone scan neg for mets. PSA 33.6 with doubling time of 12 months. Bicalutamide started.   PMH: Past Medical History:  Diagnosis Date  . Aortic atherosclerosis ([place])   . Arthritis   . Atrial flutter ([place])    a. diagnosed 05/2018; b. not on [place] 2/2 recurrent life threatening GI bleed; c. CHADS2VASc => 3 (age x 2, vascular disease)  . Cancer [facility]) 2011   prostate  . GI bleed   . History of echocardiogram    a. TTE 05/2018: EF of 60-65%, normal wall motion, mild MR, left atrium normal in size, RVSF normal, PASP 33 mmHg, rhythm was atrial  flutter  . History of GI diverticular bleed    extended stay in the [facility] ICU due to GI bleeding prior to 2016  . HLD (hyperlipidemia)   . Hyperlipidemia   . Hypertension   . Meniscus tear   . OSA on CPAP   . Peripheral neuropathy     Surgical History: Past Surgical History:  Procedure Laterality Date  . COLONOSCOPY Left 06/07/2018   Procedure: COLONOSCOPY;  Surgeon: Virgel Manifold, MD;  Location: [facility] ENDOSCOPY;  Service: Endoscopy;  Laterality: Left;  . COLONOSCOPY WITH PROPOFOL N/A 04/06/2015   Procedure: COLONOSCOPY WITH PROPOFOL;  Surgeon: Lollie Sails, MD;  Location: [facility] ENDOSCOPY;  Service: Endoscopy;  Laterality: N/A;  . COLONOSCOPY WITH PROPOFOL N/A 06/07/2018   Procedure: COLONOSCOPY WITH PROPOFOL;  Surgeon: Virgel Manifold, MD;  Location: [facility] ENDOSCOPY;  Service: Endoscopy;  Laterality: N/A;  . HERNIA REPAIR Left 09-23-14   inguinal  . INGUINAL HERNIA REPAIR Left 09/23/2014   Procedure: HERNIA REPAIR INGUINAL ADULT;  Surgeon: Christene Lye, MD;  Location: [facility] ORS;  Service: General;  Laterality: Left;  . MENISCUS REPAIR  2008  . VASCULAR SURGERY  2015    Home Medications:  Allergies as of 02/05/2020      Reactions   Nsaids Other (See Comments)   Diverticular bleed Diverticular bleed   Tolmetin    Other reaction(s): Other (See Comments) Diverticular bleed Diverticular bleed   Celebrex [celecoxib] Itching, Rash   Diltiazem Hcl Rash   Other Other (See Comments)   Diverticular bleed  Venlafaxine Other (See Comments)   sedation sedation      Medication List       Accurate as of February 05, 2020 10:21 AM. If you have any questions, ask your nurse or doctor.        STOP taking these medications   metoprolol tartrate 25 MG tablet Commonly known as: LOPRESSOR Stopped by: Abbie Sons, MD   tamsulosin 0.4 MG Caps capsule Commonly known as: FLOMAX Stopped by: Abbie Sons, MD     TAKE these medications   acetaminophen 500  MG tablet Commonly known as: TYLENOL Take 500 mg by mouth daily as needed for fever or pain.   bicalutamide 50 MG tablet Commonly known as: CASODEX Take 50 mg by mouth daily.   cyanocobalamin 1000 MCG tablet Take 1,000 mcg by mouth daily.   furosemide 20 MG tablet Commonly known as: LASIX Take 1 tablet (20 mg total) by mouth daily.   gabapentin 600 MG tablet Commonly known as: NEURONTIN Take 600 mg by mouth 5 (five) times daily.   omeprazole 40 MG capsule Commonly known as: PRILOSEC Take 40 mg by mouth daily.   rivaroxaban 20 MG Tabs tablet Commonly known as: XARELTO Take 1 tablet (20 mg total) by mouth daily with supper.   simvastatin 20 MG tablet Commonly known as: ZOCOR Take 20 mg by mouth at bedtime.   traMADol 50 MG tablet Commonly known as: ULTRAM Take 50 mg by mouth every 6 (six) hours as needed for moderate pain.       Allergies:  Allergies  Allergen Reactions  . Nsaids Other (See Comments)    Diverticular bleed Diverticular bleed  . Tolmetin     Other reaction(s): Other (See Comments) Diverticular bleed Diverticular bleed  . Celebrex [Celecoxib] Itching and Rash  . Diltiazem Hcl Rash  . Other Other (See Comments)    Diverticular bleed  . Venlafaxine Other (See Comments)    sedation sedation    Family History: Family History  Problem Relation Age of Onset  . Dementia Mother   . Cancer Father   . Cancer Brother     Social History:  reports that he quit smoking about 50 years ago. His smoking use included cigarettes. He has never used smokeless tobacco. He reports that he does not drink alcohol and does not use drugs.   Physical Exam: BP 135/73   Pulse 69   Ht _0  (1.803 m)   Wt 166 lb (75.3 kg)   BMI 23.15 kg/m   Constitutional:  Alert and oriented, No acute distress. HEENT: [place] AT, moist mucus membranes.  Trachea midline, no masses. Cardiovascular: No clubbing, cyanosis, or edema. Respiratory: Normal respiratory effort, no  increased work of breathing. Skin: No rashes, bruises or suspicious lesions. Neurologic: Grossly intact, no focal deficits, moving all 4 extremities. Psychiatric: Normal mood and affect.  Laboratory Data:  Pertinent Imaging: Results for orders placed or performed in visit on 02/05/20  BLADDER SCAN AMB NON-IMAGING  Result Value Ref Range   Scan Result 167m      Assessment & Plan:    1. Gross hematuria  -Intermittent episodes of gross hematuria. -He refused cystoscopy at last visit but is now agreeable to schedule  2.  Lower urinary tract symptoms -Bothersome voiding symptoms primarily obstructive -Cystoscopy as above  3. Nephrolithiasis -Nonobstructing right lower pole calculi which have been present since at least 2018. -KUB today.   3. Prostate cancer -PSA 0.26 as of 08/28/19 -Followed by medical oncology at  [facility] [address] [place], [place] [address] [phone]  I, Selena Batten, am acting as a scribe for Dr. Nicki Reaper C. Bobbye Reinitz,  I have reviewed the above documentation for accuracy and completeness, and I agree with the above.   Abbie Sons, MD

## 2020-02-04 NOTE — Progress Notes (Signed)
Daily Session Note  Patient Details  Name: Ryan Allison MRN: [id number] Date of Birth: Mar 20, 1941 Referring Provider:     Pulmonary Rehab from 11/27/2019 in [facility] Cardiac and Pulmonary Rehab  Referring Provider Gollan      Encounter Date: 02/04/2020  Check In:  Session Check In - 02/04/20 0931      Check-In   Supervising physician immediately available to respond to emergencies See telemetry face sheet for immediately available ER MD    Location [facility]-Cardiac & Pulmonary Rehab    Staff Present Darel Hong, RN BSN;Joseph Foy [place], [place], ACSM CEP, Exercise Physiologist;Melissa Caiola RDN, LDN    Virtual Visit No    Medication changes reported     No    Fall or balance concerns reported    No    Warm-up and Cool-down Performed on first and last piece of equipment    Resistance Training Performed Yes    VAD Patient? No    PAD/SET Patient? No      Pain Assessment   Currently in Pain? No/denies              Social History   Tobacco Use  Smoking Status Former Smoker  . Types: Cigarettes  . Quit date: 05/15/1969  . Years since quitting: 50.7  Smokeless Tobacco Never Used    Goals Met:  Proper associated with RPD/PD & O2 Sat Independence with exercise equipment Using PLB without cueing & demonstrates good technique Exercise tolerated well Strength training completed today  Goals Unmet:  Not Applicable  Comments: Pt able to follow exercise prescription today without complaint.  Will continue to monitor for progression.    Dr. Emily Filbert is Medical Director for [facility] and [name].

## 2020-02-05 ENCOUNTER — Other Ambulatory Visit: Payer: Self-pay

## 2020-02-05 ENCOUNTER — Ambulatory Visit (INDEPENDENT_AMBULATORY_CARE_PROVIDER_SITE_OTHER): Payer: Medicare Other | Admitting: Urology

## 2020-02-05 ENCOUNTER — Encounter: Payer: Self-pay | Admitting: Urology

## 2020-02-05 VITALS — BP 135/73 | HR 69 | Ht 71.0 in | Wt 166.0 lb

## 2020-02-05 DIAGNOSIS — N200 Calculus of kidney: Secondary | ICD-10-CM

## 2020-02-05 DIAGNOSIS — N139 Obstructive and reflux uropathy, unspecified: Secondary | ICD-10-CM

## 2020-02-05 DIAGNOSIS — R310 Gross hematuria: Secondary | ICD-10-CM

## 2020-02-05 DIAGNOSIS — C61 Malignant neoplasm of prostate: Secondary | ICD-10-CM | POA: Diagnosis not present

## 2020-02-05 DIAGNOSIS — R399 Unspecified symptoms and signs involving the genitourinary system: Secondary | ICD-10-CM | POA: Diagnosis not present

## 2020-02-05 LAB — BLADDER SCAN AMB NON-IMAGING

## 2020-02-06 ENCOUNTER — Encounter: Payer: Medicare Other | Admitting: *Deleted

## 2020-02-06 DIAGNOSIS — I5032 Chronic diastolic (congestive) heart failure: Secondary | ICD-10-CM

## 2020-02-06 NOTE — Progress Notes (Signed)
Daily Session Note  Patient Details  Name: Ryan Allison MRN: [id number] Date of Birth: 11-27-1940 Referring Provider:     Pulmonary Rehab from 11/27/2019 in [facility] Cardiac and Pulmonary Rehab  Referring Provider Gollan      Encounter Date: 02/06/2020  Check In:  Session Check In - 02/06/20 1103      Check-In   Supervising physician immediately available to respond to emergencies See telemetry face sheet for immediately available ER MD    Location [facility]-Cardiac & Pulmonary Rehab    Staff Present Hope Budds RDN, LDN;Jessica Luan [place], MA, RCEP, CCRP, CCET;Cord Wilczynski, RN, BSN, CCRP    Virtual Visit No    Medication changes reported     No    Fall or balance concerns reported    No    Warm-up and Cool-down Performed on first and last piece of equipment    Resistance Training Performed Yes    VAD Patient? No    PAD/SET Patient? No      Pain Assessment   Currently in Pain? No/denies              Social History   Tobacco Use  Smoking Status Former Smoker  . Types: Cigarettes  . Quit date: 05/15/1969  . Years since quitting: 50.7  Smokeless Tobacco Never Used    Goals Met:  Proper associated with RPD/PD & O2 Sat Independence with exercise equipment Exercise tolerated well No report of cardiac concerns or symptoms  Goals Unmet:  Not Applicable  Comments: Pt able to follow exercise prescription today without complaint.  Will continue to monitor for progression.    Dr. Emily Filbert is Medical Director for [facility] and [name].

## 2020-02-09 ENCOUNTER — Encounter: Payer: Medicare Other | Admitting: *Deleted

## 2020-02-09 ENCOUNTER — Other Ambulatory Visit: Payer: Self-pay

## 2020-02-09 DIAGNOSIS — I5032 Chronic diastolic (congestive) heart failure: Secondary | ICD-10-CM

## 2020-02-09 NOTE — Progress Notes (Signed)
Daily Session Note  Patient Details  Name: Ryan Allison MRN: [id number] Date of Birth: 10-22-1940 Referring Provider:     Pulmonary Rehab from 11/27/2019 in [facility] Cardiac and Pulmonary Rehab  Referring Provider Gollan      Encounter Date: 02/09/2020  Check In:  Session Check In - 02/09/20 0953      Check-In   Supervising physician immediately available to respond to emergencies See telemetry face sheet for immediately available ER MD    Location [facility]-Cardiac & Pulmonary Rehab    Staff Present Heath Lark, RN, BSN, CCRP;Joseph Hood RCP,RRT,BSRT;Kelly [place], [place], ACSM CEP, Exercise Physiologist    Virtual Visit No    Medication changes reported     No    Fall or balance concerns reported    No    Warm-up and Cool-down Performed on first and last piece of equipment    Resistance Training Performed Yes    VAD Patient? No    PAD/SET Patient? No      Pain Assessment   Currently in Pain? No/denies              Social History   Tobacco Use  Smoking Status Former Smoker  . Types: Cigarettes  . Quit date: 05/15/1969  . Years since quitting: 50.7  Smokeless Tobacco Never Used    Goals Met:  Proper associated with RPD/PD & O2 Sat Independence with exercise equipment Exercise tolerated well No report of cardiac concerns or symptoms  Goals Unmet:  Not Applicable  Comments: Pt able to follow exercise prescription today without complaint.  Will continue to monitor for progression.    Dr. Emily Filbert is Medical Director for [facility] and [name].

## 2020-02-11 ENCOUNTER — Encounter: Payer: Medicare Other | Admitting: *Deleted

## 2020-02-11 ENCOUNTER — Other Ambulatory Visit: Payer: Self-pay

## 2020-02-11 DIAGNOSIS — I5032 Chronic diastolic (congestive) heart failure: Secondary | ICD-10-CM

## 2020-02-11 NOTE — Progress Notes (Signed)
Daily Session Note  Patient Details  Name: Ryan Allison MRN: [id number] Date of Birth: 06/24/1940 Referring Provider:     Pulmonary Rehab from 11/27/2019 in [facility] Cardiac and Pulmonary Rehab  Referring Provider Gollan      Encounter Date: 02/11/2020  Check In:  Session Check In - 02/11/20 1159      Check-In   Supervising physician immediately available to respond to emergencies See telemetry face sheet for immediately available ER MD    Location [facility]-Cardiac & Pulmonary Rehab    Staff Present Susanne Bice, RN, BSN, CCRP;Amanda Sommer, BA, ACSM CEP, Exercise Physiologist;Kara Langdon, MS Exercise Physiologist    Virtual Visit No    Medication changes reported     No    Fall or balance concerns reported    No    Warm-up and Cool-down Performed on first and last piece of equipment    Resistance Training Performed Yes    VAD Patient? No    PAD/SET Patient? No      Pain Assessment   Currently in Pain? No/denies              Social History   Tobacco Use  Smoking Status Former Smoker  . Types: Cigarettes  . Quit date: 05/15/1969  . Years since quitting: 50.7  Smokeless Tobacco Never Used    Goals Met:  Proper associated with RPD/PD & O2 Sat Independence with exercise equipment Exercise tolerated well No report of cardiac concerns or symptoms  Goals Unmet:  Not Applicable  Comments: Pt able to follow exercise prescription today without complaint.  Will continue to monitor for progression.    Dr. Mark Miller is Medical Director for [facility] and [name]. 

## 2020-02-13 ENCOUNTER — Other Ambulatory Visit: Payer: Self-pay

## 2020-02-13 ENCOUNTER — Encounter: Payer: Medicare Other | Attending: Cardiovascular Disease | Admitting: *Deleted

## 2020-02-13 DIAGNOSIS — I5032 Chronic diastolic (congestive) heart failure: Secondary | ICD-10-CM | POA: Insufficient documentation

## 2020-02-13 NOTE — Progress Notes (Signed)
Daily Session Note  Patient Details  Name: Ryan Allison MRN: [id number] Date of Birth: 01/22/1941 Referring Provider:     Pulmonary Rehab from 11/27/2019 in [facility] Cardiac and Pulmonary Rehab  Referring Provider Gollan      Encounter Date: 02/13/2020  Check In:  Session Check In - 02/13/20 0931      Check-In   Supervising physician immediately available to respond to emergencies See telemetry face sheet for immediately available ER MD    Location [facility]-Cardiac & Pulmonary Rehab    Staff Present Renita Papa, RN BSN;Joseph Lou [place], [place] Exercise Physiologist    Virtual Visit No    Medication changes reported     No    Fall or balance concerns reported    No    Warm-up and Cool-down Performed on first and last piece of equipment    Resistance Training Performed Yes    VAD Patient? No    PAD/SET Patient? No      Pain Assessment   Currently in Pain? No/denies              Social History   Tobacco Use  Smoking Status Former Smoker  . Types: Cigarettes  . Quit date: 05/15/1969  . Years since quitting: 50.7  Smokeless Tobacco Never Used    Goals Met:  Independence with exercise equipment Exercise tolerated well No report of cardiac concerns or symptoms Strength training completed today  Goals Unmet:  Not Applicable  Comments: Pt able to follow exercise prescription today without complaint.  Will continue to monitor for progression.  [place] Name 11/27/19 1518 02/13/20 0955       6 Minute Walk   Phase -- Discharge    Distance 1090 feet 1113 feet    Distance % Change -- 2.11 %    Distance Feet Change -- 23 ft    Walk Time 6 minutes 6 minutes    # of Rest Breaks 0 0    MPH 2.06 2.1    METS 2.4 2.55    RPE 13 13    Perceived Dyspnea  2 3    VO2 Peak 8.5 8.95    Symptoms No No    Resting HR 81 bpm 67 bpm    Resting BP 102/58 108/62    Resting Oxygen Saturation  97 % 98 %    Exercise Oxygen Saturation  during 6 min walk  97 % 96 %    Max Ex. HR 99 bpm 100 bpm    Max Ex. BP 124/58 136/68    2 Minute Post BP 98/54 126/66      Interval HR   1 Minute HR 92 89    2 Minute HR 99 94    3 Minute HR 99 97    4 Minute HR 92 99    5 Minute HR -- 98    6 Minute HR 93 99    2 Minute Post HR 88 73    Interval Heart Rate? Yes Yes      Interval Oxygen   Interval Oxygen? Yes Yes    Baseline Oxygen Saturation % 97 % 98 %    1 Minute Oxygen Saturation % 97 % 98 %    1 Minute Liters of Oxygen 0 L 0 L    2 Minute Oxygen Saturation % 98 % 99 %    2 Minute Liters of Oxygen 0 L 0 L    3 Minute Oxygen Saturation %  97 % 98 %    3 Minute Liters of Oxygen 0 L 0 L    4 Minute Oxygen Saturation % 98 % 98 %    4 Minute Liters of Oxygen 0 L 0 L    5 Minute Oxygen Saturation % 98 % 98 %    5 Minute Liters of Oxygen 0 L 0 L    6 Minute Oxygen Saturation % 98 % 96 %    6 Minute Liters of Oxygen 0 L 0 L    2 Minute Post Oxygen Saturation % 98 % 98 %    2 Minute Post Liters of Oxygen 0 L 0 L             Dr. Emily Filbert is Medical Director for [facility] and [name].

## 2020-02-16 ENCOUNTER — Other Ambulatory Visit: Payer: Self-pay

## 2020-02-16 ENCOUNTER — Encounter: Payer: Medicare Other | Admitting: *Deleted

## 2020-02-16 DIAGNOSIS — I5032 Chronic diastolic (congestive) heart failure: Secondary | ICD-10-CM | POA: Diagnosis not present

## 2020-02-16 NOTE — Progress Notes (Signed)
Daily Session Note  Patient Details  Name: Ryan Allison MRN: [id number] Date of Birth: 1940/09/05 Referring Provider:     Pulmonary Rehab from 11/27/2019 in [facility] Cardiac and Pulmonary Rehab  Referring Provider Gollan      Encounter Date: 02/16/2020  Check In:  Session Check In - 02/16/20 0932      Check-In   Supervising physician immediately available to respond to emergencies See telemetry face sheet for immediately available ER MD    Location [facility]-Cardiac & Pulmonary Rehab    Staff Present Heath Lark, RN, BSN, [place] Norman, BS, ACSM CEP, Exercise Physiologist;Joseph Tessie Fass RCP,RRT,BSRT    Virtual Visit No    Medication changes reported     No    Fall or balance concerns reported    No    Warm-up and Cool-down Performed on first and last piece of equipment    Resistance Training Performed Yes    VAD Patient? No    PAD/SET Patient? No      Pain Assessment   Currently in Pain? No/denies              Social History   Tobacco Use  Smoking Status Former Smoker  . Types: Cigarettes  . Quit date: 05/15/1969  . Years since quitting: 50.7  Smokeless Tobacco Never Used    Goals Met:  Proper associated with RPD/PD & O2 Sat Independence with exercise equipment Exercise tolerated well No report of cardiac concerns or symptoms  Goals Unmet:  Not Applicable  Comments: Pt able to follow exercise prescription today without complaint.  Will continue to monitor for progression.    Dr. Emily Filbert is Medical Director for [place] and [name].

## 2020-02-18 ENCOUNTER — Encounter: Payer: Self-pay | Admitting: *Deleted

## 2020-02-18 ENCOUNTER — Other Ambulatory Visit: Payer: Self-pay

## 2020-02-18 ENCOUNTER — Encounter: Payer: Medicare Other | Admitting: *Deleted

## 2020-02-18 DIAGNOSIS — I5032 Chronic diastolic (congestive) heart failure: Secondary | ICD-10-CM | POA: Diagnosis not present

## 2020-02-18 NOTE — Progress Notes (Signed)
Daily Session Note  Patient Details  Name: DERRICO ZHONG MRN: [id number] Date of Birth: 1940/05/19 Referring Provider:     Pulmonary Rehab from 11/27/2019 in [facility] Cardiac and Pulmonary Rehab  Referring Provider Gollan      Encounter Date: 02/18/2020  Check In:  Session Check In - 02/18/20 0939      Check-In   Supervising physician immediately available to respond to emergencies See telemetry face sheet for immediately available ER MD    Location [facility]-Cardiac & Pulmonary Rehab    Staff Present Renita Papa, RN BSN;Joseph Hood RCP,RRT,BSRT;Melissa [place] RDN, LDN;Jessica [place], [place], RCEP, CCRP, CCET    Virtual Visit No    Medication changes reported     No    Fall or balance concerns reported    No    Warm-up and Cool-down Performed on first and last piece of equipment    Resistance Training Performed Yes    VAD Patient? No    PAD/SET Patient? No      Pain Assessment   Currently in Pain? No/denies              Social History   Tobacco Use  Smoking Status Former Smoker  . Types: Cigarettes  . Quit date: 05/15/1969  . Years since quitting: 50.7  Smokeless Tobacco Never Used    Goals Met:  Independence with exercise equipment Exercise tolerated well No report of cardiac concerns or symptoms Strength training completed today  Goals Unmet:  Not Applicable  Comments: Pt able to follow exercise prescription today without complaint.  Will continue to monitor for progression.    Dr. Emily Filbert is Medical Director for [facility] and [name].

## 2020-02-18 NOTE — Progress Notes (Signed)
Pulmonary Individual Treatment Plan  Patient Details  Name: Ryan Allison MRN: [id number] Date of Birth: Sep 16, 1940 Referring Provider:     Pulmonary Rehab from 11/27/2019 in [facility] Cardiac and Pulmonary Rehab  Referring Provider Gollan      Initial Encounter Date:    Pulmonary Rehab from 11/27/2019 in [facility] Cardiac and Pulmonary Rehab  Date 11/27/19      Visit Diagnosis: Heart failure, diastolic, chronic ([place])  Patient's Home Medications on Admission:  Current Outpatient Medications:  .  acetaminophen (TYLENOL) 500 MG tablet, Take 500 mg by mouth daily as needed for fever or pain. , Disp: , Rfl:  .  bicalutamide (CASODEX) 50 MG tablet, Take 50 mg by mouth daily. , Disp: , Rfl:  .  cyanocobalamin 1000 MCG tablet, Take 1,000 mcg by mouth daily., Disp: , Rfl:  .  furosemide (LASIX) 20 MG tablet, Take 1 tablet (20 mg total) by mouth daily., Disp: 30 tablet, Rfl: 3 .  gabapentin (NEURONTIN) 600 MG tablet, Take 600 mg by mouth 5 (five) times daily. , Disp: , Rfl:  .  omeprazole (PRILOSEC) 40 MG capsule, Take 40 mg by mouth daily., Disp: , Rfl:  .  rivaroxaban (XARELTO) 20 MG TABS tablet, Take 1 tablet (20 mg total) by mouth daily with supper., Disp: 90 tablet, Rfl: 3 .  simvastatin (ZOCOR) 20 MG tablet, Take 20 mg by mouth at bedtime. , Disp: , Rfl:  .  traMADol (ULTRAM) 50 MG tablet, Take 50 mg by mouth every 6 (six) hours as needed for moderate pain. , Disp: , Rfl:   Past Medical History: Past Medical History:  Diagnosis Date  . Aortic atherosclerosis ([place])   . Arthritis   . Atrial flutter ([place])    a. diagnosed 05/2018; b. not on [place] 2/2 recurrent life threatening GI bleed; c. CHADS2VASc => 3 (age x 2, vascular disease)  . Cancer [facility]) 2011   prostate  . GI bleed   . History of echocardiogram    a. TTE 05/2018: EF of 60-65%, normal wall motion, mild MR, left atrium normal in size, RVSF normal, PASP 33 mmHg, rhythm was atrial flutter  . History of GI diverticular bleed    extended  stay in the [facility] ICU due to GI bleeding prior to 2016  . HLD (hyperlipidemia)   . Hyperlipidemia   . Hypertension   . Meniscus tear   . OSA on CPAP   . Peripheral neuropathy     Tobacco Use: Social History   Tobacco Use  Smoking Status Former Smoker  . Types: Cigarettes  . Quit date: 05/15/1969  . Years since quitting: 50.7  Smokeless Tobacco Never Used    Labs: Recent Review [facility] for [facility] Cardiac and Pulmonary Rehab Latest Ref Rng & Units 06/06/2018   Hemoglobin A1c 4.8 - 5.6 % 5.5       Pulmonary Assessment Scores:   UCSD: Self-administered rating of dyspnea associated with activities of daily living (ADLs) 6-point scale (0 = "not at all" to 5 = "maximal or unable to do because of breathlessness")  Scoring Scores range from 0 to 120.  Minimally important difference is 5 units  CAT: CAT can identify the health impairment of COPD patients and is better correlated with disease progression.  CAT has a scoring range of zero to 40. The CAT score is classified into four groups of low (less than 10), medium (10 - 20), high (21-30) and very high (31-40) based on the impact  level of disease on health status. A CAT score over 10 suggests significant symptoms.  A worsening CAT score could be explained by an exacerbation, poor medication adherence, poor inhaler technique, or progression of COPD or comorbid conditions.  CAT MCID is 2 points  mMRC: mMRC (Modified Medical Research Council) Dyspnea Scale is used to assess the degree of baseline functional disability in patients of respiratory disease due to dyspnea. No minimal important difference is established. A decrease in score of 1 point or greater is considered a positive change.   Pulmonary Function Assessment:   Exercise Target Goals: Exercise Program Goal: Individual exercise prescription set using results from initial 6 min walk test and THRR while considering  patient's activity barriers and safety.    Exercise Prescription Goal: Initial exercise prescription builds to 30-45 minutes a day of aerobic activity, 2-3 days per week.  Home exercise guidelines will be given to patient during program as part of exercise prescription that the participant will acknowledge.  Education: Aerobic Exercise & Resistance Training: - Gives group verbal and written instruction on the various components of exercise. Focuses on aerobic and resistive training programs and the benefits of this training and how to safely progress through these programs..   Pulmonary Rehab from 02/11/2020 in [facility] Cardiac and Pulmonary Rehab  Instruction Review Code --  [8/25 resistance ]      Education: Exercise & Equipment Safety: - Individual verbal instruction and demonstration of equipment use and safety with use of the equipment.   Pulmonary Rehab from 02/04/2020 in [facility] Cardiac and Pulmonary Rehab  Date 11/27/19  Educator AS  Instruction Review Code 1- Verbalizes Understanding      Education: Exercise Physiology & General Exercise Guidelines: - Group verbal and written instruction with models to review the exercise physiology of the cardiovascular system and associated critical values. Provides general exercise guidelines with specific guidelines to those with heart or lung disease.    Education: Flexibility, Balance, Mind/Body Relaxation: Provides group verbal/written instruction on the benefits of flexibility and balance training, including mind/body exercise modes such as yoga, pilates and tai chi.  Demonstration and skill practice provided.   Activity Barriers & Risk Stratification:   6 Minute Walk:  6 Minute Walk    Row Name 02/13/20 0955         6 Minute Walk   Phase Discharge     Distance 1113 feet     Distance % Change 2.11 %     Distance Feet Change 23 ft     Walk Time 6 minutes     # of Rest Breaks 0     MPH 2.1     METS 2.55     RPE 13     Perceived Dyspnea  3     VO2 Peak 8.95      Symptoms No     Resting HR 67 bpm     Resting BP 108/62     Resting Oxygen Saturation  98 %     Exercise Oxygen Saturation  during 6 min walk 96 %     Max Ex. HR 100 bpm     Max Ex. BP 136/68     2 Minute Post BP 126/66       Interval HR   1 Minute HR 89     2 Minute HR 94     3 Minute HR 97     4 Minute HR 99     5 Minute HR 98  6 Minute HR 99     2 Minute Post HR 73     Interval Heart Rate? Yes       Interval Oxygen   Interval Oxygen? Yes     Baseline Oxygen Saturation % 98 %     1 Minute Oxygen Saturation % 98 %     1 Minute Liters of Oxygen 0 L     2 Minute Oxygen Saturation % 99 %     2 Minute Liters of Oxygen 0 L     3 Minute Oxygen Saturation % 98 %     3 Minute Liters of Oxygen 0 L     4 Minute Oxygen Saturation % 98 %     4 Minute Liters of Oxygen 0 L     5 Minute Oxygen Saturation % 98 %     5 Minute Liters of Oxygen 0 L     6 Minute Oxygen Saturation % 96 %     6 Minute Liters of Oxygen 0 L     2 Minute Post Oxygen Saturation % 98 %     2 Minute Post Liters of Oxygen 0 L           Oxygen Initial Assessment:   Oxygen Re-Evaluation:  Oxygen Re-Evaluation    Row Name 12/29/19 1022 01/30/20 0940           Program Oxygen Prescription   Program Oxygen Prescription None None        Home Oxygen   Home Oxygen Device None None      Sleep Oxygen Prescription None None      Home Exercise Oxygen Prescription None None      Home Resting Oxygen Prescription None None      Compliance with Home Oxygen Use -- Yes        Goals/Expected Outcomes   Short Term Goals To learn and demonstrate proper pursed lip breathing techniques or other breathing techniques. To learn and understand importance of monitoring SPO2 with pulse oximeter and demonstrate accurate use of the pulse oximeter.;To learn and understand importance of maintaining oxygen saturations>88%;To learn and demonstrate proper pursed lip breathing techniques or other breathing techniques.      Long   Term Goals Exhibits proper breathing techniques, such as pursed lip breathing or other method taught during program session Verbalizes importance of monitoring SPO2 with pulse oximeter and return demonstration;Maintenance of O2 saturations>88%;Exhibits proper breathing techniques, such as pursed lip breathing or other method taught during program session      Comments Reviewed PLB and using good posture to help reduce SOB. He does not has a pulse oximeter to check his oxygen saturation at home. Explained why it is important to have one. Reviewed that oxygen saturations should be 88 percent and above. Patient has a pulse oximeter at home to check his oxygen.      Goals/Expected Outcomes Short: use PLB when needed Long: become independent with PLB Short: monitor oxygen at home with exertion. Long: maintain oxygen saturations above 88 percent independently.             Oxygen Discharge (Final Oxygen Re-Evaluation):  Oxygen Re-Evaluation - 01/30/20 0940      Program Oxygen Prescription   Program Oxygen Prescription None      Home Oxygen   Home Oxygen Device None    Sleep Oxygen Prescription None    Home Exercise Oxygen Prescription None    Home Resting Oxygen Prescription None    Compliance with Home  Oxygen Use Yes      Goals/Expected Outcomes   Short Term Goals To learn and understand importance of monitoring SPO2 with pulse oximeter and demonstrate accurate use of the pulse oximeter.;To learn and understand importance of maintaining oxygen saturations>88%;To learn and demonstrate proper pursed lip breathing techniques or other breathing techniques.    Long  Term Goals Verbalizes importance of monitoring SPO2 with pulse oximeter and return demonstration;Maintenance of O2 saturations>88%;Exhibits proper breathing techniques, such as pursed lip breathing or other method taught during program session    Comments He does not has a pulse oximeter to check his oxygen saturation at home. Explained why  it is important to have one. Reviewed that oxygen saturations should be 88 percent and above. Patient has a pulse oximeter at home to check his oxygen.    Goals/Expected Outcomes Short: monitor oxygen at home with exertion. Long: maintain oxygen saturations above 88 percent independently.           Initial Exercise Prescription:   Perform Capillary Blood Glucose checks as needed.  Exercise Prescription Changes:  Exercise Prescription Changes    Row Name 12/30/19 0700 01/12/20 1600 01/26/20 1300 02/11/20 1200       Response to Exercise   Blood Pressure (Admit) 110/62 112/66 124/62 122/68    Blood Pressure (Exercise) 150/70 128/68 128/62 140/70    Blood Pressure (Exit) 90/58 126/70 120/60 116/56    Heart Rate (Admit) 70 bpm 78 bpm 54 bpm 57 bpm    Heart Rate (Exercise) 92 bpm 91 bpm 95 bpm 103 bpm    Heart Rate (Exit) 73 bpm 83 bpm 81 bpm 88 bpm    Oxygen Saturation (Admit) 97 % 98 % 94 % 97 %    Oxygen Saturation (Exercise) 95 % 97 % 97 % 95 %    Oxygen Saturation (Exit) 98 % 98 % 98 % 98 %    Rating of Perceived Exertion (Exercise) _0 Perceived Dyspnea (Exercise) _1 Symptoms -- SOB SOB SOB    Duration Continue with 30 min of aerobic exercise without signs/symptoms of physical distress. Continue with 30 min of aerobic exercise without signs/symptoms of physical distress. Continue with 30 min of aerobic exercise without signs/symptoms of physical distress. Continue with 30 min of aerobic exercise without signs/symptoms of physical distress.    Intensity THRR unchanged THRR unchanged THRR unchanged THRR unchanged      Progression   Progression Continue to progress workloads to maintain intensity without signs/symptoms of physical distress. Continue to progress workloads to maintain intensity without signs/symptoms of physical distress. Continue to progress workloads to maintain intensity without signs/symptoms of physical distress. Continue to progress workloads to  maintain intensity without signs/symptoms of physical distress.    Average METs 2.9 3.01 2.8 3.25      Resistance Training   Training Prescription Yes Yes Yes Yes    Weight 3 lb 3 lb 3 lb 5 lb    Reps 10-15 10-15 10-15 10-15      Interval Training   Interval Training No No No No      Treadmill   MPH 3 2.8 3 2.8    Grade 0.5 1 0.5 1    Minutes _2 METs 3.5 3.53 3.5 3.5      NuStep   Level -- 2 -- --    Minutes -- 15 -- --    METs -- 2.4 -- --  REL-XR   Level -- 5 -- 5    Minutes -- 15 -- 15    METs -- 3.1 -- 2.9      T5 Nustep   Level 1 -- 2 --    SPM 80 -- 80 --    Minutes 15 -- 15 --    METs 2.3 -- 2.6 --      Home Exercise Plan   Plans to continue exercise at -- Home (comment)  walking, PT exercises, considering [facility] Home (comment)  walking, PT exercises, considering [facility] Home (comment)  walking, PT exercises, considering [facility]    Frequency -- Add 2 additional days to program exercise sessions. Add 2 additional days to program exercise sessions. Add 2 additional days to program exercise sessions.    Initial Home Exercises Provided -- 12/29/19 12/29/19 12/29/19           Exercise Comments:   Exercise Goals and Review:   Exercise Goals Re-Evaluation :  Exercise Goals Re-Evaluation    Row Name 12/29/19 1038 01/12/20 1623 01/26/20 1313 01/30/20 0935 02/11/20 1257     Exercise Goal Re-Evaluation   Exercise Goals Review Increase Physical Activity;Increase Strength and Stamina;Able to understand and use rate of perceived exertion (RPE) scale;Able to understand and use Dyspnea scale;Knowledge and understanding of Target Heart Rate Range (THRR);Able to check pulse independently;Understanding of Exercise Prescription Increase Physical Activity;Increase Strength and Stamina;Understanding of Exercise Prescription Increase Physical Activity;Increase Strength and Stamina;Understanding of Exercise Prescription Increase Physical Activity;Increase  Strength and Stamina Increase Physical Activity;Increase Strength and Stamina   Comments Reviewed home exercise with pt today.  Pt plans to consider [facility] for exercise.  Reviewed THR, pulse, RPE, sign and symptoms, pulse oximetery and when to call 911 or MD.  Also discussed weather considerations and indoor options.  Pt voiced understanding. Ryan Allison has been doing well in rehab.  He is now on level 5 for the XR!  We will continue to monitor his progress. Ryan Allison attends consistently and works at [employer].  Oxygen levels remain above 95 % during exercise. Ryan Allison is walking at home and is using his dumbells at home on his off days from rehab. He is trying to stretch more at home. After [name] he is going to workout at home. Ryan Allison asked about shoulder pain today - he did it at home getting in bed.  He stated it hurts by poingting to  anterior deltoid area.  This EP reviewed ROM IR/ER and stretches for deltoid and pectorals.  He has been using ice and i recommended he continue.  Staff will follow up.   Expected Outcomes Short: add 1 day of exrecise outside program sessions, check out [facility] Long: continue to build stamina Short: Increase workload on NuStep Long: Continue to improve stamina. Short: continue to attend consistently Long: build overall stamina Short: workout at home on a daily bases. LongL Maintain a workout routine independently at home. Short: try ROM and stretches Long: reduce or eliminate shoulder pain          Discharge Exercise Prescription (Final Exercise Prescription Changes):  Exercise Prescription Changes - 02/11/20 1200      Response to Exercise   Blood Pressure (Admit) 122/68    Blood Pressure (Exercise) 140/70    Blood Pressure (Exit) 116/56    Heart Rate (Admit) 57 bpm    Heart Rate (Exercise) 103 bpm    Heart Rate (Exit) 88 bpm    Oxygen Saturation (Admit) 97 %    Oxygen Saturation (Exercise) 95 %  Oxygen Saturation (Exit) 98 %    Rating of Perceived Exertion (Exercise)  13    Perceived Dyspnea (Exercise) 3    Symptoms SOB    Duration Continue with 30 min of aerobic exercise without signs/symptoms of physical distress.    Intensity THRR unchanged      Progression   Progression Continue to progress workloads to maintain intensity without signs/symptoms of physical distress.    Average METs 3.25      Resistance Training   Training Prescription Yes    Weight 5 lb    Reps 10-15      Interval Training   Interval Training No      Treadmill   MPH 2.8    Grade 1    Minutes 15    METs 3.5      REL-XR   Level 5    Minutes 15    METs 2.9      Home Exercise Plan   Plans to continue exercise at Home (comment)   walking, PT exercises, considering [facility]   Frequency Add 2 additional days to program exercise sessions.    Initial Home Exercises Provided 12/29/19           Nutrition:  Target Goals: Understanding of nutrition guidelines, daily intake of sodium <1531m, cholesterol <2073m calories 30% from fat and 7% or less from saturated fats, daily to have 5 or more servings of fruits and vegetables.  Education: Controlling Sodium/Reading Food Labels -Group verbal and written material supporting the discussion of sodium use in heart healthy nutrition. Review and explanation with models, verbal and written materials for utilization of the food label.   Education: General Nutrition Guidelines/Fats and Fiber: -Group instruction provided by verbal, written material, models and posters to present the general guidelines for heart healthy nutrition. Gives an explanation and review of dietary fats and fiber.   [facility]  Date 01/21/20  [facility] The[facility]ruction Review Code 1- Verbalizes Understanding      Biometrics:    Nutrition Therapy Plan and Nutrition Goals:  Nutrition Therapy & Goals - 12/22/19 1048      Nutrition Therapy   Diet High kcal, high protein - ileostomy MNT    Protein  (specify units) 100-105g    Fiber --   Low fiber   Whole Grain Foods -1 servings    Saturated Fats 12 max. grams    Fruits and Vegetables 5 servings/day   peeled   Sodium 1.5 grams      Personal Nutrition Goals   Nutrition Goal ST: add boost BID, include more soluble fiber, move big meal to earlier in the day. LT: manage ostomy/slow transit time and decrease food stress, sleep through the night, gain weight/meet needs    Comments Pt primary diagnosis for Pulmonary Rehab is CHF. Hx of lower GI bleeding, recurrent prostate cancer, neuropathy, osteopenia, B12 deficiency, OSA on CPAP, diverticulosis, HLD. 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 Pt also reports having dizzy spells -  likely due to both malnutrition and dehydration - encouraged pt to drink water during the day to ensure BP doesn't drop too low. Current Relevant Medications: ultram, zocor, prilosec, casodex. Discussed ileostomy MNT. Pt is nervous to eat and has lots of anxiety over eating. Sugested nutritional shakes BID - pt reports having boost, but has not started taking it yet. Pt reports getting up frequently in the middle of the night - discussed moving big meal to earlier in the day. Pt reports changing his bag often and believes he is not absorbing nutrients - discussed expectations for consistency and frequency and then discussed MNT to slow transit time such as increased soluble fiber. Will get more information on exact sugar recommendations and food blockage guidelines.      Intervention Plan    Intervention Prescribe, educate and counsel regarding individualized specific dietary modifications aiming towards targeted core components such as weight, hypertension, lipid management, diabetes, heart failure and other comorbidities.;Nutrition handout(s) given to patient.    Expected Outcomes Short Term Goal: Understand basic principles of dietary content, such as calories, fat, sodium, cholesterol and nutrients.;Short Term Goal: A plan has been developed with personal nutrition goals set during dietitian appointment.;Long Term Goal: Adherence to prescribed nutrition plan.           Nutrition Assessments:   MEDIFICTS Score Key:          ?70 Need to make dietary changes          40-70 Heart Healthy Diet         ? 40 Therapeutic Level Cholesterol Diet  Nutrition Goals Re-Evaluation:  Nutrition Goals Re-Evaluation    [place] Name 12/29/19 1004 01/26/20 0954           Goals   Nutrition Goal ST: add boost BID, include more soluble fiber, move big meal to earlier in the day. LT: manage ostomy/slow transit time and decrease food stress, sleep through the night, gain weight/meet needs ST: add boost BID, include more soluble fiber, have at least 1 good protein source at each meal, slowly add new foods LT: manage ostomy/slow transit time and decrease food stress, sleep through the night, gain weight/meet needs      Comment Pt reports eating between 12-2pm, now changing bag ends 8-9pm and he can now sleep during the night. Reviewed examples of soluble fiber and introducing new foods. Pt will try to incorporate boost and more water.  Pt reports not feeling as dizzy. Pt asked many questions regarding transit time, specific foods, soluble vs insoluble fiber      Expected Outcome ST: add boost BID, include more soluble fiber, move big meal to earlier in the day. LT: manage ostomy/slow transit time and decrease food stress, sleep through the night, gain weight/meet needs ST: add boost BID, include more  soluble fiber, have at least 1 good protein source at each meal, slowly add new foods LT: manage ostomy/slow transit time and decrease food stress, sleep through the night, gain weight/meet needs             Nutrition Goals Discharge (Final Nutrition Goals Re-Evaluation):  Nutrition Goals Re-Evaluation - 01/26/20 0954      Goals   Nutrition Goal ST: add boost BID, include more soluble fiber, have at least 1 good protein source at each meal, slowly add new foods LT: manage ostomy/slow transit time and decrease food stress, sleep through the night, gain weight/meet needs    Comment Pt asked many questions regarding transit time, specific foods,  soluble vs insoluble fiber    Expected Outcome ST: add boost BID, include more soluble fiber, have at least 1 good protein source at each meal, slowly add new foods LT: manage ostomy/slow transit time and decrease food stress, sleep through the night, gain weight/meet needs           Psychosocial: Target Goals: Acknowledge presence or absence of significant depression and/or stress, maximize coping skills, provide positive support system. Participant is able to verbalize types and ability to use techniques and skills needed for reducing stress and depression.   Education: Depression - Provides group verbal and written instruction on the correlation between heart/lung disease and depressed mood, treatment options, and the stigmas associated with seeking treatment.   Pulmonary Rehab from 02/04/2020 in [facility] Cardiac and Pulmonary Rehab  Date 01/14/20  Educator as  Instruction Review Code 1- Science writer Understanding      Education: Sleep Hygiene -Provides group verbal and written instruction about how sleep can affect your health.  Define sleep hygiene, discuss sleep cycles and impact of sleep habits. Review good sleep hygiene tips.    Education: Stress and Anxiety: - Provides group verbal and written instruction about the health risks of elevated  stress and causes of high stress.  Discuss the correlation between heart/lung disease and anxiety and treatment options. Review healthy ways to manage with stress and anxiety.   Initial Review & Psychosocial Screening:   Quality of Life Scores:  Scores of 19 and below usually indicate a poorer quality of life in these areas.  A difference of  2-3 points is a clinically meaningful difference.  A difference of 2-3 points in the total score of the Quality of Life Index has been associated with significant improvement in overall quality of life, self-image, physical symptoms, and general health in studies assessing change in quality of life.  PHQ-9: Recent Review Flowsheet Data    Depression screen [facility] 2/9 01/30/2020 12/29/2019 11/27/2019   Decreased Interest 0 1 3    Down, Depressed, Hopeless 0 1 3    PHQ - 2 Score 0 2 6   Altered sleeping _0 Tired, decreased energy _1 Change in appetite 0 0 2   Feeling bad or failure about yourself  0 0 0   Trouble concentrating 0 1 3    Moving slowly or fidgety/restless 0 0 1   Suicidal thoughts 0 0 0   PHQ-9 Score _2 Difficult doing work/chores Not difficult at all Not difficult at all Very difficult     Interpretation of Total Score  Total Score Depression Severity:  1-4 = Minimal depression, 5-9 = Mild depression, 10-14 = Moderate depression, 15-19 = Moderately severe depression, 20-27 = Severe depression   Psychosocial Evaluation and Intervention:   Psychosocial Re-Evaluation:  Psychosocial Re-Evaluation    [place] Name 12/29/19 1012 01/30/20 0945           Psychosocial Re-Evaluation   Current issues with Current Stress Concerns Current Stress Concerns      Comments Reviewed patient health questionnaire (PHQ-9) with patient for follow up. Previously, patients score indicated signs/symptoms of depression.  Reviewed to see if patient is improving symptom wise while in program.  Score improved and patient states that it is  because he has been able to exercise in rehab and has a little more energy. Reviewed patient health questionnaire (PHQ-9) with patient for follow up. Previously, patients score indicated signs/symptoms of depression.  Reviewed to see if patient is improving symptom wise while in program.  Score improved and patient states that it is because his blod pressure has been better and not so low. He feels like his health has improved since he has been coming to rehab.      Expected Outcomes Short: Continue to attend [name] regularly for regular exercise and social engagement. Long: Continue to improve symptoms and manage a positive mental state. Short: Continue to attend [name] regularly for regular exercise and social engagement. Long: Continue to improve symptoms and manage a positive mental state.      Interventions Encouraged to attend Pulmonary Rehabilitation for the exercise Encouraged to attend Pulmonary Rehabilitation for the exercise      Continue Psychosocial Services  Follow up required by staff Follow up required by staff             Psychosocial Discharge (Final Psychosocial Re-Evaluation):  Psychosocial Re-Evaluation - 01/30/20 0945      Psychosocial Re-Evaluation   Current issues with Current Stress Concerns    Comments Reviewed patient health questionnaire (PHQ-9) with patient for follow up. Previously, patients score indicated signs/symptoms of depression.  Reviewed to see if patient is improving symptom wise while in program.  Score improved and patient states that it is because his blod pressure has been better and not so low. He feels like his health has improved since he has been coming to rehab.    Expected Outcomes Short: Continue to attend [name] regularly for regular exercise and social engagement. Long: Continue to improve symptoms and manage a positive mental state.    Interventions Encouraged to attend Pulmonary Rehabilitation for the  exercise    Continue Psychosocial Services  Follow up required by staff           Education: Education Goals: Education classes will be provided on a weekly basis, covering required topics. Participant will state understanding/return demonstration of topics presented.  Learning Barriers/Preferences:   General Pulmonary Education Topics:  Infection Prevention: - Provides verbal and written material to individual with discussion of infection control including proper hand washing and proper equipment cleaning during exercise session.   Pulmonary Rehab from 02/04/2020 in [facility] Cardiac and Pulmonary Rehab  Date 11/27/19  Educator As  Instruction Review Code 1- Verbalizes Understanding      Falls Prevention: - Provides verbal and written material to individual with discussion of falls prevention and safety.   Pulmonary Rehab from 02/04/2020 in [facility] Cardiac and Pulmonary Rehab  Date 11/27/19  Educator AS  Instruction Review Code 1- Verbalizes Understanding      Chronic Lung Diseases: - Group verbal and written instruction to review updates, respiratory medications, advancements in procedures and treatments. Discuss use of supplemental oxygen including available portable oxygen systems, continuous and intermittent flow rates, concentrators, personal use and safety guidelines. Review proper use of inhaler and spacers. Provide informative websites for self-education.    Pulmonary Rehab from 02/11/2020 in [facility] Cardiac and Pulmonary Rehab  Date 02/11/20  Educator [facility]  Instruction Review Code 1- Verbalizes Understanding      Energy Conservation: - Provide group verbal and written instruction for methods to conserve energy, plan and organize activities. Instruct on pacing techniques, use of adaptive equipment and posture/positioning to relieve shortness of breath.   Pulmonary Rehab from 02/11/2020 in [facility] Cardiac and Pulmonary Rehab  Date 02/11/20  Educator [facility]  Instruction Review Code 1-  Verbalizes Understanding      Triggers and Exacerbations: - Group verbal and written  instruction to review types of environmental triggers and ways to prevent exacerbations. Discuss weather changes, air quality and the benefits of nasal washing. Review warning signs and symptoms to help prevent infections. Discuss techniques for effective airway clearance, coughing, and vibrations.   Pulmonary Rehab from 02/11/2020 in [facility] Cardiac and Pulmonary Rehab  Date 02/11/20  Educator [facility]  Instruction Review Code 1- Verbalizes Understanding      AED/CPR: - Group verbal and written instruction with the use of models to demonstrate the basic use of the AED with the basic ABC's of resuscitation.   Anatomy and Physiology of the Lungs: - Group verbal and written instruction with the use of models to provide basic lung anatomy and physiology related to function, structure and complications of lung disease.   Pulmonary Rehab from 02/11/2020 in [facility] Cardiac and Pulmonary Rehab  Date 02/11/20  Educator [place]  Instruction Review Code 1- Verbalizes Understanding      Anatomy & Physiology of the Heart: - Group verbal and written instruction and models provide basic cardiac anatomy and physiology, with the coronary electrical and arterial systems. Review of Valvular disease and Heart Failure   Cardiac Medications: - Group verbal and written instruction to review commonly prescribed medications for heart disease. Reviews the medication, class of the drug, and side effects.   Pulmonary Rehab from 02/04/2020 in [facility] Cardiac and Pulmonary Rehab  Date 12/10/19  Educator SB  Instruction Review Code 1- Verbalizes Understanding      Other: -Provides group and verbal instruction on various topics (see comments)   Knowledge Questionnaire Score:    Core Components/Risk Factors/Patient Goals at Admission:   Education:Diabetes - Individual verbal and written instruction to review signs/symptoms of diabetes,  desired ranges of glucose level fasting, after meals and with exercise. Acknowledge that pre and post exercise glucose checks will be done for 3 sessions at entry of program.   Education: Know Your Numbers and Risk Factors: -Group verbal and written instruction about important numbers in your health.  Discussion of what are risk factors and how they play a role in the disease process.  Review of Cholesterol, Blood Pressure, Diabetes, and BMI and the role they play in your overall health.   Pulmonary Rehab from 02/04/2020 in [facility] Cardiac and Pulmonary Rehab  Date 02/04/20  Educator [facility]  Instruction Review Code 1- Verbalizes Understanding      Core Components/Risk Factors/Patient Goals Review:   Goals and Risk Factor Review    Row Name 12/29/19 1015 01/30/20 0943           Core Components/Risk Factors/Patient Goals Review   Personal Goals Review Weight Management/Obesity;Heart Failure;Lipids Weight Management/Obesity;Improve shortness of breath with ADL's      Review Ryan Allison feels the program has been helpful - he doesnt feel as fatigued since he has been exercising.  He does get SOB - right now exercising at a 3.  We reviewed PLB and trying to keep good posture to help breathing.  He is taking meds as directed. Spoke to patient about their shortness of breath and what they can do to improve. Patient has been informed of breathing techniques when starting the program. Patient is informed to tell staff if they have had any med changes and that certain meds they are taking or not taking can be causing shortness of breath.      Expected Outcomes Short: used PLB when needed Long: manage herat failure symptoms Short: Attend [name] regularly to improve shortness of breath with ADL's. Long: maintain  independence with ADL's             Core Components/Risk Factors/Patient Goals at Discharge (Final Review):   Goals and Risk Factor Review - 01/30/20 0943      Core Components/Risk Factors/Patient  Goals Review   Personal Goals Review Weight Management/Obesity;Improve shortness of breath with ADL's    Review Spoke to patient about their shortness of breath and what they can do to improve. Patient has been informed of breathing techniques when starting the program. Patient is informed to tell staff if they have had any med changes and that certain meds they are taking or not taking can be causing shortness of breath.    Expected Outcomes Short: Attend [name] regularly to improve shortness of breath with ADL's. Long: maintain independence with ADL's           ITP Comments:  ITP Comments    Row Name 12/24/19 0750 01/21/20 1650 02/18/20 0642       ITP Comments 30 Day review completed. Medical Director ITP review done, changes made as directed, and signed approval by Medical Director. 30 day review completed. ITP sent to Dr. Emily Filbert, Medical Director of Cardiac and Pulmonary Rehab. Continue with ITP unless changes are made by physician. 30 Day review completed. Medical Director ITP review done, changes made as directed, and signed approval by Medical Director.            Comments:

## 2020-02-20 ENCOUNTER — Other Ambulatory Visit: Payer: Self-pay

## 2020-02-20 DIAGNOSIS — I5032 Chronic diastolic (congestive) heart failure: Secondary | ICD-10-CM | POA: Diagnosis not present

## 2020-02-20 NOTE — Progress Notes (Signed)
Daily Session Note  Patient Details  Name: Ryan Allison MRN: [id number] Date of Birth: 01/29/1941 Referring Provider:     Pulmonary Rehab from 11/27/2019 in [facility] Cardiac and Pulmonary Rehab  Referring Provider Gollan      Encounter Date: 02/20/2020  Check In:  Session Check In - 02/20/20 0927      Check-In   Supervising physician immediately available to respond to emergencies See telemetry face sheet for immediately available ER MD    Location [facility]-Cardiac & Pulmonary Rehab    Staff Present Joseph Hood RCP,RRT,BSRT;Leslie Castrejon RN, BSN;Melissa Caiola RDN, LDN    Virtual Visit No    Medication changes reported     No    Fall or balance concerns reported    No    Warm-up and Cool-down Performed on first and last piece of equipment    Resistance Training Performed Yes    VAD Patient? No    PAD/SET Patient? No      Pain Assessment   Currently in Pain? No/denies              Social History   Tobacco Use  Smoking Status Former Smoker  . Types: Cigarettes  . Quit date: 05/15/1969  . Years since quitting: 50.8  Smokeless Tobacco Never Used    Goals Met:  Proper associated with RPD/PD & O2 Sat Independence with exercise equipment Exercise tolerated well No report of cardiac concerns or symptoms Strength training completed today  Goals Unmet:  Not Applicable  Comments: Pt able to follow exercise prescription today without complaint.  Will continue to monitor for progression.   Dr. Mark Miller is Medical Director for [facility] and [name]. 

## 2020-02-23 ENCOUNTER — Other Ambulatory Visit: Payer: Self-pay

## 2020-02-23 ENCOUNTER — Encounter: Payer: Medicare Other | Admitting: *Deleted

## 2020-02-23 DIAGNOSIS — I5032 Chronic diastolic (congestive) heart failure: Secondary | ICD-10-CM

## 2020-02-23 NOTE — Progress Notes (Signed)
Daily Session Note  Patient Details  Name: Ryan Allison MRN: [id number] Date of Birth: 04/15/1941 Referring Provider:     Pulmonary Rehab from 11/27/2019 in [place] and Pulmonary Rehab  Referring Provider Gollan      Encounter Date: 02/23/2020  Check In:      Social History   Tobacco Use  Smoking Status Former Smoker  . Types: Cigarettes  . Quit date: 05/15/1969  . Years since quitting: 50.8  Smokeless Tobacco Never Used    Goals Met:  Proper associated with RPD/PD & O2 Sat Independence with exercise equipment Exercise tolerated well No report of cardiac concerns or symptoms  Goals Unmet:  Not Applicable  Comments: Pt able to follow exercise prescription today without complaint.  Will continue to monitor for progression.    Dr. Emily Filbert is Medical Director for [place] and [name].

## 2020-02-23 NOTE — Patient Instructions (Signed)
Discharge Patient Instructions  Patient Details  Name: Ryan Allison MRN: [id number] Date of Birth: 1940/06/16 Referring Provider:  Minna Merritts, MD   Number of Visits: 95  Reason for Discharge:  Patient reached a stable level of exercise. Patient independent in their exercise. Patient has met program and personal goals.  Smoking History:  Social History   Tobacco Use  Smoking Status Former Smoker  . Types: Cigarettes  . Quit date: 05/15/1969  . Years since quitting: 50.8  Smokeless Tobacco Never Used    Diagnosis:  Heart failure, diastolic, chronic (HCC)  Initial Exercise Prescription:  Initial Exercise Prescription - 11/27/19 1500      Date of Initial Exercise RX and Referring Provider   Date 11/27/19    Referring Provider Gollan      Treadmill   MPH 1.7    Grade 0.5    Minutes 15    METs 2.4      NuStep   Level 2    SPM 80    Minutes 15    METs 2.4      REL-XR   Level 2    Speed 50    Minutes 15    METs 2.4      T5 Nustep   Level 1    SPM 80    Minutes 15    METs 2.4      Prescription Details   Frequency (times per week) 3    Duration Progress to 30 minutes of continuous aerobic without signs/symptoms of physical distress      Intensity   THRR 40-80% of Max Heartrate 106-131    Ratings of Perceived Exertion 11-15    Perceived Dyspnea 0-4      Resistance Training   Training Prescription Yes    Weight 3lb    Reps 10-15           Discharge Exercise Prescription (Final Exercise Prescription Changes):  Exercise Prescription Changes - 02/11/20 1200      Response to Exercise   Blood Pressure (Admit) 122/68    Blood Pressure (Exercise) 140/70    Blood Pressure (Exit) 116/56    Heart Rate (Admit) 57 bpm    Heart Rate (Exercise) 103 bpm    Heart Rate (Exit) 88 bpm    Oxygen Saturation (Admit) 97 %    Oxygen Saturation (Exercise) 95 %    Oxygen Saturation (Exit) 98 %    Rating of Perceived Exertion (Exercise) 13    Perceived  Dyspnea (Exercise) 3    Symptoms SOB    Duration Continue with 30 min of aerobic exercise without signs/symptoms of physical distress.    Intensity THRR unchanged      Progression   Progression Continue to progress workloads to maintain intensity without signs/symptoms of physical distress.    Average METs 3.25      Resistance Training   Training Prescription Yes    Weight 5 lb    Reps 10-15      Interval Training   Interval Training No      Treadmill   MPH 2.8    Grade 1    Minutes 15    METs 3.5      REL-XR   Level 5    Minutes 15    METs 2.9      Home Exercise Plan   Plans to continue exercise at Home (comment)   walking, PT exercises, considering [facility]   Frequency Add 2 additional days to program exercise sessions.  Initial Home Exercises Provided 12/29/19           Functional Capacity:  6 Minute Walk    Row Name 11/27/19 1518 02/13/20 0955       6 Minute Walk   Phase -- Discharge    Distance 1090 feet 1113 feet    Distance % Change -- 2.11 %    Distance Feet Change -- 23 ft    Walk Time 6 minutes 6 minutes    # of Rest Breaks 0 0    MPH 2.06 2.1    METS 2.4 2.55    RPE 13 13    Perceived Dyspnea  2 3    VO2 Peak 8.5 8.95    Symptoms No No    Resting HR 81 bpm 67 bpm    Resting BP 102/58 108/62    Resting Oxygen Saturation  97 % 98 %    Exercise Oxygen Saturation  during 6 min walk 97 % 96 %    Max Ex. HR 99 bpm 100 bpm    Max Ex. BP 124/58 136/68    2 Minute Post BP 98/54 126/66      Interval HR   1 Minute HR 92 89    2 Minute HR 99 94    3 Minute HR 99 97    4 Minute HR 92 99    5 Minute HR -- 98    6 Minute HR 93 99    2 Minute Post HR 88 73    Interval Heart Rate? Yes Yes      Interval Oxygen   Interval Oxygen? Yes Yes    Baseline Oxygen Saturation % 97 % 98 %    1 Minute Oxygen Saturation % 97 % 98 %    1 Minute Liters of Oxygen 0 L 0 L    2 Minute Oxygen Saturation % 98 % 99 %    2 Minute Liters of Oxygen 0 L 0 L    3  Minute Oxygen Saturation % 97 % 98 %    3 Minute Liters of Oxygen 0 L 0 L    4 Minute Oxygen Saturation % 98 % 98 %    4 Minute Liters of Oxygen 0 L 0 L    5 Minute Oxygen Saturation % 98 % 98 %    5 Minute Liters of Oxygen 0 L 0 L    6 Minute Oxygen Saturation % 98 % 96 %    6 Minute Liters of Oxygen 0 L 0 L    2 Minute Post Oxygen Saturation % 98 % 98 %    2 Minute Post Liters of Oxygen 0 L 0 L          Nutrition:  Nutrition Therapy & Goals - 12/22/19 1048      Nutrition Therapy   Diet High kcal, high protein - ileostomy MNT    Protein (specify units) 100-105g    Fiber --   Low fiber   Whole Grain Foods -1 servings    Saturated Fats 12 max. grams    Fruits and Vegetables 5 servings/day   peeled   Sodium 1.5 grams      Personal Nutrition Goals   Nutrition Goal ST: add boost BID, include more soluble fiber, move big meal to earlier in the day. LT: manage ostomy/slow transit time and decrease food stress, sleep through the night, gain weight/meet needs    Comments Pt primary diagnosis for Pulmonary Rehab is CHF.  Hx of lower GI bleeding, recurrent prostate cancer, neuropathy, osteopenia, B12 deficiency, OSA on CPAP, diverticulosis, HLD. 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 Pt also reports having dizzy spells - likely due to both malnutrition and dehydration - encouraged pt to drink water during the day to ensure BP doesn't drop too low. Current Relevant Medications: ultram, zocor, prilosec, casodex. Discussed ileostomy MNT. Pt is nervous to eat and  has lots of anxiety over eating. Sugested nutritional shakes BID - pt reports having boost, but has not started taking it yet. Pt reports getting up frequently in the middle of the night - discussed moving big meal to earlier in the day. Pt reports changing his bag often and believes he is not absorbing nutrients - discussed expectations for consistency and frequency and then discussed MNT to slow transit time such as increased soluble fiber. Will get more information on exact sugar recommendations and food blockage guidelines.      Intervention Plan   Intervention Prescribe, educate and counsel regarding individualized specific dietary modifications aiming towards targeted core components such as weight, hypertension, lipid management, diabetes, heart failure and other comorbidities.;Nutrition handout(s) given to patient.    Expected Outcomes Short Term Goal: Understand basic principles of dietary content, such as calories, fat, sodium, cholesterol and nutrients.;Short Term Goal: A plan has been developed with personal nutrition goals set during dietitian appointment.;Long Term Goal: Adherence to prescribed nutrition plan.          Goals reviewed with patient; copy given to patient.

## 2020-02-25 ENCOUNTER — Other Ambulatory Visit: Payer: Self-pay

## 2020-02-25 ENCOUNTER — Encounter: Payer: Medicare Other | Admitting: *Deleted

## 2020-02-25 DIAGNOSIS — I5032 Chronic diastolic (congestive) heart failure: Secondary | ICD-10-CM | POA: Diagnosis not present

## 2020-02-25 NOTE — Progress Notes (Signed)
Daily Session Note  Patient Details  Name: Ryan Allison MRN: [id number] Date of Birth: [birth year] Referring Provider:     Pulmonary Rehab from 11/27/2019 in [facility] Cardiac and Pulmonary Rehab  Referring Provider Gollan      Encounter Date: 02/25/2020  Check In:  Session Check In - 02/25/20 1010      Check-In   Supervising physician immediately available to respond to emergencies See telemetry face sheet for immediately available ER MD    Location [facility]-Cardiac & Pulmonary Rehab    Staff Present Heath Lark, RN, BSN, CCRP;Joseph Hood RCP,RRT,BSRT;Amanda [place], [place], ACSM CEP, Exercise Physiologist    Virtual Visit No    Medication changes reported     No    Fall or balance concerns reported    No    Warm-up and Cool-down Performed on first and last piece of equipment    Resistance Training Performed Yes    VAD Patient? No    PAD/SET Patient? No      Pain Assessment   Currently in Pain? No/denies              Social History   Tobacco Use  Smoking Status Former Smoker  . Types: Cigarettes  . Quit date: 05/15/1969  . Years since quitting: 50.8  Smokeless Tobacco Never Used    Goals Met:  Proper associated with RPD/PD & O2 Sat Independence with exercise equipment Exercise tolerated well No report of cardiac concerns or symptoms  Goals Unmet:  Not Applicable  Comments:  [name] graduated today from  rehab with 36 sessions completed.  Details of the patient's exercise prescription and what He needs to do in order to continue the prescription and progress were discussed with patient.  Patient was given a copy of prescription and goals.  Patient verbalized understanding.  [name] plans to continue to exercise by joining the [facility]  Dr. Emily Filbert is Medical Director for [place] and [name].

## 2020-02-25 NOTE — Progress Notes (Signed)
Discharge Progress Report  Patient Details  Name: Ryan Allison MRN: [id number] Date of Birth: Jun 12, 1940 Referring Provider:     Pulmonary Rehab from 11/27/2019 in [facility]  Referring Provider Gollan       Number of Visits: 32 Reason for Discharge:  Patient reached a stable level of exercise. Patient independent in their exercise.  Smoking History:  Social History   Tobacco Use  Smoking Status Former Smoker  . Types: Cigarettes  . Quit date: 05/15/1969  . Years since quitting: 50.8  Smokeless Tobacco Never Used    Diagnosis:  Heart failure, diastolic, chronic (HCC)  ADL [facility]:  Pulmonary Assessment Scores    Row Name 11/27/19 1525 02/23/20 0942 02/23/20 1015     ADL [facility]   SOB Score total 89 89 --   Rest 2 1 --   Walk 4 2 --   Stairs 5 4 --   Bath 5 3 --   Dress 3 2 --   Shop 4 3 --     CAT Score   CAT Score 17 12 --     mMRC Score   mMRC Score 3 -- 1          Initial Exercise Prescription:  Initial Exercise Prescription - 11/27/19 1500      Date of Initial Exercise RX and Referring Provider   Date 11/27/19    Referring Provider Gollan      Treadmill   MPH 1.7    Grade 0.5    Minutes 15    METs 2.4      NuStep   Level 2    SPM 80    Minutes 15    METs 2.4      REL-XR   Level 2    Speed 50    Minutes 15    METs 2.4      T5 Nustep   Level 1    SPM 80    Minutes 15    METs 2.4      Prescription Details   Frequency (times per week) 3    Duration Progress to 30 minutes of continuous aerobic without signs/symptoms of physical distress      Intensity   THRR 40-80% of Max Heartrate 106-131    Ratings of Perceived Exertion 11-15    Perceived Dyspnea 0-4      Resistance Training   Training Prescription Yes    Weight 3lb    Reps 10-15           Discharge Exercise Prescription (Final Exercise Prescription Changes):  Exercise Prescription Changes - 02/11/20 1200      Response to Exercise   Blood  Pressure (Admit) 122/68    Blood Pressure (Exercise) 140/70    Blood Pressure (Exit) 116/56    Heart Rate (Admit) 57 bpm    Heart Rate (Exercise) 103 bpm    Heart Rate (Exit) 88 bpm    Oxygen Saturation (Admit) 97 %    Oxygen Saturation (Exercise) 95 %    Oxygen Saturation (Exit) 98 %    Rating of Perceived Exertion (Exercise) 13    Perceived Dyspnea (Exercise) 3    Symptoms SOB    Duration Continue with 30 min of aerobic exercise without signs/symptoms of physical distress.    Intensity THRR unchanged      Progression   Progression Continue to progress workloads to maintain intensity without signs/symptoms of physical distress.    Average METs 3.25  Resistance Training   Training Prescription Yes    Weight 5 lb    Reps 10-15      Interval Training   Interval Training No      Treadmill   MPH 2.8    Grade 1    Minutes 15    METs 3.5      REL-XR   Level 5    Minutes 15    METs 2.9      Home Exercise Plan   Plans to continue exercise at Home (comment)   walking, PT exercises, considering [employer]   Frequency Add 2 additional days to program exercise sessions.    Initial Home Exercises Provided 12/29/19           Functional Capacity:  6 Minute Walk    Row Name 11/27/19 1518 02/13/20 0955       6 Minute Walk   Phase -- Discharge    Distance 1090 feet 1113 feet    Distance % Change -- 2.11 %    Distance Feet Change -- 23 ft    Walk Time 6 minutes 6 minutes    # of Rest Breaks 0 0    MPH 2.06 2.1    METS 2.4 2.55    RPE 13 13    Perceived Dyspnea  2 3    VO2 Peak 8.5 8.95    Symptoms No No    Resting HR 81 bpm 67 bpm    Resting BP 102/58 108/62    Resting Oxygen Saturation  97 % 98 %    Exercise Oxygen Saturation  during 6 min walk 97 % 96 %    Max Ex. HR 99 bpm 100 bpm    Max Ex. BP 124/58 136/68    2 Minute Post BP 98/54 126/66      Interval HR   1 Minute HR 92 89    2 Minute HR 99 94    3 Minute HR 99 97    4 Minute HR 92 99    5 Minute HR  -- 98    6 Minute HR 93 99    2 Minute Post HR 88 73    Interval Heart Rate? Yes Yes      Interval Oxygen   Interval Oxygen? Yes Yes    Baseline Oxygen Saturation % 97 % 98 %    1 Minute Oxygen Saturation % 97 % 98 %    1 Minute Liters of Oxygen 0 L 0 L    2 Minute Oxygen Saturation % 98 % 99 %    2 Minute Liters of Oxygen 0 L 0 L    3 Minute Oxygen Saturation % 97 % 98 %    3 Minute Liters of Oxygen 0 L 0 L    4 Minute Oxygen Saturation % 98 % 98 %    4 Minute Liters of Oxygen 0 L 0 L    5 Minute Oxygen Saturation % 98 % 98 %    5 Minute Liters of Oxygen 0 L 0 L    6 Minute Oxygen Saturation % 98 % 96 %    6 Minute Liters of Oxygen 0 L 0 L    2 Minute Post Oxygen Saturation % 98 % 98 %    2 Minute Post Liters of Oxygen 0 L 0 L           Psychological, QOL, Others - Outcomes: PHQ 2/9: Depression screen [facility] 2/9 02/23/2020 01/30/2020 12/29/2019 11/27/2019  Decreased Interest 1 0 1 3  Down, Depressed, Hopeless 1 0 1 3  PHQ - 2 Score 2 0 2 6  Altered sleeping 1 1 2 3   Tired, decreased energy 2 2 1 3   Change in appetite 1 0 0 2  Feeling bad or failure about yourself  1 0 0 0  Trouble concentrating 1 0 1 3  Moving slowly or fidgety/restless 0 0 0 1  Suicidal thoughts 0 0 0 0  PHQ-9 Score 8 3 6 18   Difficult doing work/chores Not difficult at all Not difficult at all Not difficult at all Very difficult    Quality of Life:  Quality of Life - 11/27/19 1526      Quality of Life   Select Quality of Life      Quality of Life Scores   Health/Function Pre 14.65 %    Socioeconomic Pre 23 %    Psych/Spiritual Pre 12.64 %    Family Pre 24 %    GLOBAL Pre 17.07 %          Nutrition & Weight - Outcomes:  Pre Biometrics - 11/27/19 1525      Pre Biometrics   Height 5\' 10"  (1.778 m)    Weight 170 lb 12.8 oz (77.5 kg)    BMI (Calculated) 24.51    Single Leg Stand 19.13 seconds            Nutrition:  Nutrition Therapy & Goals - 12/22/19 1048      Nutrition Therapy    Diet High kcal, high protein - ileostomy MNT    Protein (specify units) 100-105g    Fiber --   Low fiber   Whole Grain Foods -1 servings    Saturated Fats 12 max. grams    Fruits and Vegetables 5 servings/day   peeled   Sodium 1.5 grams      Personal Nutrition Goals   Nutrition Goal ST: add boost BID, include more soluble fiber, move big meal to earlier in the day. LT: manage ostomy/slow transit time and decrease food stress, sleep through the night, gain weight/meet needs    Comments Pt primary diagnosis for Pulmonary Rehab is CHF. Hx of lower GI bleeding, recurrent prostate cancer, neuropathy, osteopenia, B12 deficiency, OSA on CPAP, diverticulosis, HLD. 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 Pt also reports having dizzy spells - likely due to both malnutrition and dehydration - encouraged pt to drink water during the day to ensure BP doesn't drop too low. Current Relevant Medications: ultram, zocor, prilosec, casodex. Discussed ileostomy MNT. Pt is nervous to eat and has lots of anxiety over eating. Sugested nutritional shakes BID - pt reports having boost, but has not started taking it yet. Pt reports getting up frequently in the middle of the night - discussed moving big meal to earlier in the day. Pt reports changing his bag often and believes he is not absorbing nutrients - discussed expectations for consistency and frequency and then discussed MNT to slow transit time such as increased  soluble fiber. Will get more information on exact sugar recommendations  and food blockage guidelines.      Intervention Plan   Intervention Prescribe, educate and counsel regarding individualized specific dietary modifications aiming towards targeted core components such as weight, hypertension, lipid management, diabetes, heart failure and other comorbidities.;Nutrition handout(s) given to patient.    Expected Outcomes Short Term Goal: Understand basic principles of dietary content, such as calories, fat, sodium, cholesterol and nutrients.;Short Term Goal: A plan has been developed with personal nutrition goals set during dietitian appointment.;Long Term Goal: Adherence to prescribed nutrition plan.           Goals reviewed with patient; copy given to patient.

## 2020-02-25 NOTE — Progress Notes (Signed)
Pulmonary Individual Treatment Plan  Patient Details  Name: Ryan Allison MRN: [id number] Date of Birth: 1941-01-29 Referring Provider:     Pulmonary Rehab from 11/27/2019 in [facility] Cardiac and Pulmonary Rehab  Referring Provider Gollan      Initial Encounter Date:    Pulmonary Rehab from 11/27/2019 in [facility] Cardiac and Pulmonary Rehab  Date 11/27/19      Visit Diagnosis: Heart failure, diastolic, chronic ([place])  Patient's Home Medications on Admission:  Current Outpatient Medications:  .  acetaminophen (TYLENOL) 500 MG tablet, Take 500 mg by mouth daily as needed for fever or pain. , Disp: , Rfl:  .  bicalutamide (CASODEX) 50 MG tablet, Take 50 mg by mouth daily. , Disp: , Rfl:  .  cyanocobalamin 1000 MCG tablet, Take 1,000 mcg by mouth daily., Disp: , Rfl:  .  furosemide (LASIX) 20 MG tablet, Take 1 tablet (20 mg total) by mouth daily., Disp: 30 tablet, Rfl: 3 .  gabapentin (NEURONTIN) 600 MG tablet, Take 600 mg by mouth 5 (five) times daily. , Disp: , Rfl:  .  omeprazole (PRILOSEC) 40 MG capsule, Take 40 mg by mouth daily., Disp: , Rfl:  .  rivaroxaban (XARELTO) 20 MG TABS tablet, Take 1 tablet (20 mg total) by mouth daily with supper., Disp: 90 tablet, Rfl: 3 .  simvastatin (ZOCOR) 20 MG tablet, Take 20 mg by mouth at bedtime. , Disp: , Rfl:  .  traMADol (ULTRAM) 50 MG tablet, Take 50 mg by mouth every 6 (six) hours as needed for moderate pain. , Disp: , Rfl:   Past Medical History: Past Medical History:  Diagnosis Date  . Aortic atherosclerosis ([place])   . Arthritis   . Atrial flutter ([place])    a. diagnosed 05/2018; b. not on [place] 2/2 recurrent life threatening GI bleed; c. CHADS2VASc => 3 (age x 2, vascular disease)  . Cancer [facility]) 2011   prostate  . GI bleed   . History of echocardiogram    a. TTE 05/2018: EF of 60-65%, normal wall motion, mild MR, left atrium normal in size, RVSF normal, PASP 33 mmHg, rhythm was atrial flutter  . History of GI diverticular bleed    extended  stay in the [facility] ICU due to GI bleeding prior to 2016  . HLD (hyperlipidemia)   . Hyperlipidemia   . Hypertension   . Meniscus tear   . OSA on CPAP   . Peripheral neuropathy     Tobacco Use: Social History   Tobacco Use  Smoking Status Former Smoker  . Types: Cigarettes  . Quit date: 05/15/1969  . Years since quitting: 50.8  Smokeless Tobacco Never Used    Labs: Recent Chemical engineer    Labs for ITP Cardiac and Pulmonary Rehab Latest Ref Rng & Units 06/06/2018   Hemoglobin A1c 4.8 - 5.6 % 5.5       Pulmonary Assessment Scores:  Pulmonary Assessment Scores    Row Name 11/27/19 1525 02/23/20 0942 02/23/20 1015     ADL UCSD   SOB Score total 89 89 --   Rest 2 1 --   Walk 4 2 --   Stairs 5 4 --   Bath 5 3 --   Dress 3 2 --   Shop 4 3 --     CAT Score   CAT Score 17 12 --     mMRC Score   mMRC Score 3 -- 1          UCSD: Self-administered  rating of dyspnea associated with activities of daily living (ADLs) 6-point scale (0 = "not at all" to 5 = "maximal or unable to do because of breathlessness")  Scoring Scores range from 0 to 120.  Minimally important difference is 5 units  CAT: CAT can identify the health impairment of COPD patients and is better correlated with disease progression.  CAT has a scoring range of zero to 40. The CAT score is classified into four groups of low (less than 10), medium (10 - 20), high (21-30) and very high (31-40) based on the impact level of disease on health status. A CAT score over 10 suggests significant symptoms.  A worsening CAT score could be explained by an exacerbation, poor medication adherence, poor inhaler technique, or progression of COPD or comorbid conditions.  CAT MCID is 2 points  mMRC: mMRC (Modified Medical Research Council) Dyspnea Scale is used to assess the degree of baseline functional disability in patients of respiratory disease due to dyspnea. No minimal important difference is established. A decrease  in score of 1 point or greater is considered a positive change.   Pulmonary Function Assessment:   Exercise Target Goals: Exercise Program Goal: Individual exercise prescription set using results from initial 6 min walk test and THRR while considering  patient's activity barriers and safety.   Exercise Prescription Goal: Initial exercise prescription builds to 30-45 minutes a day of aerobic activity, 2-3 days per week.  Home exercise guidelines will be given to patient during program as part of exercise prescription that the participant will acknowledge.  Education: Aerobic Exercise & Resistance Training: - Gives group verbal and written instruction on the various components of exercise. Focuses on aerobic and resistive training programs and the benefits of this training and how to safely progress through these programs..   Pulmonary Rehab from 02/18/2020 in [facility] Cardiac and Pulmonary Rehab  Date 12/31/19  Educator [facility]  Instruction Review Code --  [8/25 resistance ]      Education: Exercise & Equipment Safety: - Individual verbal instruction and demonstration of equipment use and safety with use of the equipment.   Pulmonary Rehab from 02/18/2020 in [facility] Cardiac and Pulmonary Rehab  Date 11/27/19  Educator AS  Instruction Review Code 1- Verbalizes Understanding      Education: Exercise Physiology & General Exercise Guidelines: - Group verbal and written instruction with models to review the exercise physiology of the cardiovascular system and associated critical values. Provides general exercise guidelines with specific guidelines to those with heart or lung disease.    Education: Flexibility, Balance, Mind/Body Relaxation: Provides group verbal/written instruction on the benefits of flexibility and balance training, including mind/body exercise modes such as yoga, pilates and tai chi.  Demonstration and skill practice provided.   Activity Barriers & Risk Stratification:  Activity  Barriers & Cardiac Risk Stratification - 11/24/19 1149      Activity Barriers & Cardiac Risk Stratification   Activity Barriers Shortness of Breath;Deconditioning           6 Minute Walk:  6 Minute Walk    Row Name 11/27/19 1518 02/13/20 0955       6 Minute Walk   Phase -- Discharge    Distance 1090 feet 1113 feet    Distance % Change -- 2.11 %    Distance Feet Change -- 23 ft    Walk Time 6 minutes 6 minutes    # of Rest Breaks 0 0    MPH 2.06 2.1    METS  2.4 2.55    RPE 13 13    Perceived Dyspnea  2 3    VO2 Peak 8.5 8.95    Symptoms No No    Resting HR 81 bpm 67 bpm    Resting BP 102/58 108/62    Resting Oxygen Saturation  97 % 98 %    Exercise Oxygen Saturation  during 6 min walk 97 % 96 %    Max Ex. HR 99 bpm 100 bpm    Max Ex. BP 124/58 136/68    2 Minute Post BP 98/54 126/66      Interval HR   1 Minute HR 92 89    2 Minute HR 99 94    3 Minute HR 99 97    4 Minute HR 92 99    5 Minute HR -- 98    6 Minute HR 93 99    2 Minute Post HR 88 73    Interval Heart Rate? Yes Yes      Interval Oxygen   Interval Oxygen? Yes Yes    Baseline Oxygen Saturation % 97 % 98 %    1 Minute Oxygen Saturation % 97 % 98 %    1 Minute Liters of Oxygen 0 L 0 L    2 Minute Oxygen Saturation % 98 % 99 %    2 Minute Liters of Oxygen 0 L 0 L    3 Minute Oxygen Saturation % 97 % 98 %    3 Minute Liters of Oxygen 0 L 0 L    4 Minute Oxygen Saturation % 98 % 98 %    4 Minute Liters of Oxygen 0 L 0 L    5 Minute Oxygen Saturation % 98 % 98 %    5 Minute Liters of Oxygen 0 L 0 L    6 Minute Oxygen Saturation % 98 % 96 %    6 Minute Liters of Oxygen 0 L 0 L    2 Minute Post Oxygen Saturation % 98 % 98 %    2 Minute Post Liters of Oxygen 0 L 0 L          Oxygen Initial Assessment:  Oxygen Initial Assessment - 02/23/20 0941      Home Oxygen   Home Oxygen Device None    Sleep Oxygen Prescription None    Home Exercise Oxygen Prescription None    Home Resting Oxygen  Prescription None    Compliance with Home Oxygen Use Yes      Initial 6 min Walk   Oxygen Used None      Program Oxygen Prescription   Program Oxygen Prescription None           Oxygen Re-Evaluation:  Oxygen Re-Evaluation    Row Name 12/29/19 1022 01/30/20 0940           Program Oxygen Prescription   Program Oxygen Prescription None None        Home Oxygen   Home Oxygen Device None None      Sleep Oxygen Prescription None None      Home Exercise Oxygen Prescription None None      Home Resting Oxygen Prescription None None      Compliance with Home Oxygen Use -- Yes        Goals/Expected Outcomes   Short Term Goals To learn and demonstrate proper pursed lip breathing techniques or other breathing techniques. To learn and understand importance of monitoring SPO2 with pulse oximeter and demonstrate  accurate use of the pulse oximeter.;To learn and understand importance of maintaining oxygen saturations>88%;To learn and demonstrate proper pursed lip breathing techniques or other breathing techniques.      Long  Term Goals Exhibits proper breathing techniques, such as pursed lip breathing or other method taught during program session Verbalizes importance of monitoring SPO2 with pulse oximeter and return demonstration;Maintenance of O2 saturations>88%;Exhibits proper breathing techniques, such as pursed lip breathing or other method taught during program session      Comments Reviewed PLB and using good posture to help reduce SOB. He does not has a pulse oximeter to check his oxygen saturation at home. Explained why it is important to have one. Reviewed that oxygen saturations should be 88 percent and above. Patient has a pulse oximeter at home to check his oxygen.      Goals/Expected Outcomes Short: use PLB when needed Long: become independent with PLB Short: monitor oxygen at home with exertion. Long: maintain oxygen saturations above 88 percent independently.             Oxygen  Discharge (Final Oxygen Re-Evaluation):  Oxygen Re-Evaluation - 01/30/20 0940      Program Oxygen Prescription   Program Oxygen Prescription None      Home Oxygen   Home Oxygen Device None    Sleep Oxygen Prescription None    Home Exercise Oxygen Prescription None    Home Resting Oxygen Prescription None    Compliance with Home Oxygen Use Yes      Goals/Expected Outcomes   Short Term Goals To learn and understand importance of monitoring SPO2 with pulse oximeter and demonstrate accurate use of the pulse oximeter.;To learn and understand importance of maintaining oxygen saturations>88%;To learn and demonstrate proper pursed lip breathing techniques or other breathing techniques.    Long  Term Goals Verbalizes importance of monitoring SPO2 with pulse oximeter and return demonstration;Maintenance of O2 saturations>88%;Exhibits proper breathing techniques, such as pursed lip breathing or other method taught during program session    Comments He does not has a pulse oximeter to check his oxygen saturation at home. Explained why it is important to have one. Reviewed that oxygen saturations should be 88 percent and above. Patient has a pulse oximeter at home to check his oxygen.    Goals/Expected Outcomes Short: monitor oxygen at home with exertion. Long: maintain oxygen saturations above 88 percent independently.           Initial Exercise Prescription:  Initial Exercise Prescription - 11/27/19 1500      Date of Initial Exercise RX and Referring Provider   Date 11/27/19    Referring Provider Gollan      Treadmill   MPH 1.7    Grade 0.5    Minutes 15    METs 2.4      NuStep   Level 2    SPM 80    Minutes 15    METs 2.4      REL-XR   Level 2    Speed 50    Minutes 15    METs 2.4      T5 Nustep   Level 1    SPM 80    Minutes 15    METs 2.4      Prescription Details   Frequency (times per week) 3    Duration Progress to 30 minutes of continuous aerobic without  signs/symptoms of physical distress      Intensity   THRR 40-80% of Max Heartrate 106-131  Ratings of Perceived Exertion 11-15    Perceived Dyspnea 0-4      Resistance Training   Training Prescription Yes    Weight 3lb    Reps 10-15           Perform Capillary Blood Glucose checks as needed.  Exercise Prescription Changes:  Exercise Prescription Changes    Row Name 12/02/19 1300 12/17/19 1200 12/30/19 0700 01/12/20 1600 01/26/20 1300     Response to Exercise   Blood Pressure (Admit) 120/70 122/62 110/62 112/66 124/62   Blood Pressure (Exercise) 130/58 128/64 150/70 128/68 128/62   Blood Pressure (Exit) 110/70 124/80 90/58 126/70 120/60   Heart Rate (Admit) 66 bpm 62 bpm 70 bpm 78 bpm 54 bpm   Heart Rate (Exercise) 96 bpm 101 bpm 92 bpm 91 bpm 95 bpm   Heart Rate (Exit) 92 bpm 94 bpm 73 bpm 83 bpm 81 bpm   Oxygen Saturation (Admit) 98 % 97 % 97 % 98 % 94 %   Oxygen Saturation (Exercise) 96 % 96 % 95 % 97 % 97 %   Oxygen Saturation (Exit) 98 % 97 % 98 % 98 % 98 %   Rating of Perceived Exertion (Exercise) 13 13 13 13 14    Perceived Dyspnea (Exercise) 2 2 3 3 3    Symptoms -- -- -- SOB SOB   Duration Progress to 30 minutes of  aerobic without signs/symptoms of physical distress Progress to 30 minutes of  aerobic without signs/symptoms of physical distress Continue with 30 min of aerobic exercise without signs/symptoms of physical distress. Continue with 30 min of aerobic exercise without signs/symptoms of physical distress. Continue with 30 min of aerobic exercise without signs/symptoms of physical distress.   Intensity THRR unchanged THRR unchanged THRR unchanged THRR unchanged THRR unchanged     Progression   Progression Continue to progress workloads to maintain intensity without signs/symptoms of physical distress. Continue to progress workloads to maintain intensity without signs/symptoms of physical distress. Continue to progress workloads to maintain intensity without  signs/symptoms of physical distress. Continue to progress workloads to maintain intensity without signs/symptoms of physical distress. Continue to progress workloads to maintain intensity without signs/symptoms of physical distress.   Average METs -- 2.73 2.9 3.01 2.8     Resistance Training   Training Prescription Yes Yes Yes Yes Yes   Weight 3 lb 3 lb 3 lb 3 lb 3 lb   Reps 10-15 10-15 10-15 10-15 10-15     Interval Training   Interval Training No No No No No     Treadmill   MPH 1.7 1.7 3 2.8 3   Grade 0.5 0.5 0.5 1 0.5   Minutes 15 15 15 15 15    METs 2.42 2.42 3.5 3.53 3.5     NuStep   Level -- 1 -- 2 --   Minutes -- 15 -- 15 --   METs -- 2.8 -- 2.4 --     REL-XR   Level 2 2 -- 5 --   Minutes 15 15 -- 15 --   METs -- -- -- 3.1 --     T5 Nustep   Level -- -- 1 -- 2   SPM -- -- 80 -- 80   Minutes -- -- 15 -- 15   METs -- -- 2.3 -- 2.6     Home Exercise Plan   Plans to continue exercise at -- -- -- Home (comment)  walking, PT exercises, considering [facility] Home (comment)  walking, PT exercises, considering  [facility]   Frequency -- -- -- Add 2 additional days to program exercise sessions. Add 2 additional days to program exercise sessions.   Initial Home Exercises Provided -- -- -- 12/29/19 12/29/19   Row Name 02/11/20 1200             Response to Exercise   Blood Pressure (Admit) 122/68       Blood Pressure (Exercise) 140/70       Blood Pressure (Exit) 116/56       Heart Rate (Admit) 57 bpm       Heart Rate (Exercise) 103 bpm       Heart Rate (Exit) 88 bpm       Oxygen Saturation (Admit) 97 %       Oxygen Saturation (Exercise) 95 %       Oxygen Saturation (Exit) 98 %       Rating of Perceived Exertion (Exercise) 13       Perceived Dyspnea (Exercise) 3       Symptoms SOB       Duration Continue with 30 min of aerobic exercise without signs/symptoms of physical distress.       Intensity THRR unchanged         Progression   Progression Continue to progress  workloads to maintain intensity without signs/symptoms of physical distress.       Average METs 3.25         Resistance Training   Training Prescription Yes       Weight 5 lb       Reps 10-15         Interval Training   Interval Training No         Treadmill   MPH 2.8       Grade 1       Minutes 15       METs 3.5         REL-XR   Level 5       Minutes 15       METs 2.9         Home Exercise Plan   Plans to continue exercise at Home (comment)  walking, PT exercises, considering [facility]       Frequency Add 2 additional days to program exercise sessions.       Initial Home Exercises Provided 12/29/19              Exercise Comments:  Exercise Comments    Row Name 02/25/20 1013           Exercise Comments Jerrin graduated today from  rehab with 36 sessions completed.  Details of the patient's exercise prescription and what He needs to do in order to continue the prescription and progress were discussed with patient.  Patient was given a copy of prescription and goals.  Patient verbalized understanding.  Darek plans to continue to exercise by joining the [facility]              Exercise Goals and Review:  Exercise Goals    Row Name 11/27/19 1524             Exercise Goals   Increase Physical Activity Yes       Intervention Provide advice, education, support and counseling about physical activity/exercise needs.;Develop an individualized exercise prescription for aerobic and resistive training based on initial evaluation findings, risk stratification, comorbidities and participant's personal goals.       Expected Outcomes Short Term: Attend rehab on a  regular basis to increase amount of physical activity.;Long Term: Add in home exercise to make exercise part of routine and to increase amount of physical activity.;Long Term: Exercising regularly at least 3-5 days a week.       Increase Strength and Stamina Yes       Intervention Provide advice, education, support and  counseling about physical activity/exercise needs.;Develop an individualized exercise prescription for aerobic and resistive training based on initial evaluation findings, risk stratification, comorbidities and participant's personal goals.       Expected Outcomes Short Term: Increase workloads from initial exercise prescription for resistance, speed, and METs.;Short Term: Perform resistance training exercises routinely during rehab and add in resistance training at home;Long Term: Improve cardiorespiratory fitness, muscular endurance and strength as measured by increased METs and functional capacity (6MWT)       Able to understand and use rate of perceived exertion (RPE) scale Yes       Intervention Provide education and explanation on how to use RPE scale       Expected Outcomes Short Term: Able to use RPE daily in rehab to express subjective intensity level;Long Term:  Able to use RPE to guide intensity level when exercising independently       Able to understand and use Dyspnea scale Yes       Intervention Provide education and explanation on how to use Dyspnea scale       Expected Outcomes Short Term: Able to use Dyspnea scale daily in rehab to express subjective sense of shortness of breath during exertion;Long Term: Able to use Dyspnea scale to guide intensity level when exercising independently       Knowledge and understanding of Target Heart Rate Range (THRR) Yes       Intervention Provide education and explanation of THRR including how the numbers were predicted and where they are located for reference       Expected Outcomes Short Term: Able to state/look up THRR;Short Term: Able to use daily as guideline for intensity in rehab;Long Term: Able to use THRR to govern intensity when exercising independently       Able to check pulse independently Yes       Intervention Provide education and demonstration on how to check pulse in carotid and radial arteries.;Review the importance of being able to  check your own pulse for safety during independent exercise       Expected Outcomes Short Term: Able to explain why pulse checking is important during independent exercise;Long Term: Able to check pulse independently and accurately       Understanding of Exercise Prescription Yes       Intervention Provide education, explanation, and written materials on patient's individual exercise prescription       Expected Outcomes Short Term: Able to explain program exercise prescription;Long Term: Able to explain home exercise prescription to exercise independently              Exercise Goals Re-Evaluation :  Exercise Goals Re-Evaluation    Row Name 12/01/19 1104 12/17/19 1228 12/29/19 1038 01/12/20 1623 01/26/20 1313     Exercise Goal Re-Evaluation   Exercise Goals Review Increase Physical Activity;Increase Strength and Stamina;Able to understand and use rate of perceived exertion (RPE) scale;Able to understand and use Dyspnea scale;Knowledge and understanding of Target Heart Rate Range (THRR);Understanding of Exercise Prescription Increase Physical Activity;Increase Strength and Stamina;Able to understand and use rate of perceived exertion (RPE) scale;Able to understand and use Dyspnea scale;Knowledge and understanding of Target Heart  Rate Range (THRR);Understanding of Exercise Prescription Increase Physical Activity;Increase Strength and Stamina;Able to understand and use rate of perceived exertion (RPE) scale;Able to understand and use Dyspnea scale;Knowledge and understanding of Target Heart Rate Range (THRR);Able to check pulse independently;Understanding of Exercise Prescription Increase Physical Activity;Increase Strength and Stamina;Understanding of Exercise Prescription Increase Physical Activity;Increase Strength and Stamina;Understanding of Exercise Prescription   Comments Reviewed RPE and dyspnea scales, THR and program prescription with pt today.  Pt voiced understanding and was given a copy of  goals to take home. Gene has been doing well in rehab. RPE ranges have been within appropriate ranges. Oxygen is maintained well during exericse. Will continue to progress workloads when ready. Continue to monitor. Reviewed home exercise with pt today.  Pt plans to consider [facility] for exercise.  Reviewed THR, pulse, RPE, sign and symptoms, pulse oximetery and when to call 911 or MD.  Also discussed weather considerations and indoor options.  Pt voiced understanding. Gene has been doing well in rehab.  He is now on level 5 for the XR!  We will continue to monitor his progress. Gene attends consistently and works at [employer].  Oxygen levels remain above 95 % during exercise.   Expected Outcomes Short: Use RPE daily to regulate intensity. Long: Follow program prescription in THR. Short: Continue attending rehab regularly Long: Progress MET level and increase strength/ stamina Short: add 1 day of exrecise outside program sessions, check out [employer] Long: continue to build stamina Short: Increase workload on NuStep Long: Continue to improve stamina. Short: continue to attend consistently Long: build overall stamina   Row Name 01/30/20 0935 02/11/20 1257           Exercise Goal Re-Evaluation   Exercise Goals Review Increase Physical Activity;Increase Strength and Stamina Increase Physical Activity;Increase Strength and Stamina      Comments Gene is walking at home and is using his dumbells at home on his off days from rehab. He is trying to stretch more at home. After [name] he is going to workout at home. Gene asked about shoulder pain today - he did it at home getting in bed.  He stated it hurts by poingting to  anterior deltoid area.  This EP reviewed ROM IR/ER and stretches for deltoid and pectorals.  He has been using ice and i recommended he continue.  Staff will follow up.      Expected Outcomes Short: workout at home on a daily bases. LongL Maintain a workout routine independently at home. Short:  try ROM and stretches Long: reduce or eliminate shoulder pain             Discharge Exercise Prescription (Final Exercise Prescription Changes):  Exercise Prescription Changes - 02/11/20 1200      Response to Exercise   Blood Pressure (Admit) 122/68    Blood Pressure (Exercise) 140/70    Blood Pressure (Exit) 116/56    Heart Rate (Admit) 57 bpm    Heart Rate (Exercise) 103 bpm    Heart Rate (Exit) 88 bpm    Oxygen Saturation (Admit) 97 %    Oxygen Saturation (Exercise) 95 %    Oxygen Saturation (Exit) 98 %    Rating of Perceived Exertion (Exercise) 13    Perceived Dyspnea (Exercise) 3    Symptoms SOB    Duration Continue with 30 min of aerobic exercise without signs/symptoms of physical distress.    Intensity THRR unchanged      Progression   Progression Continue to progress workloads to  maintain intensity without signs/symptoms of physical distress.    Average METs 3.25      Resistance Training   Training Prescription Yes    Weight 5 lb    Reps 10-15      Interval Training   Interval Training No      Treadmill   MPH 2.8    Grade 1    Minutes 15    METs 3.5      REL-XR   Level 5    Minutes 15    METs 2.9      Home Exercise Plan   Plans to continue exercise at Home (comment)   walking, PT exercises, considering [facility]   Frequency Add 2 additional days to program exercise sessions.    Initial Home Exercises Provided 12/29/19           Nutrition:  Target Goals: Understanding of nutrition guidelines, daily intake of sodium <1511m, cholesterol <2058m calories 30% from fat and 7% or less from saturated fats, daily to have 5 or more servings of fruits and vegetables.  Education: Controlling Sodium/Reading Food Labels -Group verbal and written material supporting the discussion of sodium use in heart healthy nutrition. Review and explanation with models, verbal and written materials for utilization of the food label.   Education: General Nutrition  Guidelines/Fats and Fiber: -Group instruction provided by verbal, written material, models and posters to present the general guidelines for heart healthy nutrition. Gives an explanation and review of dietary fats and fiber.   [facility]  Date 01/21/20  [facility] Code 1- Verbalizes Understanding      Biometrics:  Pre Biometrics - 11/27/19 1525      Pre Biometrics   Height 5' 10"  (1.778 m)    Weight 170 lb 12.8 oz (77.5 kg)    BMI (Calculated) 24.51    Single Leg Stand 19.13 seconds            Nutrition Therapy Plan and Nutrition Goals:  Nutrition Therapy & Goals - 12/22/19 1048      Nutrition Therapy   Diet High kcal, high protein - ileostomy MNT    Protein (specify units) 100-105g    Fiber --   Low fiber   Whole Grain Foods -1 servings    Saturated Fats 12 max. grams    Fruits and Vegetables 5 servings/day   peeled   Sodium 1.5 grams      Personal Nutrition Goals   Nutrition Goal ST: add boost BID, include more soluble fiber, move big meal to earlier in the day. LT: manage ostomy/slow transit time and decrease food stress, sleep through the night, gain weight/meet needs    Comments Pt primary diagnosis for Pulmonary Rehab is CHF. Hx of lower GI bleeding, recurrent prostate cancer, neuropathy, osteopenia, B12 deficiency, OSA on CPAP, diverticulosis, HLD. 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 Pt also reports having dizzy spells - likely due to both malnutrition  and dehydration - encouraged pt  to drink water during the day to ensure BP doesn't drop too low. Current Relevant Medications: ultram, zocor, prilosec, casodex. Discussed ileostomy MNT. Pt is nervous to eat and has lots of anxiety over eating. Sugested nutritional shakes BID - pt reports having boost, but has not started taking it yet. Pt reports getting up frequently in the middle of the night - discussed moving big meal to earlier in the day. Pt reports changing his bag often and believes he is not absorbing nutrients - discussed expectations for consistency and frequency and then discussed MNT to slow transit time such as increased soluble fiber. Will get more information on exact sugar recommendations and food blockage guidelines.      Intervention Plan   Intervention Prescribe, educate and counsel regarding individualized specific dietary modifications aiming towards targeted core components such as weight, hypertension, lipid management, diabetes, heart failure and other comorbidities.;Nutrition handout(s) given to patient.    Expected Outcomes Short Term Goal: Understand basic principles of dietary content, such as calories, fat, sodium, cholesterol and nutrients.;Short Term Goal: A plan has been developed with personal nutrition goals set during dietitian appointment.;Long Term Goal: Adherence to prescribed nutrition plan.           Nutrition Assessments:  Nutrition Assessments - 02/23/20 0941      MEDFICTS Scores   Post Score 26           MEDIFICTS Score Key:          ?70 Need to make dietary changes          40-70 Heart Healthy Diet         ? 40 Therapeutic Level Cholesterol Diet  Nutrition Goals Re-Evaluation:  Nutrition Goals Re-Evaluation    [place] Name 12/29/19 1004 01/26/20 0954 02/25/20 0954         Goals   Nutrition Goal ST: add boost BID, include more soluble fiber, move big meal to earlier in the day. LT: manage ostomy/slow transit time and decrease food stress, sleep  through the night, gain weight/meet needs ST: add boost BID, include more soluble fiber, have at least 1 good protein source at each meal, slowly add new foods LT: manage ostomy/slow transit time and decrease food stress, sleep through the night, gain weight/meet needs ST: Continue with changes: add boost BID, include more soluble fiber, have at least 1 good protein source at each meal, slowly add new foods LT: manage ostomy/slow transit time and decrease food stress, sleep through the night, gain weight/meet needs     Comment Pt reports eating between 12-2pm, now changing bag ends 8-9pm and he can now sleep during the night. Reviewed examples of soluble fiber and introducing new foods. Pt will try to incorporate boost and more water.  Pt reports not feeling as dizzy. Pt asked many questions regarding transit time, specific foods, soluble vs insoluble fiber Gene reports doing well and has included pinto beans, cabbage, and blueberries to his diet and he is very excited about his progress. Encouraged to see ostomy nurse.     Expected Outcome ST: add boost BID, include more soluble fiber, move big meal to earlier in the day. LT: manage ostomy/slow transit time and decrease food stress, sleep through the night, gain weight/meet needs ST: add boost BID, include more soluble fiber, have at least 1 good protein source at each meal, slowly add new foods LT: manage ostomy/slow transit time and decrease food stress, sleep through the night, gain weight/meet needs Continue with changes. Encouraged to  see ostomy nurse.            Nutrition Goals Discharge (Final Nutrition Goals Re-Evaluation):  Nutrition Goals Re-Evaluation - 02/25/20 0954      Goals   Nutrition Goal ST: Continue with changes: add boost BID, include more soluble fiber, have at least 1 good protein source at each meal, slowly add new foods LT: manage ostomy/slow transit time and decrease food stress, sleep through the night, gain weight/meet needs     Comment Gene reports doing well and has included pinto beans, cabbage, and blueberries to his diet and he is very excited about his progress. Encouraged to see ostomy nurse.    Expected Outcome Continue with changes. Encouraged to see ostomy nurse.           Psychosocial: Target Goals: Acknowledge presence or absence of significant depression and/or stress, maximize coping skills, provide positive support system. Participant is able to verbalize types and ability to use techniques and skills needed for reducing stress and depression.   Education: Depression - Provides group verbal and written instruction on the correlation between heart/lung disease and depressed mood, treatment options, and the stigmas associated with seeking treatment.   Pulmonary Rehab from 02/18/2020 in [facility] Cardiac and Pulmonary Rehab  Date 01/14/20  Educator as  Instruction Review Code 1- Science writer Understanding      Education: Sleep Hygiene -Provides group verbal and written instruction about how sleep can affect your health.  Define sleep hygiene, discuss sleep cycles and impact of sleep habits. Review good sleep hygiene tips.    Education: Stress and Anxiety: - Provides group verbal and written instruction about the health risks of elevated stress and causes of high stress.  Discuss the correlation between heart/lung disease and anxiety and treatment options. Review healthy ways to manage with stress and anxiety.   Initial Review & Psychosocial Screening:  Initial Psych Review & Screening - 11/24/19 1152      Initial Review   Current issues with Current Stress Concerns    Source of Stress Concerns Chronic Illness;Unable to perform yard/household activities;Unable to participate in former interests or hobbies    Comments Used to enjoy golf, has not been able to do since the surgery.      Family Dynamics   Good Support System? Yes   Son, lady next door"watch out for each other" , neighbors as needed   [place] family     Barriers   Psychosocial barriers to participate in program There are no identifiable barriers or psychosocial needs.;The patient should benefit from training in stress management and relaxation.      Screening Interventions   Interventions Encouraged to exercise    Expected Outcomes Short Term goal: Utilizing psychosocial counselor, staff and physician to assist with identification of specific Stressors or current issues interfering with healing process. Setting desired goal for each stressor or current issue identified.;Long Term Goal: Stressors or current issues are controlled or eliminated.;Short Term goal: Identification and review with participant of any Quality of Life or Depression concerns found by scoring the questionnaire.;Long Term goal: The participant improves quality of Life and PHQ9 Scores as seen by post scores and/or verbalization of changes           Quality of Life Scores:  Quality of Life - 11/27/19 1526      Quality of Life   Select Quality of Life      Quality of Life Scores   Health/Function Pre 14.65 %    Socioeconomic Pre 23 %  Psych/Spiritual Pre 12.64 %    Family Pre 24 %    GLOBAL Pre 17.07 %          Scores of 19 and below usually indicate a poorer quality of life in these areas.  A difference of  2-3 points is a clinically meaningful difference.  A difference of 2-3 points in the total score of the Quality of Life Index has been associated with significant improvement in overall quality of life, self-image, physical symptoms, and general health in studies assessing change in quality of life.  PHQ-9: Recent Review Flowsheet Data    Depression screen [facility] 2/9 02/23/2020 01/30/2020 12/29/2019 11/27/2019   Decreased Interest 1 0 1 3    Down, Depressed, Hopeless 1 0 1 3    PHQ - 2 Score 2 0 2 6   Altered sleeping 1 1 2 3     Tired, decreased energy 2 2 1 3     Change in appetite 1 0 0 2   Feeling bad or failure about yourself  1 0 0 0    Trouble concentrating 1 0 1 3    Moving slowly or fidgety/restless 0 0 0 1   Suicidal thoughts 0 0 0 0   PHQ-9 Score 8 3 6 18    Difficult doing work/chores Not difficult at all Not difficult at all Not difficult at all Very difficult     Interpretation of Total Score  Total Score Depression Severity:  1-4 = Minimal depression, 5-9 = Mild depression, 10-14 = Moderate depression, 15-19 = Moderately severe depression, 20-27 = Severe depression   Psychosocial Evaluation and Intervention:  Psychosocial Evaluation - 11/24/19 1214      Psychosocial Evaluation & Interventions   Interventions Encouraged to exercise with the program and follow exercise prescription    Comments Gene has no barriers to starting the program.  He stated that he is excited to get started.  He has had several health issues in the past year. 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 He is looking forward to building his core strenght back and having more energy. HIs sleep cycle has been disrupted since his ileostomy surgery. He is catching naps during the day to help. He has a great support group of people from his sons that visit ofter, a neoghbor that "they keep an eye out on each other" , other neighbors and his church family. Gene is hoping this program will help him gain backthe ability to do some of his life activities. He was playing golf before his surgery. He is looking forward to meeting the RD. He should do well in the program.    Expected Outcomes STG: attend his scheduled sessions.     LTG: have more stamina and energy when graduates from the progam and be able to maintain what he built during the program.    Continue Psychosocial Services  Follow up required by staff           Psychosocial Re-Evaluation:  Psychosocial Re-Evaluation    [place] Name 12/29/19 1012 01/30/20 0945           Psychosocial Re-Evaluation   Current issues with Current Stress Concerns Current Stress Concerns       Comments Reviewed patient health questionnaire (PHQ-9) with patient for follow up. Previously, patients score indicated signs/symptoms of depression.  Reviewed to see if patient is improving symptom wise while in program.  Score improved and patient states that it is because he has been able to  exercise in rehab and has a little more energy. Reviewed patient health questionnaire (PHQ-9) with patient for follow up. Previously, patients score indicated signs/symptoms of depression.  Reviewed to see if patient is improving symptom wise while in program.  Score improved and patient states that it is because his blod pressure has been better and not so low. He feels like his health has improved since he has been coming to rehab.      Expected Outcomes Short: Continue to attend [name] regularly for regular exercise and social engagement. Long: Continue to improve symptoms and manage a positive mental state. Short: Continue to attend [name] regularly for regular exercise and social engagement. Long: Continue to improve symptoms and manage a positive mental state.      Interventions Encouraged to attend Pulmonary Rehabilitation for the exercise Encouraged to attend Pulmonary Rehabilitation for the exercise      Continue Psychosocial Services  Follow up required by staff Follow up required by staff             Psychosocial Discharge (Final Psychosocial Re-Evaluation):  Psychosocial Re-Evaluation - 01/30/20 0945      Psychosocial Re-Evaluation   Current issues with Current Stress Concerns    Comments Reviewed patient health questionnaire (PHQ-9) with patient for follow up. Previously, patients score indicated signs/symptoms of depression.  Reviewed to see if patient is improving symptom wise while in program.  Score improved and patient states that it is because his blod pressure has been better and not so low. He feels like his health has improved since he has been coming to rehab.     Expected Outcomes Short: Continue to attend [name] regularly for regular exercise and social engagement. Long: Continue to improve symptoms and manage a positive mental state.    Interventions Encouraged to attend Pulmonary Rehabilitation for the exercise    Continue Psychosocial Services  Follow up required by staff           Education: Education Goals: Education classes will be provided on a weekly basis, covering required topics. Participant will state understanding/return demonstration of topics presented.  Learning Barriers/Preferences:  Learning Barriers/Preferences - 11/24/19 1159      Learning Barriers/Preferences   Learning Barriers None    Learning Preferences None           General Pulmonary Education Topics:  Infection Prevention: - Provides verbal and written material to individual with discussion of infection control including proper hand washing and proper equipment cleaning during exercise session.   Pulmonary Rehab from 02/18/2020 in [facility] Cardiac and Pulmonary Rehab  Date 11/27/19  Educator As  Instruction Review Code 1- Verbalizes Understanding      Falls Prevention: - Provides verbal and written material to individual with discussion of falls prevention and safety.   Pulmonary Rehab from 02/18/2020 in [facility] Cardiac and Pulmonary Rehab  Date 11/27/19  Educator AS  Instruction Review Code 1- Verbalizes Understanding      Chronic Lung Diseases: - Group verbal and written instruction to review updates, respiratory medications, advancements in procedures and treatments. Discuss use of supplemental oxygen including available portable oxygen systems, continuous and intermittent flow rates, concentrators, personal use and safety guidelines. Review proper use of inhaler and spacers. Provide informative websites for self-education.    Pulmonary Rehab from 02/18/2020 in [facility] Cardiac and Pulmonary Rehab  Date 02/11/20  Educator [facility]  Instruction Review  Code 1- Verbalizes Understanding      Energy Conservation: - Provide group verbal and written instruction for methods to  conserve energy, plan and organize activities. Instruct on pacing techniques, use of adaptive equipment and posture/positioning to relieve shortness of breath.   Pulmonary Rehab from 02/18/2020 in [facility] Cardiac and Pulmonary Rehab  Date 02/11/20  Educator [facility]  Instruction Review Code 1- Verbalizes Understanding      Triggers and Exacerbations: - Group verbal and written instruction to review types of environmental triggers and ways to prevent exacerbations. Discuss weather changes, air quality and the benefits of nasal washing. Review warning signs and symptoms to help prevent infections. Discuss techniques for effective airway clearance, coughing, and vibrations.   Pulmonary Rehab from 02/18/2020 in [facility] Cardiac and Pulmonary Rehab  Date 02/11/20  Educator [facility]  Instruction Review Code 1- Verbalizes Understanding      AED/CPR: - Group verbal and written instruction with the use of models to demonstrate the basic use of the AED with the basic ABC's of resuscitation.   Anatomy and Physiology of the Lungs: - Group verbal and written instruction with the use of models to provide basic lung anatomy and physiology related to function, structure and complications of lung disease.   Pulmonary Rehab from 02/18/2020 in [facility] Cardiac and Pulmonary Rehab  Date 02/11/20  Educator KJ  Instruction Review Code 1- Verbalizes Understanding      Anatomy & Physiology of the Heart: - Group verbal and written instruction and models provide basic cardiac anatomy and physiology, with the coronary electrical and arterial systems. Review of Valvular disease and Heart Failure   Cardiac Medications: - Group verbal and written instruction to review commonly prescribed medications for heart disease. Reviews the medication, class of the drug, and side effects.   Pulmonary Rehab from 02/18/2020  in [facility] Cardiac and Pulmonary Rehab  Date 12/10/19  Educator SB  Instruction Review Code 1- Verbalizes Understanding      Other: -Provides group and verbal instruction on various topics (see comments)   Knowledge Questionnaire Score:  Knowledge Questionnaire Score - 02/23/20 0940      Knowledge Questionnaire Score   Pre Score 21/26 card 12/18 pulm    Post Score 25/26            Core Components/Risk Factors/Patient Goals at Admission:  Personal Goals and Risk Factors at Admission - 11/27/19 1530      Core Components/Risk Factors/Patient Goals on Admission    Weight Management Yes;Weight Maintenance    Intervention Weight Management: Develop a combined nutrition and exercise program designed to reach desired caloric intake, while maintaining appropriate intake of nutrient and fiber, sodium and fats, and appropriate energy expenditure required for the weight goal.;Weight Management: Provide education and appropriate resources to help participant work on and attain dietary goals.    Admit Weight 170 lb 12.8 oz (77.5 kg)    Goal Weight: Short Term 170 lb (77.1 kg)    Goal Weight: Long Term 175 lb (79.4 kg)    Expected Outcomes Short Term: Continue to assess and modify interventions until short term weight is achieved;Long Term: Adherence to nutrition and physical activity/exercise program aimed toward attainment of established weight goal;Weight Maintenance: Understanding of the daily nutrition guidelines, which includes 25-35% calories from fat, 7% or less cal from saturated fats, less than 237m cholesterol, less than 1.5gm of sodium, & 5 or more servings of fruits and vegetables daily    Intervention Provide a combined exercise and nutrition program that is supplemented with education, support and counseling about heart failure. Directed toward relieving symptoms such as shortness of breath, decreased exercise tolerance, and  extremity edema.    Expected Outcomes Improve functional  capacity of life;Short term: Attendance in program 2-3 days a week with increased exercise capacity. Reported lower sodium intake. Reported increased fruit and vegetable intake. Reports medication compliance.;Short term: Daily weights obtained and reported for increase. Utilizing diuretic protocols set by physician.;Long term: Adoption of self-care skills and reduction of barriers for early signs and symptoms recognition and intervention leading to self-care maintenance.    Intervention Provide education and support for participant on nutrition & aerobic/resistive exercise along with prescribed medications to achieve LDL <7m, HDL >470m    Expected Outcomes Short Term: Participant states understanding of desired cholesterol values and is compliant with medications prescribed. Participant is following exercise prescription and nutrition guidelines.;Long Term: Cholesterol controlled with medications as prescribed, with individualized exercise RX and with personalized nutrition plan. Value goals: LDL < 70102mHDL > 40 mg.           Education:Diabetes - Individual verbal and written instruction to review signs/symptoms of diabetes, desired ranges of glucose level fasting, after meals and with exercise. Acknowledge that pre and post exercise glucose checks will be done for 3 sessions at entry of program.   Education: Know Your Numbers and Risk Factors: -Group verbal and written instruction about important numbers in your health.  Discussion of what are risk factors and how they play a role in the disease process.  Review of Cholesterol, Blood Pressure, Diabetes, and BMI and the role they play in your overall health.   [facility]  Date 02/04/20  [facility] Code 1- Verbalizes Understanding      Core Components/Risk Factors/Patient Goals Review:   Goals and Risk Factor Review    [place] Name 12/29/19 1015 01/30/20 0943            Core Components/Risk Factors/Patient Goals Review   Personal Goals Review Weight Management/Obesity;Heart Failure;Lipids Weight Management/Obesity;Improve shortness of breath with ADL's      Review Gene feels the program has been helpful - he doesnt feel as fatigued since he has been exercising.  He does get SOB - right now exercising at a 3.  We reviewed PLB and trying to keep good posture to help breathing.  He is taking meds as directed. Spoke to patient about their shortness of breath and what they can do to improve. Patient has been informed of breathing techniques when starting the program. Patient is informed to tell staff if they have had any med changes and that certain meds they are taking or not taking can be causing shortness of breath.      Expected Outcomes Short: used PLB when needed Long: manage herat failure symptoms Short: Attend [name] regularly to improve shortness of breath with ADL's. Long: maintain independence with ADL's             Core Components/Risk Factors/Patient Goals at Discharge (Final Review):   Goals and Risk Factor Review - 01/30/20 0943      Core Components/Risk Factors/Patient Goals Review   Personal Goals Review Weight Management/Obesity;Improve shortness of breath with ADL's    Review Spoke to patient about their shortness of breath and what they can do to improve. Patient has been informed of breathing techniques when starting the program. Patient is informed to tell staff if they have had any med changes and that certain meds they are taking or not taking can be causing shortness of breath.    Expected Outcomes Short:  Attend [name] regularly to improve shortness of breath with ADL's. Long: maintain independence with ADL's           ITP Comments:  ITP Comments    Row Name 11/24/19 1131 01/21/20 1650 02/18/20 0642 02/25/20 1013     ITP Comments Virtual Orientation completed today . Has EP Eval and gym orientationscheduled on 11/27/19. 30 day  review completed. ITP sent to Dr. Emily Filbert, Medical Director of Cardiac and Pulmonary Rehab. Continue with ITP unless changes are made by physician. 30 Day review completed. Medical Director ITP review done, changes made as directed, and signed approval by Medical Director. Quintavis graduated today from  rehab with 36 sessions completed.  Details of the patient's exercise prescription and what He needs to do in order to continue the prescription and progress were discussed with patient.  Patient was given a copy of prescription and goals.  Patient verbalized understanding.  Garrick plans to continue to exercise by joining the [place]           Comments: discharge ITP

## 2020-03-10 NOTE — Progress Notes (Signed)
° °  03/11/2020  CC:  Chief Complaint  Patient presents with   Cysto    MRA:[id number] Ryan Allison is a 79 y.o. male who presents today for a cystoscopy.   CT A/P in 01/2019 noted nonobstructing right renal calculi. Prior CTs with contrast in 2018 and 2019x2 showed nonobstructing right lower pole calculi. -2 year history of bothersome LUTS which occurred after his ileostomy -During the last visit he had intermittent episodes of gross hematuria with clots were ongoing.  -Tamsulosin discontinued by PCP for hypotension -worsening LUTS since stopping Flomax.  -No flank, abdominal or pelvic pain. -Denies recurrent hematuria. -Reports increase in urinary hesitancy and straining.   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 Treated with EBRT plus six months of ADT (complicated by low energy). --In 06/2010, PSA <0.1 --In 11/2012, PSA 1.9 --In 07/2013, PSA 1.9 --In 08/2016, PSA 11.9. Bone scan and CT scan neg for met --In 09/2016, PET Axumin showed uptake in prostate, L pelvic side wal and aortocaval node. --In 12/2016, [facility] [employer] mutation profiling was negative for mutations. --In 05/2018, CT and bone scan neg for mets. PSA 33.6 with doubling time of 12 months. Bicalutamide started.    Blood pressure (!) 158/85, pulse 77, height $RemoveBe'5\' 8"'WPsjSIMTX$  (1.727 m), weight 166 lb (75.3 kg). NED. A&Ox3.   No respiratory distress   Abd soft, NT, ND Normal phallus with bilateral descended testicles  Cystoscopy Procedure Note  Patient identification was confirmed, informed consent was obtained, and patient was prepped using Betadine solution.  Lidocaine jelly was administered per urethral meatus.     Pre-Procedure: - Inspection reveals a normal caliber urethral meatus.  Procedure: The flexible cystoscope was introduced without difficulty - No urethral  strictures/lesions are present. - Moderate lateral lobe prostate enlargement - Prominent hypervascularity of the prostatic urethra.  - Normal bladder neck - Bilateral ureteral orifices identified - Bladder mucosa  reveals no ulcers, tumors, or lesions - No bladder stones - No trabeculation  Retroflexion shows hypervascularity on the bladder neck.    Post-Procedure: - Patient tolerated the procedure well  Assessment/ Plan:  1. Gross hematuria  Most likely prostatic in etiology.  Advise patient to contact clinic sooner if he experiences episodes of gross hematuria.   Silodosin Rx sent to pharmacy.  Follow up in 6 months.  Fransico Him, am acting as a scribe for Dr. Nicki Reaper C. Cing [place],  I have reviewed the above documentation for accuracy and completeness, and I agree with the above.   Abbie Sons, MD

## 2020-03-11 ENCOUNTER — Other Ambulatory Visit: Payer: Self-pay | Admitting: Urology

## 2020-03-11 ENCOUNTER — Other Ambulatory Visit: Payer: Self-pay

## 2020-03-11 ENCOUNTER — Encounter: Payer: Self-pay | Admitting: Urology

## 2020-03-11 ENCOUNTER — Ambulatory Visit (INDEPENDENT_AMBULATORY_CARE_PROVIDER_SITE_OTHER): Payer: Medicare Other | Admitting: Urology

## 2020-03-11 VITALS — BP 158/85 | HR 77 | Ht 68.0 in | Wt 166.0 lb

## 2020-03-11 DIAGNOSIS — R310 Gross hematuria: Secondary | ICD-10-CM | POA: Diagnosis not present

## 2020-03-11 MED ORDER — SILODOSIN 4 MG PO CAPS: 8.0000 mg | ORAL_CAPSULE | Freq: Every day | ORAL | 1 refills | Status: DC

## 2020-03-12 LAB — URINALYSIS, COMPLETE
Bilirubin, UA: NEGATIVE
Glucose, UA: NEGATIVE
Ketones, UA: NEGATIVE
Leukocytes,UA: NEGATIVE
Nitrite, UA: NEGATIVE
Specific Gravity, UA: >1.03 — ABNORMAL HIGH (ref 1.005–1.030)
Urobilinogen, Ur: 0.2 mg/dL (ref 0.2–1.0)
pH, UA: 5 (ref 5.0–7.5)

## 2020-03-12 LAB — MICROSCOPIC EXAMINATION: Bacteria, UA: NONE SEEN

## 2020-03-15 LAB — CYTOLOGY - NON PAP

## 2020-03-18 ENCOUNTER — Telehealth
Admit: 2020-03-18 | Discharge: 2020-03-19 | Payer: MEDICARE | Attending: Hematology & Oncology | Primary: Hematology & Oncology

## 2020-03-18 DIAGNOSIS — C61 Malignant neoplasm of prostate: Principal | ICD-10-CM

## 2020-04-02 NOTE — Progress Notes (Signed)
Date:  04/05/2020   ID:  Ryan Allison, DOB 1941-02-10, MRN [id number]  Patient Location:  [place] [id number]   Provider location:   [facility], [place] office  PCP:  Adin Hector, MD  Cardiologist:  Arvid Right [facility]   Chief Complaint  Patient presents with  . Follow-up    6 months  Pt states no new cardiac problem--"been about the same"    History of Present Illness:    Ryan Allison is a 79 y.o. male  past medical history of atrial flutter of uncertain chronicity in 05/2018 not on full dose anticoagulation secondary to prior life threatening GI bleeds,  recurrent prostate cancer , PET scan from 04/2018 showing no metastatic disease,  aortic atherosclerosis,  HLD,  OSA on CPAP,  diverticulosis,  peripheral neuropathy,  prior tobacco abuse,  GERD In the hospital  from 1/23 to 1/25 for GI bleed and  atrial flutter Persistent atrial fibrillation since January 2020, persistent Who presents for atrial flutter, shortness of breath/diastolic CHF, weakness  Seen in the clinic September 11, 2019 Cardiac rehab complete, joined [facility] but has not been going very much Had drop in pressure while at cardiac rehab, had medications adjusted Seen in our clinic flomax started, metoprolol dose decreased BP now stable Denies any orthostasis symptoms  Has ileostomy  Weight down 40 pounds over past 2 years  Not eating as much and with hospitalizations for medical issues has lost weight Likely contributor to low blood pressure  Continued weakness  labs today reviewed with him Total chol 125, LDL 42 Normal BMP and CBC  EKG personally reviewed by myself on todays visit NSR rate 57 bpm , rbbb Prior EKG 09/2019 was Atrial flutter rate 76 bpm  Other past medical history reviewed Colectomy at [facility] February 2020 Started on Eliquis 5 twice daily, no recurrent bleeding Was only taking metoprolol once a day Previous office visits not  interested in cardioversion  Echocardiogram Sep 19, 2019,   ejection fraction 50 to 55% Mildly dilated left atrium Rhythm is atrial flutter Mildly elevated right heart pressures  [facility] on 06/06/18 following 2 episodes of BRBPR with associated presyncope, SOB, and palpitations.   in atrial flutter with RVR.   transfusion of pRBC secondary to blood loss. He was rate controlled.   not able to be placed on anticoagulation given recurrent GI bleeding.   colonoscopy on 06/07/2018 that showed possible evidence of diverticular bleed in the sigmoid colon, diverticulosis of the transverse, ascending, and sigmoid colon as well as radiation proctitis. It was recommended he follow up with a colorectal surgeon for possible colectomy due to recurrent diverticular bleed.   Echo during his admission on 06/07/2018 showed an EF of 60-65%, normal wall motion, mild MR, left atrium normal in size, RVSF normal, PASP 33 mmHg, rhythm was atrial flutter.  Seen by PCP on 06/11/2018, EKG showed he remained in atrial flutter  PCP again on 06/17/2018 with a rash that started underneath his axilla, though spread diffusely. He had already self discontinued Cardizem with some improvement in rash. He was placed on metoprolol for rate control of his atrial flutter.  Continues on metoprolol  Prior to GI surgery, had no sx of SOB from his atrial flutter Underwent colectomy at [facility], for diverticuli and GI bleeding, 07/12/18 In follow up had SBO, at [facility] x 1 week Ostomy output has been good.  Drinking Gatorade to stay hydrated  Prior CV studies:  The following studies were reviewed today:  Echo 05/2018 - Left ventricle: The cavity size was normal. Systolic function was   normal. The estimated ejection fraction was in the range of 60%   to 65%. Wall motion was normal; there were no regional wall   motion abnormalities. - Mitral valve: There was mild regurgitation. - Left atrium: The atrium was normal in size. - Right  ventricle: Systolic function was normal. - Pulmonary arteries: Systolic pressure was within the normal   range. PA peak pressure: 33 mm Hg (S).  Impressions: - Rhythm is atrial flutter.   Past Medical History:  Diagnosis Date  . Aortic atherosclerosis ([place])   . Arthritis   . Atrial flutter ([place])    a. diagnosed 05/2018; b. not on [place] 2/2 recurrent life threatening GI bleed; c. CHADS2VASc => 3 (age x 2, vascular disease)  . Cancer [facility]) 2011   prostate  . GI bleed   . History of echocardiogram    a. TTE 05/2018: EF of 60-65%, normal wall motion, mild MR, left atrium normal in size, RVSF normal, PASP 33 mmHg, rhythm was atrial flutter  . History of GI diverticular bleed    extended stay in the [facility] ICU due to GI bleeding prior to 2016  . HLD (hyperlipidemia)   . Hyperlipidemia   . Hypertension   . Meniscus tear   . OSA on CPAP   . Peripheral neuropathy    Past Surgical History:  Procedure Laterality Date  . COLONOSCOPY Left 06/07/2018   Procedure: COLONOSCOPY;  Surgeon: Virgel Manifold, MD;  Location: [facility] ENDOSCOPY;  Service: Endoscopy;  Laterality: Left;  . COLONOSCOPY WITH PROPOFOL N/A 04/06/2015   Procedure: COLONOSCOPY WITH PROPOFOL;  Surgeon: Lollie Sails, MD;  Location: [facility] ENDOSCOPY;  Service: Endoscopy;  Laterality: N/A;  . COLONOSCOPY WITH PROPOFOL N/A 06/07/2018   Procedure: COLONOSCOPY WITH PROPOFOL;  Surgeon: Virgel Manifold, MD;  Location: [facility] ENDOSCOPY;  Service: Endoscopy;  Laterality: N/A;  . HERNIA REPAIR Left 09-23-14   inguinal  . INGUINAL HERNIA REPAIR Left 09/23/2014   Procedure: HERNIA REPAIR INGUINAL ADULT;  Surgeon: Christene Lye, MD;  Location: [facility] ORS;  Service: General;  Laterality: Left;  . MENISCUS REPAIR  2008  . VASCULAR SURGERY  2015     Current Meds  Medication Sig  . acetaminophen (TYLENOL) 500 MG tablet Take 500 mg by mouth daily as needed for fever or pain.   . cyanocobalamin 1000 MCG tablet Take 1,000 mcg by  mouth daily.  Marland Kitchen gabapentin (NEURONTIN) 600 MG tablet Take 600 mg by mouth 5 (five) times daily.   Marland Kitchen omeprazole (PRILOSEC) 40 MG capsule Take 40 mg by mouth daily.  . rivaroxaban (XARELTO) 20 MG TABS tablet Take 1 tablet (20 mg total) by mouth daily with supper.  . silodosin (RAPAFLO) 4 MG CAPS capsule Take 2 capsules (8 mg total) by mouth daily with breakfast.  . simvastatin (ZOCOR) 20 MG tablet Take 20 mg by mouth at bedtime.   . traMADol (ULTRAM) 50 MG tablet Take 50 mg by mouth every 6 (six) hours as needed for moderate pain.   . [DISCONTINUED] metoprolol tartrate (LOPRESSOR) 25 MG tablet      Allergies:   Nsaids, Tolmetin, Celebrex [celecoxib], Diltiazem hcl, Other, and Venlafaxine   Social History   Tobacco Use  . Smoking status: Former Smoker    Types: Cigarettes    Quit date: 05/15/1969    Years since quitting: 50.9  . Smokeless tobacco: Never Used  Vaping Use  . Vaping Use: Never used  Substance Use Topics  . Alcohol use: No  . Drug use: No     Family Hx: The patient's family history includes Cancer in his brother and father; Dementia in his mother.  ROS:   Please see the history of present illness.    Review of Systems  Constitutional: Positive for malaise/fatigue.  Respiratory: Positive for shortness of breath.   Cardiovascular: Negative.   Gastrointestinal: Negative.   Musculoskeletal: Negative.   Neurological: Negative.   Psychiatric/Behavioral: Negative.   All other systems reviewed and are negative.    Labs/Other Tests and Data Reviewed:    Recent Labs: No results found for requested labs within last 8760 hours.   Recent Lipid Panel No results found for: CHOL, TRIG, HDL, CHOLHDL, LDLCALC, LDLDIRECT  Wt Readings from Last 3 Encounters:  04/05/20 167 lb (75.8 kg)  03/11/20 166 lb (75.3 kg)  02/05/20 166 lb (75.3 kg)     Exam:    BP 116/64   Pulse (!) 57   Ht 5\' 8"  (1.727 m)   Wt 167 lb (75.8 kg)   BMI 25.39 kg/m   Constitutional:  oriented  to person, place, and time. No distress.  HENT:  Head: Grossly normal Eyes:  no discharge. No scleral icterus.  Neck: No JVD, no carotid bruits  Cardiovascular: Regular rate and rhythm, no murmurs appreciated Pulmonary/Chest: Clear to auscultation bilaterally, no wheezes or rails Abdominal: Soft.  no distension.  no tenderness.  Musculoskeletal: Normal range of motion Neurological:  normal muscle tone. Coordination normal. No atrophy Skin: Skin warm and dry Psychiatric: normal affect, pleasant  ASSESSMENT & PLAN:    Atrial flutter, unspecified type (HCC) Back in normal sinus rhythm on today's EKG, prior EKG showed atrial flutter May 2021 Either way he is asymptomatic, denies feeling anything Recommend he hold the Lasix on weekends as he appears euvolemic and now in normal sinus rhythm Repeat lab work with primary care several months time, for any prerenal state will need to decrease Lasix further  Chronic diastolic CHF Plan as above, hold Lasix on weekends, May need further decreases for any prerenal lab work Appears euvolemic  Shortness of breath Continues to have weakness, weight loss, unclear if there is component of depression, Pick up an activity with cardiac rehab which she feels helped him a lot Recommend need for continuing at the [facility] to maintain his physical state  Aortic atherosclerosis ([place]) On a statin Cholesterol at goal  OSA on CPAP Managed by primary car  Long discussion with him concerning atrial flutter, leg weakness, gait instability, medication changes, diuretics Family presented with him, all questions answered  Total encounter time more than 35 minutes  Greater than 50% was spent in counseling and coordination of care with the patient   Signed, Ida Rogue, MD  04/05/2020 1:15 PM    [place] [facility] [address], [place], [address] [address]

## 2020-04-05 ENCOUNTER — Encounter: Payer: Self-pay | Admitting: Cardiovascular Disease

## 2020-04-05 ENCOUNTER — Ambulatory Visit (INDEPENDENT_AMBULATORY_CARE_PROVIDER_SITE_OTHER): Payer: Medicare Other | Admitting: Cardiovascular Disease

## 2020-04-05 ENCOUNTER — Other Ambulatory Visit: Payer: Self-pay

## 2020-04-05 VITALS — BP 116/64 | HR 57 | Ht 68.0 in | Wt 167.0 lb

## 2020-04-05 DIAGNOSIS — G4733 Obstructive sleep apnea (adult) (pediatric): Secondary | ICD-10-CM

## 2020-04-05 DIAGNOSIS — E782 Mixed hyperlipidemia: Secondary | ICD-10-CM | POA: Diagnosis not present

## 2020-04-05 DIAGNOSIS — I700 Atherosclerosis of aorta: Secondary | ICD-10-CM | POA: Diagnosis not present

## 2020-04-05 DIAGNOSIS — Z9989 Dependence on other enabling machines and devices: Secondary | ICD-10-CM

## 2020-04-05 DIAGNOSIS — I4892 Unspecified atrial flutter: Secondary | ICD-10-CM | POA: Diagnosis not present

## 2020-04-05 DIAGNOSIS — D62 Acute posthemorrhagic anemia: Secondary | ICD-10-CM

## 2020-04-05 MED ORDER — FUROSEMIDE 20 MG PO TABS: ORAL_TABLET | ORAL | 3 refills | Status: DC

## 2020-04-05 MED ORDER — METOPROLOL TARTRATE 25 MG PO TABS: 12.5000 mg | ORAL_TABLET | Freq: Every day | ORAL | 3 refills | Status: DC

## 2020-04-05 NOTE — Patient Instructions (Addendum)
Medication Instructions:  Metoprolol was decrease to 1/2 tab (12.5mg ) to take daily  Take Lasix 1 tab 20mg  by mouth once a day Monday-Friday, Do not take on Saturday and Sundays   If you need a refill on your cardiac medications before your next appointment, please call your pharmacy.    Lab work: No new labs needed   If you have labs (blood work) drawn today and your tests are completely normal, you will receive your results only by: Marland Kitchen MyChart Message (if you have MyChart) OR . A paper copy in the mail If you have any lab test that is abnormal or we need to change your treatment, we will call you to review the results.   Testing/Procedures: No new testing needed   Follow-Up: At [facility], you and your health needs are our priority.  As part of our continuing mission to provide you with exceptional heart care, we have created designated Provider Care Teams.  These Care Teams include your primary Cardiologist (physician) and Advanced Practice Providers (APPs -  Physician Assistants and Nurse Practitioners) who all work together to provide you with the care you need, when you need it.  . You will need a follow up appointment in 6 months  . Providers on your designated Care Team:   . Murray Hodgkins, NP . Christell Faith, PA-C . Marrianne Mood, PA-C  Any Other Special Instructions Will Be Listed Below (If Applicable).  COVID-19 Vaccine Information can be found at: ShippingScam.co.uk For questions related to vaccine distribution or appointments, please email [email or web address] or call [phone].

## 2020-04-06 ENCOUNTER — Other Ambulatory Visit: Payer: Self-pay | Admitting: Urology

## 2020-05-04 ENCOUNTER — Other Ambulatory Visit: Payer: Self-pay | Admitting: Urology

## 2020-05-06 ENCOUNTER — Encounter: Payer: Self-pay | Admitting: Urology

## 2020-05-15 ENCOUNTER — Other Ambulatory Visit: Payer: Self-pay | Admitting: Urology

## 2020-05-15 MED ORDER — SILODOSIN 8 MG PO CAPS: 8.0000 mg | ORAL_CAPSULE | Freq: Every day | ORAL | 3 refills | Status: DC

## 2020-06-08 ENCOUNTER — Other Ambulatory Visit: Payer: Self-pay | Admitting: *Deleted

## 2020-06-10 ENCOUNTER — Other Ambulatory Visit: Payer: Self-pay | Admitting: *Deleted

## 2020-06-10 MED ORDER — FUROSEMIDE 20 MG PO TABS: ORAL_TABLET | ORAL | 2 refills | Status: DC

## 2020-06-22 ENCOUNTER — Telehealth
Admit: 2020-06-22 | Discharge: 2020-06-23 | Payer: MEDICARE | Attending: Hematology & Oncology | Primary: Hematology & Oncology

## 2020-06-22 DIAGNOSIS — C61 Malignant neoplasm of prostate: Principal | ICD-10-CM

## 2020-06-22 DIAGNOSIS — C778 Secondary and unspecified malignant neoplasm of lymph nodes of multiple regions: Principal | ICD-10-CM

## 2020-09-06 ENCOUNTER — Other Ambulatory Visit: Payer: Self-pay | Admitting: Family

## 2020-09-06 MED ORDER — METOPROLOL TARTRATE 25 MG TABLET
Freq: Every day | ORAL | 0 days
Start: 2020-09-06 — End: ?

## 2020-09-09 ENCOUNTER — Ambulatory Visit: Payer: Self-pay | Admitting: Urology

## 2020-09-16 ENCOUNTER — Other Ambulatory Visit: Payer: Self-pay | Admitting: Cardiovascular Disease

## 2020-09-16 NOTE — Telephone Encounter (Signed)
55m, 75.8kg, scr 1.0 05/12/20, lovw/gollan 04/05/20, ccr 66.3

## 2020-09-16 NOTE — Telephone Encounter (Signed)
Refill Request.  

## 2020-09-30 ENCOUNTER — Ambulatory Visit: Payer: Self-pay | Admitting: Urology

## 2020-10-05 ENCOUNTER — Encounter: Payer: Self-pay | Admitting: Cardiovascular Disease

## 2020-10-05 ENCOUNTER — Other Ambulatory Visit: Payer: Self-pay

## 2020-10-05 ENCOUNTER — Ambulatory Visit (INDEPENDENT_AMBULATORY_CARE_PROVIDER_SITE_OTHER): Payer: Medicare Other | Admitting: Cardiovascular Disease

## 2020-10-05 VITALS — BP 120/60 | HR 55 | Ht 71.0 in | Wt 161.2 lb

## 2020-10-05 DIAGNOSIS — I4892 Unspecified atrial flutter: Secondary | ICD-10-CM

## 2020-10-05 DIAGNOSIS — E782 Mixed hyperlipidemia: Secondary | ICD-10-CM

## 2020-10-05 DIAGNOSIS — G4733 Obstructive sleep apnea (adult) (pediatric): Secondary | ICD-10-CM

## 2020-10-05 DIAGNOSIS — K922 Gastrointestinal hemorrhage, unspecified: Secondary | ICD-10-CM

## 2020-10-05 DIAGNOSIS — I700 Atherosclerosis of aorta: Secondary | ICD-10-CM | POA: Diagnosis not present

## 2020-10-05 DIAGNOSIS — Z9989 Dependence on other enabling machines and devices: Secondary | ICD-10-CM

## 2020-10-05 DIAGNOSIS — D62 Acute posthemorrhagic anemia: Secondary | ICD-10-CM

## 2020-10-05 NOTE — Patient Instructions (Signed)
Medication Instructions:  No changes  If you need a refill on your cardiac medications before your next appointment, please call your pharmacy.    Lab work: No new labs needed   If you have labs (blood work) drawn today and your tests are completely normal, you will receive your results only by: . MyChart Message (if you have MyChart) OR . A paper copy in the mail If you have any lab test that is abnormal or we need to change your treatment, we will call you to review the results.   Testing/Procedures: No new testing needed   Follow-Up: At [facility], you and your health needs are our priority.  As part of our continuing mission to provide you with exceptional heart care, we have created designated Provider Care Teams.  These Care Teams include your primary Cardiologist (physician) and Advanced Practice Providers (APPs -  Physician Assistants and Nurse Practitioners) who all work together to provide you with the care you need, when you need it.  . You will need a follow up appointment in 12 months  . Providers on your designated Care Team:   . Christopher Berge, NP . Ryan Dunn, PA-C . Jacquelyn Visser, PA-C  Any Other Special Instructions Will Be Listed Below (If Applicable).  COVID-19 Vaccine Information can be found at: [email or web address] For questions related to vaccine distribution or appointments, please email [email or web address] or call [phone].     

## 2020-10-05 NOTE — Progress Notes (Signed)
Date:  10/05/2020   ID:  Ryan Allison, Ryan Allison 07-24-1940, MRN [id number]  Patient Location:  [place] [id number]   Provider location:   [facility], [place] office  PCP:  Adin Hector, MD  Cardiologist:  Arvid Right [facility]   Chief Complaint  Patient presents with  . 6 month follow up     Patient c/o pounding in chest at times. Medications reviewed by the patient verbally.     History of Present Illness:    Ryan Allison is a 80 y.o. male  past medical history of atrial flutter of uncertain chronicity in 05/2018 not on full dose anticoagulation secondary to prior life threatening GI bleeds,  recurrent prostate cancer , PET scan from 04/2018 showing no metastatic disease,  aortic atherosclerosis,  HLD,  OSA on CPAP,  diverticulosis,  peripheral neuropathy,  prior tobacco abuse,  GERD In the hospital  from 06/06/2018 to 06/08/18 for GI bleed and  atrial flutter Persistent atrial fibrillation since January 2020, persistent Who presents for atrial flutter, shortness of breath/diastolic CHF, weakness  Last seen by myself in clinic November 2021 No recent hospitalizations  Rare strong beats Not much tachycardia On metoprolol 12.5 bid  Weight down 167 to 161 Has had a long trend down also with muscle atrophy, Increasing leg weakness, some gait instability Significant other who presents with him, he has not been active, has not been exercising  Has a membership to [employer], has not been going Previously completed cardiac rehab   Has ileostomy, reports everything comes out too quickly  labs today reviewed with him Total chol 125, LDL 42 Normal BMP and CBC  EKG personally reviewed by myself on todays visit NSR rate 55 bpm , rbbb Prior EKG 09/2019 was Atrial flutter rate 76 bpm  Other past medical history reviewed Colectomy at [facility] February 2020 Started on Eliquis 5 twice daily, no recurrent bleeding Was only taking metoprolol  once a day Previous office visits not interested in cardioversion  Echocardiogram Sep 19, 2019,   ejection fraction 50 to 55% Mildly dilated left atrium Rhythm is atrial flutter Mildly elevated right heart pressures  [facility] on 06/06/18 following 2 episodes of BRBPR with associated presyncope, SOB, and palpitations.   in atrial flutter with RVR.   transfusion of pRBC secondary to blood loss. He was rate controlled.   not able to be placed on anticoagulation given recurrent GI bleeding.   colonoscopy on 06/07/2018 that showed possible evidence of diverticular bleed in the sigmoid colon, diverticulosis of the transverse, ascending, and sigmoid colon as well as radiation proctitis. It was recommended he follow up with a colorectal surgeon for possible colectomy due to recurrent diverticular bleed.   Echo during his admission on 06/07/2018 showed an EF of 60-65%, normal wall motion, mild MR, left atrium normal in size, RVSF normal, PASP 33 mmHg, rhythm was atrial flutter.  Seen by PCP on 06/11/2018, EKG showed he remained in atrial flutter  PCP again on 06/17/2018 with a rash that started underneath his axilla, though spread diffusely. He had already self discontinued Cardizem with some improvement in rash. He was placed on metoprolol for rate control of his atrial flutter.  Continues on metoprolol  Prior to GI surgery, had no sx of SOB from his atrial flutter Underwent colectomy at [facility], for diverticuli and GI bleeding, 07/12/18 In follow up had SBO, at [facility] x 1 week Ostomy output has been good.  Drinking  Gatorade to stay hydrated  Prior CV studies:   The following studies were reviewed today:  Echo 05/2018 - Left ventricle: The cavity size was normal. Systolic function was   normal. The estimated ejection fraction was in the range of 60%   to 65%. Wall motion was normal; there were no regional wall   motion abnormalities. - Mitral valve: There was mild regurgitation. - Left atrium: The atrium  was normal in size. - Right ventricle: Systolic function was normal. - Pulmonary arteries: Systolic pressure was within the normal   range. PA peak pressure: 33 mm Hg (S).  Impressions: - Rhythm is atrial flutter.   Past Medical History:  Diagnosis Date  . Aortic atherosclerosis ([place])   . Arthritis   . Atrial flutter ([place])    a. diagnosed 05/2018; b. not on [place] 2/2 recurrent life threatening GI bleed; c. CHADS2VASc => 3 (age x 2, vascular disease)  . Cancer [facility]) 2011   prostate  . GI bleed   . History of echocardiogram    a. TTE 05/2018: EF of 60-65%, normal wall motion, mild MR, left atrium normal in size, RVSF normal, PASP 33 mmHg, rhythm was atrial flutter  . History of GI diverticular bleed    extended stay in the [facility] ICU due to GI bleeding prior to 2016  . HLD (hyperlipidemia)   . Hyperlipidemia   . Hypertension   . Meniscus tear   . OSA on CPAP   . Peripheral neuropathy    Past Surgical History:  Procedure Laterality Date  . COLONOSCOPY Left 06/07/2018   Procedure: COLONOSCOPY;  Surgeon: Virgel Manifold, MD;  Location: [facility] ENDOSCOPY;  Service: Endoscopy;  Laterality: Left;  . COLONOSCOPY WITH PROPOFOL N/A 04/06/2015   Procedure: COLONOSCOPY WITH PROPOFOL;  Surgeon: Lollie Sails, MD;  Location: [facility] ENDOSCOPY;  Service: Endoscopy;  Laterality: N/A;  . COLONOSCOPY WITH PROPOFOL N/A 06/07/2018   Procedure: COLONOSCOPY WITH PROPOFOL;  Surgeon: Virgel Manifold, MD;  Location: [facility] ENDOSCOPY;  Service: Endoscopy;  Laterality: N/A;  . HERNIA REPAIR Left 09-23-14   inguinal  . INGUINAL HERNIA REPAIR Left 09/23/2014   Procedure: HERNIA REPAIR INGUINAL ADULT;  Surgeon: Christene Lye, MD;  Location: [facility] ORS;  Service: General;  Laterality: Left;  . MENISCUS REPAIR  2008  . VASCULAR SURGERY  2015     Current Meds  Medication Sig  . acetaminophen (TYLENOL) 500 MG tablet Take 500 mg by mouth daily as needed for fever or pain.   . cyanocobalamin 1000 MCG  tablet Take 1,000 mcg by mouth daily.  . furosemide (LASIX) 20 MG tablet Take 1 tab 20mg  by mouth once a day Monday-Friday, Do not take on Saturday and Sundays  . gabapentin (NEURONTIN) 600 MG tablet Take 600 mg by mouth 5 (five) times daily.   . metoprolol tartrate (LOPRESSOR) 25 MG tablet Take 0.5 tablets (12.5 mg total) by mouth daily.  Marland Kitchen omeprazole (PRILOSEC) 40 MG capsule Take 40 mg by mouth daily.  . simvastatin (ZOCOR) 20 MG tablet Take 20 mg by mouth at bedtime.   . traMADol (ULTRAM) 50 MG tablet Take 50 mg by mouth every 6 (six) hours as needed for moderate pain.   Marland Kitchen XARELTO 20 MG TABS tablet TAKE 1 TABLET DAILY WITH SUPPER     Allergies:   Nsaids, Tolmetin, Celecoxib, Diltiazem hcl, Other, and Venlafaxine   Social History   Tobacco Use  . Smoking status: Former Smoker    Types: Cigarettes    Quit  date: 05/15/1969    Years since quitting: 51.4  . Smokeless tobacco: Never Used  Vaping Use  . Vaping Use: Never used  Substance Use Topics  . Alcohol use: No  . Drug use: No     Family Hx: The patient's family history includes Cancer in his brother and father; Dementia in his mother.  ROS:   Please see the history of present illness.    Review of Systems  Constitutional: Positive for malaise/fatigue.  Respiratory: Positive for shortness of breath.   Cardiovascular: Negative.   Gastrointestinal: Negative.   Musculoskeletal: Negative.   Neurological: Negative.   Psychiatric/Behavioral: Negative.   All other systems reviewed and are negative.    Labs/Other Tests and Data Reviewed:    Recent Labs: No results found for requested labs within last 8760 hours.   Recent Lipid Panel No results found for: CHOL, TRIG, HDL, CHOLHDL, LDLCALC, LDLDIRECT  Wt Readings from Last 3 Encounters:  10/05/20 161 lb 4 oz (73.1 kg)  04/05/20 167 lb (75.8 kg)  03/11/20 166 lb (75.3 kg)     Exam:    BP 120/60 (BP Location: Left Arm, Patient Position: Sitting, Cuff Size: Normal)    Pulse (!) 55   Ht 5\' 11"  (1.803 m)   Wt 161 lb 4 oz (73.1 kg)   SpO2 98%   BMI 22.49 kg/m  Constitutional:  oriented to person, place, and time. No distress.  HENT:  Head: Grossly normal Eyes:  no discharge. No scleral icterus.  Neck: No JVD, no carotid bruits  Cardiovascular: Regular rate and rhythm, no murmurs appreciated Pulmonary/Chest: Clear to auscultation bilaterally, no wheezes or rails Abdominal: Soft.  no distension.  no tenderness.  Musculoskeletal: Normal range of motion Neurological:  normal muscle tone. Coordination normal. No atrophy Skin: Skin warm and dry Psychiatric: normal affect, pleasant  ASSESSMENT & PLAN:    Atrial flutter, unspecified type (HCC) Maintaining normal sinus rhythm prior EKG showed atrial flutter May 2021 Continue very low-dose metoprolol, Low blood pressure at home, unable to advance beta-blocker Could take extra half dose as needed for breakthrough tachycardia  Chronic diastolic CHF  euvolemic Weight down, poor calorie intake, no edema  Aortic atherosclerosis (HCC) On a statin Cholesterol at goal  OSA on CPAP Managed by primary care  Weight loss/weakness Long discussion concerning need to increase calorie intake, He is having muscle atrophy, sedentary Stressed with her his partner who presents today that he needs to get going Likely component of depression   Total encounter time more than 35 minutes  Greater than 50% was spent in counseling and coordination of care with the patient  Signed, Ida Rogue, MD  10/05/2020 10:36 AM    [place] Office [address], [place], [place] [address]

## 2020-10-13 ENCOUNTER — Ambulatory Visit (INDEPENDENT_AMBULATORY_CARE_PROVIDER_SITE_OTHER): Payer: Medicare Other | Admitting: Urology

## 2020-10-13 ENCOUNTER — Other Ambulatory Visit: Payer: Self-pay

## 2020-10-13 ENCOUNTER — Encounter: Payer: Self-pay | Admitting: Urology

## 2020-10-13 VITALS — BP 126/73 | HR 65 | Ht 71.0 in | Wt 157.0 lb

## 2020-10-13 DIAGNOSIS — C61 Malignant neoplasm of prostate: Secondary | ICD-10-CM

## 2020-10-13 DIAGNOSIS — R399 Unspecified symptoms and signs involving the genitourinary system: Secondary | ICD-10-CM | POA: Diagnosis not present

## 2020-10-13 DIAGNOSIS — R310 Gross hematuria: Secondary | ICD-10-CM | POA: Diagnosis not present

## 2020-10-13 NOTE — Progress Notes (Addendum)
10/13/2020 10:16 AM   Ryan Allison 1941-02-01 [id number]  Referring provider: Adin Hector, MD [place] [facility] [place],  [place] [address]  Chief Complaint  Patient presents with  . Hematuria    Urologic history:  1.  Lower urinary tract symptoms  Obstructive voiding symptoms  Orthostatic changes with tamsulosin; no improvement on still it is  Cystoscopy 03/11/2020 with moderate lateral lobe enlargement with prominent hypervascularity  2.  Recurrent gross hematuria  Cystoscopy as above  CT 01/2019 with right nephrolithiasis  3.  Intermediate risk prostate cancer  Followed [facility] oncology  Initially treated EBRT + ADT 2011  Nodal mets via PET Axumin 2018  Started bicalutamide monotherapy with PSA 33.6; decreased to 0.3 on bicalutamide however this was stopped secondary to questionable side effects 2021  Currently monitored at [facility]   HPI: 80 y.o. male presents for semiannual follow-up.   Since his last visit he has had some intermittent hematuria the last episode which lasted 2-3 days  He did not feel he had significant improvement in his voiding symptoms on silodosin  Denies flank, abdominal or pelvic pain   PMH: Past Medical History:  Diagnosis Date  . Aortic atherosclerosis ([place])   . Arthritis   . Atrial flutter ([place])    a. diagnosed 05/2018; b. not on [place] 2/2 recurrent life threatening GI bleed; c. CHADS2VASc => 3 (age x 2, vascular disease)  . Cancer [facility]) 2011   prostate  . GI bleed   . History of echocardiogram    a. TTE 05/2018: EF of 60-65%, normal wall motion, mild MR, left atrium normal in size, RVSF normal, PASP 33 mmHg, rhythm was atrial flutter  . History of GI diverticular bleed    extended stay in the [facility] ICU due to GI bleeding prior to 2016  . HLD (hyperlipidemia)   . Hyperlipidemia   . Hypertension   . Meniscus tear   . OSA on CPAP   . Peripheral neuropathy     Surgical History: Past Surgical  History:  Procedure Laterality Date  . COLONOSCOPY Left 06/07/2018   Procedure: COLONOSCOPY;  Surgeon: Virgel Manifold, MD;  Location: [facility] ENDOSCOPY;  Service: Endoscopy;  Laterality: Left;  . COLONOSCOPY WITH PROPOFOL N/A 04/06/2015   Procedure: COLONOSCOPY WITH PROPOFOL;  Surgeon: Lollie Sails, MD;  Location: [facility] ENDOSCOPY;  Service: Endoscopy;  Laterality: N/A;  . COLONOSCOPY WITH PROPOFOL N/A 06/07/2018   Procedure: COLONOSCOPY WITH PROPOFOL;  Surgeon: Virgel Manifold, MD;  Location: [facility] ENDOSCOPY;  Service: Endoscopy;  Laterality: N/A;  . HERNIA REPAIR Left 09-23-14   inguinal  . INGUINAL HERNIA REPAIR Left 09/23/2014   Procedure: HERNIA REPAIR INGUINAL ADULT;  Surgeon: Christene Lye, MD;  Location: [facility] ORS;  Service: General;  Laterality: Left;  . MENISCUS REPAIR  2008  . VASCULAR SURGERY  2015    Home Medications:  Allergies as of 10/13/2020      Reactions   Nsaids Other (See Comments)   Diverticular bleed Diverticular bleed   Tolmetin    Other reaction(s): Other (See Comments) Diverticular bleed Diverticular bleed   Celecoxib Itching, Rash   Diltiazem Hcl Rash   Other Other (See Comments)   Diverticular bleed   Venlafaxine Other (See Comments)   sedation sedation      Medication List       Accurate as of October 13, 2020 10:16 AM. If you have any questions, ask your nurse or doctor.  acetaminophen 500 MG tablet Commonly known as: TYLENOL Take 500 mg by mouth daily as needed for fever or pain.   cyanocobalamin 1000 MCG tablet Take 1,000 mcg by mouth daily.   furosemide 20 MG tablet Commonly known as: LASIX Take 1 tab 20mg  by mouth once a day Monday-Friday, Do not take on Saturday and Sundays   gabapentin 600 MG tablet Commonly known as: NEURONTIN Take 600 mg by mouth 5 (five) times daily.   metoprolol tartrate 25 MG tablet Commonly known as: LOPRESSOR Take 0.5 tablets (12.5 mg total) by mouth daily.   omeprazole 40 MG  capsule Commonly known as: PRILOSEC Take 40 mg by mouth daily.   simvastatin 20 MG tablet Commonly known as: ZOCOR Take 20 mg by mouth at bedtime.   traMADol 50 MG tablet Commonly known as: ULTRAM Take 50 mg by mouth every 6 (six) hours as needed for moderate pain.   Xarelto 20 MG Tabs tablet Generic drug: rivaroxaban TAKE 1 TABLET DAILY WITH SUPPER       Allergies:  Allergies  Allergen Reactions  . Nsaids Other (See Comments)    Diverticular bleed Diverticular bleed  . Tolmetin     Other reaction(s): Other (See Comments) Diverticular bleed Diverticular bleed  . Celecoxib Itching and Rash  . Diltiazem Hcl Rash  . Other Other (See Comments)    Diverticular bleed  . Venlafaxine Other (See Comments)    sedation sedation    Family History: Family History  Problem Relation Age of Onset  . Dementia Mother   . Cancer Father   . Cancer Brother     Social History:  reports that he quit smoking about 51 years ago. His smoking use included cigarettes. He has never used smokeless tobacco. He reports that he does not drink alcohol and does not use drugs.   Physical Exam: BP 126/73   Pulse 65   Ht 5\' 11"  (1.803 m)   Wt 157 lb (71.2 kg)   BMI 21.90 kg/m   Constitutional:  Alert and oriented, No acute distress. HEENT: [place] AT, moist mucus membranes.  Trachea midline, no masses. Cardiovascular: No clubbing, cyanosis, or edema. Respiratory: Normal respiratory effort, no increased work of breathing.   Assessment & Plan:    1. Gross hematuria  Felt to be prostatic in origin based on prior cystoscopy  Instructed to call for continued hematuria and will schedule repeat cystoscopy and CT urogram  If okay with cardiology would hold Xarelto for 24 hours as needed for gross hematuria.  Will message Dr. Rockey Situ  2.  Lower urinary tract symptoms  No improvement on silodosin and he will discontinue  3.  Prostate cancer  Continue [facility] oncology follow-up  Follow-up 6  months    Abbie Sons, MD  [place] [address], [place], [place] [address] [phone]

## 2020-10-14 LAB — URINALYSIS, COMPLETE
Bilirubin, UA: NEGATIVE
Glucose, UA: NEGATIVE
Ketones, UA: NEGATIVE
Leukocytes,UA: NEGATIVE
Nitrite, UA: NEGATIVE
Protein,UA: NEGATIVE
Specific Gravity, UA: 1.02 (ref 1.005–1.030)
Urobilinogen, Ur: 0.2 mg/dL (ref 0.2–1.0)
pH, UA: 5.5 (ref 5.0–7.5)

## 2020-10-14 LAB — MICROSCOPIC EXAMINATION: Bacteria, UA: NONE SEEN

## 2020-10-18 DIAGNOSIS — C61 Malignant neoplasm of prostate: Principal | ICD-10-CM

## 2020-10-18 DIAGNOSIS — Z125 Encounter for screening for malignant neoplasm of prostate: Principal | ICD-10-CM

## 2020-10-21 ENCOUNTER — Telehealth
Admit: 2020-10-21 | Discharge: 2020-10-22 | Payer: MEDICARE | Attending: Hematology & Oncology | Primary: Hematology & Oncology

## 2020-10-21 DIAGNOSIS — C61 Malignant neoplasm of prostate: Principal | ICD-10-CM

## 2020-10-21 MED ORDER — RELUGOLIX 120 MG TABLET
ORAL_TABLET | ORAL | 0 refills | 30 days | Status: CP
Start: 2020-10-21 — End: 2020-11-20
  Filled 2020-11-04: qty 30, 28d supply, fill #0

## 2020-10-22 DIAGNOSIS — C61 Malignant neoplasm of prostate: Principal | ICD-10-CM

## 2020-10-24 ENCOUNTER — Telehealth: Payer: Self-pay | Admitting: Urology

## 2020-10-24 NOTE — Telephone Encounter (Signed)
Please let Mr. Traynham know that Dr. Rockey Situ said it was okay to hold Xarelto for 1 to 2 days if he notes recurrent blood in the urine.

## 2020-10-26 ENCOUNTER — Encounter: Payer: Self-pay | Admitting: *Deleted

## 2020-10-26 NOTE — Telephone Encounter (Signed)
Notified patient as instructed, patient pleased. Discussed follow-up appointments, patient agrees  

## 2020-10-27 ENCOUNTER — Telehealth: Payer: Self-pay | Admitting: Urology

## 2020-10-27 DIAGNOSIS — C61 Malignant neoplasm of prostate: Secondary | ICD-10-CM

## 2020-10-27 NOTE — Telephone Encounter (Signed)
Pt stopped by office to let you know he is having symptoms and needs an RX for Tamsulosin sent to [place]

## 2020-10-27 NOTE — Telephone Encounter (Signed)
Patient called back and left a message on the triage line requesting the prescription.   Please contact the patient at [phone].

## 2020-10-28 NOTE — Telephone Encounter (Signed)
Chart list prior dizziness with tamsulosin.  Is he able to tolerate this medication?  Please find out what his symptoms are as we might be able to try a different medication.

## 2020-10-29 MED ORDER — ALFUZOSIN HCL ER 10 MG PO TB24: 10.0000 mg | ORAL_TABLET | Freq: Every day | ORAL | 0 refills | Status: DC

## 2020-10-29 NOTE — Addendum Note (Signed)
Addended by: Chrystie Nose on: 10/29/2020 08:56 AM   Modules accepted: Orders

## 2020-11-02 NOTE — Unmapped (Signed)
[facility] Specialty Medication Onboarding    Specialty Medication: Orgovyx  Prior Authorization: Approved   Financial Assistance: No - copay card or gant not available   Final Copay/Day Supply: $50 / 28 days (first fill) $50/30 days (maintenance fill)    Insurance Restrictions: None     Notes to Pharmacist:     The triage team has completed the benefits investigation and has determined that the patient is able to fill this medication at [facility]. Please contact the patient to complete the onboarding or follow up with the prescribing physician as needed.

## 2020-11-04 NOTE — Unmapped (Signed)
[facility]   Patient Onboarding/Medication Counseling    [name] is a 80 y.o. male with malignant neoplasm of prostate who I am counseling today on initiation of therapy.  I am speaking to the patient.    Was a Nurse, learning disability used for this call? No    Verified patient's date of birth / HIPAA.    Specialty medication(s) to be sent: Hematology/Oncology: Orgovyx    Non-specialty medications/supplies to be sent: none    Medications not needed at this time: none     Orgovyx (relugolix)    Medication & Administration     Dosage: Take 3 tablets (360 mg total) by mouth on day 1, then take 1 tablet (120 mg total) daily    Administration:   ??? May be administered with or without food.  ??? Take at the same time every day.  ??? Swallow tablets whole. Do not crush or chew tablets.    Adherence/Missed dose instructions:   ??? Take a missed dose as soon as you think about.  ??? If it is less than 12 hours until the next dose, skip the missed dose and go back to your normal time.   ??? Do not take 2 doses at the same time or extra doses.   ??? If treatment is stopped for more than 7 days loading dose may need to be repeated (360 mg = 3 tablets)    Goals of Therapy     ??? To prevent cancer progression    Side Effects & Monitoring Parameters     Commonly reported side effects  ??? Fatigue or loss of strength/energy (26%)  ??? Hot flashes (54%)  ??? Muscle and joint pain (30%)   ??? Possible increase in Triglycerides (35%)  ??? May increase blood sugar levels (44%)  ??? Constipation or Diarrhea (12%)  ??? Trouble sleeping (less than 10%)  ??? Weight gain (less than 10%)  ??? Sexual dysfunction (less than 10%)  ??? Enlarged, tender or sore breasts (less than 10%)  ??? Testicle changes (less than 10%)    The following side effects should be reported to the provider:  ??? High blood sugar (confusion, feeling sleepy, increased thirst/hunger/urination, flushing, fast breathing, or fruity breath) (44%)  ??? Problems with liver (yellowing of the skin or eyes, darkening of urine, or abdominal pain) (18-27%)  ??? Chest pain or pressure, a fast heartbeat, or an abnormal heartbeat. (3%)  ??? Any abnormal heartbeat or signs of fluid and electrolyte problems (mood changes, confusion, muscle pain or weakness, abnormal/fast heartbeat, severe dizziness, passing out)  ??? Signs or symptoms suggestive of a stroke (change in strength on one side being greater than the other, trouble speaking/thinking, change in balance, or vision changes)  ??? Behavior/mood changes (acting aggressive, crying, depression, emotional ups and downs, restlessness, and feeling angry or irritable)  ??? Signs of anaphylaxis (wheezing, chest tightness, swelling of face, lips, tongue or throat)    Monitoring Parameters:  ??? Baseline PSA and testosterone levels and as clinically appropriate  ??? EKG and electrolytes as clinically appropriate  ??? Blood glucose and serum triglycerides periodically during therapy  ??? Hemoglobin and liver functions tests periodically during therapy  ??? Monitor adherence    Contraindications, Warnings, & Precautions     ??? QT prolongation: Androgen deprivation therapy (achieved by relugolix) may prolong QT/QTc interval.   ??? Cardiovascular risk: Androgen deprivation therapy may increase cardiovascular risk, especially in those with history of cardiovascular events. While relugolix is associated with 54% less  risk of major adverse cardiovascular events compared to leuprolide, the risk still exists (Shores et al. [place] Dessert Study. NEJM 2020).  ??? Insulin resistance: Androgen deprivation therapy may contribute to insulin resistance and diabetes.  ??? Reproductive Considerations: Relugolix may impact male fertility.   o Males with male partners of reproductive potential should use effective contraception during therapy and for 2 weeks after the last relugolix dose.    Drug/Food Interactions     ??? Medication list reviewed in Epic. The patient was instructed to inform the care team before taking any new medications or supplements. No drug interactions identified.     Storage, Handling Precautions, & Disposal     ??? Store at room temperature in the original container (do not use a pillbox or store with other medications). Do not take out of the anti-moisture cube or packet.   ??? Store in a dry place.  Do not store in a bathroom.    ??? Caregivers helping administer medication should wear gloves and wash hands immediately after. Caregivers who are pregnant or breastfeeding should not prepare doses.   ??? Keep the lid tightly closed. Keep out of the reach of children and pets.  ??? Do not flush down a toilet or pour down a drain unless instructed to do so.  Check with your local police department or fire station about drug take-back programs in your area    Current Medications (including OTC/herbals), Comorbidities and Allergies     Current Outpatient Medications   Medication Sig Dispense Refill   ??? acetaminophen (TYLENOL) 500 MG tablet Take 500 mg by mouth every six (6) hours as needed.      ??? apixaban (ELIQUIS) 5 mg Tab Take 5 mg by mouth Two (2) times a day.     ??? bicalutamide (CASODEX) 50 MG tablet Take 1 tablet (50 mg total) by mouth daily. 30 tablet 11   ??? clindamycin (CLEOCIN T) 1 % external solution once as needed.     ??? cyanocobalamin 1000 MCG tablet Take 1,000 mcg by mouth daily.     ??? furosemide (LASIX) 20 MG tablet 20 mg  in the morning.     ??? gabapentin (NEURONTIN) 600 MG tablet Take 600 mg by mouth Four (4) times a day with a meal and nightly.      ??? metoprolol tartrate (LOPRESSOR) 50 MG tablet Take 12.5 mg by mouth daily. Take 12.5mg  (1/2 tablet) daily     ??? omeprazole (PRILOSEC) 40 MG capsule Take 1 capsule by mouth once daily.     ??? relugolix (ORGOVYX) 120 mg tablet Take 3 tablets (360 mg total) by mouth daily for 1 day, THEN 1 tablet (120 mg total) daily. Take with or without food. Swallow tablets whole. Do not crush or chew tablet.. 30 tablet 0   ??? [START ON 11/20/2020] relugolix (ORGOVYX) 120 mg tablet Take 1 tablet (120 mg total) by mouth daily. Take with or without food. Swallow tablets whole. Do not crush or chew tablet.. 30 tablet 11   ??? silodosin 4 mg cap Take 8 mg by mouth daily.     ??? simvastatin (ZOCOR) 20 MG tablet Take 20 mg by mouth nightly.      ??? tamsulosin (FLOMAX) 0.4 mg capsule Take 0.4 mg by mouth in the morning.     ??? traMADol (ULTRAM) 50 mg tablet Take 50 mg by mouth every six (6) hours as needed.      ??? XARELTO 20 mg tablet 20 mg daily.  No current facility-administered medications for this visit.       Allergies   Allergen Reactions   ??? Nsaids (Non-Steroidal Anti-Inflammatory Drug) Other (See Comments)     Diverticular bleed  Diverticular bleed  Diverticular bleed   ??? Tolmetin Other (See Comments)     Diverticular bleed  Diverticular bleed   ??? Venlafaxine Other (See Comments)     sedation   ??? Celecoxib Itching and Rash   ??? Diltiazem Hcl Rash       Patient Active Problem List   Diagnosis   ??? Malignant neoplasm of prostate (CMS-HCC)   ??? 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 Diverticulosis of intestine with bleeding       Reviewed and up to date in Epic.    Appropriateness of Therapy     Acute infections noted within Epic:  No active infections  Patient reported infection: None    Is medication and dose appropriate based on diagnosis and infection status? Yes    Prescription has been clinically reviewed: Yes      Baseline Quality of Life Assessment      How many days over the past month did your prostate cancer  keep you from your normal activities? For example, brushing your teeth or getting up in the morning. 0    Financial Information     Medication Assistance provided: Prior Authorization    Anticipated copay of $50 / 28 days (first fill) $50/30 days (maintenance fill) reviewed with patient. Verified delivery address.    Delivery Information     Scheduled delivery date: 11/05/20    Expected start date: ASAP    Medication will be delivered via UPS to the prescription address in [facility].  This shipment will not require a signature.      Explained the services we provide at [facility] and that each month we would call to set up refills.  Stressed importance of returning phone calls so that we could ensure they receive their medications in time each month.  Informed patient that we should be setting up refills 7-10 days prior to when they will run out of medication.  A pharmacist will reach out to perform a clinical assessment periodically.  Informed patient that a welcome packet, containing information about our pharmacy and other support services, a Notice of Privacy Practices, and a drug information handout will be sent.      The patient or caregiver noted above participated in the development of this care plan and knows that they can request review of or adjustments to the care plan at any time.      Patient or caregiver verbalized understanding of the above information as well as how to contact the pharmacy at [phone] option 4 with any questions/concerns.  The pharmacy is open Monday through Friday 8:30am-4:30pm.  A pharmacist is available 24/7 via pager to answer any clinical questions they may have.    Patient Specific Needs     - Does the patient have any physical, cognitive, or cultural barriers? No    - Does the patient have adequate living arrangements? (i.e. the ability to store and take their medication appropriately) Yes    - Did you identify any home environmental safety or security hazards? No    - Patient prefers to have medications discussed with  Patient     - Is the patient or caregiver able to read and understand education materials at a high  school level or above? Yes    - Patient's primary language is  English     - Is the patient high risk? No    - Does the patient require physician intervention or other additional services (i.e. dietary/nutrition, smoking cessation, social work)? No      Treylen Gibbs A Shari Heritage [facility] Specialty Pharmacist

## 2020-11-16 MED ORDER — ONDANSETRON 4 MG DISINTEGRATING TABLET
Freq: Three times a day (TID) | ORAL | 0 days | PRN
Start: 2020-11-16 — End: ?

## 2020-11-16 MED ORDER — MECLIZINE 12.5 MG TABLET
ORAL | 0 days
Start: 2020-11-16 — End: ?

## 2020-11-20 MED ORDER — RELUGOLIX 120 MG TABLET
ORAL_TABLET | Freq: Every day | ORAL | 11 refills | 30.00000 days | Status: CP
Start: 2020-11-20 — End: ?
  Filled 2020-11-29: qty 30, 30d supply, fill #0

## 2020-11-23 NOTE — Unmapped (Signed)
[facility] Clinical Assessment & Refill Coordination Note    Douglas Becker, DOB: 1941/05/05  Phone: [phone] (home)     All above HIPAA information was verified with patient.     Was a Nurse, learning disability used for this call? No    Specialty Medication(s):   Hematology/Oncology: Orgovyx     Current Outpatient Medications   Medication Sig Dispense Refill   ??? acetaminophen (TYLENOL) 500 MG tablet Take 500 mg by mouth every six (6) hours as needed.      ??? apixaban (ELIQUIS) 5 mg Tab Take 5 mg by mouth Two (2) times a day. (Patient not taking: Reported on 11/04/2020)     ??? clindamycin (CLEOCIN T) 1 % external solution once as needed.     ??? cyanocobalamin 1000 MCG tablet Take 1,000 mcg by mouth daily.     ??? furosemide (LASIX) 20 MG tablet 20 mg  in the morning.     ??? gabapentin (NEURONTIN) 600 MG tablet Take 600 mg by mouth Four (4) times a day with a meal and nightly.      ??? metoprolol tartrate (LOPRESSOR) 50 MG tablet Take 12.5 mg by mouth daily. Take 12.5mg  (1/2 tablet) daily     ??? omeprazole (PRILOSEC) 40 MG capsule Take 1 capsule by mouth once daily. (Patient not taking: Reported on 11/04/2020)     ??? relugolix (ORGOVYX) 120 mg tablet Take 3 tablets (360 mg total) by mouth daily for 1 day, THEN 1 tablet (120 mg total) daily. Take with or without food. Swallow tablets whole. Do not crush or chew tablet.. 30 tablet 0   ??? relugolix (ORGOVYX) 120 mg tablet Take 1 tablet (120 mg total) by mouth daily. Take with or without food. Swallow tablets whole. Do not crush or chew tablet.. 30 tablet 11   ??? silodosin 4 mg cap Take 8 mg by mouth daily. (Patient not taking: Reported on 11/04/2020)     ??? simvastatin (ZOCOR) 20 MG tablet Take 20 mg by mouth nightly.      ??? tamsulosin (FLOMAX) 0.4 mg capsule Take 0.4 mg by mouth in the morning. (Patient not taking: Reported on 11/04/2020)     ??? traMADol (ULTRAM) 50 mg tablet Take 50 mg by mouth every six (6) hours as needed.      ??? XARELTO 20 mg tablet 20 mg daily.       No current facility-administered medications for this visit.        Changes to medications: Demitrius reports no changes at this time.    Allergies   Allergen Reactions   ??? Nsaids (Non-Steroidal Anti-Inflammatory Drug) Other (See Comments)     Diverticular bleed  Diverticular bleed  Diverticular bleed   ??? Tolmetin Other (See Comments)     Diverticular bleed  Diverticular bleed   ??? Venlafaxine Other (See Comments)     sedation   ??? Celecoxib Itching and Rash   ??? Diltiazem Hcl Rash       Changes to allergies: No    SPECIALTY MEDICATION ADHERENCE     Relugolix 120 mg: 10 days of medicine on hand     Medication Adherence    Patient reported X missed doses in the last month: 0  Specialty Medication: Orgovyx 120 mg daily  Patient is on additional specialty medications: No  Informant: patient  Confirmed plan for next specialty medication refill: delivery by pharmacy  Refills needed for supportive medications: not needed  Specialty medication(s) dose(s) confirmed: Regimen is correct and unchanged.     Are there any concerns with adherence? No    Adherence counseling provided? Not needed    CLINICAL MANAGEMENT AND INTERVENTION      Clinical Benefit Assessment:    Do you feel the medicine is effective or helping your condition? Yes    Clinical Benefit counseling provided? Not needed    Adverse Effects Assessment:    Are you experiencing any side effects? Yes, patient reports experiencing hot flashes. Side effect counseling provided: use fan, increase air conditioning, light blankets, drink cool water.  Report to provider if hot flashes affect daily activities of living    Are you experiencing difficulty administering your medicine? No    Quality of Life Assessment:    Quality of Life    Rheumatology  On a scale of 1 - 10 with 1 representing not at all and 10 representing completely - how has your rheumatologic condition affected your:  Oncology  1.On a scale of 1-10, rate your pain and/or discomfort you've experienced within the last week.: 1  2.On a scale of 1-10, how has cancer symptoms and/or treatment side effects interfered with your ability to complete your normal daily activities within the last week?: 1  3.Within the last week, how would you rate your feeling of anxiousness and/or depression based on the following options?: None (1)  Dermatology          How many days over the past month did your Prostate cancer  keep you from your normal activities? For example, brushing your teeth or getting up in the morning. 0    Have you discussed this with your provider? Not needed    Acute Infection Status:    Acute infections noted within Epic:  No active infections  Patient reported infection: None    Therapy Appropriateness:    Is therapy appropriate? Yes, therapy is appropriate and should be continued    DISEASE/MEDICATION-SPECIFIC INFORMATION      N/A    PATIENT SPECIFIC NEEDS     - Does the patient have any physical, cognitive, or cultural barriers? No    - Is the patient high risk? No    - Does the patient require a Care Management Plan? No     - Does the patient require physician intervention or other additional services (i.e. nutrition, smoking cessation, social work)? No      SHIPPING     Specialty Medication(s) to be Shipped:   Hematology/Oncology: Orgovyx    Other medication(s) to be shipped: No additional medications requested for fill at this time     Changes to insurance: No    Delivery Scheduled: Yes, Expected medication delivery date: 11/30/20.     Medication will be delivered via UPS to the confirmed prescription address in [facility].    The patient will receive a drug information handout for each medication shipped and additional FDA Medication Guides as required.  Verified that patient has previously received a Conservation officer, historic buildings and a Surveyor, mining.    The patient or caregiver noted above participated in the development of this care plan and knows that they can request review of or adjustments to the care plan at any time.      All of the patient's questions and concerns have been addressed.    Breck Coons [facility] Specialty Pharmacist

## 2020-12-17 NOTE — Unmapped (Signed)
[facility] Refill Coordination Note    Specialty Medication(s) to be Shipped:   Hematology/Oncology: Orgovyx    Other medication(s) to be shipped: No additional medications requested for fill at this time     Douglas Becker, DOB: 1940-12-06  Phone: [phone] (home)       All above HIPAA information was verified with patient.     Was a Nurse, learning disability used for this call? No    Completed refill call assessment today to schedule patient's medication shipment from the [facility] [phone]).  All relevant notes have been reviewed.     Specialty medication(s) and dose(s) confirmed: Regimen is correct and unchanged.   Changes to medications: Douglas Becker reports no changes at this time.  Changes to insurance: No  New side effects reported not previously addressed with a pharmacist or physician: None reported  Questions for the pharmacist: No    Confirmed patient received a Conservation officer, historic buildings and a Surveyor, mining with first shipment. The patient will receive a drug information handout for each medication shipped and additional FDA Medication Guides as required.       DISEASE/MEDICATION-SPECIFIC INFORMATION        N/A    SPECIALTY MEDICATION ADHERENCE     Medication Adherence    Patient reported X missed doses in the last month: 0  Specialty Medication: Orgovyx 120 mg  Patient is on additional specialty medications: No  Informant: patient              Were doses missed due to medication being on hold? No    Orgovyx 120 mg: 14 days of medicine on hand       REFERRAL TO PHARMACIST     Referral to the pharmacist: Not needed      [facility]     Shipping address confirmed in Epic.     Delivery Scheduled: Yes, Expected medication delivery date: 12/29/20.     Medication will be delivered via UPS to the prescription address in Epic [place].    Douglas Becker Douglas Becker   [facility] Specialty Technician

## 2020-12-21 ENCOUNTER — Ambulatory Visit
Admit: 2020-12-21 | Discharge: 2020-12-22 | Payer: MEDICARE | Attending: Hematology & Oncology | Primary: Hematology & Oncology

## 2020-12-21 ENCOUNTER — Ambulatory Visit: Admit: 2020-12-21 | Discharge: 2020-12-22 | Payer: MEDICARE

## 2020-12-21 ENCOUNTER — Other Ambulatory Visit: Admit: 2020-12-21 | Discharge: 2020-12-22 | Payer: MEDICARE

## 2020-12-21 DIAGNOSIS — C778 Secondary and unspecified malignant neoplasm of lymph nodes of multiple regions: Principal | ICD-10-CM

## 2020-12-21 DIAGNOSIS — C61 Malignant neoplasm of prostate: Principal | ICD-10-CM

## 2020-12-21 LAB — HEPATIC FUNCTION PANEL
ALBUMIN: 4.2 g/dL (ref 3.4–5.0)
ALKALINE PHOSPHATASE: 89 U/L (ref 46–116)
ALT (SGPT): 20 U/L (ref 10–49)
AST (SGOT): 26 U/L (ref <=34)
BILIRUBIN DIRECT: 0.3 mg/dL (ref 0.00–0.30)
BILIRUBIN TOTAL: 0.9 mg/dL (ref 0.3–1.2)
PROTEIN TOTAL: 7.5 g/dL (ref 5.7–8.2)

## 2020-12-21 LAB — TESTOSTERONE: TESTOSTERONE TOTAL: 27 ng/dL — ABNORMAL LOW

## 2020-12-21 LAB — PSA: PROSTATE SPECIFIC ANTIGEN: 0.91 ng/mL (ref 0.00–4.00)

## 2020-12-21 MED ORDER — BICALUTAMIDE 50 MG TABLET
ORAL_TABLET | Freq: Every day | ORAL | 5 refills | 30 days | Status: CP
Start: 2020-12-21 — End: ?
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
## 2020-12-21 NOTE — Unmapped (Unsigned)
Peripheral stick done by Tuesday Jean Rosenthal, 23g L AC, labs drawn and sent.

## 2020-12-21 NOTE — Unmapped (Addendum)
Lab Results   Component Value Date    PSA 0.91 12/21/2020    PSA 33.60 (H) 05/16/2018    PSA 24.00 (H) 01/03/2018    PSA 19.10 (H) 08/30/2017    PSA 16.30 (H) 04/26/2017    PSA 11.90 (H) 12/26/2016      1) Finish the supply of Orgovyx, then switch treatment to Casodex, which I had prescribed.  2) Get labs including PSA done locally in 2 months  3) Return in 4 months.    Please call [phone] to reach my nurse navigator Mauricia Area for any issues.    For emergencies on Nights, Weekends and Holidays  Call [phone] and ask for the hematology/oncology on call.    Griffin Basil, MD, PhD  Associate Professor of Medicine  Division of Hematology-Oncology    [facility]  Genitourinary Oncology Clinic  Nurse Navigator: Mauricia Area  Fax: [phone]

## 2020-12-21 NOTE — Unmapped (Signed)
Hi,     [name] contacted the Communication Center requesting to speak with the care team of Douglas Becker to discuss:    States needs assistance with PSA results.    Please contact [name] at [phone].    Thank you,   [name]  [facility]   [phone]

## 2020-12-23 NOTE — Unmapped (Signed)
Douglas Becker 's Orgovyx shipment will be canceled  as a result of Provider discontinued med  Patient reached out and informed [facility] of hold on medicine as well  We will not reschedule the medication and have removed this/these medication(s) from the work request.  We have canceled this work request.

## 2020-12-31 NOTE — Unmapped (Signed)
Specialty Medication(s): Orgovyx    Douglas Becker has been dis-enrolled from the [facility] specialty pharmacy services due to a change in therapy. The patient is now taking casodex and is not filling at the [facility].    Additional information provided to the patient: NA    Maryellen Dowdle A [facility] Specialty Pharmacist

## 2021-01-31 ENCOUNTER — Other Ambulatory Visit: Payer: Self-pay | Admitting: Cardiovascular Disease

## 2021-02-15 DIAGNOSIS — C61 Malignant neoplasm of prostate: Principal | ICD-10-CM

## 2021-02-17 ENCOUNTER — Ambulatory Visit: Admit: 2021-02-17 | Discharge: 2021-02-18 | Payer: MEDICARE

## 2021-03-15 ENCOUNTER — Other Ambulatory Visit: Payer: Self-pay | Admitting: Cardiovascular Disease

## 2021-03-15 NOTE — Telephone Encounter (Signed)
Prescription refill request for Xarelto received.  Indication: Atrial fib Last office visit: 10/05/20  Johnny Bridge MD Weight: 73.1kg Age: 80 Scr: 1.00 on 11/09/20 CrCl: 62.95  Based on above findings Xarelto 20mg  daily is the appropriate dose.  Refill approved.

## 2021-04-15 ENCOUNTER — Ambulatory Visit (INDEPENDENT_AMBULATORY_CARE_PROVIDER_SITE_OTHER): Payer: Medicare Other | Admitting: Urology

## 2021-04-15 ENCOUNTER — Other Ambulatory Visit: Payer: Self-pay

## 2021-04-15 ENCOUNTER — Encounter: Payer: Self-pay | Admitting: Urology

## 2021-04-15 VITALS — BP 144/69 | HR 68 | Ht 71.0 in | Wt 158.0 lb

## 2021-04-15 DIAGNOSIS — C61 Malignant neoplasm of prostate: Secondary | ICD-10-CM

## 2021-04-15 DIAGNOSIS — R310 Gross hematuria: Secondary | ICD-10-CM | POA: Diagnosis not present

## 2021-04-15 NOTE — Progress Notes (Signed)
04/15/2021 10:22 AM   Ryan Allison 1941-01-04 [id number]  Referring provider: Adin Hector, MD [facility] [place],  [place] [address]  Chief Complaint  Patient presents with   Follow-up    Urologic history:   1.  Lower urinary tract symptoms Obstructive voiding symptoms Orthostatic changes with tamsulosin; no improvement on still it is Cystoscopy 03/11/2020 with moderate lateral lobe enlargement with prominent hypervascularity   2.  Recurrent gross hematuria Cystoscopy as above CT 01/2019 with right nephrolithiasis   3.  Intermediate risk prostate cancer Followed [facility] oncology Initially treated EBRT + ADT 2011 Nodal mets via PET Axumin 2018 Started bicalutamide monotherapy with PSA 33.6; decreased to 0.3 on bicalutamide however this was stopped secondary to questionable side effects 2021 Currently monitored at [facility]   HPI: 80 y.o. male presents for follow-up.  Refer to my previous note 10/13/2020 He has continued to have intermittent gross hematuria.  It typically is translucent.  He may have had 1 small clot but no thick bleeding or clot retention Saw medical oncology at [facility] 12/21/2020.  Was to finish relugolix then start bicalutamide PSA 12/21/2020 significantly decreased at 0.91 Stable LUTS   PMH: Past Medical History:  Diagnosis Date   Aortic atherosclerosis ([place])    Arthritis    Atrial flutter ([place])    a. diagnosed 05/2018; b. not on [place] 2/2 recurrent life threatening GI bleed; c. CHADS2VASc => 3 (age x 2, vascular disease)   Cancer ([place]) 2011   prostate   GI bleed    History of echocardiogram    a. TTE 05/2018: EF of 60-65%, normal wall motion, mild MR, left atrium normal in size, RVSF normal, PASP 33 mmHg, rhythm was atrial flutter   History of GI diverticular bleed    extended stay in the [facility] ICU due to GI bleeding prior to 2016   HLD (hyperlipidemia)    Hyperlipidemia    Hypertension    Meniscus tear    OSA on CPAP     Peripheral neuropathy     Surgical History: Past Surgical History:  Procedure Laterality Date   COLONOSCOPY Left 06/07/2018   Procedure: COLONOSCOPY;  Surgeon: Virgel Manifold, MD;  Location: [facility] ENDOSCOPY;  Service: Endoscopy;  Laterality: Left;   COLONOSCOPY WITH PROPOFOL N/A 04/06/2015   Procedure: COLONOSCOPY WITH PROPOFOL;  Surgeon: Lollie Sails, MD;  Location: [facility] ENDOSCOPY;  Service: Endoscopy;  Laterality: N/A;   COLONOSCOPY WITH PROPOFOL N/A 06/07/2018   Procedure: COLONOSCOPY WITH PROPOFOL;  Surgeon: Virgel Manifold, MD;  Location: [facility] ENDOSCOPY;  Service: Endoscopy;  Laterality: N/A;   HERNIA REPAIR Left 09-23-14   inguinal   INGUINAL HERNIA REPAIR Left 09/23/2014   Procedure: HERNIA REPAIR INGUINAL ADULT;  Surgeon: Christene Lye, MD;  Location: [facility] ORS;  Service: General;  Laterality: Left;   MENISCUS REPAIR  2008   VASCULAR SURGERY  2015    Home Medications:  Allergies as of 04/15/2021       Reactions   Nsaids Other (See Comments)   Diverticular bleed Diverticular bleed   Tolmetin    Other reaction(s): Other (See Comments) Diverticular bleed Diverticular bleed   Celecoxib Itching, Rash   Diltiazem Hcl Rash   Other Other (See Comments)   Diverticular bleed   Venlafaxine Other (See Comments)   sedation sedation        Medication List        Accurate as of April 15, 2021 10:22 AM. If you have  any questions, ask your nurse or doctor.          STOP taking these medications    alfuzosin 10 MG 24 hr tablet Commonly known as: UROXATRAL Stopped by: Abbie Sons, MD       TAKE these medications    acetaminophen 500 MG tablet Commonly known as: TYLENOL Take 500 mg by mouth daily as needed for fever or pain.   bicalutamide 50 MG tablet Commonly known as: CASODEX Take 50 mg by mouth daily.   cyanocobalamin 1000 MCG tablet Take 1,000 mcg by mouth daily.   furosemide 20 MG tablet Commonly known as: LASIX TAKE 1  TABLET DAILY MONDAY THROUGH FRIDAY. DO NOT TAKE ON SATURDAY AND SUNDAY   gabapentin 600 MG tablet Commonly known as: NEURONTIN Take 600 mg by mouth 5 (five) times daily.   metoprolol tartrate 25 MG tablet Commonly known as: LOPRESSOR Take 0.5 tablets (12.5 mg total) by mouth daily.   Orgovyx 120 MG Tabs Generic drug: Relugolix Take 1 tablet by mouth daily as needed.   simvastatin 20 MG tablet Commonly known as: ZOCOR Take 20 mg by mouth at bedtime.   traMADol 50 MG tablet Commonly known as: ULTRAM Take 50 mg by mouth every 6 (six) hours as needed for moderate pain.   Xarelto 20 MG Tabs tablet Generic drug: rivaroxaban TAKE 1 TABLET DAILY WITH SUPPER        Allergies:  Allergies  Allergen Reactions   Nsaids Other (See Comments)    Diverticular bleed Diverticular bleed   Tolmetin     Other reaction(s): Other (See Comments) Diverticular bleed Diverticular bleed   Celecoxib Itching and Rash   Diltiazem Hcl Rash   Other Other (See Comments)    Diverticular bleed   Venlafaxine Other (See Comments)    sedation sedation    Family History: Family History  Problem Relation Age of Onset   Dementia Mother    Cancer Father    Cancer Brother     Social History:  reports that he quit smoking about 51 years ago. His smoking use included cigarettes. He has never used smokeless tobacco. He reports that he does not drink alcohol and does not use drugs.   Physical Exam: BP (!) 144/69 (BP Location: Left Arm, Patient Position: Sitting, Cuff Size: Normal)   Pulse 68   Ht 5\' 11"  (1.803 m)   Wt 158 lb (71.7 kg)   BMI 22.04 kg/m   Constitutional:  Alert and oriented, No acute distress. HEENT: [place] AT, moist mucus membranes.  Trachea midline, no masses. Cardiovascular: No clubbing, cyanosis, or edema. Respiratory: Normal respiratory effort, no increased work of breathing. Psychiatric: Normal mood and affect.   Assessment & Plan:    1.  Gross hematuria Previous  evaluation suggested prostatic bleeding He is on chronic anticoagulation with Xarelto Since he has had continued gross hematuria I recommended scheduling follow-up CT urogram and cystoscopy He elected to hold off and monitor and stated he would call back if his hematuria worsens Follow-up 6 months and he will call earlier for any persistent or worsening hematuria  2. Prostate cancer PSA improved   Abbie Sons, MD  [facility] [address], [place], [place] [address] [phone]

## 2021-04-17 ENCOUNTER — Encounter: Payer: Self-pay | Admitting: Urology

## 2021-04-26 ENCOUNTER — Ambulatory Visit
Admit: 2021-04-26 | Discharge: 2021-04-27 | Payer: MEDICARE | Attending: Hematology & Oncology | Primary: Hematology & Oncology

## 2021-04-26 ENCOUNTER — Other Ambulatory Visit: Admit: 2021-04-26 | Discharge: 2021-04-27 | Payer: MEDICARE

## 2021-04-26 DIAGNOSIS — C61 Malignant neoplasm of prostate: Principal | ICD-10-CM

## 2021-05-17 DIAGNOSIS — C61 Malignant neoplasm of prostate: Principal | ICD-10-CM

## 2021-05-17 DIAGNOSIS — K625 Hemorrhage of anus and rectum: Principal | ICD-10-CM

## 2021-05-17 DIAGNOSIS — K5791 Diverticulosis of intestine, part unspecified, without perforation or abscess with bleeding: Principal | ICD-10-CM

## 2021-05-17 DIAGNOSIS — K9419 Other complications of enterostomy: Principal | ICD-10-CM

## 2021-06-25 ENCOUNTER — Emergency Department
Admission: EM | Admit: 2021-06-25 | Discharge: 2021-06-25 | Disposition: A | Discharge status: Home / Self Care | Payer: Medicare Other | Attending: Emergency Medicine | Admitting: Emergency Medicine

## 2021-06-25 ENCOUNTER — Encounter: Payer: Self-pay | Admitting: Emergency Medicine

## 2021-06-25 ENCOUNTER — Other Ambulatory Visit: Payer: Self-pay

## 2021-06-25 ENCOUNTER — Emergency Department: Payer: Medicare Other

## 2021-06-25 DIAGNOSIS — B9689 Other specified bacterial agents as the cause of diseases classified elsewhere: Secondary | ICD-10-CM | POA: Diagnosis not present

## 2021-06-25 DIAGNOSIS — U071 COVID-19: Secondary | ICD-10-CM | POA: Insufficient documentation

## 2021-06-25 DIAGNOSIS — I10 Essential (primary) hypertension: Secondary | ICD-10-CM | POA: Diagnosis not present

## 2021-06-25 DIAGNOSIS — N390 Urinary tract infection, site not specified: Secondary | ICD-10-CM | POA: Diagnosis not present

## 2021-06-25 DIAGNOSIS — Z8546 Personal history of malignant neoplasm of prostate: Secondary | ICD-10-CM | POA: Diagnosis not present

## 2021-06-25 DIAGNOSIS — M791 Myalgia, unspecified site: Secondary | ICD-10-CM | POA: Diagnosis present

## 2021-06-25 LAB — TROPONIN I (HIGH SENSITIVITY)
Troponin I (High Sensitivity): 20 ng/L — ABNORMAL HIGH (ref <18)
Troponin I (High Sensitivity): 20 ng/L — ABNORMAL HIGH (ref <18)

## 2021-06-25 LAB — BASIC METABOLIC PANEL
Anion gap: 14 (ref 5–15)
BUN: 25 mg/dL — ABNORMAL HIGH (ref 8–23)
CO2: 25 mmol/L (ref 22–32)
Calcium: 9.4 mg/dL (ref 8.9–10.3)
Chloride: 95 mmol/L — ABNORMAL LOW (ref 98–111)
Creatinine, Ser: 1.02 mg/dL (ref 0.61–1.24)
GFR, Estimated: >60 mL/min (ref >60)
Glucose, Bld: 106 mg/dL — ABNORMAL HIGH (ref 70–99)
Potassium: 3.7 mmol/L (ref 3.5–5.1)
Sodium: 134 mmol/L — ABNORMAL LOW (ref 135–145)

## 2021-06-25 LAB — CBC
HCT: 44 % (ref 39.0–52.0)
Hemoglobin: 14.7 g/dL (ref 13.0–17.0)
MCH: 32.9 pg (ref 26.0–34.0)
MCHC: 33.4 g/dL (ref 30.0–36.0)
MCV: 98.4 fL (ref 80.0–100.0)
Platelets: 173 10*3/uL (ref 150–400)
RBC: 4.47 MIL/uL (ref 4.22–5.81)
RDW: 12 % (ref 11.5–15.5)
WBC: 6.6 10*3/uL (ref 4.0–10.5)
nRBC: 0 % (ref 0.0–0.2)

## 2021-06-25 LAB — URINALYSIS, ROUTINE W REFLEX MICROSCOPIC
Bilirubin Urine: NEGATIVE
Glucose, UA: NEGATIVE mg/dL
Ketones, ur: 80 mg/dL — AB
Leukocytes,Ua: NEGATIVE
Nitrite: NEGATIVE
Protein, ur: 100 mg/dL — AB
Specific Gravity, Urine: 1.025 (ref 1.005–1.030)
pH: 5 (ref 5.0–8.0)

## 2021-06-25 LAB — HEPATIC FUNCTION PANEL
ALT: 14 U/L (ref 0–44)
AST: 25 U/L (ref 15–41)
Albumin: 4.3 g/dL (ref 3.5–5.0)
Alkaline Phosphatase: 71 U/L (ref 38–126)
Bilirubin, Direct: 0.2 mg/dL (ref 0.0–0.2)
Indirect Bilirubin: 0.7 mg/dL (ref 0.3–0.9)
Total Bilirubin: 0.9 mg/dL (ref 0.3–1.2)
Total Protein: 8.3 g/dL — ABNORMAL HIGH (ref 6.5–8.1)

## 2021-06-25 LAB — RESP PANEL BY RT-PCR (FLU A&B, COVID) ARPGX2
Influenza A by PCR: NEGATIVE
Influenza B by PCR: NEGATIVE
SARS Coronavirus 2 by RT PCR: POSITIVE — AB

## 2021-06-25 LAB — CK: Total CK: 184 U/L (ref 49–397)

## 2021-06-25 MED ORDER — SODIUM CHLORIDE 0.9 % IV BOLUS
1000.0000 mL | Freq: Once | INTRAVENOUS | Status: AC
  Administered 2021-06-25: 1000 mL via INTRAVENOUS

## 2021-06-25 MED ORDER — ACETAMINOPHEN 500 MG PO TABS
1000.0000 mg | ORAL_TABLET | Freq: Once | ORAL | Status: AC
  Administered 2021-06-25: 1000 mg via ORAL
  Filled 2021-06-25: qty 2

## 2021-06-25 MED ORDER — OXYCODONE HCL 5 MG PO TABS
5.0000 mg | ORAL_TABLET | Freq: Once | ORAL | Status: AC
  Administered 2021-06-25: 5 mg via ORAL
  Filled 2021-06-25: qty 1

## 2021-06-25 MED ORDER — CEPHALEXIN 500 MG PO CAPS: 500.0000 mg | ORAL_CAPSULE | Freq: Four times a day (QID) | ORAL | 0 refills | Status: AC

## 2021-06-25 MED ORDER — CEPHALEXIN 500 MG PO CAPS
500.0000 mg | ORAL_CAPSULE | Freq: Once | ORAL | Status: AC
  Administered 2021-06-25: 500 mg via ORAL
  Filled 2021-06-25: qty 1

## 2021-06-25 NOTE — ED Provider Notes (Addendum)
[facility] Provider Note    Event Date/Time   First MD Initiated Contact with Patient 06/25/21 1125     (approximate)   History   Weakness   HPI  AUDI Ryan Allison is a 81 y.o. male past medical history of atrial flutter, hyperlipidemia, hypertension, peripheral neuropathy who presents with body aches.  Symptoms of been going on for about 1 week.  He endorses diffuse body pain.  Did initially have a headache which has subsided significantly.  He has had poor p.o. intake, has an ileostomy and has had increased loose output denies any black stool.  No nausea or vomiting or abdominal pain.  Denies dysuria fevers chills shortness of breath or chest pain.  Patient is accompanied by his neighbor today.  He lives alone.  Has had difficulty getting around due to the body aches.    Past Medical History:  Diagnosis Date   Aortic atherosclerosis ([place])    Arthritis    Atrial flutter ([place])    a. diagnosed 05/2018; b. not on [place] 2/2 recurrent life threatening GI bleed; c. CHADS2VASc => 3 (age x 2, vascular disease)   Cancer ([place]) 2011   prostate   GI bleed    History of echocardiogram    a. TTE 05/2018: EF of 60-65%, normal wall motion, mild MR, left atrium normal in size, RVSF normal, PASP 33 mmHg, rhythm was atrial flutter   History of GI diverticular bleed    extended stay in the [facility] ICU due to GI bleeding prior to 2016   HLD (hyperlipidemia)    Hyperlipidemia    Hypertension    Meniscus tear    OSA on CPAP    Peripheral neuropathy     Patient Active Problem List   Diagnosis Date Noted   Gross hematuria 03/19/2019   Nephrolithiasis 03/19/2019   Atrial flutter ([place]) 09/30/2018   OSA on CPAP 09/30/2018   Acute blood loss anemia 09/30/2018   Encounter for anticoagulation discussion and counseling 09/30/2018   Small bowel obstruction ([place]) 08/05/2018   Hematochezia    Internal hemorrhoids    Ulcer of anus and rectum    Diverticulosis of large intestine  without diverticulitis    B12 deficiency 01/30/2017   Diplopia 01/30/2017   Diverticulosis 01/30/2017   Hyperlipidemia 01/30/2017   Kyphoscoliosis 01/30/2017   Malignant neoplasm of prostate ([place]) 01/30/2017   Osteopenia 01/30/2017   Peripheral polyneuropathy 01/30/2017   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   Status post eye surgery 07/17/2016   Acute gastrointestinal hemorrhage 05/06/2016   Cancer of prostate ([place]) 04/03/2016   Aortic atherosclerosis ([place]) 10/21/2015   DDD (degenerative disc disease), lumbar 10/21/2015   GERD (gastroesophageal reflux disease) 01/20/2014   Esotropia 03/12/2012   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 --      Excl. in Ord? --     Most recent vital signs: Vitals:   06/25/21 1400 06/25/21 1430  BP: (!) 150/83 137/81  Pulse: 77 79  Resp: 14 14  Temp:    SpO2: 100% 95%     General: Awake, no distress.  Appears chronically ill CV:  Good peripheral perfusion.  No lower extremity edema Resp:  Normal effort.  Abd:  No distention.  Soft, nontender, ileostomy with green loose output Neuro:             Awake, Alert, Oriented x 3  Other:  Moist mucous membranes   ED Results / Procedures / Treatments  Labs (all labs ordered are listed, but only abnormal results are displayed) Labs Reviewed  RESP PANEL BY RT-PCR (FLU A&B, COVID) ARPGX2  - Abnormal; Notable for the following components:      Result Value   SARS Coronavirus 2 by RT PCR POSITIVE (*)    All other components within normal limits  BASIC METABOLIC PANEL - Abnormal; Notable for the following components:   Sodium 134 (*)    Chloride 95 (*)    Glucose, Bld 106 (*)    BUN 25 (*)    All other components within normal limits  URINALYSIS, ROUTINE W REFLEX MICROSCOPIC - Abnormal; Notable for the following components:   Color, Urine YELLOW (*)    APPearance HAZY (*)    Hgb urine dipstick SMALL (*)    Ketones, ur 80 (*)    Protein, ur 100 (*)    Bacteria, UA RARE (*)    All other components within normal limits  HEPATIC FUNCTION PANEL - Abnormal; Notable for the following components:   Total Protein 8.3 (*)    All other components within normal limits  TROPONIN I (HIGH SENSITIVITY) - Abnormal; Notable for the following components:   Troponin I (High Sensitivity) 20 (*)    All other components within normal limits  TROPONIN I (HIGH SENSITIVITY) - Abnormal; Notable for the following components:   Troponin I (High Sensitivity) 20 (*)    All other components within normal limits  URINE CULTURE  CBC  CK  CBG MONITORING, ED     EKG  EKG interpreted by myself, atrial flutter with variable block, right bundle branch block, no acute ischemic changes   RADIOLOGY I reviewed the CXR which does not show any acute cardiopulmonary process   PROCEDURES:  Critical Care performed: No  .1-3 Lead EKG Interpretation Performed by: Rada Hay, MD Authorized by: Rada Hay, MD     Interpretation: normal     ECG rate assessment: normal     Rhythm: sinus rhythm     Ectopy: none     Conduction: normal    The patient is on the cardiac monitor to evaluate for evidence of arrhythmia and/or significant heart rate changes.   MEDICATIONS ORDERED IN ED: Medications  cephALEXin (KEFLEX) capsule 500 mg (has no administration in time range)  sodium chloride  0.9 % bolus 1,000 mL (0 mLs Intravenous Stopped 06/25/21 1244)  acetaminophen (TYLENOL) tablet 1,000 mg (1,000 mg Oral Given 06/25/21 1229)  oxyCODONE (Oxy IR/ROXICODONE) immediate release tablet 5 mg (5 mg Oral Given 06/25/21 1338)  sodium chloride 0.9 % bolus 1,000 mL (1,000 mLs Intravenous  New Bag/Given 06/25/21 1457)     IMPRESSION / MDM / ASSESSMENT AND PLAN / ED COURSE  I reviewed the triage vital signs and the nursing notes.                              Differential diagnosis includes, but is not limited to, viral syndrome, dehydration, renal failure, metabolic abnormality, rhabdomyolysis, ACS  Patient is a 81 year old male presents with diffuse myalgias and generalized malaise.  His vital signs are reassuring, he is not hypoxic and not febrile.  Overall he appears chronically ill but not acutely.  His abdomen is nontender, no focal tenderness in his extremities.  I reviewed his labs, CBC, CMP reassuring.  Troponins are 20 and on repeat 20 again and he has no chest pain and EKG without ischemic changes.  I suspected viral syndrome and he is indeed COVID-positive which I think explains his symptoms.  Patient given a liter of fluids given he has not been eating well.  Attempted in and out cath but was unable to pass the prostate patient has a history of prostate cancer.  He does not want to be cathed again.  Currently does not feel he needs to urinate so we will give him another liter of fluids.  I advised the nurse that if he still unable to urinate after second liter he will need to have a bladder scan to make sure he is not retaining.  The time of signout he is pending his UA.  Discussed with the patient discharge and he does feel that he be able to manage this at home.  He has a neighbor who be able to look out for him.  UA somewhat borderline.  Will treat given could be contributing to his symptoms.  5 days of Keflex.  Patient is stable for discharge. Clinical Course as of 06/25/21 1530   Sat Jun 25, 2021  2010 Basic metabolic panel(!) [KM]    Clinical Course User Index [KM] Rada Hay, MD     FINAL CLINICAL IMPRESSION(S) / ED DIAGNOSES   Final diagnoses:  COVID-19  Urinary tract infection without hematuria, site unspecified     Rx / DC Orders   ED Discharge Orders          Ordered    cephALEXin (KEFLEX) 500 MG capsule  4 times daily        06/25/21 1529             Note:  This document was prepared using Dragon voice recognition software and may include unintentional dictation errors.   Rada Hay, MD 06/25/21 1508    Rada Hay, MD 06/25/21 1530

## 2021-06-25 NOTE — ED Notes (Signed)
Pt AOX4, NAD noted. Pt c/o generalized body aches. Respirations evan and unlabored at this time.

## 2021-06-25 NOTE — ED Notes (Signed)
Ileostomy emptied

## 2021-06-25 NOTE — ED Notes (Signed)
Attempted in and out catheter without success. EDP notified.

## 2021-06-25 NOTE — ED Triage Notes (Signed)
Pt via POV from home. Pt c/o weakness and hurting all over. Pt states "I feel like he burning all over." Family states that he has been like this for about a week. Pt does have a ileostomy bag. Pt is A&OX4 and NAD.

## 2021-06-25 NOTE — ED Notes (Signed)
Second troponin/light green top sent to lab at this time

## 2021-06-25 NOTE — Discharge Instructions (Addendum)
You have COVID-19 which is likely the cause of your body aches and generalized weakness.  He also may have a urinary tract infection.  Please take the antibiotics 4 times a day for the next 5 days.  If you are not able to eat or drink or developing shortness of breath or overall worsening at home, please return to the emergency department.

## 2021-06-25 NOTE — ED Notes (Addendum)
Pt given additional blanket

## 2021-06-27 LAB — URINE CULTURE: Culture: NO GROWTH

## 2021-07-21 DIAGNOSIS — C61 Malignant neoplasm of prostate: Principal | ICD-10-CM

## 2021-07-26 ENCOUNTER — Telehealth
Admit: 2021-07-26 | Discharge: 2021-07-27 | Payer: MEDICARE | Attending: Hematology & Oncology | Primary: Hematology & Oncology

## 2021-07-26 DIAGNOSIS — C61 Malignant neoplasm of prostate: Principal | ICD-10-CM

## 2021-07-26 DIAGNOSIS — C778 Secondary and unspecified malignant neoplasm of lymph nodes of multiple regions: Principal | ICD-10-CM

## 2021-07-27 DIAGNOSIS — C61 Malignant neoplasm of prostate: Principal | ICD-10-CM

## 2021-08-31 ENCOUNTER — Other Ambulatory Visit: Payer: Self-pay | Admitting: Cardiovascular Disease

## 2021-09-13 ENCOUNTER — Telehealth
Admit: 2021-09-13 | Discharge: 2021-09-14 | Payer: MEDICARE | Attending: Hematology & Oncology | Primary: Hematology & Oncology

## 2021-09-13 DIAGNOSIS — C778 Secondary and unspecified malignant neoplasm of lymph nodes of multiple regions: Principal | ICD-10-CM

## 2021-09-13 DIAGNOSIS — C61 Malignant neoplasm of prostate: Principal | ICD-10-CM

## 2021-09-29 ENCOUNTER — Institutional Professional Consult (permissible substitution): Admit: 2021-09-29 | Discharge: 2021-09-29 | Payer: MEDICARE

## 2021-09-29 ENCOUNTER — Ambulatory Visit: Admit: 2021-09-29 | Discharge: 2021-09-29 | Payer: MEDICARE

## 2021-09-29 DIAGNOSIS — K625 Hemorrhage of anus and rectum: Principal | ICD-10-CM

## 2021-09-29 DIAGNOSIS — K5791 Diverticulosis of intestine, part unspecified, without perforation or abscess with bleeding: Principal | ICD-10-CM

## 2021-09-29 DIAGNOSIS — C61 Malignant neoplasm of prostate: Principal | ICD-10-CM

## 2021-09-29 DIAGNOSIS — K9419 Other complications of enterostomy: Principal | ICD-10-CM

## 2021-10-02 DIAGNOSIS — R1319 Other dysphagia: Principal | ICD-10-CM

## 2021-10-02 DIAGNOSIS — R198 Other specified symptoms and signs involving the digestive system and abdomen: Principal | ICD-10-CM

## 2021-10-02 DIAGNOSIS — K9419 Other complications of enterostomy: Principal | ICD-10-CM

## 2021-10-02 DIAGNOSIS — R159 Full incontinence of feces: Principal | ICD-10-CM

## 2021-10-14 ENCOUNTER — Encounter: Payer: Self-pay | Admitting: Urology

## 2021-10-14 ENCOUNTER — Ambulatory Visit (INDEPENDENT_AMBULATORY_CARE_PROVIDER_SITE_OTHER): Payer: Medicare Other | Admitting: Urology

## 2021-10-14 VITALS — BP 126/77 | HR 92 | Ht 70.0 in | Wt 156.0 lb

## 2021-10-14 DIAGNOSIS — Z8546 Personal history of malignant neoplasm of prostate: Secondary | ICD-10-CM | POA: Diagnosis not present

## 2021-10-14 DIAGNOSIS — R310 Gross hematuria: Secondary | ICD-10-CM

## 2021-10-14 DIAGNOSIS — C61 Malignant neoplasm of prostate: Secondary | ICD-10-CM

## 2021-10-14 LAB — URINALYSIS, COMPLETE
Bilirubin, UA: NEGATIVE
Glucose, UA: NEGATIVE
Ketones, UA: NEGATIVE
Leukocytes,UA: NEGATIVE
Nitrite, UA: NEGATIVE
Specific Gravity, UA: 1.03 (ref 1.005–1.030)
Urobilinogen, Ur: 0.2 mg/dL (ref 0.2–1.0)
pH, UA: 5.5 (ref 5.0–7.5)

## 2021-10-14 LAB — MICROSCOPIC EXAMINATION: RBC, Urine: >30 /hpf — AB (ref 0–2)

## 2021-10-14 NOTE — Progress Notes (Signed)
10/14/2021 10:50 AM   Ryan Allison 09/21/1940 [id number]  Referring provider: Adin Hector, MD [facility] [place],  [place] [address]  Chief Complaint  Patient presents with   Hematuria    Urologic history:   1.  Lower urinary tract symptoms Obstructive voiding symptoms Orthostatic changes with tamsulosin; no improvement silodosin Cystoscopy 03/11/2020 with moderate lateral lobe enlargement with prominent hypervascularity   2.  Recurrent gross hematuria Cystoscopy as above CT 01/2019 with right nephrolithiasis   3.  Intermediate risk prostate cancer Followed [facility] oncology Initially treated EBRT + ADT 2011 Nodal mets via PET Axumin 2018 Started bicalutamide monotherapy with PSA 33.6; decreased to 0.3 on bicalutamide however this was stopped secondary to questionable side effects 2021 Currently followed [facility] oncology   HPI: 81 y.o. male presents for follow-up.  Denies significant changes since last years visit Currently off bicalutamide and ADT and PSA being followed at [facility] Stable LUTS Continues with intermittent mild hematuria   PMH: Past Medical History:  Diagnosis Date   Aortic atherosclerosis ([place])    Arthritis    Atrial flutter ([place])    a. diagnosed 05/2018; b. not on [place] 2/2 recurrent life threatening GI bleed; c. CHADS2VASc => 3 (age x 2, vascular disease)   Cancer ([place]) 2011   prostate   GI bleed    History of echocardiogram    a. TTE 05/2018: EF of 60-65%, normal wall motion, mild MR, left atrium normal in size, RVSF normal, PASP 33 mmHg, rhythm was atrial flutter   History of GI diverticular bleed    extended stay in the [facility] ICU due to GI bleeding prior to 2016   HLD (hyperlipidemia)    Hyperlipidemia    Hypertension    Meniscus tear    OSA on CPAP    Peripheral neuropathy     Surgical History: Past Surgical History:  Procedure Laterality Date   COLONOSCOPY Left 06/07/2018   Procedure: COLONOSCOPY;  Surgeon:  Virgel Manifold, MD;  Location: [facility] ENDOSCOPY;  Service: Endoscopy;  Laterality: Left;   COLONOSCOPY WITH PROPOFOL N/A 04/06/2015   Procedure: COLONOSCOPY WITH PROPOFOL;  Surgeon: Lollie Sails, MD;  [facility] ENDOSCOPY;  Service: Endoscopy;  Laterality: N/A;   COLONOSCOPY WITH PROPOFOL N/A 06/07/2018   Procedure: COLONOSCOPY WITH PROPOFOL;  Surgeon: Virgel Manifold, MD;  Location: [facility] ENDOSCOPY;  Service: Endoscopy;  Laterality: N/A;   HERNIA REPAIR Left 09-23-14   inguinal   INGUINAL HERNIA REPAIR Left 09/23/2014   Procedure: HERNIA REPAIR INGUINAL ADULT;  Surgeon: Christene Lye, MD;  Location: [facility] ORS;  Service: General;  Laterality: Left;   MENISCUS REPAIR  2008   VASCULAR SURGERY  2015    Home Medications:  Allergies as of 10/14/2021       Reactions   Nsaids Other (See Comments)   Diverticular bleed Diverticular bleed   Tolmetin    Other reaction(s): Other (See Comments) Diverticular bleed Diverticular bleed   Celecoxib Itching, Rash   Diltiazem Hcl Rash   Other Other (See Comments)   Diverticular bleed   Venlafaxine Other (See Comments)   sedation sedation        Medication List        Accurate as of October 14, 2021 10:50 AM. If you have any questions, ask your nurse or doctor.          STOP taking these medications    bicalutamide 50 MG tablet Commonly known as: CASODEX Stopped by: Nicki Reaper  Mariana Arn, MD   Orgovyx 120 MG Tabs Generic drug: Relugolix Stopped by: Abbie Sons, MD       TAKE these medications    acetaminophen 500 MG tablet Commonly known as: TYLENOL Take 500 mg by mouth daily as needed for fever or pain.   cyanocobalamin 1000 MCG tablet Take 1,000 mcg by mouth daily.   furosemide 20 MG tablet Commonly known as: LASIX TAKE 1 TABLET DAILY MONDAY THROUGH FRIDAY. DO NOT TAKE ON SATURDAY AND SUNDAY   gabapentin 600 MG tablet Commonly known as: NEURONTIN Take 600 mg by mouth 5 (five) times daily.    metoprolol tartrate 25 MG tablet Commonly known as: LOPRESSOR TAKE ONE-HALF (1/2) TABLET DAILY   simvastatin 20 MG tablet Commonly known as: ZOCOR Take 20 mg by mouth at bedtime.   traMADol 50 MG tablet Commonly known as: ULTRAM Take 50 mg by mouth every 6 (six) hours as needed for moderate pain.   Xarelto 20 MG Tabs tablet Generic drug: rivaroxaban TAKE 1 TABLET DAILY WITH SUPPER        Allergies:  Allergies  Allergen Reactions   Nsaids Other (See Comments)    Diverticular bleed Diverticular bleed   Tolmetin     Other reaction(s): Other (See Comments) Diverticular bleed Diverticular bleed   Celecoxib Itching and Rash   Diltiazem Hcl Rash   Other Other (See Comments)    Diverticular bleed   Venlafaxine Other (See Comments)    sedation sedation    Family History: Family History  Problem Relation Age of Onset   Dementia Mother    Cancer Father    Cancer Brother     Social History:  reports that he quit smoking about 52 years ago. His smoking use included cigarettes. He has never used smokeless tobacco. He reports that he does not drink alcohol and does not use drugs.   Physical Exam: BP 126/77   Pulse 92   Ht '5\' 10"'$  (1.778 m)   Wt 156 lb (70.8 kg)   BMI 22.38 kg/m   Constitutional:  Alert and oriented, No acute distress. HEENT: [place] AT Respiratory: Normal respiratory effort, no increased work of breathing. Psychiatric: Normal mood and affect.   Assessment & Plan:    1.  Gross hematuria Previous evaluation suggested prostatic bleeding He is on chronic anticoagulation with Xarelto Follow-up CT urogram and cystoscopy has been recommended however he has declined  2. Prostate cancer Regular follow-up scheduled [place], MD  [facility] [address] [place], [place] [address] [phone]

## 2021-10-16 ENCOUNTER — Encounter: Payer: Self-pay | Admitting: Urology

## 2021-10-18 ENCOUNTER — Ambulatory Visit: Admit: 2021-10-18 | Discharge: 2021-10-19 | Payer: MEDICARE | Attending: Registered" | Primary: Registered"

## 2021-11-11 ENCOUNTER — Ambulatory Visit (INDEPENDENT_AMBULATORY_CARE_PROVIDER_SITE_OTHER): Payer: Medicare Other | Admitting: Physician Assistant

## 2021-11-11 ENCOUNTER — Encounter: Payer: Self-pay | Admitting: Physician Assistant

## 2021-11-11 DIAGNOSIS — R310 Gross hematuria: Secondary | ICD-10-CM | POA: Diagnosis not present

## 2021-11-11 LAB — URINALYSIS, COMPLETE
Specific Gravity, UA: <1.005 — ABNORMAL LOW (ref 1.005–1.030)
pH, UA: >9 — ABNORMAL HIGH (ref 5.0–7.5)

## 2021-11-11 LAB — MICROSCOPIC EXAMINATION: RBC, Urine: >30 /hpf — AB (ref 0–2)

## 2021-11-11 LAB — BLADDER SCAN AMB NON-IMAGING: Scan Result: 24 mL

## 2021-11-11 NOTE — Progress Notes (Signed)
11/11/2021 9:33 AM   Ryan Allison 1941-02-09 [id number]  CC: Chief Complaint  Patient presents with   Follow-up   Hematuria   HPI: Ryan Allison is a 81 y.o. male with PMH intermediate risk prostate cancer s/p EBRT and ADT followed by [facility] oncology, nephrolithiasis, lower urinary tract symptoms who failed alpha blockers, and recurrent gross hematuria with cystoscopic findings of prominent hypervascularity of the prostate on anticoagulation with Xarelto who presents today for evaluation of worsening gross hematuria.  He is accompanied today by his neighbor, who contributes to HPI.  Today he reports he developed some mild gross hematuria 2 to 3 days ago and stopped Xarelto secondary to this.  Last night, his gross hematuria significantly worsened and remains worsened this morning.  He denies dysuria or lower abdominal pain.  He states he has passed several small clots approximately the size of his thumbnail but continues to feel he is emptying his bladder well.  Most recent hemoglobin on 07/04/2021 was 15.7.  Notably, he recently declined recommended CT urogram and cystoscopy with Dr. Bernardo Heater.  In-office UA today positive for red, cloudy color, trace glucose, 2+ ketones, 3+ blood, 3+ protein, 8.0 EU/DL urobilinogen, and 3+ leukocyte esterase; urine microscopy with >30 RBCs/HPF, granular casts, amorphous sediment, and many bacteria. PVR 56m.  PMH: Past Medical History:  Diagnosis Date   Aortic atherosclerosis ([place]    Arthritis    Atrial flutter ([place]    a. diagnosed 05/2018; b. not on [place]/2 recurrent life threatening GI bleed; c. CHADS2VASc => 3 (age x 2, vascular disease)   Cancer ([place] 2011   prostate   GI bleed    History of echocardiogram    a. TTE 05/2018: EF of 60-65%, normal wall motion, mild MR, left atrium normal in size, RVSF normal, PASP 33 mmHg, rhythm was atrial flutter   History of GI diverticular bleed    [facility]   HLD (hyperlipidemia)    Hyperlipidemia    Hypertension    Meniscus tear    OSA on CPAP    Peripheral neuropathy     Surgical History: Past Surgical History:  Procedure Laterality Date   COLONOSCOPY Left 06/07/2018   Procedure: COLONOSCOPY;  Surgeon: TVirgel Manifold MD;  Location: [facility] ENDOSCOPY;  Service: Endoscopy;  Laterality: Left;   COLONOSCOPY WITH PROPOFOL N/A 04/06/2015   Procedure: COLONOSCOPY WITH PROPOFOL;  Surgeon: MLollie Sails MD;  [facility];  Service: Endoscopy;  Laterality: N/A;   COLONOSCOPY WITH PROPOFOL N/A 06/07/2018   Procedure: COLONOSCOPY WITH PROPOFOL;  Surgeon: TVirgel Manifold MD;  Location: [facility] ENDOSCOPY;  Service: Endoscopy;  Laterality: N/A;   HERNIA REPAIR Left 09-23-14   inguinal   INGUINAL HERNIA REPAIR Left 09/23/2014   Procedure: HERNIA REPAIR INGUINAL ADULT;  Surgeon: SChristene Lye MD;  Location: [facility] ORS;  Service: General;  Laterality: Left;   MENISCUS REPAIR  2008   VASCULAR SURGERY  2015    Home Medications:  Allergies as of 11/11/2021       Reactions   Nsaids Other (See Comments)   Diverticular bleed Diverticular bleed   Tolmetin    Other reaction(s): Other (See Comments) Diverticular bleed Diverticular bleed   Celecoxib Itching, Rash   Diltiazem Hcl Rash   Other Other (See Comments)   Diverticular bleed   Venlafaxine Other (See Comments)   sedation sedation        Medication List  Accurate as of November 11, 2021  9:33 AM. If you have any questions, ask your nurse or doctor.          acetaminophen 500 MG tablet Commonly known as: TYLENOL Take 500 mg by mouth daily as needed for fever or pain.   cyanocobalamin 1000 MCG tablet Take 1,000 mcg by mouth daily.   furosemide 20 MG tablet Commonly known as: LASIX TAKE 1 TABLET DAILY MONDAY THROUGH FRIDAY. DO NOT TAKE ON SATURDAY AND SUNDAY   gabapentin 600 MG tablet Commonly known as: NEURONTIN Take 600 mg by mouth 5  (five) times daily.   metoprolol tartrate 25 MG tablet Commonly known as: LOPRESSOR TAKE ONE-HALF (1/2) TABLET DAILY   simvastatin 20 MG tablet Commonly known as: ZOCOR Take 20 mg by mouth at bedtime.   traMADol 50 MG tablet Commonly known as: ULTRAM Take 50 mg by mouth every 6 (six) hours as needed for moderate pain.   Xarelto 20 MG Tabs tablet Generic drug: rivaroxaban TAKE 1 TABLET DAILY WITH SUPPER        Allergies:  Allergies  Allergen Reactions   Nsaids Other (See Comments)    Diverticular bleed Diverticular bleed   Tolmetin     Other reaction(s): Other (See Comments) Diverticular bleed Diverticular bleed   Celecoxib Itching and Rash   Diltiazem Hcl Rash   Other Other (See Comments)    Diverticular bleed   Venlafaxine Other (See Comments)    sedation sedation    Family History: Family History  Problem Relation Age of Onset   Dementia Mother    Cancer Father    Cancer Brother     Social History:   reports that he quit smoking about 52 years ago. His smoking use included cigarettes. He has been exposed to tobacco smoke. He has never used smokeless tobacco. He reports that he does not drink alcohol and does not use drugs.  Physical Exam: There were no vitals taken for this visit.  Constitutional:  Alert and oriented, no acute distress, nontoxic appearing HEENT: [place], AT Cardiovascular: No clubbing, cyanosis, or edema Respiratory: Normal respiratory effort, no increased work of breathing Skin: No rashes, bruises or suspicious lesions Neurologic: Grossly intact, no focal deficits, moving all 4 extremities Psychiatric: Normal mood and affect  Laboratory Data: Results for orders placed or performed in visit on 11/11/21  Microscopic Examination   Urine  Result Value Ref Range   WBC, UA 0-5 0 - 5 /hpf   RBC, Urine >30 (A) 0 - 2 /hpf   Epithelial Cells (non renal) 0-10 0 - 10 /hpf   Casts Present (A) None seen /lpf   Cast Type Granular casts (A) N/A    Crystals Present (A) N/A   Crystal Type WILL FOLLOW    Bacteria, UA Many (A) None seen/Few  Urinalysis, Complete  Result Value Ref Range   Specific Gravity, UA <1.005 (L) 1.005 - 1.030   pH, UA >9.0 (H) 5.0 - 7.5   Color, UA Red (A) Yellow   Appearance Ur Cloudy (A) Clear   Protein,UA CANCELED    Glucose, UA CANCELED    Ketones, UA CANCELED    RBC, UA 3+ (A) Negative   Microscopic Examination See below:   BLADDER SCAN AMB NON-IMAGING  Result Value Ref Range   Scan Result 24 mL   Assessment & Plan:   1. Gross hematuria PVR WNL, low concern for clot retention today.  I offered him Foley catheter placement with bladder irrigation in an attempt  to stop his active bleeding, however he was adamantly opposed to Foley catheter placement today and declined intervention.  We discussed that the risks of deferring intervention include prolonged bleeding and clot retention.  He continued to decline intervention today.  In an attempt to avoid Foley catheter and hospitalization, we have elected for conservative management.  I instructed him to continue to hold his Xarelto over the weekend and stay well-hydrated to dilute the urine.  I will plan to see him back in clinic on Monday to repeat a UA and reassess his bleeding.  I provided him with extensive written and verbal instructions regarding return precautions and counseled him to proceed to the emergency department if he develops signs of clot retention or acute blood loss anemia.  Fortunately, his most recent hemoglobin was within normal limits.  He is willing to proceed with CT urogram and cystoscopy at this time.  We discussed that his bleeding is most likely prostatic in origin, however he has risk factors for bleeding of other etiologies given his history of prostate radiation and nephrolithiasis in conjunction with Xarelto use.  I have placed orders for CT urogram today assuming his renal function will permit this and am scheduling him for  cystoscopy with Dr. Bernardo Heater next month.  If he presents to the emergency department over the weekend, I recommend placement of a large, [address] [place] or larger, three-way (hematuria) coud catheter with initial plans for manual irrigation that would allow for initiation of CBI as needed. - Urinalysis, Complete - BLADDER SCAN AMB NON-IMAGING - CT HEMATURIA WORKUP; Future - CULTURE, URINE COMPREHENSIVE   Return in 3 days (on 11/14/2021) for Repeat UA and symptom recheck + 2 weeks cystoscopy with Dr. Bernardo Heater.  Debroah Loop, PA-C  [facility] [address] [place], [place] [address] [phone]

## 2021-11-11 NOTE — Patient Instructions (Addendum)
Stay off Xarelto over the weekend and stay well hydrated.  Please go to the Emergency Department over the weekend if you experience any of the following: -Inability to urinate -Lower abdominal swelling or pain -Passage of large clots, e.g. the size of your palm -Shortness of breath -Extreme fatigue -Altered mental status

## 2021-11-14 ENCOUNTER — Encounter: Payer: Self-pay | Admitting: Physician Assistant

## 2021-11-14 ENCOUNTER — Ambulatory Visit (INDEPENDENT_AMBULATORY_CARE_PROVIDER_SITE_OTHER): Payer: Medicare Other | Admitting: Physician Assistant

## 2021-11-14 VITALS — BP 102/62 | HR 74 | Ht 70.0 in | Wt 157.0 lb

## 2021-11-14 DIAGNOSIS — R310 Gross hematuria: Secondary | ICD-10-CM

## 2021-11-14 LAB — MICROSCOPIC EXAMINATION

## 2021-11-14 LAB — URINALYSIS, COMPLETE
Bilirubin, UA: NEGATIVE
Glucose, UA: NEGATIVE
Ketones, UA: NEGATIVE
Leukocytes,UA: NEGATIVE
Nitrite, UA: NEGATIVE
Specific Gravity, UA: 1.01 (ref 1.005–1.030)
Urobilinogen, Ur: 0.2 mg/dL (ref 0.2–1.0)
pH, UA: 5 (ref 5.0–7.5)

## 2021-11-14 LAB — BLADDER SCAN AMB NON-IMAGING

## 2021-11-14 LAB — CULTURE, URINE COMPREHENSIVE

## 2021-11-14 NOTE — Progress Notes (Signed)
11/14/2021 8:53 AM   Ryan Allison 17-Jul-1940 [id number]  CC: Chief Complaint  Patient presents with   Follow-up   HPI: Ryan Allison is a 81 y.o. 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   Today he reports gross hematuria resolved 3 nights ago.  He remains off Xarelto.  He reports some left flank pain yesterday that has since resolved.   Hematuria work-up has been ordered for him and he is scheduled for cystoscopy with Dr. Bernardo Heater in 10 days.  In-office UA today positive for 3+ blood and trace protein; urine microscopy with 11-30 RBCs/HPF. PVR 61m.  PMH: Past Medical History:  Diagnosis Date   Aortic atherosclerosis ([place]    Arthritis    Atrial flutter ([place]    a. diagnosed 05/2018; b. not on [place]/2 recurrent life threatening GI bleed; c. CHADS2VASc => 3 (age x 2, vascular disease)   Cancer ([place] 2011   prostate   GI bleed    History of echocardiogram    a. TTE 05/2018: EF of 60-65%, normal wall motion, mild MR, left atrium normal in size, RVSF normal, PASP 33 mmHg, rhythm was atrial flutter   History of GI diverticular bleed    [facility]   HLD (hyperlipidemia)    Hyperlipidemia    Hypertension    Meniscus tear    OSA on CPAP    Peripheral neuropathy     Surgical History: Past Surgical History:  Procedure Laterality Date   COLONOSCOPY Left 06/07/2018   Procedure: COLONOSCOPY;  Surgeon: TVirgel Manifold MD;  Location: [facility] ENDOSCOPY;  Service: Endoscopy;  Laterality: Left;   COLONOSCOPY WITH PROPOFOL N/A 04/06/2015   Procedure: COLONOSCOPY WITH PROPOFOL;   Surgeon: MLollie Sails MD;  [facility];  Service: Endoscopy;  Laterality: N/A;   COLONOSCOPY WITH PROPOFOL N/A 06/07/2018   Procedure: COLONOSCOPY WITH PROPOFOL;  Surgeon: TVirgel Manifold MD;  Location: [facility] ENDOSCOPY;  Service: Endoscopy;  Laterality: N/A;   HERNIA REPAIR Left 09-23-14   inguinal   INGUINAL HERNIA REPAIR Left 09/23/2014   Procedure: HERNIA REPAIR INGUINAL ADULT;  Surgeon: SChristene Lye MD;  Location: [facility] ORS;  Service: General;  Laterality: Left;   MENISCUS REPAIR  2008   VASCULAR SURGERY  2015    Home Medications:  Allergies as of 11/14/2021       Reactions   Nsaids Other (See Comments)   Diverticular bleed Diverticular bleed   Tolmetin    Other reaction(s): Other (See Comments) Diverticular bleed Diverticular bleed   Celecoxib Itching, Rash   Diltiazem Hcl Rash   Other Other (See Comments)   Diverticular bleed   Venlafaxine Other (See Comments)   sedation sedation        Medication List        Accurate as of November 14, 2021  8:53 AM. If you have any questions, ask your nurse or doctor.          STOP taking these medications    furosemide 20 MG tablet Commonly known as: LASIX Stopped by: SDebroah Loop PA-C       TAKE these medications  acetaminophen 500 MG tablet Commonly known as: TYLENOL Take 500 mg by mouth daily as needed for fever or pain.   cyanocobalamin 1000 MCG tablet Take 1,000 mcg by mouth daily.   gabapentin 600 MG tablet Commonly known as: NEURONTIN Take 600 mg by mouth 5 (five) times daily.   metoprolol tartrate 25 MG tablet Commonly known as: LOPRESSOR TAKE ONE-HALF (1/2) TABLET DAILY   simvastatin 20 MG tablet Commonly known as: ZOCOR Take 20 mg by mouth at bedtime.   traMADol 50 MG tablet Commonly known as: ULTRAM Take 50 mg by mouth every 6 (six) hours as needed for moderate pain.   Xarelto 20 MG Tabs tablet Generic drug: rivaroxaban TAKE 1 TABLET DAILY WITH  SUPPER        Allergies:  Allergies  Allergen Reactions   Nsaids Other (See Comments)    Diverticular bleed Diverticular bleed   Tolmetin     Other reaction(s): Other (See Comments) Diverticular bleed Diverticular bleed   Celecoxib Itching and Rash   Diltiazem Hcl Rash   Other Other (See Comments)    Diverticular bleed   Venlafaxine Other (See Comments)    sedation sedation    Family History: Family History  Problem Relation Age of Onset   Dementia Mother    Cancer Father    Cancer Brother     Social History:   reports that he quit smoking about 52 years ago. His smoking use included cigarettes. He has been exposed to tobacco smoke. He has never used smokeless tobacco. He reports that he does not drink alcohol and does not use drugs.  Physical Exam: BP 102/62   Pulse 74   Ht '5\' 10"'$  (1.778 m)   Wt 157 lb (71.2 kg)   BMI 22.53 kg/m   Constitutional:  Alert and oriented, no acute distress, nontoxic appearing HEENT: [place], AT Cardiovascular: No clubbing, cyanosis, or edema Respiratory: Normal respiratory effort, no increased work of breathing Skin: No rashes, bruises or suspicious lesions Neurologic: Grossly intact, no focal deficits, moving all 4 extremities Psychiatric: Normal mood and affect  Laboratory Data: Results for orders placed or performed in visit on 11/14/21  Microscopic Examination   Urine  Result Value Ref Range   WBC, UA 0-5 0 - 5 /hpf   RBC, Urine 11-30 (A) 0 - 2 /hpf   Epithelial Cells (non renal) 0-10 0 - 10 /hpf   Bacteria, UA Few None seen/Few  Urinalysis, Complete  Result Value Ref Range   Specific Gravity, UA 1.010 1.005 - 1.030   pH, UA 5.0 5.0 - 7.5   Color, UA Yellow Yellow   Appearance Ur Clear Clear   Leukocytes,UA Negative Negative   Protein,UA Trace (A) Negative/Trace   Glucose, UA Negative Negative   Ketones, UA Negative Negative   RBC, UA 3+ (A) Negative   Bilirubin, UA Negative Negative   Urobilinogen, Ur 0.2 0.2 - 1.0  mg/dL   Nitrite, UA Negative Negative   Microscopic Examination See below:   BLADDER SCAN AMB NON-IMAGING  Result Value Ref Range   Scan Result 47m    Assessment & Plan:   1. Gross hematuria Resolved.  Okay to resume Xarelto tomorrow.  We discussed the importance of proceeding with hematuria work-up as scheduled.  He expressed understanding. - Urinalysis, Complete - BLADDER SCAN AMB NON-IMAGING  Return if symptoms worsen or fail to improve, for follow up as scheduled.  SDebroah Loop PA-C  [facility] [address] [place] [phone]    

## 2021-11-16 ENCOUNTER — Telehealth: Admit: 2021-11-16 | Discharge: 2021-11-17 | Payer: MEDICARE | Attending: Registered" | Primary: Registered"

## 2021-11-17 ENCOUNTER — Telehealth
Admit: 2021-11-17 | Discharge: 2021-11-18 | Payer: MEDICARE | Attending: Hematology & Oncology | Primary: Hematology & Oncology

## 2021-11-17 DIAGNOSIS — C61 Malignant neoplasm of prostate: Principal | ICD-10-CM

## 2021-11-17 MED ORDER — BICALUTAMIDE 50 MG TABLET
ORAL_TABLET | Freq: Every day | ORAL | 5 refills | 30 days | Status: CP
Start: 2021-11-17 — End: ?

## 2021-11-22 ENCOUNTER — Ambulatory Visit (INDEPENDENT_AMBULATORY_CARE_PROVIDER_SITE_OTHER): Payer: Medicare Other | Admitting: Urology

## 2021-11-22 ENCOUNTER — Encounter: Payer: Self-pay | Admitting: Urology

## 2021-11-22 VITALS — BP 151/89 | HR 99 | Ht 72.0 in | Wt 155.0 lb

## 2021-11-22 DIAGNOSIS — R310 Gross hematuria: Secondary | ICD-10-CM

## 2021-11-22 LAB — URINALYSIS, COMPLETE
Bilirubin, UA: NEGATIVE
Glucose, UA: NEGATIVE
Ketones, UA: NEGATIVE
Leukocytes,UA: NEGATIVE
Nitrite, UA: NEGATIVE
Specific Gravity, UA: >1.03 — ABNORMAL HIGH (ref 1.005–1.030)
Urobilinogen, Ur: 0.2 mg/dL (ref 0.2–1.0)
pH, UA: 5 (ref 5.0–7.5)

## 2021-11-22 LAB — MICROSCOPIC EXAMINATION: RBC, Urine: >30 /hpf — ABNORMAL HIGH (ref 0–2)

## 2021-11-22 NOTE — Patient Instructions (Signed)
You can call scheduling to get the CT scan appointment at [phone]

## 2021-11-24 ENCOUNTER — Encounter: Payer: Self-pay | Admitting: Urology

## 2021-11-24 ENCOUNTER — Telehealth: Payer: Self-pay | Admitting: Cardiovascular Disease

## 2021-11-24 ENCOUNTER — Other Ambulatory Visit: Payer: Medicare Other | Admitting: Urology

## 2021-11-24 NOTE — Progress Notes (Signed)
   11/24/21  CC:  Chief Complaint  Patient presents with   Cysto    HPI: Refer to Sam Vaillancourt's note of 11/11/2021 and 11/14/2021.  His hematuria resolved after holding Xarelto.  CT urogram scheduled 11/29/2021  Blood pressure (!) 151/89, pulse 99, height 6' (1.829 m), weight 155 lb (70.3 kg). NED. A&Ox3.   No respiratory distress   Abd soft, NT, ND Normal phallus with bilateral descended testicles  Cystoscopy Procedure Note  Patient identification was confirmed, informed consent was obtained, and patient was prepped using Betadine solution.  Lidocaine jelly was administered per urethral meatus.     Pre-Procedure: - Inspection reveals a normal caliber urethral meatus.  Procedure: The flexible cystoscope was introduced without difficulty - No urethral strictures/lesions are present. -  Moderate lateral lobe enlargement  prostate with prominent hypervascularity - Normal bladder neck - Bilateral ureteral orifices identified - Bladder mucosa  reveals no ulcers, tumors, or lesions - No bladder stones -Mild trabeculation  Retroflexion shows prominent superficial vessels bladder neck   Post-Procedure: - Patient tolerated the procedure well  Assessment/ Plan: No definite bleeding source identified with the only significant finding prominent hypervascularity prostatic urethra and bladder neck Bladder does not have a typical appearance of radiation cystitis CTU scheduled for 7/18 and will review to make sure no upper tract abnormalities Consider cystoscopy when actively bleeding to see if a source can be identified and if it appears to be prostatic considered TUVP    Abbie Sons, MD

## 2021-11-24 NOTE — Telephone Encounter (Signed)
   Pre-operative Risk Assessment    Patient Name: Ryan Allison  DOB: March 03, 1941 MRN: [id number]      Request for Surgical Clearance    Procedure:   EGD  Date of Surgery:  Clearance TBD                                 Surgeon:  NOT INDICATED  Surgeon's Group or Practice Name:  [facility] ENDO Phone number:  [phone] Fax number:  [phone]   Type of Clearance Requested:   - Pharmacy:  Hold Rivaroxaban (Xarelto) PL,EASE ADVISE   Type of Anesthesia:  Not Indicated   Additional requests/questions:    Jonathon Jordan   11/24/2021, 8:15 AM

## 2021-11-25 NOTE — Telephone Encounter (Signed)
Patient with diagnosis of aflutter on Xarelto for anticoagulation.    Procedure: EGD Date of procedure: TBD  CHA2DS2-VASc Score = 5  This indicates a 7.2% annual risk of stroke. The patient's score is based upon: CHF History: 1 HTN History: 1 Diabetes History: 0 Stroke History: 0 Vascular Disease History: 1 Age Score: 2 Gender Score: 0   CrCl 22m/min Platelet count 178K  Pt has not been seen by cardiology in over a year. Pending no new clinical changes at needed preop visit, patient can hold Xarelto for 1-2 days prior to procedure. Of note, he is on the incorrect dose of Xarelto. CrCl is < 50, his dose should be reduced to '15mg'$  daily especially with hx of recurrent GI bleeds.  **This guidance is not considered finalized until pre-operative APP has relayed final recommendations.**

## 2021-11-25 NOTE — Telephone Encounter (Signed)
Pt has appt in office with Dr. Rockey Situ 12/06/21. Will add need pre op clearance to appt notes. Will update the requesting office the pt has appt 12/06/21

## 2021-11-29 ENCOUNTER — Ambulatory Visit
Admission: RE | Admit: 2021-11-29 | Discharge: 2021-11-29 | Disposition: A | Discharge status: Home / Self Care | Payer: Medicare Other | Source: Ambulatory Visit | Attending: Physician Assistant | Admitting: Physician Assistant

## 2021-11-29 DIAGNOSIS — R310 Gross hematuria: Secondary | ICD-10-CM | POA: Insufficient documentation

## 2021-11-29 MED ORDER — IOHEXOL 300 MG/ML  SOLN
100.0000 mL | Freq: Once | INTRAMUSCULAR | Status: AC | PRN
  Administered 2021-11-29: 100 mL via INTRAVENOUS

## 2021-12-05 ENCOUNTER — Telehealth: Payer: Self-pay | Admitting: *Deleted

## 2021-12-05 ENCOUNTER — Ambulatory Visit: Payer: Medicare Other | Admitting: Cardiovascular Disease

## 2021-12-05 NOTE — Telephone Encounter (Signed)
Notified patient as instructed, Patient will call if he notices blood in his urine.

## 2021-12-05 NOTE — Progress Notes (Unsigned)
Date:  12/06/2021   ID:  Ryan Allison Ryan Allison, MRN [id number]  Patient Location:  [place] [address]   Provider location:   Ryan Allison, [place] office  PCP:  Ryan Hector, MD  Cardiologist:  Ryan Allison [facility]   Chief Complaint  Patient presents with   12 month follow up     Pre op clearance for an upper endoscopy and colonoscopy per GI @ [facility] due to a bleed. Medications reviewed by the patient verbally. Patient c/o pounding in chest at times. Medications reviewed by the patient verbally.     History of Present Illness:    Ryan Allison is a 81 y.o. male  past medical history of atrial flutter of uncertain chronicity in 05/2018 not on full dose anticoagulation secondary to prior life threatening GI bleeds,  recurrent prostate cancer , PET scan from 04/2018 showing no metastatic disease,  aortic atherosclerosis,  HLD,  OSA on CPAP,  diverticulosis,  peripheral neuropathy,  prior tobacco abuse,  GERD In the hospital  from 06/06/2018 to 06/08/18 for GI bleed and  atrial flutter Persistent atrial fibrillation since January 2020, persistent Who presents for atrial flutter, shortness of breath/diastolic CHF, weakness  Last seen in clinic May 2022, was normal sinus rhythm at that time In atrial flutter 2/23, atrial flutter today Reports he may have some fatigue and atrial flutter otherwise is asymptomatic  Continues to have bleeding urine and rectal/GI On xarelto 20 mg daily Creatinine clearance less than 50, recent recommendation to decrease Xarelto down to 15 mg  Scheduled for EGD, and colo, needs to hold anticoagulation several days prior to procedure  Working with urology, has hematuria at times, has completed cystoscopy  Still with ostomy Weight stable, "a challenge" to maintain 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  Lab work reviewed HGB 13.5  muscle atrophy,leg weakness, some gait instability  labs today  reviewed with him Total chol 125, LDL 42 Normal BMP and CBC  EKG personally reviewed by myself on todays visit Atrial flutter rate 71 bpm Prior EKG 09/2019 was Atrial flutter rate 76 bpm 5/22: was NSR  Other past medical history reviewed Colectomy at [facility] February 2020 Started on Eliquis 5 twice daily, no recurrent bleeding Was only taking metoprolol once a day Previous office visits not interested in cardioversion  Echocardiogram Sep 19, 2019,   ejection fraction 50 to 55% Mildly dilated left atrium Rhythm is atrial flutter Mildly elevated Allison heart pressures   [facility] on 06/06/18 following 2 episodes of BRBPR with associated presyncope, SOB, and palpitations.   in atrial flutter with RVR.   transfusion of pRBC secondary to blood loss. He was rate controlled.   not able to be placed on anticoagulation given recurrent GI bleeding.   colonoscopy on 06/07/2018 that showed possible evidence of diverticular bleed in the sigmoid colon, diverticulosis of the transverse, ascending, and sigmoid colon as well as radiation proctitis. It was recommended he follow up with a colorectal surgeon for possible colectomy due to recurrent diverticular bleed.   Echo during his admission on 06/07/2018 showed an EF of 60-65%, normal wall motion, mild MR, left atrium normal in size, RVSF normal, PASP 33 mmHg, rhythm was atrial flutter.   Seen by PCP on 06/11/2018, EKG showed he remained in atrial flutter  PCP again on 06/17/2018 with a rash that started underneath his axilla, though spread diffusely. He had already self discontinued Cardizem with some improvement in rash.  He was placed on metoprolol for rate control of his atrial flutter.  Continues on metoprolol  Prior to GI surgery, had no sx of SOB from his atrial flutter Underwent colectomy at [facility], for diverticuli and GI bleeding, 07/12/18 In follow up had SBO, at [facility] x 1 week Ostomy output has been good.  Drinking Gatorade to stay hydrated  Echo  05/2018 - Left ventricle: The cavity size was normal. Systolic function was   normal. The estimated ejection fraction was in the range of 60%   to 65%. Wall motion was normal; there were no regional wall   motion abnormalities. - Mitral valve: There was mild regurgitation. - Left atrium: The atrium was normal in size. - Allison ventricle: Systolic function was normal. - Pulmonary arteries: Systolic pressure was within the normal   range. PA peak pressure: 33 mm Hg (S).   Impressions: - Rhythm is atrial flutter.    Past Medical History:  Diagnosis Date   Aortic atherosclerosis ([place])    Arthritis    Atrial flutter ([place])    a. diagnosed 05/2018; b. not on [place] 2/2 recurrent life threatening GI bleed; c. CHADS2VASc => 3 (age x 2, vascular disease)   Cancer ([place]) 2011   prostate   GI bleed    History of echocardiogram    a. TTE 05/2018: EF of 60-65%, normal wall motion, mild MR, left atrium normal in size, RVSF normal, PASP 33 mmHg, rhythm was atrial flutter   History of GI diverticular bleed    extended stay in [facility] due to GI bleeding prior to 2016   HLD (hyperlipidemia)    Hyperlipidemia    Hypertension    Meniscus tear    OSA on CPAP    Peripheral neuropathy    Past Surgical History:  Procedure Laterality Date   COLONOSCOPY Left 06/07/2018   Procedure: COLONOSCOPY;  Surgeon: Virgel Manifold, MD;  Location: [facility] ENDOSCOPY;  Service: Endoscopy;  Laterality: Left;   COLONOSCOPY WITH PROPOFOL N/A 04/06/2015   Procedure: COLONOSCOPY WITH PROPOFOL;  Surgeon: Lollie Sails, MD;  [facility] END[facility]COPY;  Service: Endoscopy;  Laterality: N/A;   COLONOSCOPY WITH PROPOFOL N/A 06/07/2018   Procedure: COLONOSCOPY WITH PROPOFOL;  Surgeon: Virgel Manifold, MD;  Location: [facility] ENDOSCOPY;  Service: Endoscopy;  Laterality: N/A;   HERNIA REPAIR Left 09-23-14   inguinal   INGUINAL HERNIA REPAIR Left 09/23/2014   Procedure: HERNIA REPAIR INGUINAL ADULT;  Surgeon: Christene Lye, MD;  Location: [facility] ORS;  Service: General;  Laterality: Left;   MENISCUS REPAIR  2008   VASCULAR SURGERY  2015     Current Meds  Medication Sig   acetaminophen (TYLENOL) 500 MG tablet Take 500 mg by mouth daily as needed for fever or pain.    bicalutamide (CASODEX) 50 MG tablet Take 50 mg by mouth daily.   cyanocobalamin 1000 MCG tablet Take 1,000 mcg by mouth daily.   gabapentin (NEURONTIN) 600 MG tablet Take 600 mg by mouth 5 (five) times daily.    metoprolol tartrate (LOPRESSOR) 25 MG tablet TAKE ONE-HALF (1/2) TABLET DAILY   simvastatin (ZOCOR) 20 MG tablet Take 20 mg by mouth at bedtime.    traMADol (ULTRAM) 50 MG tablet Take 50 mg by mouth every 6 (six) hours as needed for moderate pain.    XARELTO 20 MG TABS tablet TAKE 1 TABLET DAILY WITH SUPPER     Allergies:   Nsaids, Tolmetin, Celecoxib, Diltiazem hcl, Other, and Venlafaxine   Social History  Tobacco Use   Smoking status: Former    Types: Cigarettes    Quit date: 05/15/1969    Years since quitting: 52.5    Passive exposure: Past   Smokeless tobacco: Never  Vaping Use   Vaping Use: Never used  Substance Use Topics   Alcohol use: No   Drug use: No     Family Hx: The patient's family history includes Cancer in his brother and father; Dementia in his mother.  ROS:   Please see the history of present illness.    Review of Systems  Constitutional:  Positive for malaise/fatigue.  Respiratory:  Positive for shortness of breath.   Cardiovascular: Negative.   Gastrointestinal: Negative.   Musculoskeletal: Negative.   Neurological: Negative.   Psychiatric/Behavioral: Negative.    All other systems reviewed and are negative.   Labs/Other Tests and Data Reviewed:    Recent Labs: 06/25/2021: ALT 14; BUN 25; Creatinine, Ser 1.02; Hemoglobin 14.7; Platelets 173; Potassium 3.7; Sodium 134   Recent Lipid Panel No results found for: "CHOL", "TRIG", "HDL", "CHOLHDL", "LDLCALC", "LDLDIRECT"  Wt Readings from  Last 3 Encounters:  12/06/21 158 lb 4 oz (71.8 kg)  11/22/21 155 lb (70.3 kg)  11/14/21 157 lb (71.2 kg)     Exam:    BP 122/80 (BP Location: Left Arm, Patient Position: Sitting, Cuff Size: Normal)   Pulse 71   Ht '5\' 10"'$  (1.778 m)   Wt 158 lb 4 oz (71.8 kg)   SpO2 98%   BMI 22.71 kg/m  Constitutional:  oriented to person, place, and time. No distress.  HENT:  Head: Grossly normal Eyes:  no discharge. No scleral icterus.  Neck: No JVD, no carotid bruits  Cardiovascular: Regular rate and rhythm, no murmurs appreciated Pulmonary/Chest: Clear to auscultation bilaterally, no wheezes or rails Abdominal: Soft.  no distension.  no tenderness.  Musculoskeletal: Normal range of motion Neurological:  normal muscle tone. Coordination normal. No atrophy Skin: Skin warm and dry Psychiatric: normal affect, pleasant   ASSESSMENT & PLAN:    Preop cardiovascular Has EGD and colonoscopy, recommend he hold Xarelto 2 days prior to procedure, restart per GI following procedure  Atrial flutter, unspecified type [facility]) Noted February 2023 and again today, likely persistent Discussed various treatment options including referral to EP for discussion of ablation versus Watchman device, he will call us if he was interested In general he reports he is asymptomatic, he does not want to increase metoprolol up to 25 for periodic elevated ventricular rates Recommended decreasing Xarelto down to 15 mg daily We did discuss potentially changing over to Eliquis but he would require 5 twice daily He prefers to try low-dose Xarelto first  Chronic diastolic CHF  euvolemic Appears euvolemic on today's visit  Aortic atherosclerosis ([place]) Cholesterol is at goal on the current lipid regimen. No changes to the medications were made.  OSA on CPAP Managed by primary care  Weight loss/weakness Working with nutritionist, reports weight is stable   Total encounter time more than 40 minutes  Greater than 50% was  spent in counseling and coordination of care with the patient  Signed, Ida Rogue, MD  12/06/2021 10:16 AM    [facility] [address], [place], [place] [address]

## 2021-12-05 NOTE — Telephone Encounter (Signed)
-----   Message from Abbie Sons, MD sent at 12/04/2021  2:28 PM EDT ----- CT urogram showed nonobstructing renal calculi and renal cyst.  No findings identified that would pinpoint a source of his bleeding.  As we discussed at last office visit would recommend repeat cystoscopy at the time of active hematuria

## 2021-12-06 ENCOUNTER — Encounter: Payer: Self-pay | Admitting: Cardiovascular Disease

## 2021-12-06 ENCOUNTER — Ambulatory Visit (INDEPENDENT_AMBULATORY_CARE_PROVIDER_SITE_OTHER): Payer: Medicare Other | Admitting: Cardiovascular Disease

## 2021-12-06 VITALS — BP 122/80 | HR 71 | Ht 70.0 in | Wt 158.2 lb

## 2021-12-06 DIAGNOSIS — E782 Mixed hyperlipidemia: Secondary | ICD-10-CM | POA: Diagnosis not present

## 2021-12-06 DIAGNOSIS — G4733 Obstructive sleep apnea (adult) (pediatric): Secondary | ICD-10-CM

## 2021-12-06 DIAGNOSIS — I4892 Unspecified atrial flutter: Secondary | ICD-10-CM | POA: Diagnosis not present

## 2021-12-06 DIAGNOSIS — Z9989 Dependence on other enabling machines and devices: Secondary | ICD-10-CM

## 2021-12-06 DIAGNOSIS — D62 Acute posthemorrhagic anemia: Secondary | ICD-10-CM

## 2021-12-06 DIAGNOSIS — K922 Gastrointestinal hemorrhage, unspecified: Secondary | ICD-10-CM

## 2021-12-06 DIAGNOSIS — I700 Atherosclerosis of aorta: Secondary | ICD-10-CM

## 2021-12-06 MED ORDER — RIVAROXABAN 15 MG PO TABS: 15.0000 mg | ORAL_TABLET | Freq: Every day | ORAL | 3 refills | Status: DC

## 2021-12-06 NOTE — Patient Instructions (Addendum)
Medication Instructions:  Please decrease the xarelto down to 15 mg daily  Hold xarelto 2 days prior to GI procedures  If you need a refill on your cardiac medications before your next appointment, please call your pharmacy.   Lab work: No new labs needed  Testing/Procedures: No new testing needed  Follow-Up: At [facility], you and your health needs are our priority.  As part of our continuing mission to provide you with exceptional heart care, we have created designated Provider Care Teams.  These Care Teams include your primary Cardiologist (physician) and Advanced Practice Providers (APPs -  Physician Assistants and Nurse Practitioners) who all work together to provide you with the care you need, when you need it.  You will need a follow up appointment in 6 months  Providers on your designated Care Team:   Murray Hodgkins, NP Christell Faith, PA-C Cadence Kathlen Mody, [place]  COVID-19 Vaccine Information can be found at: ShippingScam.co.uk For questions related to vaccine distribution or appointments, please email [email or web address] or call [phone].

## 2021-12-22 ENCOUNTER — Telehealth: Admit: 2021-12-22 | Discharge: 2021-12-23 | Payer: MEDICARE | Attending: Registered" | Primary: Registered"

## 2022-01-03 ENCOUNTER — Telehealth: Payer: Self-pay | Admitting: Cardiovascular Disease

## 2022-01-03 NOTE — Telephone Encounter (Signed)
Called pt in regards to bleeding.  Reports has quite a bit of bright red blood in urine when takes Xarelto.  Has had a urology consult and cause for bleeding has not been found.    Reports when takes Xarelto 15 mg hematuria occurs; if doesn't take urine clears up.  Did not take Xarelto yesterday and he does not have bleeding today.  Wants to know if dose can be decreased to 10 mg or 7.5 or if will need a different blood thinner.  Expresses Dr. Rockey Situ told him to keep him updated.   Will send to Dr. Rockey Situ to advise.

## 2022-01-03 NOTE — Telephone Encounter (Signed)
Pt c/o medication issue:  1. Name of Medication: Xarelto  2. How are you currently taking this medication (dosage and times per day)? 15 MG  3. Are you having a reaction (difficulty breathing--STAT)? Still bleeding  4. What is your medication issue? Patient would like to know if he should go down to 10 MG or half of 15 MG or maybe go back to Eliquis.  Please call to discuss

## 2022-01-04 NOTE — Telephone Encounter (Signed)
Minna Merritts, MD  Sent: Wed January 04, 2022  1:26 PM  To: [facility] Triage          Message  Low-dose Xarelto may not provide protection  He is very close to qualifying for Eliquis 2.5 twice daily, creatinine 1.5, close to 81 years old  Perhaps we could try that  Alternatively he could talk with the EP for consideration of Watchman device to potentially come off Eliquis in the future  Thx  TGollan

## 2022-01-06 NOTE — Telephone Encounter (Signed)
Patient been splitting the '15mg'$  in half to 7.'5mg'$  but he states that 7.5 is till causing him to believe. And he states that the '10mg'$  was still be too high as well. Please advise

## 2022-01-09 ENCOUNTER — Encounter: Payer: Self-pay | Admitting: *Deleted

## 2022-01-09 MED ORDER — APIXABAN 2.5 MG PO TABS: 2.5000 mg | ORAL_TABLET | Freq: Two times a day (BID) | ORAL | 0 refills | Status: DC

## 2022-01-09 NOTE — Telephone Encounter (Signed)
I called and spoke with the patient.  He states he is still having blood in his urine even on lower dose Xarelto.  I have advised the patient that per Dr. Rockey Situ: - Lower dose Xarelto will not protect him from stroke prevention  Options include: 1) Stopping Xarelto 2) Starting Eliquis 2.5 mg BID 3) EP referral for consideration of Watchman  Per the patient, he and Dr. Rockey Situ discussed Watchman before and he only wants to use this as a last resort. He was told by Dr. Bernardo Heater, if he could locate the source of his bleeding, he could cauterize the area, but he cannot locate the source.  Therefore the patient would like to: - stop Xarelto - start Eliquis 2.5 mg BID  He is aware I will pull samples for his to start with. He typically takes his xarelto in the AM, but is only taking 7.5 mg once daily. He has not had his dose today.   He is advised to: - pick up his samples now - once his samples are picked up, he will take eliquis 2.5 mg this morning and another dose later this evening. - tomorrow he will space out his dosing to ~ q12 hours - he will monitor his bleeding and call us if this does not improve or worsens - if tolerating this med change, he will call when he has ~ 1 weeks worth of samples left and we will send a RX to his pharmacy. - he should have a follow up BMP/ CBC in ~ 4 weeks if he continues on Eliquis  I am placing a 30-day free trial card in his samples.   The patient voices understanding and is agreeable.  Samples Given: Eliquis 2.5 mg Lot: [id number] Exp: April 2024 # 4 boxes

## 2022-01-09 NOTE — Telephone Encounter (Signed)
Minna Merritts, MD  Sent: Sat January 07, 2022  9:57 PM  To: [facility]          Message  Can we see if he has mychart and send him the info below  If he is interested, we can refer to EP/lambert  Thx  TG      The WATCHMAN procedure is a minimally invasive procedure that is used to close off the left atrial appendage (LAA) in people with atrial fibrillation (AF). The LAA is a small pouch-like structure in the heart that can sometimes form blood clots, which can then travel to the brain and cause a stroke. The WATCHMAN device is a small, mesh-like implant that is placed in the LAA to prevent blood clots from forming. The procedure is performed under general anesthesia and typically takes about an hour.    The WATCHMAN procedure is an alternative to blood thinners for people with AF who are at high risk of stroke. Blood thinners can be effective at preventing strokes, but they can also have side effects, such as bleeding. The WATCHMAN procedure is a safe and effective way to reduce the risk of stroke in people with AF who are not able to take blood thinners or who have had problems with blood thinners.    Here are some of the benefits of the WATCHMAN procedure:    It is a minimally invasive procedure, which means that there is a smaller incision and less pain than with open-heart surgery.  It is a permanent solution, so you do not have to take blood thinners for the rest of your life.  It has been shown to be as effective as blood thinners at preventing strokes.  Here are some of the risks of the WATCHMAN procedure:    There is a small risk of stroke, heart attack, and bleeding.  The device can sometimes move out of position or leak.  You may need to take blood thinners for a short time after the procedure.

## 2022-01-13 ENCOUNTER — Telehealth: Payer: Self-pay | Admitting: Cardiovascular Disease

## 2022-01-13 DIAGNOSIS — I4892 Unspecified atrial flutter: Secondary | ICD-10-CM

## 2022-01-13 DIAGNOSIS — Z79899 Other long term (current) drug therapy: Secondary | ICD-10-CM

## 2022-01-13 NOTE — Telephone Encounter (Signed)
Pt c/o medication issue:  1. Name of Medication: apixaban (ELIQUIS) 2.5 MG TABS tablet  2. How are you currently taking this medication (dosage and times per day)? 1 tablet twice a day  3. Are you having a reaction (difficulty breathing--STAT)? no  4. What is your medication issue? Patient calling to speak with Nira Conn to discuss his eliquis. He says it does not seem to be working.

## 2022-01-13 NOTE — Telephone Encounter (Signed)
I spoke with the patient. He advised he started Eliquis 2.5 mg BID on 01/09/22.  He called today stating he is still noticing blood in his urine every time he urinates, but the bleeding is lighter and seems to be "old" blood.  I have advised him that it take ~ 72 hours for Eliquis to fully load in his system and he is clearing the Xarelto as well.   I have advised him to continue eliquis 2.5 mg BID over the weekend and will call him on Tuesday 9/5 to follow up on his symptoms.  He reports a history of prostate cancer treated with radiation in 2011. He has reached out to his oncologist who advised that some bleeding at this point is not uncommon. He has also been seen by Dr. Bernardo Heater who performed a cystoscopy/ CT scanning- both which were negative.   The patient advised Dr. Bernardo Heater stated he may still go in an cautherize "everything" since the actual source of the bleeding cannot be identified.   He wanted to know if his dose of eliquis could be reduced further. I advised he is currently on the lowest dose eliquis available and that will still provide him stroke protection. He still does not want to proceed with discussions about Watchman.  I have advised the patient again that I will follow up with him on Tuesday and re-evaluate how he is doing at that time. He is aware to reach out to the physician on call with any worsening of symptoms over the weekend.  The patient voices understanding and is agreeable.

## 2022-01-17 NOTE — Telephone Encounter (Signed)
I spoke with the patient to follow up on his bleeding.  Per the patient, he does feel his bleeding has slowed/ not as heavy since coming off Xarelto and starting Eliquis 2.5 mg BID.   I advised him that I will forward to Dr. Rockey Situ to review and advise further.   I have confirmed with the patient that his follow up with Dr. Bernardo Heater is not until 04/19/22.   The patient is aware I will call him back with further MD recommendations once received. If he remains on Eliquis, will provide a free- 30 day trial card I as missed putting this in his bag of samples. He will also need a repeat BMP/ CBC in ~ 3 weeks on this new therapy.  He has asked that I call [facility] to stop his shipment of Xarelto.   Will call [facility] after receiving Dr. Donivan Scull recommendations.

## 2022-01-18 ENCOUNTER — Telehealth: Admit: 2022-01-18 | Discharge: 2022-01-19 | Payer: MEDICARE | Attending: Registered" | Primary: Registered"

## 2022-01-18 MED ORDER — APIXABAN 2.5 MG PO TABS: 2.5000 mg | ORAL_TABLET | Freq: Two times a day (BID) | ORAL | 3 refills | Status: DC

## 2022-01-18 NOTE — Telephone Encounter (Signed)
I spoke with the patient. I advised him I sent his RX as soon as we got off the phone earlier. He states he will call [facility] and will have them put this on hold for right now.

## 2022-01-18 NOTE — Telephone Encounter (Signed)
Ryan Merritts, MD  Sent: Wed January 18, 2022  9:10 AM  To: Emily Filbert, RN          Message  Not much more we can do other than recommend follow-up with urology,  Consideration of Watchman device  Coming off anticoagulation would place him at higher risk of stroke  Thx  TG

## 2022-01-18 NOTE — Telephone Encounter (Signed)
I called and spoke with the patient. I have advised him of Dr. Donivan Scull updated recommendations.   The patient prefers to still defer Watchman. He will continue Eliquis at 2.5 mg BID. I have asked him to call Dr. Dene Gentry office to see if he can be seen sooner than December for his ongoing hematuria.   The patient prefers to have me send his Eliquis RX to mail order instead of picking up a local RX with the free 30-day trial card.  He will come for lab work the week of 02/06/22. BMP/ CBC  [facility] En[facility] at [facility] 1st desk on the right to check in (REGISTRATION)  Lab hours: Monday- Friday (7:30 am- 5:30 pm)  I advised the patient I would copy this note to Dr. Bernardo Heater as an Juluis Rainier.  The patient voices understanding of all of the above and is agreeable.

## 2022-01-18 NOTE — Telephone Encounter (Signed)
Pt called back to state that he did not want Eliquis sent to [facility] and would like this to be held for awhile until he decides where to send.

## 2022-01-18 NOTE — Telephone Encounter (Signed)
Recommend he contact the office if he notes recurrent "fresh" hematuria.  Would like to add on cystoscopy in office if that occurs

## 2022-01-19 NOTE — Telephone Encounter (Signed)
Notified patient as instructed, patient pleased °

## 2022-02-01 ENCOUNTER — Telehealth: Payer: Self-pay | Admitting: Cardiovascular Disease

## 2022-02-01 MED ORDER — APIXABAN 2.5 MG PO TABS: 2.5000 mg | ORAL_TABLET | Freq: Two times a day (BID) | ORAL | 3 refills | Status: DC

## 2022-02-01 NOTE — Telephone Encounter (Signed)
I spoke with the patient. He called stating:  1) His hematuria is much improved over the last 2-3 weeks on lower dose Eliquis 2.5 mg BID.  - he is still seeing some occassional old blood, but nothing new  2) He wanted to confirm he was on the right dose of Eliquis. - I advised the patient that taking into consideration: Age- 62 Creatinine- 1.5 Weight today per his report is 150 lbs (68 kg), down from 158 in July 2023 Hematuria That Eliquis 2.5 mg BID was felt to be the appropriate dose per Dr. Rockey Situ when this was changed on 01/18/22.   The patient voices understanding of the above and is agreeable.  He then advised the if his RX for Eliquis was not sent to [facility], he would like this sent to [facility].  I advised him that on 01/18/22, I had reported this was sent to [facility], but looking now, I see this defaulted to "sample" and did not go through.  The patient advised this was fine. He would actually like his RX for Eliquis 2.5 mg BID sent to [facility]. He would like to come pick up the 30-day trial card. RX sent to [facility] while on the phone with the patient. 30-day free trial card placed at the front desk. Patient aware.   He asked that I update Dr. Rockey Situ on his bleeding and thank him for his help.  To Dr. Rockey Situ as an Juluis Rainier.

## 2022-02-01 NOTE — Telephone Encounter (Signed)
.  Pt c/o medication issue:  1. Name of Medication: Eliquis  2. How are you currently taking this medication (dosage and times per day)? 2 times a day   3. Are you having a reaction (difficulty breathing--STAT)?   4. What is your medication issue?  Patient said he wanted to [facility], said he had some questions about his Eliquis

## 2022-02-02 ENCOUNTER — Telehealth: Payer: Self-pay | Admitting: Cardiovascular Disease

## 2022-02-02 NOTE — Telephone Encounter (Signed)
I pulled our 30-day free trial cards from the sample closet and all the English versions did expire 04/2021. I have contacted our Eliquis rep, Bill, and he will be bringing new cards tomorrow.   I have contacted the patient that we will pull an updated 30-day free trial card for him tomorrow afternoon to come pick up. Per the patient, he will be having a procedure tomorrow and unable to pick this up.  I advised I will be out of the office tomorrow, but will ask a staff member to pull a card for him tomorrow afternoon and put up front that he can just pick up next week when he feels up to it. The patient voices understanding and is agreeable.  He was very appreciative of the call back.

## 2022-02-02 NOTE — Telephone Encounter (Signed)
Patients state the card he picked up from our office to get the medication. Has expire (2022) calling to see if he can get another one that is current. Please advise

## 2022-02-03 ENCOUNTER — Encounter: Admit: 2022-02-03 | Discharge: 2022-02-03 | Payer: MEDICARE

## 2022-02-03 ENCOUNTER — Ambulatory Visit: Admit: 2022-02-03 | Discharge: 2022-02-03 | Payer: MEDICARE

## 2022-02-03 NOTE — Telephone Encounter (Signed)
Updated 30 day free card left at the front desk for the patient to pick up.

## 2022-02-07 DIAGNOSIS — R1310 Dysphagia, unspecified: Principal | ICD-10-CM

## 2022-02-08 ENCOUNTER — Other Ambulatory Visit
Admission: RE | Admit: 2022-02-08 | Discharge: 2022-02-08 | Disposition: A | Discharge status: Home / Self Care | Payer: Medicare Other | Attending: Cardiovascular Disease | Admitting: Cardiovascular Disease

## 2022-02-08 DIAGNOSIS — I4892 Unspecified atrial flutter: Secondary | ICD-10-CM | POA: Insufficient documentation

## 2022-02-08 DIAGNOSIS — Z79899 Other long term (current) drug therapy: Secondary | ICD-10-CM | POA: Diagnosis present

## 2022-02-08 LAB — BASIC METABOLIC PANEL
Anion gap: 8 (ref 5–15)
BUN: 20 mg/dL (ref 8–23)
CO2: 29 mmol/L (ref 22–32)
Calcium: 9.4 mg/dL (ref 8.9–10.3)
Chloride: 100 mmol/L (ref 98–111)
Creatinine, Ser: 1.22 mg/dL (ref 0.61–1.24)
GFR, Estimated: >60 mL/min (ref >60)
Glucose, Bld: 101 mg/dL — ABNORMAL HIGH (ref 70–99)
Potassium: 4 mmol/L (ref 3.5–5.1)
Sodium: 137 mmol/L (ref 135–145)

## 2022-02-08 LAB — CBC
HCT: 40.9 % (ref 39.0–52.0)
Hemoglobin: 13.5 g/dL (ref 13.0–17.0)
MCH: 33.1 pg (ref 26.0–34.0)
MCHC: 33 g/dL (ref 30.0–36.0)
MCV: 100.2 fL — ABNORMAL HIGH (ref 80.0–100.0)
Platelets: 198 10*3/uL (ref 150–400)
RBC: 4.08 MIL/uL — ABNORMAL LOW (ref 4.22–5.81)
RDW: 12.5 % (ref 11.5–15.5)
WBC: 5.1 10*3/uL (ref 4.0–10.5)
nRBC: 0 % (ref 0.0–0.2)

## 2022-03-09 ENCOUNTER — Telehealth
Admit: 2022-03-09 | Discharge: 2022-03-10 | Payer: MEDICARE | Attending: Hematology & Oncology | Primary: Hematology & Oncology

## 2022-03-09 DIAGNOSIS — C61 Malignant neoplasm of prostate: Principal | ICD-10-CM

## 2022-03-09 DIAGNOSIS — C778 Secondary and unspecified malignant neoplasm of lymph nodes of multiple regions: Principal | ICD-10-CM

## 2022-04-14 DIAGNOSIS — C61 Malignant neoplasm of prostate: Principal | ICD-10-CM

## 2022-04-14 MED ORDER — BICALUTAMIDE 50 MG TABLET
ORAL_TABLET | 11 refills | 0 days | Status: CP
Start: 2022-04-14 — End: ?

## 2022-04-17 ENCOUNTER — Telehealth: Payer: Self-pay | Admitting: Cardiovascular Disease

## 2022-04-17 MED ORDER — APIXABAN 2.5 MG PO TABS: 2.5000 mg | ORAL_TABLET | Freq: Two times a day (BID) | ORAL | 1 refills | Status: DC

## 2022-04-17 NOTE — Telephone Encounter (Signed)
Pt c/o medication issue:  1. Name of Medication:   apixaban (ELIQUIS) 2.5 MG TABS tablet    2. How are you currently taking this medication (dosage and times per day)?  Take 1 tablet (2.5 mg total) by mouth 2 (two) times daily.   3. Are you having a reaction (difficulty breathing--STAT)? no  4. What is your medication issue? Patient would like to speak to the nurse. Please advise

## 2022-04-17 NOTE — Telephone Encounter (Signed)
I spoke with the patient. He states he is tolerating Eliquis 2.5 mg BID very well.  However, he just went in the donut hole.  He spoke with [facility] and they advised him to call [facility] for a waiver. Per the patient, [facility] is sending the waiver to [facility] and they will fill for 30-days.  He also needed a 90-day RX sent to [facility] for them to fill after the 30-day waiver expires.   The patient confirms [facility] should have his RX ready, but he will not go over until the morning.  I advised him if he has any issues getting his RX, to please call or send a MyChart message and I will pull samples for him as he is currently out of Eliquis.  The patient voices understanding and is agreeable.  RX for Eliquis 2.5 mg BID sent to [facility].

## 2022-04-18 ENCOUNTER — Other Ambulatory Visit: Payer: Self-pay | Admitting: Cardiovascular Disease

## 2022-04-19 ENCOUNTER — Encounter: Payer: Self-pay | Admitting: Urology

## 2022-04-19 ENCOUNTER — Ambulatory Visit (INDEPENDENT_AMBULATORY_CARE_PROVIDER_SITE_OTHER): Payer: Medicare Other | Admitting: Urology

## 2022-04-19 VITALS — BP 143/90 | HR 89 | Ht 70.0 in | Wt 148.0 lb

## 2022-04-19 DIAGNOSIS — R310 Gross hematuria: Secondary | ICD-10-CM | POA: Diagnosis not present

## 2022-04-19 DIAGNOSIS — C61 Malignant neoplasm of prostate: Secondary | ICD-10-CM | POA: Diagnosis not present

## 2022-04-20 ENCOUNTER — Encounter: Payer: Self-pay | Admitting: Urology

## 2022-04-20 LAB — URINALYSIS, COMPLETE
Bilirubin, UA: NEGATIVE
Glucose, UA: NEGATIVE
Ketones, UA: NEGATIVE
Leukocytes,UA: NEGATIVE
Nitrite, UA: NEGATIVE
Specific Gravity, UA: 1.025 (ref 1.005–1.030)
Urobilinogen, Ur: 0.2 mg/dL (ref 0.2–1.0)
pH, UA: 5 (ref 5.0–7.5)

## 2022-04-20 LAB — MICROSCOPIC EXAMINATION: RBC, Urine: >30 /hpf — ABNORMAL HIGH (ref 0–2)

## 2022-04-20 NOTE — Progress Notes (Signed)
04/19/2022 5:34 PM   Ryan Allison 04-24-1941 [id number]  Referring provider: Adin Hector, MD [facility] [place],  [place] [address]  Chief Complaint  Patient presents with   Hematuria    Urologic history:   1.  Lower urinary tract symptoms Obstructive voiding symptoms Orthostatic changes with tamsulosin; no improvement silodosin Cystoscopy 03/11/2020 with moderate lateral lobe enlargement with prominent hypervascularity Follow-up cystoscopy 11/18/2021 with similar findings   2.  Recurrent gross hematuria Cystoscopy as above CT 01/2019 with right nephrolithiasis   3.  Intermediate risk prostate cancer Followed [facility] oncology Initially treated EBRT + ADT 2011 Nodal mets via PET Axumin 2018 Started bicalutamide monotherapy with PSA 33.6; decreased to 0.3 on bicalutamide however this was stopped secondary to questionable side effects 2021 Currently followed [facility] oncology   HPI: 81 y.o. male presents for follow-up.  States intermittent gross hematuria markedly improved on titration of his Eliquis down to 2.5 mg twice daily Notes scant hematuria on occasion Stable lower urinary tract symptoms   PMH: Past Medical History:  Diagnosis Date   Aortic atherosclerosis ([place])    Arthritis    Atrial flutter ([place])    a. diagnosed 05/2018; b. not on [place] 2/2 recurrent life threatening GI bleed; c. CHADS2VASc => 3 (age x 2, vascular disease)   Cancer ([place]) 2011   prostate   GI bleed    History of echocardiogram    a. TTE 05/2018: EF of 60-65%, normal wall motion, mild MR, left atrium normal in size, RVSF normal, PASP 33 mmHg, rhythm was atrial flutter   History of GI diverticular bleed    extended stay in the [facility] ICU due to GI bleeding prior to 2016   HLD (hyperlipidemia)    Hyperlipidemia    Hypertension    Meniscus tear    OSA on CPAP    Peripheral neuropathy     Surgical History: Past Surgical History:  Procedure Laterality Date    COLONOSCOPY Left 06/07/2018   Procedure: COLONOSCOPY;  Surgeon: Virgel Manifold, MD;  Location: [facility] ENDOSCOPY;  Service: Endoscopy;  Laterality: Left;   COLONOSCOPY WITH PROPOFOL N/A 04/06/2015   Procedure: COLONOSCOPY WITH PROPOFOL;  Surgeon: Lollie Sails, MD;  Location[facility] ENDOSCOPY;  Service: Endoscopy;  Laterality: N/A;   COLONOSCOPY WITH PROPOFOL N/A 06/07/2018   Procedure: COLONOSCOPY WITH PROPOFOL;  Surgeon: Virgel Manifold, MD;  Location: [facility] ENDOSCOPY;  Service: Endoscopy;  Laterality: N/A;   HERNIA REPAIR Left 09-23-14   inguinal   INGUINAL HERNIA REPAIR Left 09/23/2014   Procedure: HERNIA REPAIR INGUINAL ADULT;  Surgeon: Christene Lye, MD;  Location: [facility] ORS;  Service: General;  Laterality: Left;   MENISCUS REPAIR  2008   VASCULAR SURGERY  2015    Home Medications:  Allergies as of 04/19/2022       Reactions   Nsaids Other (See Comments)   Diverticular bleed Diverticular bleed   Tolmetin    Other reaction(s): Other (See Comments) Diverticular bleed Diverticular bleed   Celecoxib Itching, Rash   Diltiazem Hcl Rash   Other Other (See Comments)   Diverticular bleed   Venlafaxine Other (See Comments)   sedation sedation        Medication List        Accurate as of April 19, 2022 11:59 PM. If you have any questions, ask your nurse or doctor.          acetaminophen 500 MG tablet Commonly known as: TYLENOL Take 500  mg by mouth daily as needed for fever or pain.   apixaban 2.5 MG Tabs tablet Commonly known as: Eliquis Take 1 tablet (2.5 mg total) by mouth 2 (two) times daily.   bicalutamide 50 MG tablet Commonly known as: CASODEX Take 50 mg by mouth daily.   cyanocobalamin 1000 MCG tablet Take 1,000 mcg by mouth daily.   gabapentin 600 MG tablet Commonly known as: NEURONTIN Take 600 mg by mouth 5 (five) times daily.   metoprolol tartrate 25 MG tablet Commonly known as: LOPRESSOR TAKE ONE-HALF (1/2) TABLET DAILY    simvastatin 20 MG tablet Commonly known as: ZOCOR Take 20 mg by mouth at bedtime.   traMADol 50 MG tablet Commonly known as: ULTRAM Take 50 mg by mouth every 6 (six) hours as needed for moderate pain.        Allergies:  Allergies  Allergen Reactions   Nsaids Other (See Comments)    Diverticular bleed Diverticular bleed   Tolmetin     Other reaction(s): Other (See Comments) Diverticular bleed Diverticular bleed   Celecoxib Itching and Rash   Diltiazem Hcl Rash   Other Other (See Comments)    Diverticular bleed   Venlafaxine Other (See Comments)    sedation sedation    Family History: Family History  Problem Relation Age of Onset   Dementia Mother    Cancer Father    Cancer Brother     Social History:  reports that he quit smoking about 52 years ago. His smoking use included cigarettes. He has been exposed to tobacco smoke. He has never used smokeless tobacco. He reports that he does not drink alcohol and does not use drugs.   Physical Exam: BP (!) 143/90   Pulse 89   Ht '5\' 10"'$  (1.778 m)   Wt 148 lb (67.1 kg)   BMI 21.24 kg/m   Constitutional:  Alert and oriented, No acute distress. HEENT: [place] AT Respiratory: Normal respiratory effort, no increased work of breathing. Psychiatric: Normal mood and affect.   Assessment & Plan:    1.  Gross hematuria Markedly improved on switching to Eliquis and decreasing dose UA today with >30 RBCs but grossly clear Follow-up 1 year and instructed to call earlier for recurrent gross hematuria  2. Prostate cancer Regular follow-up scheduled [place], MD  [place] [address], [place], [place] [address] [phone]

## 2022-04-23 ENCOUNTER — Encounter: Payer: Self-pay | Admitting: Urology

## 2022-05-30 DIAGNOSIS — E441 Mild protein-calorie malnutrition: Secondary | ICD-10-CM | POA: Insufficient documentation

## 2022-06-21 ENCOUNTER — Encounter: Payer: Self-pay | Admitting: Podiatry

## 2022-06-21 ENCOUNTER — Ambulatory Visit (INDEPENDENT_AMBULATORY_CARE_PROVIDER_SITE_OTHER): Payer: Medicare Other | Admitting: Podiatry

## 2022-06-21 DIAGNOSIS — D2371 Other benign neoplasm of skin of right lower limb, including hip: Secondary | ICD-10-CM

## 2022-06-21 DIAGNOSIS — G589 Mononeuropathy, unspecified: Secondary | ICD-10-CM | POA: Insufficient documentation

## 2022-06-21 DIAGNOSIS — D2372 Other benign neoplasm of skin of left lower limb, including hip: Secondary | ICD-10-CM | POA: Diagnosis not present

## 2022-06-21 DIAGNOSIS — F4312 Post-traumatic stress disorder, chronic: Secondary | ICD-10-CM | POA: Insufficient documentation

## 2022-06-21 DIAGNOSIS — K5731 Diverticulosis of large intestine without perforation or abscess with bleeding: Secondary | ICD-10-CM | POA: Insufficient documentation

## 2022-06-21 DIAGNOSIS — M4712 Other spondylosis with myelopathy, cervical region: Secondary | ICD-10-CM | POA: Insufficient documentation

## 2022-06-21 NOTE — Progress Notes (Signed)
Subjective:  Patient ID: Ryan Allison, male    DOB: [birth year],  MRN: [id number] HPI Chief Complaint  Patient presents with   Toe Pain    Plantar 3rd toe left and sub 5th MPJ right - callused areas x years, worse in the winter, very painful when walking    New Patient (Initial Visit)    Est pt 2018    82 y.o. male presents with the above complaint.   ROS: Denies fever chills nausea vomit muscle aches pains calf pain back pain chest pain shortness of breath.  Past Medical History:  Diagnosis Date   Aortic atherosclerosis ([place])    Arthritis    Atrial flutter ([place])    a. diagnosed 05/2018; b. not on [place] 2/2 recurrent life threatening GI bleed; c. CHADS2VASc => 3 (age x 2, vascular disease)   Cancer ([place]) 2011   prostate   GI bleed    History of echocardiogram    a. TTE 05/2018: EF of 60-65%, normal wall motion, mild MR, left atrium normal in size, RVSF normal, PASP 33 mmHg, rhythm was atrial flutter   History of GI diverticular bleed    extended stay in the [facility] ICU due to GI bleeding prior to 2016   HLD (hyperlipidemia)    Hyperlipidemia    Hypertension    Meniscus tear    OSA on CPAP    Peripheral neuropathy    Past Surgical History:  Procedure Laterality Date   COLONOSCOPY Left 06/07/2018   Procedure: COLONOSCOPY;  Surgeon: Virgel Manifold, MD;  Location: [facility] ENDOSCOPY;  Service: Endoscopy;  Laterality: Left;   COLONOSCOPY WITH PROPOFOL N/A 04/06/2015   Procedure: COLONOSCOPY WITH PROPOFOL;  Surgeon: Lollie Sails, MD;  Location: [facility] ENDOSCOPY;  Service: Endoscopy;  Laterality: N/A;   COLONOSCOPY WITH PROPOFOL N/A 06/07/2018   Procedure: COLONOSCOPY WITH PROPOFOL;  Surgeon: Virgel Manifold, MD;  Location: [facility] ENDOSCOPY;  Service: Endoscopy;  Laterality: N/A;   HERNIA REPAIR Left 09-23-14   inguinal   INGUINAL HERNIA REPAIR Left 09/23/2014   Procedure: HERNIA REPAIR INGUINAL ADULT;  Surgeon: Christene Lye, MD;  Location: [facility] ORS;  Service: General;   Laterality: Left;   MENISCUS REPAIR  2008   VASCULAR SURGERY  2015    Current Outpatient Medications:    acetaminophen (TYLENOL) 500 MG tablet, Take 500 mg by mouth daily as needed for fever or pain. , Disp: , Rfl:    apixaban (ELIQUIS) 2.5 MG TABS tablet, Take 1 tablet (2.5 mg total) by mouth 2 (two) times daily., Disp: 180 tablet, Rfl: 1   bicalutamide (CASODEX) 50 MG tablet, Take 50 mg by mouth daily., Disp: , Rfl:    cyanocobalamin 1000 MCG tablet, Take 1,000 mcg by mouth daily., Disp: , Rfl:    gabapentin (NEURONTIN) 600 MG tablet, Take 600 mg by mouth 5 (five) times daily. , Disp: , Rfl:    metoprolol tartrate (LOPRESSOR) 25 MG tablet, TAKE ONE-HALF (1/2) TABLET DAILY, Disp: 45 tablet, Rfl: 3   simvastatin (ZOCOR) 20 MG tablet, Take 20 mg by mouth at bedtime. , Disp: , Rfl:    traMADol (ULTRAM) 50 MG tablet, Take 50 mg by mouth every 6 (six) hours as needed for moderate pain. , Disp: , Rfl:   Allergies  Allergen Reactions   Nsaids Other (See Comments)    Diverticular bleed Diverticular bleed   Tolmetin     Other reaction(s): Other (See Comments) Diverticular bleed Diverticular bleed   Celecoxib Itching and Rash  Diltiazem Hcl Rash   Other Other (See Comments)    Diverticular bleed   Venlafaxine Other (See Comments)    sedation sedation   Review of Systems Objective:  There were no vitals filed for this visit.  General: Well developed, nourished, in no acute distress, alert and oriented x3   Dermatological: Skin is warm, dry and supple bilateral. Nails x 10 are well maintained; remaining integument appears unremarkable at this time. There are no open sores, no preulcerative lesions, no rash or signs of infection present.  Vascular: Dorsalis Pedis artery and Posterior Tibial artery pedal pulses are 2/4 bilateral with immedate capillary fill time. Pedal hair growth present. No varicosities and no lower extremity edema present bilateral.   Neruologic: Grossly intact via  light touch bilateral. Vibratory intact via tuning fork bilateral. Protective threshold with Semmes Wienstein monofilament intact to all pedal sites bilateral. Patellar and Achilles deep tendon reflexes 2+ bilateral. No Babinski or clonus noted bilateral.   Musculoskeletal: No gross boney pedal deformities bilateral. No pain, crepitus, or limitation noted with foot and ankle range of motion bilateral. Muscular strength 5/5 in all groups tested bilateral.  Digital deformities resulting in benign reactive hyper keratomas plantar aspect of the bilateral foot particularly the second and third toes of the left foot and beneath the fifth metatarsal head bilaterally.  Gait: Unassisted, Nonantalgic.    Radiographs:  None taken  Assessment & Plan:   Assessment: Painful benign skin lesion secondary to digital deformities  Plan: Debridement of benign skin lesions.     Kenzee Bassin T. [place], [place]

## 2022-07-03 DIAGNOSIS — C61 Malignant neoplasm of prostate: Principal | ICD-10-CM

## 2022-07-06 ENCOUNTER — Telehealth
Admit: 2022-07-06 | Discharge: 2022-07-07 | Payer: MEDICARE | Attending: Hematology & Oncology | Primary: Hematology & Oncology

## 2022-07-06 DIAGNOSIS — C778 Secondary and unspecified malignant neoplasm of lymph nodes of multiple regions: Principal | ICD-10-CM

## 2022-07-06 DIAGNOSIS — C61 Malignant neoplasm of prostate: Principal | ICD-10-CM

## 2022-08-09 ENCOUNTER — Ambulatory Visit: Payer: Medicare Other | Attending: Cardiovascular Disease | Admitting: Cardiovascular Disease

## 2022-08-09 ENCOUNTER — Encounter: Payer: Self-pay | Admitting: Cardiovascular Disease

## 2022-08-09 VITALS — BP 130/80 | Resp 68 | Ht 69.0 in | Wt 150.4 lb

## 2022-08-09 DIAGNOSIS — E782 Mixed hyperlipidemia: Secondary | ICD-10-CM | POA: Insufficient documentation

## 2022-08-09 DIAGNOSIS — I4892 Unspecified atrial flutter: Secondary | ICD-10-CM | POA: Insufficient documentation

## 2022-08-09 DIAGNOSIS — G4733 Obstructive sleep apnea (adult) (pediatric): Secondary | ICD-10-CM | POA: Insufficient documentation

## 2022-08-09 DIAGNOSIS — I700 Atherosclerosis of aorta: Secondary | ICD-10-CM | POA: Diagnosis not present

## 2022-08-09 NOTE — Progress Notes (Signed)
Date:  08/09/2022   ID:  Kershaw, Sphar 1940-06-09, MRN [id number]  Patient Location:  [place] [id number]   Provider location:   [facility], [place] office  PCP:  Adin Hector, MD  Cardiologist:  [facility]   Chief Complaint  Patient presents with   Follow-up    6 month f/u. Patient denies new problems/concerns today. Medications reviewed verbally    History of Present Illness:    Ryan Allison is a 82 y.o. male  past medical history of atrial flutter of uncertain chronicity in 05/2018 not on full dose anticoagulation secondary to prior life threatening GI bleeds,  recurrent prostate cancer , PET scan from 04/2018 showing no metastatic disease,  aortic atherosclerosis,  HLD,  OSA on CPAP,  diverticulosis,  peripheral neuropathy,  prior tobacco abuse,  GERD In the hospital  from 06/06/2018 to 06/08/18 for GI bleed and  atrial flutter Persistent atrial fibrillation January 2020, Who presents for atrial flutter, shortness of breath/diastolic CHF, weakness  Seen by myself in clinic July 2023 On today's visit reports legs getting weaker, weight remains very low but stable in the past 3 months Feels more palpitations, fluttering Previously declined cardioversion Remains in atrial flutter We previously recommended Xarelto 15 mg daily, changed to Eliquis 2.5 twice daily reduced dose secondary to hematuria (though he does not currently meet criteria for low-dose) On prior clinic visit reported having bleeding urine and rectal/GI On xarelto 20 mg daily Bleeding has now resolved  Dizzy on high dose metoprolol 25 in the past More SOB recently No regular exercise program  EKG reviewed Atrial flutter rate 2021 Normal sinus rhythm November 2021 Normal sinus rhythm May 2022 Atrial flutter February 2023 Atrial flutter July 2023 Atrial flutter today August 09, 2022  Followed by urology for hematuria, completed  cystoscopy  Still with ostomy Weight stable, "a challenge" to maintain 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  Lab work reviewed HGB 14  muscle atrophy,leg weakness, some gait instability  EKG personally reviewed by myself on todays visit Atrial flutter rate 68 bpm  Other past medical history reviewed Colectomy at [facility] February 2020 Started on Eliquis 5 twice daily, no recurrent bleeding Was only taking metoprolol once a day Previous office visits not interested in cardioversion  Echocardiogram Sep 19, 2019,   ejection fraction 50 to 55% Mildly dilated left atrium Rhythm is atrial flutter Mildly elevated right heart pressures   [facility] on 06/06/18 following 2 episodes of BRBPR with associated presyncope, SOB, and palpitations.   in atrial flutter with RVR.   transfusion of pRBC secondary to blood loss. He was rate controlled.   not able to be placed on anticoagulation given recurrent GI bleeding.   colonoscopy on 06/07/2018 that showed possible evidence of diverticular bleed in the sigmoid colon, diverticulosis of the transverse, ascending, and sigmoid colon as well as radiation proctitis. It was recommended he follow up with a colorectal surgeon for possible colectomy due to recurrent diverticular bleed.   Echo during his admission on 06/07/2018 showed an EF of 60-65%, normal wall motion, mild MR, left atrium normal in size, RVSF normal, PASP 33 mmHg, rhythm was atrial flutter.   Seen by PCP on 06/11/2018, EKG showed he remained in atrial flutter  PCP again on 06/17/2018 with a rash that started underneath his axilla, though spread diffusely. He had already self discontinued Cardizem with some improvement in rash. He was placed on metoprolol for rate  control of his atrial flutter.  Continues on metoprolol  Prior to GI surgery, had no sx of SOB from his atrial flutter Underwent colectomy at [facility], for diverticuli and GI bleeding, 07/12/18 In follow up had SBO, at [facility] x 1 week Ostomy output has been  good.  Drinking Gatorade to stay hydrated  Echo 05/2018 - Left ventricle: The cavity size was normal. Systolic function was   normal. The estimated ejection fraction was in the range of 60%   to 65%. Wall motion was normal; there were no regional wall   motion abnormalities. - Mitral valve: There was mild regurgitation. - Left atrium: The atrium was normal in size. - Right ventricle: Systolic function was normal. - Pulmonary arteries: Systolic pressure was within the normal   range. PA peak pressure: 33 mm Hg (S).   Impressions: - Rhythm is atrial flutter.    Past Medical History:  Diagnosis Date   Aortic atherosclerosis ([place])    Arthritis    Atrial flutter ([place])    a. diagnosed 05/2018; b. not on [place] 2/2 recurrent life threatening GI bleed; c. CHADS2VASc => 3 (age x 2, vascular disease)   Cancer ([place]) 2011   prostate   GI bleed    History of echocardiogram    a. TTE 05/2018: EF of 60-65%, normal wall motion, mild MR, left atrium normal in size, RVSF normal, PASP 33 mmHg, rhythm was atrial flutter   History of GI diverticular bleed    extended stay in the [facility] ICU due to GI bleeding prior to 2016   HLD (hyperlipidemia)    Hyperlipidemia    Hypertension    Meniscus tear    OSA on CPAP    Peripheral neuropathy    Past Surgical History:  Procedure Laterality Date   COLONOSCOPY Left 06/07/2018   Procedure: COLONOSCOPY;  Surgeon: Virgel Manifold, MD;  Location: [facility] ENDOSCOPY;  Service: Endoscopy;  Laterality: Left;   COLONOSCOPY WITH PROPOFOL N/A 04/06/2015   Procedure: COLONOSCOPY WITH PROPOFOL;  Surgeon: Lollie Sails, MD;  Location: [facility] ENDOSCOPY;  Service: Endoscopy;  Laterality: N/A;   COLONOSCOPY WITH PROPOFOL N/A 06/07/2018   Procedure: COLONOSCOPY WITH PROPOFOL;  Surgeon: Virgel Manifold, MD;  Location: [facility] ENDOSCOPY;  Service: Endoscopy;  Laterality: N/A;   HERNIA REPAIR Left 09-23-14   inguinal   INGUINAL HERNIA REPAIR Left 09/23/2014   Procedure:  HERNIA REPAIR INGUINAL ADULT;  Surgeon: Christene Lye, MD;  Location: [facility] ORS;  Service: General;  Laterality: Left;   MENISCUS REPAIR  2008   VASCULAR SURGERY  2015     Current Meds  Medication Sig   acetaminophen (TYLENOL) 500 MG tablet Take 500 mg by mouth daily as needed for fever or pain.    apixaban (ELIQUIS) 2.5 MG TABS tablet Take 1 tablet (2.5 mg total) by mouth 2 (two) times daily.   bicalutamide (CASODEX) 50 MG tablet Take 50 mg by mouth daily.   cyanocobalamin 1000 MCG tablet Take 1,000 mcg by mouth daily.   gabapentin (NEURONTIN) 600 MG tablet Take 600 mg by mouth 5 (five) times daily.    metoprolol tartrate (LOPRESSOR) 25 MG tablet TAKE ONE-HALF (1/2) TABLET DAILY   simvastatin (ZOCOR) 20 MG tablet Take 20 mg by mouth at bedtime.    traMADol (ULTRAM) 50 MG tablet Take 50 mg by mouth every 6 (six) hours as needed for moderate pain.      Allergies:   Nsaids, Tolmetin, Celecoxib, Diltiazem hcl, Other, and Venlafaxine   Social History  Tobacco Use   Smoking status: Former    Types: Cigarettes    Quit date: 05/15/1969    Years since quitting: 53.2    Passive exposure: Past   Smokeless tobacco: Never  Vaping Use   Vaping Use: Never used  Substance Use Topics   Alcohol use: No   Drug use: No     Family Hx: The patient's family history includes Cancer in his brother and father; Dementia in his mother.  ROS:   Please see the history of present illness.    Review of Systems  Constitutional:  Positive for malaise/fatigue.  Respiratory:  Positive for shortness of breath.   Cardiovascular: Negative.   Gastrointestinal: Negative.   Musculoskeletal: Negative.   Neurological: Negative.   Psychiatric/Behavioral: Negative.    All other systems reviewed and are negative.   Labs/Other Tests and Data Reviewed:    Recent Labs: 02/08/2022: BUN 20; Creatinine, Ser 1.22; Hemoglobin 13.5; Platelets 198; Potassium 4.0; Sodium 137   Recent Lipid Panel No results  found for: "CHOL", "TRIG", "HDL", "CHOLHDL", "LDLCALC", "LDLDIRECT"  Wt Readings from Last 3 Encounters:  08/09/22 150 lb 6.4 oz (68.2 kg)  04/19/22 148 lb (67.1 kg)  12/06/21 158 lb 4 oz (71.8 kg)     Exam:    BP 130/80 (BP Location: Left Arm, Patient Position: Sitting, Cuff Size: Normal)   Resp (!) 68   Ht 5\' 9"  (1.753 m)   Wt 150 lb 6.4 oz (68.2 kg)   SpO2 96%   BMI 22.21 kg/m  Constitutional:  oriented to person, place, and time. No distress.  HENT:  Head: Grossly normal Eyes:  no discharge. No scleral icterus.  Neck: No JVD, no carotid bruits  Cardiovascular: Irregularly irregular no murmurs appreciated Pulmonary/Chest: Clear to auscultation bilaterally, no wheezes or rails Abdominal: Soft.  no distension.  no tenderness.  Musculoskeletal: Normal range of motion Neurological:  normal muscle tone. Coordination normal. No atrophy Skin: Skin warm and dry Psychiatric: normal affect, pleasant  ASSESSMENT & PLAN:    Atrial flutter, typical Noted February 2023, has persisted since that time, rate controlled Discussed previous treatment options with him, he is tolerating Eliquis 2.5 twice daily which is below guidelines but higher dose causing hematuria Also with hematuria on Xarelto For symptoms of palpitations, we have recommended increasing metoprolol to tartrate to 12.5 twice daily On further discussion, he is again declining cardioversion Echocardiogram ordered given his fatigue  Chronic diastolic CHF Appears euvolemic Weight low  Aortic atherosclerosis ([place]) Cholesterol at goal  OSA on CPAP Managed by primary care  Weight loss/weakness Working with nutritionist, weight stable past 3 months Weak, muscle atrophy in the legs, recommend walking program May benefit from SSRI, appetite stimulant/remeron  Long discussion concerning weight loss, weakness, management of atrial flutter  Total encounter time more than 40 minutes  Greater than 50% was spent in  counseling and coordination of care with the patient  Signed, Ida Rogue, MD  08/09/2022 11:24 AM    [place] Office [address] [place] [address], [place], [place] [address]

## 2022-08-09 NOTE — Patient Instructions (Addendum)
Echo for SOB, atrial flutter  Consider starting Vitamin D3,  Ask Dr. Caryl Comes to check a level  Medication Instructions:  Please increase the metoprolol up to 1/2 pill twice a day (12.5 mg twice a day)  If you need a refill on your cardiac medications before your next appointment, please call your pharmacy.   Lab work: No new labs needed  Testing/Procedures: Your physician has requested that you have an echocardiogram. Echocardiography is a painless test that uses sound waves to create images of your heart. It provides your doctor with information about the size and shape of your heart and how well your heart's chambers and valves are working. This procedure takes approximately one hour. There are no restrictions for this procedure. Please do NOT wear cologne, perfume, aftershave, or lotions (deodorant is allowed). Please arrive 15 minutes prior to your appointment time.   Follow-Up: At [facility], you and your health needs are our priority.  As part of our continuing mission to provide you with exceptional heart care, we have created designated Provider Care Teams.  These Care Teams include your primary Cardiologist (physician) and Advanced Practice Providers (APPs -  Physician Assistants and Nurse Practitioners) who all work together to provide you with the care you need, when you need it.  You will need a follow up appointment in 6 months  Providers on your designated Care Team:   Murray Hodgkins, NP Christell Faith, PA-C Cadence Kathlen Mody, [place]  COVID-19 Vaccine Information can be found at: [email or web address] For questions related to vaccine distribution or appointments, please email [email or web address] or call [phone].

## 2022-08-28 ENCOUNTER — Other Ambulatory Visit: Payer: Self-pay | Admitting: Cardiovascular Disease

## 2022-09-06 ENCOUNTER — Ambulatory Visit (INDEPENDENT_AMBULATORY_CARE_PROVIDER_SITE_OTHER): Payer: Medicare Other | Admitting: Podiatrist

## 2022-09-06 ENCOUNTER — Ambulatory Visit: Payer: Medicare Other | Admitting: Podiatry

## 2022-09-06 ENCOUNTER — Telehealth: Payer: Self-pay | Admitting: Podiatrist

## 2022-09-06 ENCOUNTER — Encounter: Payer: Self-pay | Admitting: Podiatrist

## 2022-09-06 DIAGNOSIS — D2372 Other benign neoplasm of skin of left lower limb, including hip: Secondary | ICD-10-CM | POA: Diagnosis not present

## 2022-09-06 DIAGNOSIS — D2371 Other benign neoplasm of skin of right lower limb, including hip: Secondary | ICD-10-CM | POA: Diagnosis not present

## 2022-09-06 DIAGNOSIS — M2042 Other hammer toe(s) (acquired), left foot: Secondary | ICD-10-CM

## 2022-09-06 NOTE — Telephone Encounter (Signed)
Pt forgot the names of the medication that he was suppose to use for his feet that over the counter.   Please advise

## 2022-09-06 NOTE — Progress Notes (Signed)
Chief Complaint  Patient presents with   Callouses    Trim calluses bilateral      HPI: Patient is 82 y.o. male who presents today for pain at the distal tip of the left third toe due to a thick and painful skin lesion that continues to develop.  He relates he has been coming in for debridements of the lesion, however, the toe is only feeling better for about 2 weeks before it gets sore again.  He denies any signs of infection. No redness, no drainage noted.  Pain with pressure in shoes is reported.     Allergies  Allergen Reactions   Nsaids Other (See Comments)    Diverticular bleed Diverticular bleed   Tolmetin     Other reaction(s): Other (See Comments) Diverticular bleed Diverticular bleed   Celecoxib Itching and Rash   Diltiazem Hcl Rash   Other Other (See Comments)    Diverticular bleed   Venlafaxine Other (See Comments)    sedation sedation    Review of systems is negative except as noted in the HPI.  Denies nausea/ vomiting/ fevers/ chills or night sweats.   Denies difficulty breathing, denies calf pain or tenderness  Physical Exam  Patient is awake, alert, and oriented x 3.  In no acute distress.    Vascular status is intact with palpable pedal pulses DP and PT bilateral and capillary refill time less than 3 seconds bilateral.  No edema or erythema noted.   Neurological exam reveals epicritic and protective sensation grossly intact bilateral.   Dermatological exam reveals reactive keratoma's plantar left third toe as well as submetatarsal 5 right foot.  Pain on palpation left third toe is noted.  No redness, drainage or sign of infection noted.   Musculoskeletal exam: digital contractures noted left foot causing the tip of the left third to toe to purchase the ground more than the second and fourth toes.  Prominent metatarsal head 5 right noted.     Assessment:   ICD-10-CM   1. Benign neoplasm of skin of left foot  D23.72     2. Benign neoplasm of skin of right  foot  D23.71     3. Hammertoe of left foot  M20.42        Plan: Discussed exam findings and treatment recommendations.  I pared the benign lesions to a safe level with a 15 blade x 2.  He tolerated this well.  I also dispensed forefoot silicone padding as well as a toe sleeve to try on his left third toe.  We briefly discussed surgical intervention may be considered if his problem persists.  Discussed an amputation could potentially be considered  but he would need to discuss further with a surgeon.  His wife had one of her toes amputated for a similar problem and has done very well.  He will be re appointed for 3 months and will see how he is doing with conservative care.

## 2022-09-14 ENCOUNTER — Ambulatory Visit: Payer: Medicare Other | Attending: Cardiovascular Disease

## 2022-09-14 DIAGNOSIS — I4892 Unspecified atrial flutter: Secondary | ICD-10-CM | POA: Insufficient documentation

## 2022-09-14 LAB — ECHOCARDIOGRAM COMPLETE
AR max vel: 2.35 cm2
AV Area VTI: 2.27 cm2
AV Area mean vel: 2.24 cm2
AV Mean grad: 3 mmHg
AV Peak grad: 5.8 mmHg
Ao pk vel: 1.21 m/s
Area-P 1/2: 5.2 cm2
Calc EF: 53.2 %
S' Lateral: 1.9 cm
Single Plane A2C EF: 61.7 %
Single Plane A4C EF: 46.8 %

## 2022-10-03 ENCOUNTER — Telehealth
Admit: 2022-10-03 | Discharge: 2022-10-04 | Payer: MEDICARE | Attending: Hematology & Oncology | Primary: Hematology & Oncology

## 2022-10-03 DIAGNOSIS — C778 Secondary and unspecified malignant neoplasm of lymph nodes of multiple regions: Principal | ICD-10-CM

## 2022-10-03 DIAGNOSIS — C61 Malignant neoplasm of prostate: Principal | ICD-10-CM

## 2022-10-04 ENCOUNTER — Ambulatory Visit (INDEPENDENT_AMBULATORY_CARE_PROVIDER_SITE_OTHER): Payer: Medicare Other | Admitting: Podiatry

## 2022-10-04 ENCOUNTER — Encounter: Payer: Self-pay | Admitting: Podiatry

## 2022-10-04 ENCOUNTER — Ambulatory Visit (INDEPENDENT_AMBULATORY_CARE_PROVIDER_SITE_OTHER): Payer: Medicare Other

## 2022-10-04 DIAGNOSIS — M7752 Other enthesopathy of left foot: Secondary | ICD-10-CM | POA: Diagnosis not present

## 2022-10-04 DIAGNOSIS — D2371 Other benign neoplasm of skin of right lower limb, including hip: Secondary | ICD-10-CM | POA: Diagnosis not present

## 2022-10-04 DIAGNOSIS — D2372 Other benign neoplasm of skin of left lower limb, including hip: Secondary | ICD-10-CM | POA: Diagnosis not present

## 2022-10-04 MED ORDER — DEXAMETHASONE SODIUM PHOSPHATE 120 MG/30ML IJ SOLN
2.0000 mg | Freq: Once | INTRAMUSCULAR | Status: AC
  Administered 2022-10-04: 2 mg via INTRA_ARTICULAR

## 2022-10-04 NOTE — Progress Notes (Signed)
She presents today complaining of a callus to the plantar aspect of the PIPJ third digit left foot.  States that is really painful makes the forefoot hurt.  He saw Dr. Llana Aliment last visit who helped to some degree.  Objective: Vital signs are stable alert and oriented x 3 there is no erythema edema salines drainage or he does have severe pain on palpation of the plantar aspect of the third proximal phalanx head left foot with a reactive hyperkeratotic lesion and what appears to be bursitis beneath it.  Assessment: Bursitis and painful callus plantar aspect third digit.  Plan: I injected the area today debrided it placed padding will follow-up with him in 1 month

## 2022-10-18 ENCOUNTER — Ambulatory Visit (INDEPENDENT_AMBULATORY_CARE_PROVIDER_SITE_OTHER): Payer: Medicare Other | Admitting: Podiatry

## 2022-10-18 DIAGNOSIS — M216X2 Other acquired deformities of left foot: Secondary | ICD-10-CM | POA: Diagnosis not present

## 2022-10-18 DIAGNOSIS — M2042 Other hammer toe(s) (acquired), left foot: Secondary | ICD-10-CM

## 2022-10-18 NOTE — Progress Notes (Signed)
  Subjective:  Patient ID: GRAISEN BERGEMAN, male    DOB: [birth year],  MRN: [id number]  Pain in toe of left foot and ball of foot  82 y.o. male presents with the above complaint. History confirmed with patient.  He is referred to me by Dr. Al Corpus, he has had a chronic painful callus on the third toe, intermittent debridement has helped only temporarily and pain returns quite quickly.  He also has pain on the ball of the foot as well.  Objective:  Physical Exam: warm, good capillary refill, no trophic changes or ulcerative lesions, normal DP and PT pulses, normal sensory exam, and prominent callus at base of third toe at the plantar proximal phalanx head, thinning of plantar fat pad with tenderness on the third and fourth metatarsals.   Radiographs: Multiple views x-ray of the left foot: no fracture, dislocation, swelling or degenerative changes noted Assessment:   1. Hammer toe of left foot   2. 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  We reviewed his progress, I reviewed his last radiographs as well as my clinical exam findings with the patient and his wife today.  So far has had much relief with intermittent debridement and offloading padding for the painful callus.  Most of this is plantar to the proximal phalanx head.  We discussed surgical treatment options.  I recommended resection of the proximal phalanx head with arthroplasty of the joint.  Should believe the bony prominence.  He also has plantar fat pad atrophy and prominence of the metatarsal heads with tenderness here as well.  Discussed application of adipose tissue matrix to encourage fat pad ingrowth.  We discussed the risk benefits indigent patients of the above procedures.  All questions addressed.  Informed consent signed and reviewed.  Surgical be scheduled at the hospital due to his cardiac history and his personal preference, he will need cardiology  clearance and recommendations on holding his Eliquis prior to surgery, he will be able to restart his Eliquis immediately following surgery.   Surgical plan:  Procedure: -Arthroplasty left third toe, injection of [place] adipose matrix  Location: -[facility]  Anesthesia plan: -MAC with local  Postoperative pain plan: - Tylenol 1000 mg every 6 hours, gabapentin 300 mg every 8 hours x5 days, oxycodone 5 mg 1-2 tabs every 6 hours only as needed  DVT prophylaxis: -None required he will restart his Eliquis postop  WB Restrictions / DME needs: -WBAT in surgical shoe postop visit   No follow-ups on file.

## 2022-10-19 ENCOUNTER — Telehealth: Payer: Self-pay | Admitting: Cardiovascular Disease

## 2022-10-19 NOTE — Telephone Encounter (Signed)
   Pre-operative Risk Assessment    Patient Name: Ryan Allison  DOB: 06-13-1940 MRN: [id number]      Request for Surgical Clearance    Procedure:   Hammer toe repair, third toe, left foot   Date of Surgery:  Clearance 01/05/23                                 Surgeon:  Dr. Lilian Kapur  Surgeon's Group or Practice Name:  [facility]  Phone number:  [phone] Fax number:  [phone]   Type of Clearance Requested:   - Medical  - Pharmacy:  Hold "please let us know if anything needs to be held"      Type of Anesthesia:  MAC   Additional requests/questions:    [name]   10/19/2022, 4:05 PM

## 2022-10-20 ENCOUNTER — Telehealth: Payer: Self-pay | Admitting: *Deleted

## 2022-10-20 NOTE — Telephone Encounter (Signed)
Left message to call back to set up tele pre op appt.  

## 2022-10-20 NOTE — Telephone Encounter (Signed)
Patient with diagnosis of A Fib on Eliquis for anticoagulation.    Procedure:  Ryan Allison repair, third toe, left foot  Date of procedure: 01/05/23   CHA2DS2-VASc Score = 5  This indicates a 7.2% annual risk of stroke. The patient's score is based upon: CHF History: 1 HTN History: 1 Diabetes History: 0 Stroke History: 0 Vascular Disease History: 1 Age Score: 2 Gender Score: 0   CrCl 58 mL/min Platelet count 196K   Per office protocol, patient can hold Eliquis for 2 days prior to procedure.     **This guidance is not considered finalized until pre-operative APP has relayed final recommendations.**

## 2022-10-20 NOTE — Telephone Encounter (Signed)
   Name: Ryan Allison  DOB: 11/21/1940  MRN: [id number]  Primary Cardiologist: [name], MD  Chart reviewed as part of pre-operative protocol coverage. Because of [name]'s past medical history and time since last visit, he will require a follow-up telephone visit in order to better assess preoperative cardiovascular risk.  Pre-op covering staff: - Please schedule appointment and call patient to inform them. If patient already had an upcoming appointment within acceptable timeframe, please add "pre-op clearance" to the appointment notes so provider is aware. - Please contact requesting surgeon's office via preferred method (i.e, phone, fax) to inform them of need for appointment prior to surgery.   Per office protocol, patient can hold Eliquis for 2 days prior to procedure.   [name], PA-C  10/20/2022, 11:55 AM

## 2022-10-20 NOTE — Telephone Encounter (Signed)
Pt called back and has been scheduled for tele pre op appt 12/15/22 @ 9:20. Pt did say there might be a chance they might be able to move surgery up sooner, but he will let us know. Med rec and consent are done.     Patient Consent for Virtual Visit        Ryan Allison has provided verbal consent on 10/20/2022 for a virtual visit (video or telephone).   CONSENT FOR VIRTUAL VISIT FOR:  Ryan Allison  By participating in this virtual visit I agree to the following:  I hereby voluntarily request, consent and authorize [facility] and its employed or contracted physicians, physician assistants, nurse practitioners or other licensed health care professionals (the Practitioner), to provide me with telemedicine health care services (the "Services") as deemed necessary by the treating Practitioner. I acknowledge and consent to receive the Services by the Practitioner via telemedicine. I understand that the telemedicine visit will involve communicating with the Practitioner through live audiovisual communication technology and the disclosure of certain medical information by electronic transmission. I acknowledge that I have been given the opportunity to request an in-person assessment or other available alternative prior to the telemedicine visit and am voluntarily participating in the telemedicine visit.  I understand that I have the right to withhold or withdraw my consent to the use of telemedicine in the course of my care at any time, without affecting my right to future care or treatment, and that the Practitioner or I may terminate the telemedicine visit at any time. I understand that I have the right to inspect all information obtained and/or recorded in the course of the telemedicine visit and may receive copies of available information for a reasonable fee.  I understand that some of the potential risks of receiving the Services via telemedicine include:  Delay or interruption in medical  evaluation due to technological equipment failure or disruption; Information transmitted may not be sufficient (e.g. poor resolution of images) to allow for appropriate medical decision making by the Practitioner; and/or  In rare instances, security protocols could fail, causing a breach of personal health information.  Furthermore, I acknowledge that it is my responsibility to provide information about my medical history, conditions and care that is complete and accurate to the best of my ability. I acknowledge that Practitioner's advice, recommendations, and/or decision may be based on factors not within their control, such as incomplete or inaccurate data provided by me or distortions of diagnostic images or specimens that may result from electronic transmissions. I understand that the practice of medicine is not an exact science and that Practitioner makes no warranties or guarantees regarding treatment outcomes. I acknowledge that a copy of this consent can be made available to me via my patient portal [facility] MyChart), or I can request a printed copy by calling the office of [facility].    I understand that my insurance will be billed for this visit.   I have read or had this consent read to me. I understand the contents of this consent, which adequately explains the benefits and risks of the Services being provided via telemedicine.  I have been provided ample opportunity to ask questions regarding this consent and the Services and have had my questions answered to my satisfaction. I give my informed consent for the services to be provided through the use of telemedicine in my medical care

## 2022-10-20 NOTE — Telephone Encounter (Signed)
Pt called back and has been scheduled for tele pre op appt 12/15/22 @ 9:20. Pt did say there might be a chance they might be able to move surgery up sooner, but he will let us know. Med rec and consent are done.

## 2022-11-13 ENCOUNTER — Ambulatory Visit (INDEPENDENT_AMBULATORY_CARE_PROVIDER_SITE_OTHER): Payer: Medicare Other | Admitting: Podiatry

## 2022-11-13 DIAGNOSIS — D2372 Other benign neoplasm of skin of left lower limb, including hip: Secondary | ICD-10-CM

## 2022-11-13 DIAGNOSIS — M2042 Other hammer toe(s) (acquired), left foot: Secondary | ICD-10-CM

## 2022-11-13 NOTE — Progress Notes (Signed)
He presents today for follow-up of painful third toe of his left foot.  He has discussed with Dr. Lilian Kapur the arthroplasty that is going to occur sometime in August.  He would like for me to trim the wound today.  Objective: Vital signs are stable alert oriented x 3 there is no erythema Dem salines drainage odor plantar aspect of the third digit at the level of the proximal interphalangeal joint does demonstrate benign reactive hyperkeratotic lesion with some bleeding beneath the lesion.  Once debrided does not demonstrate any type of open lesion or wound.  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  Plan: Debridement of the lesion today follow-up with him on an as-needed basis until surgery can be performed.

## 2022-11-26 ENCOUNTER — Other Ambulatory Visit: Payer: Self-pay | Admitting: Cardiovascular Disease

## 2022-12-07 ENCOUNTER — Telehealth: Payer: Self-pay | Admitting: Cardiovascular Disease

## 2022-12-07 ENCOUNTER — Encounter: Payer: Self-pay | Admitting: Podiatry

## 2022-12-07 NOTE — Telephone Encounter (Signed)
Patient has appointment scheduled for 12/15/2022 in the office.  Same clearance for Eliquis for both procedures which are planned for TBD.

## 2022-12-07 NOTE — Telephone Encounter (Signed)
   Pre-operative Risk Assessment    Patient Name: Ryan Allison  DOB: 1940-06-24 MRN: [id number]      Request for Surgical Clearance    Procedure:   benign neoplasm of the skin on his l foot  Date of Surgery:  Clearance TBD                                 Surgeon:  TBD Surgeon's Group or Practice Name:  [facility] Phone number:  [phone] Fax number:  [phone]   Type of Clearance Requested:   - Medical    Type of Anesthesia:  General    Additional requests/questions:    SignedShawna Orleans   12/07/2022, 3:23 PM

## 2022-12-14 DIAGNOSIS — M2042 Other hammer toe(s) (acquired), left foot: Secondary | ICD-10-CM

## 2022-12-14 HISTORY — DX: Other hammer toe(s) (acquired), left foot: M20.42

## 2022-12-15 ENCOUNTER — Ambulatory Visit: Payer: Medicare Other | Attending: Cardiovascular Disease | Admitting: Student

## 2022-12-15 DIAGNOSIS — Z01810 Encounter for preprocedural cardiovascular examination: Secondary | ICD-10-CM | POA: Diagnosis not present

## 2022-12-15 NOTE — Progress Notes (Signed)
Virtual Visit via Telephone Note   Because of Ryan Allison's co-morbid illnesses, Ryan Allison is at least at moderate risk for complications without adequate follow up.  This format is felt to be most appropriate for this patient at this time.  The patient did not have access to video technology/had technical difficulties with video requiring transitioning to audio format only (telephone).  All issues noted in this document were discussed and addressed.  No physical exam could be performed with this format.  Please refer to the patient's chart for his consent to telehealth for [facility].  Evaluation Performed:  Preoperative cardiovascular risk assessment _____________   Date:  12/15/2022   Patient ID:  Ryan Allison, Ryan Allison 20-May-1940, MRN [id number] Patient Location:  Home Provider location:   Office  Primary Care Provider:  Lynnea Ferrier, MD Primary Cardiologist:  [name], MD  Chief Complaint / Patient Profile   82 y.o. 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 Lilian Kapur and presents today for telephonic preoperative cardiovascular risk assessment.  History of Present Illness    Ryan Allison is a 82 y.o. male who presents via audio/video conferencing for a telehealth visit today.  Pt was last seen in cardiology clinic on 08/09/2022 by Dr. Mariah Milling.  At that time Ryan Allison was stable from a cardiac standpoint.  The patient is now pending procedure as outlined above. Since his last visit, Ryan Allison is doing well from a cardiac standpoint. Patient denies shortness of breath or dyspnea on exertion. No chest pain, pressure, or tightness. Denies lower extremity edema, orthopnea, or PND. Ryan Allison reports occasional, brief palpitations described as heart racing. Ryan Allison was previously going to the gym and walking on the treadmill but his foot pain has  limited that. Ryan Allison is able to perform light to moderate household activities and is independent with all ADLs.   Past Medical History    Past Medical History:  Diagnosis Date   Aortic atherosclerosis (HCC)    Arthritis    Atrial flutter (HCC)    a. diagnosed 05/2018; b. not on OAC 2/2 recurrent life threatening GI bleed; c. CHADS2VASc => 3 (age x 2, vascular disease)   Cancer (HCC) 2011   prostate   GI bleed    History of echocardiogram    a. TTE 05/2018: EF of 60-65%, normal wall motion, mild MR, left atrium normal in size, RVSF normal, PASP 33 mmHg, rhythm was atrial flutter   History of GI diverticular bleed    extended stay in the [facility] ICU due to GI bleeding prior to 2016   HLD (hyperlipidemia)    Hyperlipidemia    Hypertension    Meniscus tear    OSA on CPAP    Peripheral neuropathy    Past Surgical History:  Procedure Laterality Date   COLONOSCOPY Left 06/07/2018   Procedure: COLONOSCOPY;  Surgeon: Pasty Spillers, MD;  Location: [facility] ENDOSCOPY;  Service: Endoscopy;  Laterality: Left;   COLONOSCOPY WITH PROPOFOL N/A 04/06/2015   Procedure: COLONOSCOPY WITH PROPOFOL;  Surgeon: Christena Deem, MD;  Location: [facility] ENDOSCOPY;  Service: Endoscopy;  Laterality: N/A;   COLONOSCOPY WITH PROPOFOL N/A 06/07/2018   Procedure: COLONOSCOPY WITH PROPOFOL;  Surgeon: Pasty Spillers, MD;  Location: [facility] ENDOSCOPY;  Service: Endoscopy;  Laterality: N/A;   HERNIA REPAIR Left 09-23-14   inguinal   INGUINAL HERNIA REPAIR Left  09/23/2014   Procedure: HERNIA REPAIR INGUINAL ADULT;  Surgeon: Kieth Brightly, MD;  Location: [facility] ORS;  Service: General;  Laterality: Left;   MENISCUS REPAIR  2008   VASCULAR SURGERY  2015    Allergies  Allergies  Allergen Reactions   Nsaids Other (See Comments)    Diverticular bleed Diverticular bleed   Tolmetin     Other reaction(s): Other (See Comments) Diverticular bleed Diverticular bleed   Celecoxib Itching and Rash   Diltiazem Hcl Rash    Other Other (See Comments)    Diverticular bleed   Venlafaxine Other (See Comments)    sedation sedation    Home Medications    Prior to Admission medications   Medication Sig Start Date End Date Taking? Authorizing Provider  acetaminophen (TYLENOL) 500 MG tablet Take 500 mg by mouth daily as needed for fever or pain.     [provider]  apixaban (ELIQUIS) 2.5 MG TABS tablet Take 1 tablet (2.5 mg total) by mouth 2 (two) times daily. 04/17/22   Antonieta Iba, MD  cyanocobalamin 1000 MCG tablet Take 1,000 mcg by mouth daily.    [provider]  gabapentin (NEURONTIN) 600 MG tablet Take 600 mg by mouth 5 (five) times daily.     [provider]  metoprolol tartrate (LOPRESSOR) 25 MG tablet Take 0.5 tablets (12.5 mg total) by mouth 2 (two) times daily. 11/27/22   Antonieta Iba, MD  simvastatin (ZOCOR) 20 MG tablet Take 20 mg by mouth at bedtime.     [provider]  traMADol (ULTRAM) 50 MG tablet Take 50 mg by mouth every 6 (six) hours as needed for moderate pain.     [provider]    Physical Exam    Vital Signs:  Ryan Allison does not have vital signs available for review today.  Given telephonic nature of communication, physical exam is limited. AAOx3. NAD. Normal affect.  Speech and respirations are unlabored.  Accessory Clinical Findings    None  Assessment & Plan    Primary Cardiologist: [name], MD  Preoperative cardiovascular risk assessment. Hammer toe repair left third toe by Dr. Lilian Kapur on 01/05/2023.  Chart reviewed as part of pre-operative protocol coverage. According to the RCRI, patient has a 0.4% risk of MACE. Patient reports activity equivalent to 4.0 METS (performs light to moderate household chores).   Given past medical history and time since last visit, based on ACC/AHA guidelines, Ryan Allison would be at acceptable risk for the planned procedure without further cardiovascular testing.    Patient was advised that if Ryan Allison develops new symptoms prior to surgery to contact our office to arrange a follow-up appointment.  Ryan Allison verbalized understanding.  Per Pharm D, may hold Eliquis 2 days prior to procedure.   I will route this recommendation to the requesting party via Epic fax function.  Please call with questions.  Time:   Today, I have spent 7 minutes with the patient with telehealth technology discussing medical history, symptoms, and management plan.     Carlos Levering, NP  12/15/2022, 7:22 AM

## 2022-12-25 ENCOUNTER — Telehealth: Payer: Self-pay | Admitting: Cardiovascular Disease

## 2022-12-25 NOTE — Telephone Encounter (Signed)
   Name: Ryan Allison  DOB: 12/21/1940  MRN: [id number]  Primary Cardiologist: [name], MD  Chart reviewed as part of pre-operative protocol coverage. Because of Ryan Allison's past medical history and time since last visit, he will require a follow-up in-office visit in order to better assess preoperative cardiovascular risk.  Pre-op covering staff: - Please schedule appointment and call patient to inform them. If patient already had an upcoming appointment within acceptable timeframe, please add "pre-op clearance" to the appointment notes so provider is aware. - Please contact requesting surgeon's office via preferred method (i.e, phone, fax) to inform them of need for appointment prior to surgery.  No medications indicated as needing held.   [name], PA-C  12/25/2022, 4:20 PM

## 2022-12-25 NOTE — Telephone Encounter (Signed)
   Pre-operative Risk Assessment    Patient Name: Ryan Allison  DOB: 1940/09/28 MRN: [id number]      Request for Surgical Clearance    Procedure:   BENIGN NEOPLAS OF THE SKIN ON LEFT FOOT SURGERY  Date of Surgery:  Clearance TBD                                Surgeon:  DR [facility] Surgeon's Group or Practice Name:  [facility] Phone number:  [phone] Fax number:  [phone]  Type of Clearance Requested:   - Medical    Type of Anesthesia:  General    Additional requests/questions:    SignedDalia Heading   12/25/2022, 3:51 PM

## 2022-12-26 NOTE — Telephone Encounter (Signed)
Spoke with pt and made him aware of msg below. He states that he has admission testing tomorrow at [facility]. I will resend clearance to requesting surgeons office.

## 2022-12-26 NOTE — Telephone Encounter (Signed)
Spoke with patient and he states that he did a preop telehealth visit already this month. He says that he was informed that he was cleared to have his procedure. Upon reviewing his chart, I do see that he did this on 12/15/22, he would like to know why it is now being determined that he needs to be seen in office prior to proceeding with his procedure. Please advise.  Thanks, 

## 2022-12-27 ENCOUNTER — Other Ambulatory Visit: Payer: Self-pay

## 2022-12-27 ENCOUNTER — Encounter
Admission: RE | Admit: 2022-12-27 | Discharge: 2022-12-27 | Disposition: A | Discharge status: Home / Self Care | Payer: Medicare Other | Source: Ambulatory Visit | Attending: Podiatry | Admitting: Podiatry

## 2022-12-27 HISTORY — DX: Other specified disorders of bone density and structure, unspecified site: M85.80

## 2022-12-27 HISTORY — DX: Personal history of urinary calculi: Z87.442

## 2022-12-27 HISTORY — DX: Unspecified malignant neoplasm of skin of scalp and neck: C44.40

## 2022-12-27 HISTORY — DX: Residual hemorrhoidal skin tags: K64.4

## 2022-12-27 HISTORY — DX: Chronic kidney disease, stage 3 unspecified: N18.30

## 2022-12-27 HISTORY — DX: Diverticulosis of intestine, part unspecified, without perforation or abscess without bleeding: K57.90

## 2022-12-27 HISTORY — DX: Gastro-esophageal reflux disease without esophagitis: K21.9

## 2022-12-27 HISTORY — DX: Obstructive sleep apnea (adult) (pediatric): G47.33

## 2022-12-27 HISTORY — DX: Deficiency of other specified B group vitamins: E53.8

## 2022-12-27 HISTORY — DX: Anxiety disorder, unspecified: F41.9

## 2022-12-27 NOTE — Patient Instructions (Addendum)
Your procedure is scheduled on: Friday, August 23 Report to the Registration Desk on the 1st floor of the [facility] To find out your arrival time, please call [phone] between 1PM - 3PM on: Thursday, August 22 If your arrival time is 6:00 am, do not arrive before that time as the [facility] entrance doors do not open until 6:00 am.  REMEMBER: Instructions that are not followed completely may result in serious medical risk, up to and including death; or upon the discretion of your surgeon and anesthesiologist your surgery may need to be rescheduled.  Do not eat or drink after midnight the night before surgery.  No gum chewing or hard candies.  One week prior to surgery: starting August 16 Stop Anti-inflammatories (NSAIDS) such as Advil, Aleve, Ibuprofen, Motrin, Naproxen, Naprosyn and Aspirin based products such as Excedrin, Goody's Powder, BC Powder. Stop ANY OVER THE COUNTER supplements until after surgery. You may however, continue to take Tylenol and Imodium if needed up until the day of surgery.  Continue taking all prescribed medications with the exception of the following:  Apixaban (Eliquis) hold for 2 days before surgery. The last day to TAKE eliquis is Tuesday, August 20. Resume AFTER surgery per surgeon instruction.  TAKE ONLY THESE MEDICATIONS THE MORNING OF SURGERY WITH A SIP OF WATER:  Metoprolol Gabapentin  No Alcohol for 24 hours before or after surgery.  No Smoking including e-cigarettes for 24 hours before surgery.  No chewable tobacco products for at least 6 hours before surgery.  No nicotine patches on the day of surgery.  On the morning of surgery brush your teeth with toothpaste and water, you may rinse your mouth with mouthwash if you wish. Do not swallow any toothpaste or mouthwash.  Use CHG Soap as directed on instruction sheet.  Do not wear jewelry.  Do not wear lotions, powders, or perfumes.   Do not shave body hair from the neck down 48  hours before surgery.  Contact lenses, hearing aids and dentures may not be worn into surgery.  Do not bring valuables to the hospital. [facility] is not responsible for any missing/lost belongings or valuables.   Notify your doctor if there is any change in your medical condition (cold, fever, infection).  Wear comfortable clothing (specific to your surgery type) to the hospital.  After surgery, you can help prevent lung complications by doing breathing exercises.  Take deep breaths and cough every 1-2 hours. Your doctor may order a device called an Incentive Spirometer to help you take deep breaths.  If you are being discharged the day of surgery, you will not be allowed to drive home. You will need a responsible individual to drive you home and stay with you for 24 hours after surgery.   If you are taking public transportation, you will need to have a responsible individual with you.  Please call the Pre-admissions Testing Dept. at [phone] if you have any questions about these instructions.  Surgery Visitation Policy:  Patients having surgery or a procedure may have two visitors.  Children under the age of 44 must have an adult with them who is not the patient.     Preparing for Surgery with CHLORHEXIDINE GLUCONATE (CHG) Soap  Chlorhexidine Gluconate (CHG) Soap  o An antiseptic cleaner that kills germs and bonds with the skin to continue killing germs even after washing  o Used for showering the night before surgery and morning of surgery  Before surgery, you can play an  important role by reducing the number of germs on your skin.  CHG (Chlorhexidine gluconate) soap is an antiseptic cleanser which kills germs and bonds with the skin to continue killing germs even after washing.  Please do not use if you have an allergy to CHG or antibacterial soaps. If your skin becomes reddened/irritated stop using the CHG.  1. Shower the NIGHT BEFORE SURGERY and the MORNING OF  SURGERY with CHG soap.  2. If you choose to wash your hair, wash your hair first as usual with your normal shampoo.  3. After shampooing, rinse your hair and body thoroughly to remove the shampoo.  4. Use CHG as you would any other liquid soap. You can apply CHG directly to the skin and wash gently with a scrungie or a clean washcloth.  5. Apply the CHG soap to your body only from the neck down. Do not use on open wounds or open sores. Avoid contact with your eyes, ears, mouth, and genitals (private parts). Wash face and genitals (private parts) with your normal soap.  6. Wash thoroughly, paying special attention to the area where your surgery will be performed.  7. Thoroughly rinse your body with warm water.  8. Do not shower/wash with your normal soap after using and rinsing off the CHG soap.  9. Pat yourself dry with a clean towel.  10. Wear clean pajamas to bed the night before surgery.  12. Place clean sheets on your bed the night of your first shower and do not sleep with pets.  13. Shower again with the CHG soap on the day of surgery prior to arriving at the hospital.  14. Do not apply any deodorants/lotions/powders.  15. Please wear clean clothes to the hospital.

## 2023-01-02 ENCOUNTER — Encounter: Payer: Self-pay | Admitting: Podiatry
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
## 2023-01-03 ENCOUNTER — Encounter: Payer: Self-pay | Admitting: Podiatry

## 2023-01-05 ENCOUNTER — Other Ambulatory Visit: Payer: Self-pay

## 2023-01-05 ENCOUNTER — Ambulatory Visit: Payer: Medicare Other | Admitting: Urgent Care

## 2023-01-05 ENCOUNTER — Encounter: Payer: Self-pay | Admitting: Podiatry

## 2023-01-05 ENCOUNTER — Ambulatory Visit: Payer: Medicare Other

## 2023-01-05 ENCOUNTER — Encounter
Admission: RE | Disposition: A | Discharge status: Home / Self Care | Payer: Self-pay | Source: Home / Self Care | Attending: Podiatry

## 2023-01-05 ENCOUNTER — Ambulatory Visit
Admission: RE | Admit: 2023-01-05 | Discharge: 2023-01-05 | Disposition: A | Discharge status: Home / Self Care | Payer: Medicare Other | Attending: Podiatry | Admitting: Podiatry

## 2023-01-05 DIAGNOSIS — E1140 Type 2 diabetes mellitus with diabetic neuropathy, unspecified: Secondary | ICD-10-CM | POA: Insufficient documentation

## 2023-01-05 DIAGNOSIS — I129 Hypertensive chronic kidney disease with stage 1 through stage 4 chronic kidney disease, or unspecified chronic kidney disease: Secondary | ICD-10-CM | POA: Diagnosis not present

## 2023-01-05 DIAGNOSIS — M2042 Other hammer toe(s) (acquired), left foot: Secondary | ICD-10-CM | POA: Diagnosis present

## 2023-01-05 DIAGNOSIS — Z87891 Personal history of nicotine dependence: Secondary | ICD-10-CM | POA: Insufficient documentation

## 2023-01-05 DIAGNOSIS — E1122 Type 2 diabetes mellitus with diabetic chronic kidney disease: Secondary | ICD-10-CM | POA: Diagnosis not present

## 2023-01-05 DIAGNOSIS — N183 Chronic kidney disease, stage 3 unspecified: Secondary | ICD-10-CM | POA: Insufficient documentation

## 2023-01-05 DIAGNOSIS — I480 Paroxysmal atrial fibrillation: Secondary | ICD-10-CM | POA: Diagnosis not present

## 2023-01-05 DIAGNOSIS — E785 Hyperlipidemia, unspecified: Secondary | ICD-10-CM | POA: Diagnosis not present

## 2023-01-05 HISTORY — DX: Long term (current) use of anticoagulants: Z79.01

## 2023-01-05 HISTORY — DX: Unspecified amblyopia, right eye: H53.001

## 2023-01-05 HISTORY — PX: HAMMER TOE SURGERY: SHX385

## 2023-01-05 HISTORY — DX: Scoliosis, unspecified: M41.9

## 2023-01-05 HISTORY — DX: Unspecified intestinal obstruction, unspecified as to partial versus complete obstruction: K56.609

## 2023-01-05 HISTORY — DX: Other dysphagia: R13.19

## 2023-01-05 HISTORY — DX: Other intervertebral disc degeneration, lumbar region: M51.36

## 2023-01-05 HISTORY — DX: Unspecified right bundle-branch block: I45.10

## 2023-01-05 HISTORY — DX: Post-traumatic stress disorder, unspecified: F43.10

## 2023-01-05 HISTORY — DX: Other intervertebral disc degeneration, lumbar region without mention of lumbar back pain or lower extremity pain: M51.369

## 2023-01-05 HISTORY — DX: Other spondylosis with myelopathy, cervical region: M47.12

## 2023-01-05 SURGERY — CORRECTION, HAMMER TOE
Anesthesia: Monitor Anesthesia Care | Site: Third Toe | Laterality: Left

## 2023-01-05 MED ORDER — CEFAZOLIN SODIUM-DEXTROSE 2-4 GM/100ML-% IV SOLN
2.0000 g | INTRAVENOUS | Status: AC
  Administered 2023-01-05: 2 g via INTRAVENOUS

## 2023-01-05 MED ORDER — ORAL CARE MOUTH RINSE: 15.0000 mL | Freq: Once | OROMUCOSAL | Status: AC

## 2023-01-05 MED ORDER — FAMOTIDINE 20 MG PO TABS
ORAL_TABLET | ORAL | Status: AC
  Filled 2023-01-05: qty 1

## 2023-01-05 MED ORDER — CHLORHEXIDINE GLUCONATE 0.12 % MT SOLN
OROMUCOSAL | Status: AC
  Filled 2023-01-05: qty 15

## 2023-01-05 MED ORDER — FAMOTIDINE 20 MG PO TABS
20.0000 mg | ORAL_TABLET | Freq: Once | ORAL | Status: AC
  Administered 2023-01-05: 20 mg via ORAL

## 2023-01-05 MED ORDER — BUPIVACAINE HCL (PF) 0.5 % IJ SOLN
INTRAMUSCULAR | Status: DC | PRN
Start: 2023-01-05 — End: 2023-01-05
  Administered 2023-01-05: 20 mL

## 2023-01-05 MED ORDER — DROPERIDOL 2.5 MG/ML IJ SOLN: 0.6250 mg | Freq: Once | INTRAMUSCULAR | Status: DC | PRN

## 2023-01-05 MED ORDER — BUPIVACAINE HCL (PF) 0.5 % IJ SOLN
INTRAMUSCULAR | Status: AC
  Filled 2023-01-05: qty 30

## 2023-01-05 MED ORDER — OXYCODONE HCL 5 MG PO TABS
5.0000 mg | ORAL_TABLET | Freq: Three times a day (TID) | ORAL | 0 refills | Status: AC | PRN
Start: 2023-01-05 — End: 2023-01-10

## 2023-01-05 MED ORDER — ACETAMINOPHEN 10 MG/ML IV SOLN: 1000.0000 mg | Freq: Once | INTRAVENOUS | Status: DC | PRN

## 2023-01-05 MED ORDER — BUPIVACAINE-EPINEPHRINE (PF) 0.25% -1:200000 IJ SOLN
INTRAMUSCULAR | Status: AC
  Filled 2023-01-05: qty 30

## 2023-01-05 MED ORDER — CEFAZOLIN SODIUM-DEXTROSE 2-4 GM/100ML-% IV SOLN
INTRAVENOUS | Status: AC
  Filled 2023-01-05: qty 100

## 2023-01-05 MED ORDER — OXYCODONE HCL 5 MG PO TABS: 5.0000 mg | ORAL_TABLET | Freq: Once | ORAL | Status: DC | PRN

## 2023-01-05 MED ORDER — LIDOCAINE-EPINEPHRINE 1 %-1:100000 IJ SOLN
INTRAMUSCULAR | Status: AC
  Filled 2023-01-05: qty 1

## 2023-01-05 MED ORDER — PROMETHAZINE HCL 25 MG/ML IJ SOLN: 6.2500 mg | INTRAMUSCULAR | Status: DC | PRN

## 2023-01-05 MED ORDER — LACTATED RINGERS IV SOLN: INTRAVENOUS | Status: DC

## 2023-01-05 MED ORDER — PROPOFOL 1000 MG/100ML IV EMUL
INTRAVENOUS | Status: AC
  Filled 2023-01-05: qty 100

## 2023-01-05 MED ORDER — PROPOFOL 10 MG/ML IV BOLUS
INTRAVENOUS | Status: DC | PRN
Start: 2023-01-05 — End: 2023-01-05
  Administered 2023-01-05: 50 mg via INTRAVENOUS
  Administered 2023-01-05: 30 mg via INTRAVENOUS
  Administered 2023-01-05: 20 mg via INTRAVENOUS

## 2023-01-05 MED ORDER — 0.9 % SODIUM CHLORIDE (POUR BTL) OPTIME
TOPICAL | Status: DC | PRN
  Administered 2023-01-05: 500 mL

## 2023-01-05 MED ORDER — ACETAMINOPHEN 10 MG/ML IV SOLN
INTRAVENOUS | Status: DC | PRN
  Administered 2023-01-05: 1000 mg via INTRAVENOUS

## 2023-01-05 MED ORDER — LIDOCAINE HCL (CARDIAC) PF 100 MG/5ML IV SOSY
PREFILLED_SYRINGE | INTRAVENOUS | Status: DC | PRN
  Administered 2023-01-05: 100 mg via INTRAVENOUS

## 2023-01-05 MED ORDER — FENTANYL CITRATE (PF) 100 MCG/2ML IJ SOLN: 25.0000 ug | INTRAMUSCULAR | Status: DC | PRN

## 2023-01-05 MED ORDER — ACETAMINOPHEN 500 MG PO TABS: 1000.0000 mg | ORAL_TABLET | Freq: Four times a day (QID) | ORAL | 0 refills | Status: AC | PRN

## 2023-01-05 MED ORDER — FENTANYL CITRATE (PF) 100 MCG/2ML IJ SOLN
INTRAMUSCULAR | Status: DC | PRN
  Administered 2023-01-05 (×2): 25 ug via INTRAVENOUS
  Administered 2023-01-05: 50 ug via INTRAVENOUS

## 2023-01-05 MED ORDER — BUPIVACAINE HCL (PF) 0.25 % IJ SOLN
INTRAMUSCULAR | Status: AC
  Filled 2023-01-05: qty 30

## 2023-01-05 MED ORDER — LIDOCAINE HCL (PF) 1 % IJ SOLN
INTRAMUSCULAR | Status: AC
  Filled 2023-01-05: qty 30

## 2023-01-05 MED ORDER — CHLORHEXIDINE GLUCONATE 0.12 % MT SOLN
15.0000 mL | Freq: Once | OROMUCOSAL | Status: AC
  Administered 2023-01-05: 15 mL via OROMUCOSAL

## 2023-01-05 MED ORDER — EPINEPHRINE PF 1 MG/ML IJ SOLN
INTRAMUSCULAR | Status: AC
  Filled 2023-01-05: qty 1

## 2023-01-05 MED ORDER — OXYCODONE HCL 5 MG/5ML PO SOLN: 5.0000 mg | Freq: Once | ORAL | Status: DC | PRN

## 2023-01-05 MED ORDER — DEXMEDETOMIDINE HCL IN NACL 200 MCG/50ML IV SOLN
INTRAVENOUS | Status: DC | PRN
  Administered 2023-01-05 (×2): 4 ug via INTRAVENOUS

## 2023-01-05 MED ORDER — FENTANYL CITRATE (PF) 100 MCG/2ML IJ SOLN
INTRAMUSCULAR | Status: AC
  Filled 2023-01-05: qty 2

## 2023-01-05 SURGICAL SUPPLY — 61 items
APL SKNCLS STERI-STRIP NONHPOA (GAUZE/BANDAGES/DRESSINGS) ×1
BENZOIN TINCTURE PRP APPL 2/3 (GAUZE/BANDAGES/DRESSINGS) ×1 IMPLANT
BLADE MED AGGRESSIVE (BLADE) IMPLANT
BLADE OSC/SAGITTAL MD 5.5X18 (BLADE) IMPLANT
BLADE SURG 15 STRL LF DISP TIS (BLADE) IMPLANT
BLADE SURG 15 STRL SS (BLADE)
BNDG CMPR 5X4 CHSV STRCH STRL (GAUZE/BANDAGES/DRESSINGS) ×1
BNDG CMPR 75X41 PLY HI ABS (GAUZE/BANDAGES/DRESSINGS) ×1
BNDG CMPR STD VLCR NS LF 5.8X4 (GAUZE/BANDAGES/DRESSINGS) ×1
BNDG COHESIVE 4X5 TAN STRL LF (GAUZE/BANDAGES/DRESSINGS) ×1 IMPLANT
BNDG ELASTIC 4X5.8 VLCR NS LF (GAUZE/BANDAGES/DRESSINGS) ×1 IMPLANT
BNDG ESMARCH 4 X 12 STRL LF (GAUZE/BANDAGES/DRESSINGS) ×1
BNDG ESMARCH 4X12 STRL LF (GAUZE/BANDAGES/DRESSINGS) ×1 IMPLANT
BNDG GAUZE DERMACEA FLUFF 4 (GAUZE/BANDAGES/DRESSINGS) ×1 IMPLANT
BNDG GZE DERMACEA 4 6PLY (GAUZE/BANDAGES/DRESSINGS) ×1
BNDG STRETCH 4X75 STRL LF (GAUZE/BANDAGES/DRESSINGS) ×1 IMPLANT
COVER LIGHT HANDLE STERIS (MISCELLANEOUS) ×2 IMPLANT
CUFF TOURN SGL QUICK 12 (TOURNIQUET CUFF) IMPLANT
CUFF TOURN SGL QUICK 18X4 (TOURNIQUET CUFF) ×1 IMPLANT
DRAPE FLUOR MINI C-ARM 54X84 (DRAPES) ×1 IMPLANT
DURAPREP 26ML APPLICATOR (WOUND CARE) ×1 IMPLANT
ELECT REM PT RETURN 9FT ADLT (ELECTROSURGICAL) ×1
ELECTRODE REM PT RTRN 9FT ADLT (ELECTROSURGICAL) ×1 IMPLANT
GAUZE SPONGE 4X4 12PLY STRL (GAUZE/BANDAGES/DRESSINGS) ×1 IMPLANT
GAUZE XEROFORM 1X8 LF (GAUZE/BANDAGES/DRESSINGS) ×1 IMPLANT
GLOVE BIO SURGEON STRL SZ7.5 (GLOVE) ×1 IMPLANT
GLOVE INDICATOR 8.0 STRL GRN (GLOVE) ×1 IMPLANT
GOWN STRL REUS W/ TWL XL LVL3 (GOWN DISPOSABLE) ×2 IMPLANT
GOWN STRL REUS W/TWL XL LVL3 (GOWN DISPOSABLE) ×2
K-WIRE DBL END TROCAR 6X.045 (WIRE)
K-WIRE DBL END TROCAR 6X.062 (WIRE)
KIT TURNOVER KIT A (KITS) ×1 IMPLANT
KWIRE DBL END TROCAR 6X.045 (WIRE) IMPLANT
KWIRE DBL END TROCAR 6X.062 (WIRE) IMPLANT
MANIFOLD NEPTUNE II (INSTRUMENTS) ×1 IMPLANT
MATRIX LENEVA ADIPOSE 3ML (Tissue) IMPLANT
NDL HYPO 22X1.5 SAFETY MO (MISCELLANEOUS) ×1 IMPLANT
NEEDLE HYPO 22X1.5 SAFETY MO (MISCELLANEOUS) ×1
NS IRRIG 500ML POUR BTL (IV SOLUTION) ×1 IMPLANT
PACK EXTREMITY ARMC (MISCELLANEOUS) ×1 IMPLANT
PIN BALLS 3/8 F/.045 WIRE (MISCELLANEOUS) ×1 IMPLANT
RASP SM TEAR CROSS CUT (RASP) IMPLANT
STOCKINETTE IMPERV 14X48 (MISCELLANEOUS) ×1 IMPLANT
STRAP SAFETY 5IN WIDE (MISCELLANEOUS) ×1 IMPLANT
STRIP CLOSURE SKIN 1/4X4 (GAUZE/BANDAGES/DRESSINGS) ×1 IMPLANT
SUT ETHILON 4-0 (SUTURE) ×1
SUT ETHILON 4-0 FS2 18XMFL BLK (SUTURE) ×1
SUT ETHILON 5-0 FS-2 18 BLK (SUTURE) IMPLANT
SUT MNCRL 4-0 (SUTURE) ×1
SUT MNCRL 4-0 27XMFL (SUTURE) ×1
SUT MNCRL 5-0+ PC-1 (SUTURE) IMPLANT
SUT VIC AB 2-0 SH 27 (SUTURE)
SUT VIC AB 2-0 SH 27XBRD (SUTURE) IMPLANT
SUT VIC AB 3-0 SH 27 (SUTURE)
SUT VIC AB 3-0 SH 27X BRD (SUTURE) IMPLANT
SUT VIC AB 4-0 FS2 27 (SUTURE) IMPLANT
SUTURE ETHLN 4-0 FS2 18XMF BLK (SUTURE) IMPLANT
SUTURE MNCRL 4-0 27XMF (SUTURE) IMPLANT
SYR 50ML LL SCALE MARK (SYRINGE) ×1 IMPLANT
TRAP FLUID SMOKE EVACUATOR (MISCELLANEOUS) ×1 IMPLANT
WATER STERILE IRR 500ML POUR (IV SOLUTION) ×1 IMPLANT

## 2023-01-05 NOTE — Discharge Instructions (Addendum)
Post-Surgery Instructions  1. If you are recuperating from surgery anywhere other than home, please be sure to leave [name] a number where you can be reached. 2. Go directly home and rest. 3. The keep operated foot (or feet) elevated six inches above the hip when sitting or lying down. 4. Support the elevated foot and leg with pillows under the calf. DO NOT PLACE PILLOWS UNDER THE KNEE. 5. DO NOT REMOVE or get your bandages wet. This will increase your chances of getting an infection. 6. Wear your surgical shoe at all times when you are up. 7. A limited amount of pain and swelling may occur. The skin may take on a bruised appearance. This is no cause for alarm. 8. For slight pain and swelling, apply an ice pack directly over the bandage for 15 minutes every hour. Continue icing until seen in the office. DO NOT apply any form of heat to the area. 9. Have prescription(s) filled immediately and take as directed. 10. Drink lots of liquids, water, and juice. 11. CALL THE OFFICE IMMEDIATELY IF: a. Bleeding continues b. Pain increases and/or does not respond to medication c. Bandage or cast appears too tight d. Any liquids (water, coffee, etc.) have spilled on your bandages. e. Tripping, falling, or stubbing the surgical foot f. If your temperature rises above 101 g. If you have ANY questions at all 12. Please use the crutches, knee scooter, or walker you have prescribed, rented, or purchased. If you are non-weight bearing DO NOT put weight on the operated foot for _________ days. If you are weight-bearing, follow your physician's instructions. You are expected to be: ? weight-bearing   13. Special Instructions: Your surgery went well. I removed two parts of the knuckle on the third toe and removed the callus/corn under it. I also injected the fat graft matrix that should encourage fat to grow into the ball of the foot to re establish the cushion underneath the bones  14. Your next appointment  is: 01/10/2023 1:15 PM  in the [place] office   If you need to reach the nurse for any reason, please call: [place]: [phone] [place]: [phone] [place]: [phone]  AMBULATORY SURGERY  DISCHARGE INSTRUCTIONS   The drugs that you were given will stay in your system until tomorrow so for the next 24 hours you should not:  Drive an automobile Make any legal decisions Drink any alcoholic beverage   You may resume regular meals tomorrow.  Today it is better to start with liquids and gradually work up to solid foods.  You may eat anything you prefer, but it is better to start with liquids, then soup and crackers, and gradually work up to solid foods.   Please notify your doctor immediately if you have any unusual bleeding, trouble breathing, redness and pain at the surgery site, drainage, fever, or pain not relieved by medication.    Additional Instructions:   Please contact your physician with any problems or Same Day Surgery at [phone], Monday through Friday 6 am to 4 pm, or [place] at [facility] number at [phone].

## 2023-01-05 NOTE — Brief Op Note (Signed)
01/05/2023  8:33 AM  PATIENT:  Ryan Allison  82 y.o. male  PRE-OPERATIVE DIAGNOSIS:  HAMMERTOE  POST-OPERATIVE DIAGNOSIS:  HAMMERTOE  PROCEDURE:  Procedure(s): HAMMER TOE CORRECTION; INJECTION OF FAT PAD GRAFT (Left)  SURGEON:  Surgeons and Role:    * Christeen Lai, Rachelle Hora, DPM - Primary  PHYSICIAN ASSISTANT:   ASSISTANTS: none   ANESTHESIA:   local and MAC  EBL:  minimal   BLOOD ADMINISTERED:none  DRAINS: none   LOCAL MEDICATIONS USED:  MARCAINE    and Amount: 20 ml  SPECIMEN:  No Specimen  DISPOSITION OF SPECIMEN:  N/A  COUNTS:  YES  TOURNIQUET:   Total Tourniquet Time Documented: Calf (Left) - 18 minutes Total: Calf (Left) - 18 minutes   DICTATION: .Note written in EPIC  PLAN OF CARE: Discharge to home after PACU  PATIENT DISPOSITION:  PACU - hemodynamically stable.   Delay start of Pharmacological VTE agent (>24hrs) due to surgical blood loss or risk of bleeding: restart eliquis AM dose 8/24

## 2023-01-05 NOTE — H&P (Signed)
History and Physical Interval Note:  01/05/2023 7:37 AM  Ryan Allison  has presented today for surgery, with the diagnosis of hammertoe, metatarsalgia.  The various methods of treatment have been discussed with the patient and family. After consideration of risks, benefits and other options for treatment, the patient has consented to   Procedure(s): HAMMER TOE CORRECTION (Left) as a surgical intervention.  The patient's history has been reviewed, patient examined, no change in status, stable for surgery.  I have reviewed the patient's chart and labs.  Questions were answered to the patient's satisfaction.     [name]

## 2023-01-05 NOTE — Anesthesia Postprocedure Evaluation (Signed)
Anesthesia Post Note  Patient: Ryan Allison  Procedure(s) Performed: HAMMER TOE CORRECTION; INJECTION OF FAT PAD GRAFT (Left: Third Toe)  Patient location during evaluation: PACU Anesthesia Type: MAC Level of consciousness: awake and alert Pain management: pain level controlled Vital Signs Assessment: post-procedure vital signs reviewed and stable Respiratory status: spontaneous breathing, nonlabored ventilation, respiratory function stable and patient connected to nasal cannula oxygen Cardiovascular status: blood pressure returned to baseline and stable Postop Assessment: no apparent nausea or vomiting Anesthetic complications: no   There were no known notable events for this encounter.   Last Vitals:  Vitals:   01/05/23 0845 01/05/23 0906  BP: 134/76 (!) 156/78  Pulse: 69 69  Resp: 17 16  Temp: (!) 36.4 C (!) 36.1 C  SpO2: 100% 99%    Last Pain:  Vitals:   01/05/23 0906  TempSrc: Temporal  PainSc: 0-No pain                 Yevette Edwards

## 2023-01-05 NOTE — Transfer of Care (Signed)
Immediate Anesthesia Transfer of Care Note  Patient: Ryan Allison  Procedure(s) Performed: HAMMER TOE CORRECTION; INJECTION OF FAT PAD GRAFT (Left: Third Toe)  Patient Location: PACU  Anesthesia Type:MAC  Level of Consciousness: awake, alert , and oriented  Airway & Oxygen Therapy: Patient Spontanous Breathing  Post-op Assessment: Report given to RN and Post -op Vital signs reviewed and stable  Post vital signs: stable  Last Vitals:  Vitals Value Taken Time  BP 123/68 01/05/23 0826  Temp    Pulse 64 01/05/23 0828  Resp 15 01/05/23 0828  SpO2 100 % 01/05/23 0828  Vitals shown include unfiled device data.  Last Pain:  Vitals:   01/05/23 0619  TempSrc: Temporal  PainSc: 3          Complications: No notable events documented.

## 2023-01-05 NOTE — Anesthesia Preprocedure Evaluation (Signed)
Anesthesia Evaluation  Patient identified by MRN, date of birth, ID band Patient awake    Reviewed: Allergy & Precautions, H&P , NPO status , Patient's Chart, lab work & pertinent test results, reviewed documented beta blocker date and time   Airway Mallampati: II  TM Distance: >3 FB Neck ROM: full    Dental no notable dental hx. (+) Teeth Intact   Pulmonary sleep apnea , former smoker   Pulmonary exam normal breath sounds clear to auscultation       Cardiovascular Exercise Tolerance: Poor hypertension, On Medications + dysrhythmias Atrial Fibrillation  Rhythm:regular Rate:Normal     Neuro/Psych  PSYCHIATRIC DISORDERS Anxiety      Neuromuscular disease    GI/Hepatic Neg liver ROS, PUD,GERD  Medicated,,  Endo/Other  negative endocrine ROSdiabetes    Renal/GU Renal disease     Musculoskeletal   Abdominal   Peds  Hematology  (+) Blood dyscrasia, anemia   Anesthesia Other Findings   Reproductive/Obstetrics negative OB ROS                             Anesthesia Physical Anesthesia Plan  ASA: 4  Anesthesia Plan: MAC   Post-op Pain Management:    Induction:   PONV Risk Score and Plan:   Airway Management Planned:   Additional Equipment:   Intra-op Plan:   Post-operative Plan:   Informed Consent: I have reviewed the patients History and Physical, chart, labs and discussed the procedure including the risks, benefits and alternatives for the proposed anesthesia with the patient or authorized representative who has indicated his/her understanding and acceptance.       Plan Discussed with: CRNA  Anesthesia Plan Comments:        Anesthesia Quick Evaluation

## 2023-01-06 NOTE — Op Note (Signed)
Patient Name: Ryan Allison DOB: [birth year]  MRN: [id number]   Date of Service: 01/05/2023  Surgeon: Dr. Sharl Ma, DPM Assistants: None Pre-operative Diagnosis:  HAMMERTOE Post-operative Diagnosis:  HAMMERTOE Procedures:  INJECTION OF FAT PAD GRAFT CORRECTION HAMMERTOE  Pathology/Specimens: * No specimens in log * Anesthesia: MAC with local Hemostasis:  Total Tourniquet Time Documented: Calf (Left) - 18 minutes Total: Calf (Left) - 18 minutes  Estimated Blood Loss: 5cc Materials:  Implant Name Type Inv. Item Serial No. Manufacturer Lot No. LRB No. Used Action  [employer] - [id number] Tissue [employer] [id number] MUSCULOSKELETL TRANSPLANT FNDN  Left 1 Implanted   Medications: 20mL Marcaine 0.5% plain Complications: no complications noted  Indications for Procedure:  This is a 82 y.o. male with a history of painful hammertoe on the left third toe with a painful corn plantar to the proximal phalangeal head.  After failing non surgical intervention, he elected for operative treatment. All risks, benefits and potential complications discussed prior to the procedure. All questions addressed. Informed consent signed and reviewed.      Procedure in Detail: Patient was identified in pre-operative holding area. Formal consent was signed and the left lower extremity was marked. Patient was brought back to the operating room. Anesthesia was induced. The extremity was prepped and draped in the usual sterile fashion. Timeout was taken to confirm patient name, laterality, and procedure prior to incision.   Attention was then directed to the left third digits where a linear incision was made from the base of the proximal phalanx to the distal middle phalanx.  This was carried deep to expose the extensor digitorum longus tendon and a transverse tenotomy was made.  The PIPJ was exposed and the collateral ligaments were released.  The articular  surface of the head of the proximal phalanx was then resected using a sagittal saw corresponding to the skin lesion present on the fluoroscopy.  This reduced the bony prominence, however the base of the middle phalanx also was prominent.  This was also resected.  The resection site was irrigated thoroughly.  Soft tissue's of the collateral ligaments and extensor tendon were interposed into the arthroplasty and repaired with 4-0 Monocryl.  Skin was closed with 4-0 nylon.  A #15 blade was then used to excise the lesion plantarly.  Following this 3 cc of acellular adipose matrix was injected in the plantar fat pad under the second third and fourth and fifth metatarsal heads in the areas of thinning and pain.  The foot was then dressed with Xeroform dry sterile dressings and Ace wrap. Patient tolerated the procedure well.   Disposition: Following a period of post-operative monitoring, patient will be transferred to home.

## 2023-01-10 ENCOUNTER — Ambulatory Visit (INDEPENDENT_AMBULATORY_CARE_PROVIDER_SITE_OTHER): Payer: Medicare Other

## 2023-01-10 ENCOUNTER — Encounter: Payer: Self-pay | Admitting: Podiatry

## 2023-01-10 ENCOUNTER — Ambulatory Visit (INDEPENDENT_AMBULATORY_CARE_PROVIDER_SITE_OTHER): Payer: Medicare Other | Admitting: Podiatry

## 2023-01-10 DIAGNOSIS — M2042 Other hammer toe(s) (acquired), left foot: Secondary | ICD-10-CM

## 2023-01-10 DIAGNOSIS — Z9889 Other specified postprocedural states: Secondary | ICD-10-CM

## 2023-01-10 NOTE — Progress Notes (Signed)
  Subjective:  Patient ID: Ryan Allison, male    DOB: [birth year],  MRN: [id number]  Chief Complaint  Patient presents with   Routine Post Op    POV # 1 DOS 01/05/23 3RD TOE CORRECTION, INJECTION OF FAT PAD GRAFT LEFT FOOT "It's doing good, I suppose."     82 y.o. male returns for post-op check.  Not having much pain he did not have to take the oxycodone  Review of Systems: Negative except as noted in the HPI. Denies N/V/F/Ch.   Objective:  There were no vitals filed for this visit. There is no height or weight on file to calculate BMI. Constitutional Well developed. Well nourished.  Vascular Foot warm and well perfused. Capillary refill normal to all digits.  Calf is soft and supple, no posterior calf or knee pain, negative Homans' sign  Neurologic Normal speech. Oriented to person, place, and time. Epicritic sensation to light touch grossly present bilaterally.  Dermatologic Skin healing well without signs of infection. Skin edges well coapted without signs of infection.  Orthopedic: Tenderness to palpation noted about the surgical site.   Multiple view plain film radiographs: Interval resection of middle and proximal phalanx left third toe Assessment:   1. Hammer toe of left foot    Plan:  Patient was evaluated and treated and all questions answered.  S/p foot surgery left -Progressing as expected post-operatively. -XR: Interval resection of left third toe phalanx -WB Status: WBAT in postop shoe -Sutures: Return in 2 weeks to remove. -Medications: No refills required -Foot redressed.  No follow-ups on file.

## 2023-01-24 ENCOUNTER — Ambulatory Visit (INDEPENDENT_AMBULATORY_CARE_PROVIDER_SITE_OTHER): Payer: Medicare Other | Admitting: Podiatry

## 2023-01-24 ENCOUNTER — Encounter: Payer: Self-pay | Admitting: Podiatry

## 2023-01-24 DIAGNOSIS — D2372 Other benign neoplasm of skin of left lower limb, including hip: Secondary | ICD-10-CM

## 2023-01-24 DIAGNOSIS — M216X2 Other acquired deformities of left foot: Secondary | ICD-10-CM

## 2023-01-24 DIAGNOSIS — M2042 Other hammer toe(s) (acquired), left foot: Secondary | ICD-10-CM

## 2023-01-28 NOTE — Progress Notes (Signed)
Subjective:  Patient ID: Ryan Allison, male    DOB: 10-Jan-1941,  MRN: [id number]  Chief Complaint  Patient presents with   Routine Post Op    POV # 2 DOS 01/05/23 --- 3RD TOE CORRECTION, INJECTION OF FAT PAD GRAFT LEFT FOOT "It's doing well."     82 y.o. male returns for post-op check.    Review of Systems: Negative except as noted in the HPI. Denies N/V/F/Ch.   Objective:  There were no vitals filed for this visit. There is no height or weight on file to calculate BMI. Constitutional Well developed. Well nourished.  Vascular Foot warm and well perfused. Capillary refill normal to all digits.  Calf is soft and supple, no posterior calf or knee pain, negative Homans' sign  Neurologic Normal speech. Oriented to person, place, and time. Epicritic sensation to light touch grossly present bilaterally.  Dermatologic Incision well-healed not hypertrophic  Orthopedic: Tenderness to palpation noted about the surgical site.   Multiple view plain film radiographs: Interval resection of middle and proximal phalanx left third toe Assessment:   1. Hammer toe of left foot   2. Benign neoplasm of skin of left foot   3. Plantar fat pad atrophy of left foot    Plan:  Patient was evaluated and treated and all questions answered.  S/p foot surgery left -Doing well sutures removed May gradually return to regular shoe gear.  Return in 3 weeks to reevaluate.  No follow-ups on file.

## 2023-02-04 NOTE — Progress Notes (Unsigned)
Date:  02/05/2023   ID:  Finch, Pfuhl [birth year], MRN [id number]  Patient Location:  [phone] [facility] [place] [address]   Provider location:   [facility] Dad, [place] office  PCP:  Lynnea Ferrier, MD  Cardiologist:  Hubbard Robinson [facility]   Chief Complaint  Patient presents with   6 month follow up     "Doing well." Medications reviewed by the patient verbally.     History of Present Illness:    MOO ZASTOUPIL is a 82 y.o. male  past medical history of atrial flutter of uncertain chronicity in 05/2018 not on full dose anticoagulation secondary to prior life threatening GI bleeds,  recurrent prostate cancer , PET scan from 04/2018 showing no metastatic disease,  aortic atherosclerosis,  HLD,  OSA on CPAP,  diverticulosis,  peripheral neuropathy,  prior tobacco abuse,  GERD In the hospital  from 06/06/2018 to 06/08/18 for GI bleed and  atrial flutter Persistent atrial fibrillation January 2020, hematuria  Who presents for atrial flutter, shortness of breath/diastolic CHF, weakness  Seen by myself in clinic 3/24  In follow-up he reports he is taking metoprolol tartrate 25 mg in the morning No evening dose He does appreciate some palpitations, tachycardia when he goes to the gym  Recent cardiac studies were reviewed Echocardiogram May 2024 EF 60 to 65% Mild MR EF improved compared to prior study May 2021  Discussion concerning his Eliquis 2.5 twice daily, subtherapeutic History of hematuria, GI bleed, none recently No TIA symptoms, no CVA Prefers no aggressive maneuvers such as Watchman device Previously declined cardioversion Denies leg swelling, no chest pain or shortness of breath on exertion  EKG personally reviewed by myself on todays visit EKG Interpretation Date/Time:  Monday February 05 2023 08:33:38 EDT Ventricular Rate:  65 PR Interval:    QRS Duration:  116 QT Interval:  440 QTC Calculation: 457 R Axis:   -6  Text  Interpretation: Atrial flutter with 4:1 A-V conduction Incomplete right bundle branch block Nonspecific ST abnormality When compared with ECG of 25-Jun-2021 10:43, Vent. 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   in atrial flutter with RVR.   transfusion of pRBC secondary to blood loss. He was rate controlled.   not able to be placed on anticoagulation given recurrent GI bleeding.   colonoscopy on 06/07/2018 that showed possible evidence of diverticular bleed in the sigmoid colon, diverticulosis of the transverse, ascending, and sigmoid colon as well as radiation proctitis. It was recommended he follow up with a  colorectal surgeon for possible colectomy due to recurrent diverticular bleed.   Echo during his admission on 06/07/2018 showed an EF of 60-65%, normal wall motion, mild MR, left atrium normal in size, RVSF normal, PASP 33 mmHg, rhythm was atrial flutter.   Seen by PCP on 06/11/2018, EKG showed he  remained in atrial flutter  PCP again on 06/17/2018 with a rash that started underneath his axilla, though spread diffusely. He had already self discontinued Cardizem with some improvement in rash. He was placed on metoprolol for rate control of his atrial flutter.  Continues on metoprolol  Prior to GI surgery, had no sx of SOB from his atrial flutter Underwent colectomy at [facility], for diverticuli and GI bleeding, 07/12/18 In follow up had SBO, at [facility] x 1 week Ostomy output has been good.  Drinking Gatorade to stay hydrated  Echo 05/2018 - Left ventricle: The cavity size was normal. Systolic function was   normal. The estimated ejection fraction was in the range of 60%   to 65%. Wall motion was normal; there were no regional wall   motion abnormalities. - Mitral valve: There was mild regurgitation. - Left atrium: The atrium was normal in size. - Right ventricle: Systolic function was normal. - Pulmonary arteries: Systolic pressure was within the normal   range. PA peak pressure: 33 mm Hg (S).   Impressions: - Rhythm is atrial flutter.    Past Medical History:  Diagnosis Date   Amblyopia of right eye    Anxiety    Aortic atherosclerosis (HCC)    Arthritis    Atrial fibrillation and flutter (HCC) 05/2018   a.) CHA2DS2VASc = 4 (age x 2, HTN, vascular disease history); b.) rate/rhythm maintained on oral metoprolol tartrate; chronically anticoagulated with dose reduced apixaban (experienced hematuria on rivaroxaban)   B12 deficiency    Cervical spondylosis with myelopathy    Chronic kidney disease, stage 3 (HCC)    DDD (degenerative disc disease), lumbar    Diverticulosis    Esophageal dysphagia    External hemorrhoids    GERD (gastroesophageal reflux disease)    GI bleed    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   History of echocardiogram    a.) TTE 06/07/2018: EF 60-65%, no RWMAs, mild MR, PASP 33 mmHg; b.) TTE 09/19/2019: EF 50-55%, mild LVH, mod RVE, mild LA dil, mild-mod TR, mild MR,  PASP 42.5; c.) TTE 09/14/2022: EF 60-65%, mod basal-septal LVH, mild MR/AR, mod TR, AoV sclerosis, Ao root 37 mm   History of GI diverticular bleed    a.) required prolonged ICU admission at [facility]   History of kidney stones    HLD (hyperlipidemia)    Hyperlipidemia    Hypertension    Kyphoscoliosis    Meniscus tear    On apixaban therapy    OSA (obstructive sleep apnea)    a.) does not require nocturnal PAP therapy   Osteopenia    Peripheral neuropathy    Prostate cancer (HCC) 2011   PTSD (post-traumatic stress disorder)    RBBB (right bundle branch block)    SBO (small bowel obstruction) (HCC)    Skin cancer of scalp    Past Surgical History:  Procedure Laterality Date   COLECTOMY  07/12/2018   with end ileostomy   COLONOSCOPY Left 06/07/2018   Procedure: COLONOSCOPY;  Surgeon: Pasty Spillers, MD;  Location: [facility] ENDOSCOPY;  Service: Endoscopy;  Laterality: Left;   COLONOSCOPY WITH ESOPHAGOGASTRODUODENOSCOPY (EGD)  09/12/2010   COLONOSCOPY WITH PROPOFOL N/A 04/06/2015  Procedure: COLONOSCOPY WITH PROPOFOL;  Surgeon: Christena Deem, MD;  Location: [facility] ENDOSCOPY;  Service: Endoscopy;  Laterality: N/A;   COLONOSCOPY WITH PROPOFOL N/A 06/07/2018   Procedure: COLONOSCOPY WITH PROPOFOL;  Surgeon: Pasty Spillers, MD;  Location: [facility] ENDOSCOPY;  Service: Endoscopy;  Laterality: N/A;   HAMMER TOE SURGERY Left 01/05/2023   Procedure: HAMMER TOE CORRECTION; INJECTION OF FAT PAD GRAFT;  Surgeon: [name], DPM;  Location: [facility] ORS;  Service: Podiatry;  Laterality: Left;   INGUINAL HERNIA REPAIR Left 09/23/2014   Procedure: HERNIA REPAIR INGUINAL ADULT;  Surgeon: Kieth Brightly, MD;  Location: [facility] ORS;  Service: General;  Laterality: Left;   LASIK  2000   MENISCUS REPAIR Left 05/15/2006   MOHS SURGERY     right scalp   SKIN GRAFT FULL THICKNESS ARM Right 1966   [place] mine   STRABISMUS SURGERY Right 07/17/2016   VASCULAR SURGERY  05/15/2013   s/p GI  Bleed     Current Meds  Medication Sig   acetaminophen (TYLENOL) 500 MG tablet Take 500 mg by mouth daily as needed for fever or pain.    cyanocobalamin 1000 MCG tablet Take 1,000 mcg by mouth daily.   gabapentin (NEURONTIN) 600 MG tablet Take 600 mg by mouth 4 (four) times daily.   loperamide (IMODIUM) 2 MG capsule Take 2 mg by mouth daily as needed for diarrhea or loose stools.   metoprolol tartrate (LOPRESSOR) 25 MG tablet Take 25 mg by mouth daily.   simvastatin (ZOCOR) 20 MG tablet Take 20 mg by mouth at bedtime.    traMADol (ULTRAM) 50 MG tablet Take 50 mg by mouth every 6 (six) hours as needed for moderate pain.    [DISCONTINUED] apixaban (ELIQUIS) 2.5 MG TABS tablet Take 1 tablet (2.5 mg total) by mouth 2 (two) times daily.     Allergies:   Nsaids, Tolmetin, Celecoxib, Diltiazem hcl, and Venlafaxine   Social History   Tobacco Use   Smoking status: Former    Current packs/day: 0.00    Types: Cigarettes    Quit date: 05/15/1969    Years since quitting: 53.7    Passive exposure: Past   Smokeless tobacco: Never  Vaping Use   Vaping status: Never Used  Substance Use Topics   Alcohol use: No   Drug use: No     Family Hx: The patient's family history includes Cancer in his brother and father; Dementia in his mother.  ROS:   Please see the history of present illness.    Review of Systems  Constitutional:  Positive for malaise/fatigue.  Respiratory:  Positive for shortness of breath.   Cardiovascular: Negative.   Gastrointestinal: Negative.   Musculoskeletal: Negative.   Neurological: Negative.   Psychiatric/Behavioral: Negative.    All other systems reviewed and are negative.  Labs/Other Tests and Data Reviewed:    Recent Labs: 02/08/2022: BUN 20; Creatinine, Ser 1.22; Hemoglobin 13.5; Platelets 198; Potassium 4.0; Sodium 137   Recent Lipid Panel No results found for: "CHOL", "TRIG", "HDL", "CHOLHDL", "LDLCALC", "LDLDIRECT"  Wt Readings from Last 3 Encounters:   02/05/23 159 lb 2 oz (72.2 kg)  01/05/23 145 lb (65.8 kg)  12/27/22 145 lb (65.8 kg)     Exam:    BP 100/70 (BP Location: Left Arm, Patient Position: Sitting, Cuff Size: Normal)   Pulse 65   Ht 5\' 10"  (1.778 m)   Wt 159 lb 2 oz (72.2 kg)   SpO2 97%   BMI 22.83 kg/m  Constitutional:  oriented to person, place, and time. No distress.  HENT:  Head: Grossly normal Eyes:  no discharge. No scleral icterus.  Neck: No JVD, no carotid bruits  Cardiovascular: Irregularly irregular no murmurs appreciated Pulmonary/Chest: Clear to auscultation bilaterally, no wheezes or rails Abdominal: Soft.  no distension.  no tenderness.  Musculoskeletal: Normal range of motion Neurological:  normal muscle tone. Coordination normal. No atrophy Skin: Skin warm and dry Psychiatric: normal affect, pleasant   ASSESSMENT & PLAN:    Atrial flutter, typical Noted February 2023, has persisted since that time, rate controlled Previously declined cardioversion Hematuria and GI bleed on full dose anticoagulation tolerating Eliquis 2.5 twice daily which is below guidelines  Higher doses causing hematuria Also with material on Xarelto Recommend he stop metoprolol tartrate and changed to metoprolol succinate 25 daily.  He was take tartrate once a day Recommend he take metoprolol tartrate 12.5 up to 25 before going to the gym as he appreciates palpitations and elevated rate Normal ejection fraction Recommended he call us for any TIA or stroke symptoms Long discussion concerning Watchman device, he is declining at this time  Chronic diastolic CHF Appears euvolemic, EF normal Difficult time maintaining weight  Aortic atherosclerosis (HCC) Cholesterol is at goal on the current lipid regimen. No changes to the medications were made.  OSA on CPAP Managed by primary care  Weight loss/weakness Recommend to increase calorie intake Restarted at the gym    Total encounter time more than 40 minutes  Greater  than 50% was spent in counseling and coordination of care with the patient   Signed, [name], MD  02/05/2023 9:10 AM    [facility] [address], [place], [place] [address]

## 2023-02-05 ENCOUNTER — Encounter: Payer: Self-pay | Admitting: Cardiovascular Disease

## 2023-02-05 ENCOUNTER — Ambulatory Visit: Payer: Medicare Other | Attending: Cardiovascular Disease | Admitting: Cardiovascular Disease

## 2023-02-05 VITALS — BP 100/70 | HR 65 | Ht 70.0 in | Wt 159.1 lb

## 2023-02-05 DIAGNOSIS — E782 Mixed hyperlipidemia: Secondary | ICD-10-CM | POA: Insufficient documentation

## 2023-02-05 DIAGNOSIS — K922 Gastrointestinal hemorrhage, unspecified: Secondary | ICD-10-CM | POA: Diagnosis present

## 2023-02-05 DIAGNOSIS — G4733 Obstructive sleep apnea (adult) (pediatric): Secondary | ICD-10-CM | POA: Insufficient documentation

## 2023-02-05 DIAGNOSIS — I4892 Unspecified atrial flutter: Secondary | ICD-10-CM | POA: Diagnosis not present

## 2023-02-05 DIAGNOSIS — I700 Atherosclerosis of aorta: Secondary | ICD-10-CM | POA: Insufficient documentation

## 2023-02-05 DIAGNOSIS — I480 Paroxysmal atrial fibrillation: Secondary | ICD-10-CM | POA: Insufficient documentation

## 2023-02-05 MED ORDER — APIXABAN 2.5 MG PO TABS: 2.5000 mg | ORAL_TABLET | Freq: Two times a day (BID) | ORAL | 3 refills | Status: DC

## 2023-02-05 MED ORDER — METOPROLOL SUCCINATE ER 25 MG PO TB24: 25.0000 mg | ORAL_TABLET | Freq: Every day | ORAL | 3 refills | Status: DC

## 2023-02-05 MED ORDER — METOPROLOL TARTRATE 25 MG PO TABS: 25.0000 mg | ORAL_TABLET | Freq: Every day | ORAL | 3 refills | Status: DC | PRN

## 2023-02-05 NOTE — Patient Instructions (Addendum)
Medication Instructions:  Please stop the metoprolol tartrate  Start metoprolol succinate 25 mg daily Take metoprolol tartrate 12.5 up to 25 mg as needed for elevated heart rate >90 at rest  If you need a refill on your cardiac medications before your next appointment, please call your pharmacy.   Lab work: No new labs needed  Testing/Procedures: No new testing needed  Follow-Up: At [facility], you and your health needs are our priority.  As part of our continuing mission to provide you with exceptional heart care, we have created designated Provider Care Teams.  These Care Teams include your primary Cardiologist (physician) and Advanced Practice Providers (APPs -  Physician Assistants and Nurse Practitioners) who all work together to provide you with the care you need, when you need it.  You will need a follow up appointment in 6 months  Providers on your designated Care Team:   Nicolasa Ducking, NP Eula Listen, PA-C Cadence Fransico Michael, [place]  COVID-19 Vaccine Information can be found at: [email or web address] For questions related to vaccine distribution or appointments, please email [email or web address] or call 6516459958.

## 2023-02-13 ENCOUNTER — Telehealth: Payer: Self-pay | Admitting: Cardiovascular Disease

## 2023-02-13 DIAGNOSIS — I639 Cerebral infarction, unspecified: Secondary | ICD-10-CM

## 2023-02-13 HISTORY — DX: Cerebral infarction, unspecified: I63.9

## 2023-02-13 NOTE — Telephone Encounter (Signed)
Spoke to pt. He reported on Saturday, he stated he woke up drenched in sweat and legs were very weak to where he was unable to walk. Pt reported he did not fall, however he eased himself to the floor until he was able to pull himself up to get back in the bed.  Pt also reported he felt nauseated and occasional denies, however he denied fever, chills, or feeling like he's going to pass out. Morning vitals listed below.   Pt reported they only change he's made was starting metoprolol succinate on 02/05/23 and inquiring if symptoms could be related to medication.   136/79 75 123/72 101  Will forward to MD

## 2023-02-13 NOTE — Telephone Encounter (Signed)
Pt c/o medication issue:  1. Name of Medication:   metoprolol succinate (TOPROL-XL) 25 MG 24 hr tablet    2. How are you currently taking this medication (dosage and times per day)?  Take 1 tablet (25 mg total) by mouth daily. Take with or immediately following a meal.       3. Are you having a reaction (difficulty breathing--STAT)? No  4. What is your medication issue? Pt is requesting a callback regarding this medication causing him to have some dizziness and a hard time walking. Please advise

## 2023-02-14 ENCOUNTER — Observation Stay: Payer: Medicare Other

## 2023-02-14 ENCOUNTER — Ambulatory Visit (INDEPENDENT_AMBULATORY_CARE_PROVIDER_SITE_OTHER): Payer: Medicare Other

## 2023-02-14 ENCOUNTER — Ambulatory Visit (INDEPENDENT_AMBULATORY_CARE_PROVIDER_SITE_OTHER): Payer: Medicare Other | Admitting: Podiatry

## 2023-02-14 ENCOUNTER — Other Ambulatory Visit: Payer: Self-pay

## 2023-02-14 ENCOUNTER — Inpatient Hospital Stay
Admission: EM | Admit: 2023-02-14 | Discharge: 2023-02-23 | DRG: 064 | Disposition: A | Discharge status: Rehabilitation Facility | Payer: Medicare Other | Attending: Student in an Organized Health Care Education/Training Program | Admitting: Student in an Organized Health Care Education/Training Program

## 2023-02-14 ENCOUNTER — Encounter: Payer: Self-pay | Admitting: Podiatry

## 2023-02-14 VITALS — BP 155/78 | HR 71

## 2023-02-14 DIAGNOSIS — I130 Hypertensive heart and chronic kidney disease with heart failure and stage 1 through stage 4 chronic kidney disease, or unspecified chronic kidney disease: Secondary | ICD-10-CM | POA: Diagnosis present

## 2023-02-14 DIAGNOSIS — D1802 Hemangioma of intracranial structures: Secondary | ICD-10-CM | POA: Diagnosis present

## 2023-02-14 DIAGNOSIS — Z85828 Personal history of other malignant neoplasm of skin: Secondary | ICD-10-CM

## 2023-02-14 DIAGNOSIS — E162 Hypoglycemia, unspecified: Secondary | ICD-10-CM | POA: Clinically undetermined

## 2023-02-14 DIAGNOSIS — E440 Moderate protein-calorie malnutrition: Secondary | ICD-10-CM | POA: Diagnosis present

## 2023-02-14 DIAGNOSIS — I5032 Chronic diastolic (congestive) heart failure: Secondary | ICD-10-CM | POA: Diagnosis present

## 2023-02-14 DIAGNOSIS — F419 Anxiety disorder, unspecified: Secondary | ICD-10-CM | POA: Diagnosis present

## 2023-02-14 DIAGNOSIS — R7989 Other specified abnormal findings of blood chemistry: Secondary | ICD-10-CM | POA: Diagnosis not present

## 2023-02-14 DIAGNOSIS — I63541 Cerebral infarction due to unspecified occlusion or stenosis of right cerebellar artery: Secondary | ICD-10-CM | POA: Diagnosis present

## 2023-02-14 DIAGNOSIS — G629 Polyneuropathy, unspecified: Secondary | ICD-10-CM | POA: Diagnosis present

## 2023-02-14 DIAGNOSIS — Z9889 Other specified postprocedural states: Secondary | ICD-10-CM

## 2023-02-14 DIAGNOSIS — G4733 Obstructive sleep apnea (adult) (pediatric): Secondary | ICD-10-CM | POA: Diagnosis present

## 2023-02-14 DIAGNOSIS — I613 Nontraumatic intracerebral hemorrhage in brain stem: Secondary | ICD-10-CM | POA: Diagnosis present

## 2023-02-14 DIAGNOSIS — E876 Hypokalemia: Secondary | ICD-10-CM | POA: Diagnosis not present

## 2023-02-14 DIAGNOSIS — Z8546 Personal history of malignant neoplasm of prostate: Secondary | ICD-10-CM | POA: Diagnosis not present

## 2023-02-14 DIAGNOSIS — Z933 Colostomy status: Secondary | ICD-10-CM

## 2023-02-14 DIAGNOSIS — R55 Syncope and collapse: Principal | ICD-10-CM | POA: Diagnosis present

## 2023-02-14 DIAGNOSIS — F4310 Post-traumatic stress disorder, unspecified: Secondary | ICD-10-CM | POA: Diagnosis present

## 2023-02-14 DIAGNOSIS — R251 Tremor, unspecified: Secondary | ICD-10-CM | POA: Diagnosis present

## 2023-02-14 DIAGNOSIS — Z7901 Long term (current) use of anticoagulants: Secondary | ICD-10-CM

## 2023-02-14 DIAGNOSIS — R29703 NIHSS score 3: Secondary | ICD-10-CM | POA: Diagnosis present

## 2023-02-14 DIAGNOSIS — I480 Paroxysmal atrial fibrillation: Secondary | ICD-10-CM | POA: Diagnosis present

## 2023-02-14 DIAGNOSIS — I69198 Other sequelae of nontraumatic intracerebral hemorrhage: Secondary | ICD-10-CM | POA: Diagnosis not present

## 2023-02-14 DIAGNOSIS — R799 Abnormal finding of blood chemistry, unspecified: Secondary | ICD-10-CM | POA: Clinically undetermined

## 2023-02-14 DIAGNOSIS — N183 Chronic kidney disease, stage 3 unspecified: Secondary | ICD-10-CM | POA: Diagnosis present

## 2023-02-14 DIAGNOSIS — M419 Scoliosis, unspecified: Secondary | ICD-10-CM | POA: Diagnosis present

## 2023-02-14 DIAGNOSIS — R531 Weakness: Secondary | ICD-10-CM | POA: Diagnosis present

## 2023-02-14 DIAGNOSIS — I10 Essential (primary) hypertension: Secondary | ICD-10-CM | POA: Diagnosis present

## 2023-02-14 DIAGNOSIS — Z87891 Personal history of nicotine dependence: Secondary | ICD-10-CM | POA: Diagnosis not present

## 2023-02-14 DIAGNOSIS — K219 Gastro-esophageal reflux disease without esophagitis: Secondary | ICD-10-CM | POA: Diagnosis present

## 2023-02-14 DIAGNOSIS — I482 Chronic atrial fibrillation, unspecified: Secondary | ICD-10-CM | POA: Diagnosis not present

## 2023-02-14 DIAGNOSIS — Z6820 Body mass index (BMI) 20.0-20.9, adult: Secondary | ICD-10-CM

## 2023-02-14 DIAGNOSIS — I6523 Occlusion and stenosis of bilateral carotid arteries: Secondary | ICD-10-CM | POA: Diagnosis present

## 2023-02-14 DIAGNOSIS — N200 Calculus of kidney: Secondary | ICD-10-CM | POA: Diagnosis present

## 2023-02-14 DIAGNOSIS — I4892 Unspecified atrial flutter: Secondary | ICD-10-CM | POA: Diagnosis present

## 2023-02-14 DIAGNOSIS — Z888 Allergy status to other drugs, medicaments and biological substances status: Secondary | ICD-10-CM

## 2023-02-14 DIAGNOSIS — E785 Hyperlipidemia, unspecified: Secondary | ICD-10-CM | POA: Diagnosis present

## 2023-02-14 DIAGNOSIS — I619 Nontraumatic intracerebral hemorrhage, unspecified: Secondary | ICD-10-CM | POA: Diagnosis present

## 2023-02-14 DIAGNOSIS — Z7189 Other specified counseling: Secondary | ICD-10-CM | POA: Diagnosis not present

## 2023-02-14 DIAGNOSIS — G479 Sleep disorder, unspecified: Secondary | ICD-10-CM | POA: Diagnosis not present

## 2023-02-14 DIAGNOSIS — Z886 Allergy status to analgesic agent status: Secondary | ICD-10-CM

## 2023-02-14 DIAGNOSIS — Z79899 Other long term (current) drug therapy: Secondary | ICD-10-CM

## 2023-02-14 LAB — BASIC METABOLIC PANEL
Anion gap: 13 (ref 5–15)
BUN: 21 mg/dL (ref 8–23)
CO2: 27 mmol/L (ref 22–32)
Calcium: 9.5 mg/dL (ref 8.9–10.3)
Chloride: 97 mmol/L — ABNORMAL LOW (ref 98–111)
Creatinine, Ser: 1.07 mg/dL (ref 0.61–1.24)
GFR, Estimated: >60 mL/min (ref >60)
Glucose, Bld: 105 mg/dL — ABNORMAL HIGH (ref 70–99)
Potassium: 3.9 mmol/L (ref 3.5–5.1)
Sodium: 137 mmol/L (ref 135–145)

## 2023-02-14 LAB — HEPATIC FUNCTION PANEL
ALT: 14 U/L (ref 0–44)
AST: 21 U/L (ref 15–41)
Albumin: 3.7 g/dL (ref 3.5–5.0)
Alkaline Phosphatase: 80 U/L (ref 38–126)
Bilirubin, Direct: 0.2 mg/dL (ref 0.0–0.2)
Indirect Bilirubin: 1.1 mg/dL — ABNORMAL HIGH (ref 0.3–0.9)
Total Bilirubin: 1.3 mg/dL — ABNORMAL HIGH (ref 0.3–1.2)
Total Protein: 7.4 g/dL (ref 6.5–8.1)

## 2023-02-14 LAB — CBC
HCT: 44.3 % (ref 39.0–52.0)
Hemoglobin: 14.8 g/dL (ref 13.0–17.0)
MCH: 33.6 pg (ref 26.0–34.0)
MCHC: 33.4 g/dL (ref 30.0–36.0)
MCV: 100.5 fL — ABNORMAL HIGH (ref 80.0–100.0)
Platelets: 188 10*3/uL (ref 150–400)
RBC: 4.41 MIL/uL (ref 4.22–5.81)
RDW: 12.8 % (ref 11.5–15.5)
WBC: 6.5 10*3/uL (ref 4.0–10.5)
nRBC: 0 % (ref 0.0–0.2)

## 2023-02-14 LAB — MAGNESIUM: Magnesium: 1.5 mg/dL — ABNORMAL LOW (ref 1.7–2.4)

## 2023-02-14 LAB — TROPONIN I (HIGH SENSITIVITY)
Troponin I (High Sensitivity): 10 ng/L (ref <18)
Troponin I (High Sensitivity): 13 ng/L (ref <18)

## 2023-02-14 LAB — LACTIC ACID, PLASMA: Lactic Acid, Venous: 1.1 mmol/L (ref 0.5–1.9)

## 2023-02-14 LAB — PHOSPHORUS: Phosphorus: 2.7 mg/dL (ref 2.5–4.6)

## 2023-02-14 MED ORDER — EMPTY CONTAINERS FLEXIBLE MISC
900.0000 mg | Freq: Once | Status: AC
  Administered 2023-02-15: 900 mg via INTRAVENOUS
  Filled 2023-02-14: qty 90

## 2023-02-14 MED ORDER — SODIUM CHLORIDE 0.9 % IV SOLN: INTRAVENOUS | Status: DC

## 2023-02-14 MED ORDER — ACETAMINOPHEN 650 MG RE SUPP: 650.0000 mg | RECTAL | Status: DC | PRN

## 2023-02-14 MED ORDER — CLEVIDIPINE BUTYRATE 0.5 MG/ML IV EMUL
0.0000 mg/h | INTRAVENOUS | Status: DC
  Administered 2023-02-14: 2 mg/h via INTRAVENOUS
  Filled 2023-02-14 (×2): qty 50

## 2023-02-14 MED ORDER — ACETAMINOPHEN 325 MG PO TABS: 650.0000 mg | ORAL_TABLET | Freq: Four times a day (QID) | ORAL | Status: DC | PRN

## 2023-02-14 MED ORDER — ACETAMINOPHEN 160 MG/5ML PO SOLN: 650.0000 mg | ORAL | Status: DC | PRN

## 2023-02-14 MED ORDER — IOHEXOL 350 MG/ML SOLN
75.0000 mL | Freq: Once | INTRAVENOUS | Status: AC | PRN
  Administered 2023-02-14: 75 mL via INTRAVENOUS

## 2023-02-14 MED ORDER — HYDRALAZINE HCL 20 MG/ML IJ SOLN
5.0000 mg | INTRAMUSCULAR | Status: DC | PRN
Start: 2023-02-14 — End: 2023-02-14

## 2023-02-14 MED ORDER — STROKE: EARLY STAGES OF RECOVERY BOOK: Freq: Once | Status: DC

## 2023-02-14 MED ORDER — HYDRALAZINE HCL 20 MG/ML IJ SOLN: 5.0000 mg | INTRAMUSCULAR | Status: DC | PRN

## 2023-02-14 MED ORDER — SENNOSIDES-DOCUSATE SODIUM 8.6-50 MG PO TABS: 1.0000 | ORAL_TABLET | Freq: Two times a day (BID) | ORAL | Status: DC

## 2023-02-14 MED ORDER — SIMVASTATIN 20 MG PO TABS
20.0000 mg | ORAL_TABLET | Freq: Every day | ORAL | Status: DC
  Administered 2023-02-15 – 2023-02-22 (×8): 20 mg via ORAL
  Filled 2023-02-14 (×8): qty 1

## 2023-02-14 MED ORDER — ONDANSETRON HCL 4 MG/2ML IJ SOLN: 4.0000 mg | Freq: Three times a day (TID) | INTRAMUSCULAR | Status: DC | PRN

## 2023-02-14 MED ORDER — HYDRALAZINE HCL 20 MG/ML IJ SOLN
10.0000 mg | INTRAMUSCULAR | Status: DC | PRN
  Administered 2023-02-16: 20 mg via INTRAVENOUS
  Filled 2023-02-14: qty 1

## 2023-02-14 MED ORDER — ACETAMINOPHEN 325 MG PO TABS
650.0000 mg | ORAL_TABLET | ORAL | Status: DC | PRN
  Administered 2023-02-15 – 2023-02-23 (×6): 650 mg via ORAL
  Filled 2023-02-14 (×6): qty 2

## 2023-02-14 MED ORDER — PANTOPRAZOLE SODIUM 40 MG IV SOLR
40.0000 mg | Freq: Every day | INTRAVENOUS | Status: DC
  Administered 2023-02-15: 40 mg via INTRAVENOUS
  Filled 2023-02-14: qty 10

## 2023-02-14 MED ORDER — MAGNESIUM SULFATE 2 GM/50ML IV SOLN
2.0000 g | Freq: Once | INTRAVENOUS | Status: AC
  Administered 2023-02-14: 2 g via INTRAVENOUS
  Filled 2023-02-14: qty 50

## 2023-02-14 MED ORDER — IOHEXOL 350 MG/ML SOLN
100.0000 mL | Freq: Once | INTRAVENOUS | Status: AC | PRN
  Administered 2023-02-14: 100 mL via INTRAVENOUS

## 2023-02-14 NOTE — ED Triage Notes (Signed)
Pt comes over from [facility]. Per Kc, pt has been near syncopal episodes with being diaphoretic and dizzy with weakness and dry heaving.

## 2023-02-14 NOTE — Progress Notes (Signed)
Subjective:  Patient ID: Ryan Allison, male    DOB: 06-Feb-1941,  MRN: [id number]  Chief Complaint  Patient presents with   Routine Post Op    POV # 3 DOS 01/05/23 --- 3RD TOE CORRECTION, INJECTION OF FAT PAD GRAFT LEFT FOOT "I think overall, it's doing good.  I feel a little bit of the fat graft,"     82 y.o. male returns for post-op check.    Review of Systems: Negative except as noted in the HPI. Denies N/V/F/Ch.   Objective:   Vitals:   02/14/23 1331  BP: (!) 155/78  Pulse: 71   There is no height or weight on file to calculate BMI. Constitutional Well developed. Well nourished.  Vascular Foot warm and well perfused. Capillary refill normal to all digits.  Calf is soft and supple, no posterior calf or knee pain, negative Homans' sign  Neurologic Normal speech. Oriented to person, place, and time. Epicritic sensation to light touch grossly present bilaterally.  Dermatologic Incision well-healed not hypertrophic callus is peeling off no evidence of recurrence  Orthopedic: He has little to no swelling and no pain in the toe   Multiple view plain film radiographs: Interval resection of middle and proximal phalanx left third toe, unchanged appearance of alignment Assessment:   1. Status post surgery    Plan:  Patient was evaluated and treated and all questions answered.  S/p foot surgery left -Doing very well.  I have no further restrictions for him.  We discussed that hopefully should offer a long-term result.  Fat pad grafting should get maximal benefit around 12 weeks and hopefully should last a couple years.  Return to see me as needed if there is further issues  No follow-ups on file.

## 2023-02-14 NOTE — Plan of Care (Signed)
On-call overnight neurology note  Called by Dr. Valda Lamb EDP. Patient with near syncopal episode, diaphoresis dizziness and dry heaving. 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 Patient on Eliquis-which she was off of for a few days for a foot procedure but then resumed and took a dose this morning.  This is a small bleed.  I do not expect or anticipate him requiring surgical intervention.  Impression: Small coagulopathic brainstem ICH  Recommendations Admit to ICU-PCCM primary at [facility] use the intracranial hemorrhage none traumatic admission order set. Consult in person neurologist in the morning. Until then, reverse Eliquis with Andexxa for now-follow the reversal protocol in epic. Systolic blood pressure goal 1 30-1 50 CTA head and neck with 5-minute delay Repeat head CT at 4:30 AM.  Please call if any increase in size of the ICH Please feel free to reach out to me with questions.  -- Milon Dikes, MD Neurologist [facility] Pager: [phone]

## 2023-02-14 NOTE — H&P (Incomplete)
History and Physical    Ryan Allison ZOX:[id number] DOB: 09/04/1940 DOA: 02/14/2023  Referring MD/NP/PA:   PCP: Lynnea Ferrier, MD   Patient coming from:  The patient is coming from home.     Chief Complaint: near syncope  HPI: Ryan Allison is a 82 y.o. male with medical history significant of HTN,  HLD, A fib/flutter on Eliquis, diverticular bleeding, small bowel obstruction, prostate cancer (s/p of radiation therapy), anxiety, OSA not using CPAP, recent hammertoe correction, who presents with near syncope.       Data reviewed independently and ED Course: pt was found to have     ***       EKG: I have personally reviewed.  Not done in ED, will get one.   ***   Review of Systems:   General: no fevers, chills, no body weight gain, has poor appetite, has fatigue HEENT: no blurry vision, hearing changes or sore throat Respiratory: no dyspnea, coughing, wheezing CV: no chest pain, no palpitations GI: no nausea, vomiting, abdominal pain, diarrhea, constipation GU: no dysuria, burning on urination, increased urinary frequency, hematuria  Ext: no leg edema Neuro: no unilateral weakness, numbness, or tingling, no vision change or hearing loss Skin: no rash, no skin tear. MSK: No muscle spasm, no deformity, no limitation of range of movement in spin Heme: No easy bruising.  Travel history: No recent long distant travel.   Allergy:  Allergies  Allergen Reactions   Nsaids Other (See Comments)    Diverticular bleed   Tolmetin     Diverticular bleed   Celecoxib Itching and Rash   Diltiazem Hcl Rash   Venlafaxine Other (See Comments)    sedation    Past Medical History:  Diagnosis Date   Amblyopia of right eye    Anxiety    Aortic atherosclerosis (HCC)    Arthritis    Atrial fibrillation and flutter (HCC) 05/2018   a.) CHA2DS2VASc = 4 (age x 2, HTN, vascular disease history); b.) rate/rhythm maintained on oral metoprolol tartrate; chronically anticoagulated  with dose reduced apixaban (experienced hematuria on rivaroxaban)   B12 deficiency    Cervical spondylosis with myelopathy    Chronic kidney disease, stage 3 (HCC)    DDD (degenerative disc disease), lumbar    Diverticulosis    Esophageal dysphagia    External hemorrhoids    GERD (gastroesophageal reflux disease)    GI bleed    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   History of echocardiogram    a.) TTE 06/07/2018: EF 60-65%, no RWMAs, mild MR, PASP 33 mmHg; b.) TTE 09/19/2019: EF 50-55%, mild LVH, mod RVE, mild LA dil, mild-mod TR, mild MR, PASP 42.5; c.) TTE 09/14/2022: EF 60-65%, mod basal-septal LVH, mild MR/AR, mod TR, AoV sclerosis, Ao root 37 mm   History of GI diverticular bleed    a.) required prolonged ICU admission at [facility]   History of kidney stones    HLD (hyperlipidemia)    Hyperlipidemia    Hypertension    Kyphoscoliosis    Meniscus tear    On apixaban therapy    OSA (obstructive sleep apnea)    a.) does not require nocturnal PAP therapy   Osteopenia    Peripheral neuropathy    Prostate cancer (HCC) 2011   PTSD (post-traumatic stress disorder)    RBBB (right bundle branch block)    SBO (small bowel obstruction) (HCC)    Skin cancer of scalp     Past Surgical History:  Procedure  Laterality Date   COLECTOMY  07/12/2018   with end ileostomy   COLONOSCOPY Left 06/07/2018   Procedure: COLONOSCOPY;  Surgeon: Pasty Spillers, MD;  Location[facility] ENDOSCOPY;  Service: Endoscopy;  Laterality: Left;   COLONOSCOPY WITH ESOPHAGOGASTRODUODENOSCOPY (EGD)  09/12/2010   COLONOSCOPY WITH PROPOFOL N/A 04/06/2015   Procedure: COLONOSCOPY WITH PROPOFOL;  Surgeon: Christena Deem, MD;  Location: [facility] ENDOSCOPY;  Service: Endoscopy;  Laterality: N/A;   COLONOSCOPY WITH PROPOFOL N/A 06/07/2018   Procedure: COLONOSCOPY WITH PROPOFOL;  Surgeon: Pasty Spillers, MD;  Location: [facility] ENDOSCOPY;  Service: Endoscopy;  Laterality: N/A;   HAMMER TOE SURGERY Left 01/05/2023   Procedure:  HAMMER TOE CORRECTION; INJECTION OF FAT PAD GRAFT;  Surgeon: [name], DPM;  Location: [facility] ORS;  Service: Podiatry;  Laterality: Left;   INGUINAL HERNIA REPAIR Left 09/23/2014   Procedure: HERNIA REPAIR INGUINAL ADULT;  Surgeon: Kieth Brightly, MD;  Location: [facility] ORS;  Service: General;  Laterality: Left;   LASIK  2000   MENISCUS REPAIR Left 05/15/2006   MOHS SURGERY     right scalp   SKIN GRAFT FULL THICKNESS ARM Right 1966   [place]   STRABISMUS SURGERY Right 07/17/2016   VASCULAR SURGERY  05/15/2013   s/p GI Bleed    Social History:  reports that he quit smoking about 53 years ago. His smoking use included cigarettes. He has been exposed to tobacco smoke. He has never used smokeless tobacco. He reports that he does not drink alcohol and does not use drugs.  Family History:  Family History  Problem Relation Age of Onset   Dementia Mother    Cancer Father    Cancer Brother      Prior to Admission medications   Medication Sig Start Date End Date Taking? Authorizing Provider  acetaminophen (TYLENOL) 500 MG tablet Take 500 mg by mouth daily as needed for fever or pain.     [provider]  apixaban (ELIQUIS) 2.5 MG TABS tablet Take 1 tablet (2.5 mg total) by mouth 2 (two) times daily. 02/05/23   Antonieta Iba, MD  cyanocobalamin 1000 MCG tablet Take 1,000 mcg by mouth daily.    [provider]  gabapentin (NEURONTIN) 600 MG tablet Take 600 mg by mouth 4 (four) times daily.    [provider]  loperamide (IMODIUM) 2 MG capsule Take 2 mg by mouth daily as needed for diarrhea or loose stools.    [provider]  metoprolol succinate (TOPROL-XL) 25 MG 24 hr tablet Take 1 tablet (25 mg total) by mouth daily. Take with or immediately following a meal. 02/05/23 05/06/23  Gollan, Tollie Pizza, MD  metoprolol tartrate (LOPRESSOR) 25 MG tablet Take 1 tablet (25 mg total) by mouth daily as needed (take 12.5 mg up to 25 mg as needed for  resting heart rate greater then 90.). 02/05/23   Antonieta Iba, MD  simvastatin (ZOCOR) 20 MG tablet Take 20 mg by mouth at bedtime.     [provider]  traMADol (ULTRAM) 50 MG tablet Take 50 mg by mouth every 6 (six) hours as needed for moderate pain.     [provider]    Physical Exam: Vitals:   02/14/23 1605 02/14/23 1607 02/14/23 1611 02/14/23 1830  BP:   (!) 142/93 (!) 148/78  Pulse:  77  77  Resp:  19  20  Temp:  98.2 F (36.8 C)    TempSrc:  Oral    SpO2:  96%  96%  Weight: 72.2 kg     Height: 5\' 10"  (1.778 m)      General: Not in acute distress HEENT:       Eyes: PERRL, EOMI, no jaundice       ENT: No discharge from the ears and nose, no pharynx injection, no tonsillar enlargement.        Neck: No JVD, no bruit, no mass felt. Heme: No neck lymph node enlargement. Cardiac: S1/S2, RRR, No murmurs, No gallops or rubs. Respiratory: No rales, wheezing, rhonchi or rubs. GI: Soft, nondistended, nontender, no rebound pain, no organomegaly, BS present. GU: No hematuria Ext: No pitting leg edema bilaterally. 1+DP/PT pulse bilaterally. Musculoskeletal: No joint deformities, No joint redness or warmth, no limitation of ROM in spin. Skin: No rashes.  Neuro: Alert, oriented X3, cranial nerves II-XII grossly intact, moves all extremities normally. Muscle strength 5/5 in all extremities, sensation to light touch intact. Brachial reflex 2+ bilaterally. Knee reflex 1+ bilaterally. Negative Babinski's sign. Normal finger to nose test. Psych: Patient is not psychotic, no suicidal or hemocidal ideation.  Labs on Admission: I have personally reviewed following labs and imaging studies  CBC: Recent Labs  Lab 02/14/23 1628  WBC 6.5  HGB 14.8  HCT 44.3  MCV 100.5*  PLT 188   Basic Metabolic Panel: Recent Labs  Lab 02/14/23 1628 02/14/23 1740  NA 137  --   K 3.9  --   CL 97*  --   CO2 27  --   GLUCOSE 105*  --   BUN 21  --   CREATININE 1.07  --    CALCIUM 9.5  --   MG  --  1.5*   GFR: Estimated Creatinine Clearance: 56.2 mL/min (by C-G formula based on SCr of 1.07 mg/dL). Liver Function Tests: Recent Labs  Lab 02/14/23 1740  AST 21  ALT 14  ALKPHOS 80  BILITOT 1.3*  PROT 7.4  ALBUMIN 3.7   No results for input(s): "LIPASE", "AMYLASE" in the last 168 hours. No results for input(s): "AMMONIA" in the last 168 hours. Coagulation Profile: No results for input(s): "INR", "PROTIME" in the last 168 hours. Cardiac Enzymes: No results for input(s): "CKTOTAL", "CKMB", "CKMBINDEX", "TROPONINI" in the last 168 hours. BNP (last 3 results) No results for input(s): "PROBNP" in the last 8760 hours. HbA1C: No results for input(s): "HGBA1C" in the last 72 hours. CBG: No results for input(s): "GLUCAP" in the last 168 hours. Lipid Profile: No results for input(s): "CHOL", "HDL", "LDLCALC", "TRIG", "CHOLHDL", "LDLDIRECT" in the last 72 hours. Thyroid Function Tests: No results for input(s): "TSH", "T4TOTAL", "FREET4", "T3FREE", "THYROIDAB" in the last 72 hours. Anemia Panel: No results for input(s): "VITAMINB12", "FOLATE", "FERRITIN", "TIBC", "IRON", "RETICCTPCT" in the last 72 hours. Urine analysis:    Component Value Date/Time   COLORURINE YELLOW (A) 06/25/2021 1341   APPEARANCEUR Cloudy (A) 04/19/2022 1357   LABSPEC 1.025 06/25/2021 1341   PHURINE 5.0 06/25/2021 1341   GLUCOSEU Negative 04/19/2022 1357   HGBUR SMALL (A) 06/25/2021 1341   BILIRUBINUR Negative 04/19/2022 1357   KETONESUR 80 (A) 06/25/2021 1341   PROTEINUR 2+ (A) 04/19/2022 1357   PROTEINUR 100 (A) 06/25/2021 1341   NITRITE Negative 04/19/2022 1357   NITRITE NEGATIVE 06/25/2021 1341   LEUKOCYTESUR Negative 04/19/2022 1357   LEUKOCYTESUR NEGATIVE 06/25/2021 1341   Sepsis Labs: @LABRCNTIP (procalcitonin:4,lacticidven:4) )No results found for this or any previous visit (from the past 240 hour(s)).   Radiological Exams on Admission: DG Foot Complete  Left  Result Date: 02/14/2023 Please see detailed radiograph report in office note.     Assessment/Plan Active Problems:   * No active hospital problems. *   Assessment and Plan: No notes have been filed under this hospital service. Service: Hospitalist      Active Problems:   * No active hospital problems. *    DVT ppx: SQ Heparin         SQ Lovenox  Code Status: Full code   ***  Family Communication: not done, no family member is at bed side.              Yes, patient's    at bed side.       by phone  Disposition Plan:  Anticipate discharge back to previous environment  Consults called:    Admission status and Level of care: :    Med-surg bed for obs as inpt    progressive unit for obs   as inpt      SDU/inpation        {Inpatient:23812}  Dispo: The patient is from: {From:23814}              Anticipated d/c is to: {To:23815}              Anticipated d/c date is: {Days:23816}              Patient currently {Medically stable:23817}    Severity of Illness:  {Observation/Inpatient:21159}       Date of Service 02/14/2023    Lorretta Harp [facility]   If 7PM-7AM, please contact night-coverage [email or web address] 02/14/2023, 9:16 PM

## 2023-02-14 NOTE — H&P (Signed)
NAME:  Ryan Allison, MRN:  [id number], DOB:  1941-02-12, LOS: 0 ADMISSION DATE:  02/14/2023, CONSULTATION DATE:  02/14/23 REFERRING MD:  Dr. Erma Heritage, CHIEF COMPLAINT:  Near Syncope   History of Present Illness:  82 yo M presenting to [facility] ED from home for evaluation of near syncope.  History obtained per chart review and bedside patient report. Patient reports being in his normal state of health until early in the morning on 10/2. He was at home in bed when he began to feel extremely weak with some tremors in his Right leg and sat down. He then describes breaking out in a sweat and too weak to get off the ground. This lasted for 30 minutes-1 hour. He ultimately was able to get himself back up and into bed and go back to sleep. He had another recurrence of symptoms when he got out of bed for the day that included nausea without vomiting. He does endorse feeling tired and lightheaded as well as a headache. He denied pain, blurred vision, shortness of breath, chest pain, no overt palpitations.  Of note patient on a reduced dose of Eliquis at 2.5 mg BID due to history of diverticular bleeding and hematuria. Patient has declined Watchman device/ cardioversion previously.  ED course: Upon arrival patient alert and oriented with stable vitals in Atrial flutter. Labs reassuring with the exception of hypomagnesemia. Imaging revealed small pontine ICH. NSUR alerted, patient not a candidate for surgical intervention at this time, recommending Neurology consultation. Neurology recommended admission to ICU with strict BP control 130-150 and follow up imaging to monitor ICH overnight. Eliquis reversed. Medications given: Andexxa, Cleviprex drip started, Mg 2g, IV contrast Initial Vitals: 98.2/ 71/ 155/78 & 96 % on RA Significant labs: (Labs/ Imaging personally reviewed) I, Cheryll Cockayne Rust-Chester, AGACNP-BC, personally viewed and interpreted this ECG. EKG Interpretation: Date: 02/14/23, EKG Time: 16:07, Rate:  83, Rhythm: A-flutter, QRS Axis:  LAD, Intervals: iRBBB, a-flutter, ST/T Wave abnormalities: none, Narrative Interpretation: controlled A-flutter Chemistry: Na+:137, K+: 3.9, Cl: 97, BUN/Cr.: 21/ 1.07, Serum CO2/ AG: 27/ 13, Mg: 1.5, T.Bili: 1.3 Hematology: WBC: 6.5, Hgb: 14.8,  Troponin: 10 > 13, Lactic: 1.1  CT angio chest PE 02/14/23: No evidence of pulmonary emboli. 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 8 mm left upper pole renal stone CT head wo contrast 02/14/23: Small acute pontine hemorrhage measuring approximately 8 x 5 mm. CT angio head/neck w/wo contrast 02/14/23: No emergent large vessel occlusion or hemodynamically significant stenosis of the head or neck. Mild bilateral carotid bifurcation atherosclerosis without hemodynamically significant stenosis. Unchanged size of pontine intraparenchymal hematoma.   PCCM consulted for admission due to Small pontine ICH on cleviprex drip for HTN.  Pertinent  Medical History  Atrial flutter on Eliquis Diverticular bleed Prostate cancer s/p RT OSA (not on CPAP) Anxiety HTN HLD Prior tobacco use  Significant Hospital Events: Including procedures, antibiotic start and stop dates in addition to other pertinent events   02/15/23: Admit to ICU with small pontine ICH secondary to HTN and Eliquis administration on Cleviprex drip. Neurology following  Interim History / Subjective:  Patient alert and oriented, plan of care discussed, all questions and concerns answered at this time.  Objective   Blood pressure (!) 148/78, pulse 77, temperature 98.2 F (36.8 C), resp. rate 20, height 5\' 10"  (1.778 m), weight 72.2 kg, SpO2 96%.        Intake/Output Summary (Last 24 hours) at 02/14/2023 2356 Last data filed at 02/14/2023 2125 Gross per 24 hour  Intake 50 ml  Output --  Net 50 ml   Filed Weights   02/14/23 1605  Weight: 72.2 kg    Examination: General: Adult male, acutely ill, lying in bed, NAD HEENT: MM pink/moist,  anicteric, atraumatic, neck supple Neuro: A&O x 4, able to follow commands, PERRL +3, MAE CN: II - partial hemianopia on the L side, glasses present III, IV, VI - Extraocular movements intact. V - Facial sensation intact bilaterally. VII - Facial movement intact bilaterally VIII - Hearing & vestibular intact bilaterally X - Palate elevates symmetrically XI - Chin turning & shoulder shrug intact bilaterally. XII - Tongue protrusion intact Motor Strength - strength 5/5 all extremities and pronator drift was absent.  Sensory - Light touch was assessed and was symmetrical Coordination - The patient had normal movements in the hands and feet with no ataxia or dysmetria.  Tremor was absent Gait and Station - deferred.  NIHSS Level of  Consciousness: 0 = Alert, keenly responsive; 1 = Not alert, but arousable by minor stimulation to obey, answer, or respond; 2 = Not alert, requires repeated stimulation to attend, or is obtunded and requires strong or painful stimulation to make movements (not stereotyped); 3 = Responds only with reflex motor or autonomic effects or totally unresponsive, flaccid, and areflexic   0  LOC Questions 0 = Answers both questions correctly; 1 = Answers one question correctly; 2 = Answers neither question correctly.  1  LOC Commands 0 = Performs both tasks correctly; 1 = Performs one task correctly; 2 = Performs neither task correctly  0  Best Gaze  0 = Normal; 1 = Partial gaze palsy; gaze is abnormal in one or both eyes, but forced deviation or total gaze paresis is not present; 2 = Forced deviation, or total gaze paresis not overcome by the oculocephalic maneuver   0  Visual 0 = No visual loss; 1 = Partial hemianopia; 2 = Complete hemianopia; 3 = Bilateral hemianopia (blind including cortical blindness).   1  Facial Palsy 0 = Normal symmetrical movements; 1 = Minor paralysis (flattened nasolabial fold, asymmetry on smiling); 2 = Partial paralysis (total or near-total  paralysis of lower face); 3 = Complete paralysis of one or both sides (absence of facial movement in the upper and lower face).  0  Motor Arm LEFT 0 = No drift; limb holds 90 (or 45) degrees for full 10 secs; 1 = Drift; limb holds 90 (or 45) degrees, but drifts down before full 10 seconds; does not hit bed or other support; 2 = Some effort against gravity; limb cannot get to or maintain (if cued) 90 (or 45) degrees, drifts down to bed, but has some effort against gravity; 3 = No effort against gravity; limb falls; 4 = No movement; UN = unable to test (Amputation or joint fusion).   0  Motor Arm RIGHT 0 = No drift; limb holds 90 (or 45) degrees for full 10 secs; 1 = Drift; limb holds 90 (or 45) degrees, but drifts down before full 10 seconds; does not hit bed or other support; 2 = Some effort against gravity; limb cannot get to or maintain (if cued) 90 (or 45) degrees, drifts down to bed, but has some effort against gravity; 3 = No effort against gravity; limb falls; 4 = No movement; UN = unable to test (Amputation or joint fusion).   0  Motor Leg LEFT 0 = No drift; leg holds 30 degree position for full 5 secs; 1 =  Drift; leg falls by the end of the 5-sec period but does not hit bed; 2 = Some effort against gravity; leg falls to bed by 5 secs, but has some effort against gravity; 3 = No effort against gravity; leg falls to bed immediately; 4 = No movement; UN = unable to test (Amputation or joint fusion).   0  Motor Leg RIGHT 0 = No drift; leg holds 30 degree position for full 5 secs; 1 = Drift; leg falls by the end of the 5-sec period but does not hit bed; 2 = Some effort against gravity; leg falls to bed by 5 secs, but has some effort against gravity; 3 = No effort against gravity; leg falls to bed immediately; 4 = No movement; UN = unable to test (Amputation or joint fusion).   0  Limb Ataxia 0 = Absent; 1 = Present in one limb; 2 = Present in two limbs; UN = Amputation or joint fusion   0  Sensory  0 =  Normal, no sensory loss; 1 = Mild-to-moderate sensory loss, patient feels pinprick is less sharp or is dull on the affected side, or there is a loss of superficial pain with pinprick, but patient is aware of being touched; 2 = Severe to total sensory loss, patient is not aware of being touched in the face, arm, and leg.   0  Best Language 0 = No aphasia; normal; 1 = Mild-to-moderate aphasia; some obvious loss of fluency or facility of comprehension, w/o significant limitation on ideas expressed or form of expression. Reduction of speech and/or comprehension, however, makes conversation about provided materials difficult or impossible. For example, in conversation about provided materials, examiner can identify picture or naming card content from patient's response; 2 = Severe aphasia; all communication is through fragmentary expression; great need for inference, questioning, and guessing by the listener. Range of information that can be exchanged is limited; listener carries burden of communication. Examiner cannot identify materials provided from patient response; 3 = Mute, global aphasia; no usable speech or auditory comprehension  1  Dysarthria 0 = Normal; 1 = Mild-to-moderate dysarthria, patient slurs at least some words and, at worst, can be understood with some difficulty; 2 = Severe dysarthria, patient's speech is so slurred as to be unintelligible in the absence of or out of proportion to any dysphasia, or is mute/anarthric; UN = Intubated or other physical barrier  0  Extinction/Inattention 0 = No abnormality 1 = Visual, tactile, auditory, spatial, or personal extinction/inattention to bilateral simultaneous stimulation in one of the sensory modalities. 2 = Profound hemi-inattention or extinction to more than one modality; does not recognize own hand or orients to only one side of space.  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 While in ED patient SBP > 150 and cleviprex started for better control - Goal SBP 130 -150, PRN hydralazine & continue Cleviprex drip wean as tolerated to maintain BP goal,  - HOB >= 30 degrees  - Q1 neuro checks  - follow up  CT head wo contrast @ 4:30 am (or sooner if clinical worsening) - INR goal <1.4, daily coags  - No HepSQ, antiplatelet or anticoagulant agents at this time - Maintain strict euglycemia, euthermia, and euvolemia  - NPO, bedside swallow eval - PT/OT eval - fall precautions - Neurology following, consult neurosurgery if needed  Chronic Controlled Atrial Flutter Hypertension Hyperlipidemia PMHx: HFpEF - Cleviprex as above - outpatient Eliquis on hold due to small ICH - continue outpatient metoprolol for rate control - continue simvastatin - Continuous cardiac monitoring  OSA Patient confirms he has not worn a CPAP for the last 40 years - continuous spo2 monitoring - supplemental O2 PRN to maintain SpO2 > 92%  Best Practice (right click and "Reselect all SmartList Selections" daily)  Diet/type: NPO w/ oral meds DVT prophylaxis: SCD GI prophylaxis: PPI Lines: N/A Foley:  N/A Code Status:  full code Last date of multidisciplinary goals of care discussion [02/15/23]  Labs   CBC: Recent Labs  Lab 02/14/23 1628  WBC 6.5  HGB 14.8  HCT 44.3  MCV 100.5*  PLT 188    Basic Metabolic Panel: Recent Labs  Lab 02/14/23 1628 02/14/23 1740 02/14/23 1806  NA 137  --   --   K 3.9  --   --   CL 97*  --   --   CO2 27  --   --   GLUCOSE 105*  --   --    BUN 21  --   --   CREATININE 1.07  --   --   CALCIUM 9.5  --   --   MG  --  1.5*  --   PHOS  --   --  2.7   GFR: Estimated Creatinine Clearance: 56.2 mL/min (by C-G formula based on SCr of 1.07 mg/dL). Recent Labs  Lab 02/14/23 1628 02/14/23 1740  WBC 6.5  --   LATICACIDVEN  --  1.1    Liver Function Tests: Recent Labs  Lab 02/14/23 1740  AST 21  ALT 14  ALKPHOS 80  BILITOT 1.3*  PROT 7.4  ALBUMIN 3.7   No results for input(s): "LIPASE", "AMYLASE" in the last 168 hours. No results for input(s): "AMMONIA" in the last 168 hours.  ABG No results found for: "PHART", "PCO2ART", "PO2ART", "HCO3", "TCO2", "ACIDBASEDEF", "O2SAT"   Coagulation Profile: No results for input(s): "INR", "PROTIME" in the last 168 hours.  Cardiac Enzymes: No results for input(s): "CKTOTAL", "CKMB", "CKMBINDEX", "TROPONINI" in the last 168 hours.  HbA1C: Hgb A1c MFr Bld  Date/Time Value Ref Range Status  06/06/2018 03:08 PM 5.5 4.8 - 5.6 % Final    Comment:    (NOTE) Pre diabetes:          5.7%-6.4% Diabetes:              >6.4% Glycemic control for   <7.0% adults with diabetes     CBG: No results for input(s): "GLUCAP" in the last 168 hours.  Review of Systems: Positives in BOLD  Gen: Denies fever, chills, weight change, fatigue, night sweats HEENT: Denies blurred vision, double vision, hearing loss, tinnitus, sinus congestion, rhinorrhea, sore throat, neck stiffness, dysphagia PULM: Denies shortness of breath, cough, sputum production, hemoptysis, wheezing CV: Denies chest pain, edema, orthopnea, paroxysmal nocturnal dyspnea, palpitations GI: Denies abdominal pain, nausea, vomiting, diarrhea, hematochezia, melena, constipation, change in bowel habits GU: Denies dysuria, hematuria, polyuria, oliguria, urethral discharge Endocrine: Denies hot or cold intolerance, polyuria, polyphagia or appetite change Derm: Denies rash, dry  skin, scaling or peeling skin change Heme: Denies easy  bruising, bleeding, bleeding gums Neuro: Denies headache, numbness, weakness, tremors, slurred speech, loss of memory or consciousness  Past Medical History:  He,  has a past medical history of Amblyopia of right eye, Anxiety, Aortic atherosclerosis (HCC), Arthritis, Atrial fibrillation and flutter (HCC) (05/2018), B12 deficiency, Cervical spondylosis with myelopathy, Chronic kidney disease, stage 3 (HCC), DDD (degenerative disc disease), lumbar, Diverticulosis, Esophageal dysphagia, External hemorrhoids, GERD (gastroesophageal reflux disease), GI bleed, Hammertoe of left foot (12/2022), History of echocardiogram, History of GI diverticular bleed, History of kidney stones, HLD (hyperlipidemia), Hyperlipidemia, Hypertension, Kyphoscoliosis, Meniscus tear, On apixaban therapy, OSA (obstructive sleep apnea), Osteopenia, Peripheral neuropathy, Prostate cancer (HCC) (2011), PTSD (post-traumatic stress disorder), RBBB (right bundle branch block), SBO (small bowel obstruction) (HCC), and Skin cancer of scalp.   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   COLONOSCOPY Left 06/07/2018   Procedure: COLONOSCOPY;  Surgeon: Pasty Spillers, MD;  Location: [facility] ENDOSCOPY;  Service: Endoscopy;  Laterality: Left;   COLONOSCOPY WITH ESOPHAGOGASTRODUODENOSCOPY (EGD)  09/12/2010   COLONOSCOPY WITH PROPOFOL N/A 04/06/2015   Procedure: COLONOSCOPY WITH PROPOFOL;  Surgeon: Christena Deem, MD;  Location: [facility] ENDOSCOPY;  Service: Endoscopy;  Laterality: N/A;   COLONOSCOPY WITH PROPOFOL N/A 06/07/2018   Procedure: COLONOSCOPY WITH PROPOFOL;  Surgeon: Pasty Spillers, MD;  Location: [facility] ENDOSCOPY;  Service: Endoscopy;  Laterality: N/A;   HAMMER TOE SURGERY Left 01/05/2023   Procedure: HAMMER TOE CORRECTION; INJECTION OF FAT PAD GRAFT;  Surgeon: [name], DPM;  Location: [facility] ORS;  Service: Podiatry;  Laterality: Left;   INGUINAL  HERNIA REPAIR Left 09/23/2014   Procedure: HERNIA REPAIR INGUINAL ADULT;  Surgeon: Kieth Brightly, MD;  Location: [facility] ORS;  Service: General;  Laterality: Left;   LASIK  2000   MENISCUS REPAIR Left 05/15/2006   MOHS SURGERY     right scalp   SKIN GRAFT FULL THICKNESS ARM Right 1966   [place]   STRABISMUS SURGERY Right 07/17/2016   VASCULAR SURGERY  05/15/2013   s/p GI Bleed     Social History:   reports that he quit smoking about 53 years ago. His smoking use included cigarettes. He has been exposed to tobacco smoke. He has never used smokeless tobacco. He reports that he does not drink alcohol and does not use drugs.   Family History:  His family history includes Cancer in his brother and father; Dementia in his mother.   Allergies Allergies  Allergen Reactions   Nsaids Other (See Comments)    Diverticular bleed   Tolmetin     Diverticular bleed   Celecoxib Itching and Rash   Diltiazem Hcl Rash   Venlafaxine Other (See Comments)    sedation     Home Medications  Prior to Admission medications   Medication Sig Start Date End Date Taking? Authorizing Provider  acetaminophen (TYLENOL) 500 MG tablet Take 500 mg by mouth daily as needed for fever or pain.     [provider]  apixaban (ELIQUIS) 2.5 MG TABS tablet Take 1 tablet (2.5 mg total) by mouth 2 (two) times daily. 02/05/23   Antonieta Iba, MD  cyanocobalamin 1000 MCG tablet Take 1,000 mcg by mouth daily.    [provider]  gabapentin (NEURONTIN) 600 MG tablet Take 600 mg by mouth 4 (four) times daily.    [provider]  loperamide (IMODIUM)  2 MG capsule Take 2 mg by mouth daily as needed for diarrhea or loose stools.    [provider]  metoprolol succinate (TOPROL-XL) 25 MG 24 hr tablet Take 1 tablet (25 mg total) by mouth daily. Take with or immediately following a meal. 02/05/23 05/06/23  Gollan, Tollie Pizza, MD  metoprolol tartrate (LOPRESSOR) 25 MG tablet Take 1  tablet (25 mg total) by mouth daily as needed (take 12.5 mg up to 25 mg as needed for resting heart rate greater then 90.). 02/05/23   Antonieta Iba, MD  simvastatin (ZOCOR) 20 MG tablet Take 20 mg by mouth at bedtime.     [provider]  traMADol (ULTRAM) 50 MG tablet Take 50 mg by mouth every 6 (six) hours as needed for moderate pain.     [provider]     Critical care time: 68 minutes       Betsey Holiday, AGACNP-BC Acute Care Nurse Practitioner [facility]   [phone] / [phone] Please see Amion for details.

## 2023-02-14 NOTE — ED Provider Notes (Signed)
[facility] Provider Note    Event Date/Time   First MD Initiated Contact with Patient 02/14/23 1615     (approximate)   History   Near Syncope   HPI  Ryan Allison is a 82 y.o. male  with PMHx AFlutter, HTN, DDD, HLD, OSA, on eliquis, here with near syncope and weakness. Pt reports earlier today he was in his home when he began to feel very weak. He sat himself down. He then broke out in a sweat and felt extremely weak, and was unable to get himself off the ground. This lasted for 30 min to an hour. He ultimately was able to to get himself back up and into bed. He has had recurrence of sx since then and subsequently presents for evaluation. Currently feels tired but no pain. Denies any CP during episode. Reports he did feel lightheaded. No overt palpitations. Of note, he recently switched metoprolol from tartrate to succinate, and yestserday was his first changed dose.       Physical Exam   Triage Vital Signs: ED Triage Vitals  Encounter Vitals Group     BP 02/14/23 1611 (!) 142/93     Systolic BP Percentile --      Diastolic BP Percentile --      Pulse Rate 02/14/23 1607 77     Resp 02/14/23 1607 19     Temp 02/14/23 1607 98.2 F (36.8 C)     Temp Source 02/14/23 1607 Oral     SpO2 02/14/23 1607 96 %     Weight 02/14/23 1605 159 lb 2 oz (72.2 kg)     Height 02/14/23 1605 5\' 10"  (1.778 m)     Head Circumference --      Peak Flow --      Pain Score 02/14/23 1605 0     Pain Loc --      Pain Education --      Exclude from Growth Chart --     Most recent vital signs: Vitals:   02/15/23 0007 02/15/23 0030  BP:  (!) 142/78  Pulse: 69 69  Resp: 18 10  Temp:    SpO2:       General: Awake, no distress.  CV:  Good peripheral perfusion. Tachycardic, irregular Resp:  Normal work of breathing. Lungs clear. Abd:  No distention. No tenderness. Other:  CNII-XII intact. Strength 5/5 bl Ue and LE. Normal sensation to light touch.   ED Results /  Procedures / Treatments   Labs (all labs ordered are listed, but only abnormal results are displayed) Labs Reviewed  BASIC METABOLIC PANEL - Abnormal; Notable for the following components:      Result Value   Chloride 97 (*)    Glucose, Bld 105 (*)    All other components within normal limits  CBC - Abnormal; Notable for the following components:   MCV 100.5 (*)    All other components within normal limits  HEPATIC FUNCTION PANEL - Abnormal; Notable for the following components:   Total Bilirubin 1.3 (*)    Indirect Bilirubin 1.1 (*)    All other components within normal limits  MAGNESIUM - Abnormal; Notable for the following components:   Magnesium 1.5 (*)    All other components within normal limits  LACTIC ACID, PLASMA  PHOSPHORUS  GLUCOSE, CAPILLARY  URINALYSIS, ROUTINE W REFLEX MICROSCOPIC  MAGNESIUM  BASIC METABOLIC PANEL  CBC  PHOSPHORUS  PROTIME-INR  TROPONIN I (HIGH SENSITIVITY)  TROPONIN I (HIGH SENSITIVITY)  EKG Atrial flutter with variable block, VR 83. QRS 112, QTc 479. No acute ST elevations or depressions.   RADIOLOGY    I also independently reviewed and agree with radiologist interpretations.   PROCEDURES:  Critical Care performed: No  .1-3 Lead EKG Interpretation  Performed by: Shaune Pollack, MD Authorized by: Shaune Pollack, MD     Interpretation: abnormal     ECG rate:  70-90   ECG rate assessment: normal     Rhythm: atrial flutter     Ectopy: none     Conduction: normal   Comments:     Indication: weakness  .Critical Care  Performed by: Shaune Pollack, MD Authorized by: Shaune Pollack, MD   Critical care provider statement:    Critical care time (minutes):  30   Critical care time was exclusive of:  Separately billable procedures and treating other patients   Critical care was necessary to treat or prevent imminent or life-threatening deterioration of the following conditions:  Cardiac failure, circulatory failure and  respiratory failure   Critical care was time spent personally by me on the following activities:  Development of treatment plan with patient or surrogate, discussions with consultants, evaluation of patient's response to treatment, examination of patient, ordering and review of laboratory studies, ordering and review of radiographic studies, ordering and performing treatments and interventions, pulse oximetry, re-evaluation of patient's condition and review of old charts     MEDICATIONS ORDERED IN ED: Medications  ondansetron (ZOFRAN) injection 4 mg (has no administration in time range)  0.9 %  sodium chloride infusion ( Intravenous New Bag/Given 02/14/23 2204)  clevidipine (CLEVIPREX) infusion 0.5 mg/mL (2 mg/hr Intravenous New Bag/Given 02/14/23 2354)   stroke: early stages of recovery book (has no administration in time range)  acetaminophen (TYLENOL) tablet 650 mg (has no administration in time range)    Or  acetaminophen (TYLENOL) 160 MG/5ML solution 650 mg (has no administration in time range)    Or  acetaminophen (TYLENOL) suppository 650 mg (has no administration in time range)  senna-docusate (Senokot-S) tablet 1 tablet (has no administration in time range)  pantoprazole (PROTONIX) injection 40 mg (has no administration in time range)  hydrALAZINE (APRESOLINE) injection 10-20 mg (has no administration in time range)  simvastatin (ZOCOR) tablet 20 mg (has no administration in time range)  lactated ringers bolus 1,000 mL (has no administration in time range)  magnesium sulfate IVPB 2 g 50 mL (0 g Intravenous Stopped 02/14/23 2125)  iohexol (OMNIPAQUE) 350 MG/ML injection 100 mL (100 mLs Intravenous Contrast Given 02/14/23 2214)  coag fact Xa recombinant (ANDEXXA) low dose infusion 900 mg (900 mg Intravenous New Bag/Given 02/15/23 0031)  iohexol (OMNIPAQUE) 350 MG/ML injection 75 mL (75 mLs Intravenous Contrast Given 02/14/23 2348)     IMPRESSION / MDM / ASSESSMENT AND PLAN / ED COURSE   I reviewed the triage vital signs and the nursing notes.                              Differential diagnosis includes, but is not limited to, arrhythmia - AFlutter with RVR, anemia, ACS, anemia, dehydration, occult infection, unlikely CVA  Patient's presentation is most consistent with acute presentation with potential threat to life or bodily function.  The patient is on the cardiac monitor to evaluate for evidence of arrhythmia and/or significant heart rate changes  82 yo M with PMHx AFlutter on Eliquis, HTN, HLD, here with generalized weakness/near syncope. Pt initially  denied any headache, weakness, numbness, or head trauma. Initial concern was for arrhythmia, aflutter RVR as pt was noted to be in flutter here with intermittent RVR. Labs otherwise overall reassuring. Mild hypomag noted which was repleted. Given unclear etiology of his episode, CT head and Angio was obtained, and CT Head noted to show pontine hemorrhage surprisingly. Pt states he is tired but o/w has no neuro deficits, and is answering questions appropriately.  Discussed CT with Dr. Marcell Barlow who recommended discussing with Neurology. Discussed with Dr. Wilford Corner of Neurology, who recommends reversal of anticoag, Cleviprex for SBP goal 130-150, and admission to [facility] ICU. Will obtain CT Angio for assessment of possible active bleeding. Pt updated.   FINAL CLINICAL IMPRESSION(S) / ED DIAGNOSES   Final diagnoses:  Pontine hemorrhage (HCC)  Near syncope  Atrial flutter, unspecified type (HCC)     Rx / DC Orders   ED Discharge Orders     None        Note:  This document was prepared using Dragon voice recognition software and may include unintentional dictation errors.   Shaune Pollack, MD 02/15/23 [phone]
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
## 2023-02-15 ENCOUNTER — Inpatient Hospital Stay: Payer: Medicare Other

## 2023-02-15 DIAGNOSIS — I613 Nontraumatic intracerebral hemorrhage in brain stem: Secondary | ICD-10-CM | POA: Diagnosis not present

## 2023-02-15 LAB — CBC
HCT: 43.8 % (ref 39.0–52.0)
Hemoglobin: 15 g/dL (ref 13.0–17.0)
MCH: 33.6 pg (ref 26.0–34.0)
MCHC: 34.2 g/dL (ref 30.0–36.0)
MCV: 98 fL (ref 80.0–100.0)
Platelets: 197 10*3/uL (ref 150–400)
RBC: 4.47 MIL/uL (ref 4.22–5.81)
RDW: 12.6 % (ref 11.5–15.5)
WBC: 9.1 10*3/uL (ref 4.0–10.5)
nRBC: 0 % (ref 0.0–0.2)

## 2023-02-15 LAB — BASIC METABOLIC PANEL
Anion gap: 11 (ref 5–15)
BUN: 17 mg/dL (ref 8–23)
CO2: 26 mmol/L (ref 22–32)
Calcium: 9.3 mg/dL (ref 8.9–10.3)
Chloride: 98 mmol/L (ref 98–111)
Creatinine, Ser: 0.86 mg/dL (ref 0.61–1.24)
GFR, Estimated: >60 mL/min (ref >60)
Glucose, Bld: 93 mg/dL (ref 70–99)
Potassium: 3.4 mmol/L — ABNORMAL LOW (ref 3.5–5.1)
Sodium: 135 mmol/L (ref 135–145)

## 2023-02-15 LAB — GLUCOSE, CAPILLARY
Glucose-Capillary: 71 mg/dL (ref 70–99)
Glucose-Capillary: 82 mg/dL (ref 70–99)
Glucose-Capillary: 86 mg/dL (ref 70–99)
Glucose-Capillary: 89 mg/dL (ref 70–99)
Glucose-Capillary: 94 mg/dL (ref 70–99)
Glucose-Capillary: 96 mg/dL (ref 70–99)
Glucose-Capillary: 99 mg/dL (ref 70–99)

## 2023-02-15 LAB — MRSA NEXT GEN BY PCR, NASAL: MRSA by PCR Next Gen: NOT DETECTED

## 2023-02-15 LAB — PHOSPHORUS: Phosphorus: 2.5 mg/dL (ref 2.5–4.6)

## 2023-02-15 LAB — MAGNESIUM: Magnesium: 2 mg/dL (ref 1.7–2.4)

## 2023-02-15 LAB — PROTIME-INR
INR: 1.1 (ref 0.8–1.2)
Prothrombin Time: 14.3 s (ref 11.4–15.2)

## 2023-02-15 MED ORDER — ORAL CARE MOUTH RINSE: 15.0000 mL | OROMUCOSAL | Status: DC | PRN

## 2023-02-15 MED ORDER — LACTATED RINGERS IV BOLUS
1000.0000 mL | Freq: Once | INTRAVENOUS | Status: AC
  Administered 2023-02-15: 1000 mL via INTRAVENOUS

## 2023-02-15 MED ORDER — ENSURE ENLIVE PO LIQD
237.0000 mL | Freq: Three times a day (TID) | ORAL | Status: DC
  Administered 2023-02-16 – 2023-02-23 (×18): 237 mL via ORAL

## 2023-02-15 MED ORDER — LORAZEPAM 2 MG/ML IJ SOLN
1.0000 mg | Freq: Once | INTRAMUSCULAR | Status: AC | PRN
  Administered 2023-02-15: 1 mg via INTRAVENOUS
  Filled 2023-02-15: qty 1

## 2023-02-15 MED ORDER — METOPROLOL TARTRATE 25 MG PO TABS
12.5000 mg | ORAL_TABLET | Freq: Every day | ORAL | Status: DC
  Administered 2023-02-16: 12.5 mg via ORAL
  Filled 2023-02-15: qty 1

## 2023-02-15 MED ORDER — ADULT MULTIVITAMIN W/MINERALS CH
1.0000 | ORAL_TABLET | Freq: Every day | ORAL | Status: DC
  Administered 2023-02-16 – 2023-02-23 (×8): 1 via ORAL
  Filled 2023-02-15 (×9): qty 1

## 2023-02-15 MED ORDER — CHLORHEXIDINE GLUCONATE CLOTH 2 % EX PADS
6.0000 | MEDICATED_PAD | Freq: Every day | CUTANEOUS | Status: DC
  Administered 2023-02-15 – 2023-02-16 (×2): 6 via TOPICAL

## 2023-02-15 MED ORDER — DEXTROSE 10 % IV SOLN: INTRAVENOUS | Status: DC

## 2023-02-15 MED ORDER — DEXTROSE 10 % IV SOLN
INTRAVENOUS | Status: DC
  Administered 2023-02-15: 75 mL/h via INTRAVENOUS
  Administered 2023-02-16: 25 mL/h via INTRAVENOUS

## 2023-02-15 NOTE — Progress Notes (Signed)
Pts son Garrell Flagg called the ICU to inquire why Mr. Tant is currently hospitalized.  I had a detailed conversation and explained why the pt required hospitalization; pts condition; and current plan of care.  All questions were answered.   Zada Girt, AGNP  Pulmonary/Critical Care Pager [phone] (please enter 7 digits) PCCM Consult Pager [phone] (please enter 7 digits)

## 2023-02-15 NOTE — Progress Notes (Signed)
PT Cancellation Note  Patient Details Name: SHAUL TRAUTMAN MRN: [id number] DOB: 05-May-1941   Cancelled Treatment:    Reason Eval/Treat Not Completed: Patient not medically ready. Patient has 24 hour bedrest orders which will expire tonight. Will hold eval at this time.    Hagop Mccollam 02/15/2023, 8:41 AM

## 2023-02-15 NOTE — Consult Note (Signed)
Neurology Consultation Reason for Consult: Intracranial hemorrhage Referring Physician: Karna Christmas, F  CC: Confusion  History is obtained from: Wife  HPI: Ryan Allison is a 82 y.o. male with a history of atrial fibrillation on anticoagulation with Eliquis who had it held for several days before presentation, but had restarted it that morning.  The day of admission, he was standing in the driveway, and had a sudden change "something was just not right."  He has a lot of difficulty describing exactly what symptoms he had.  When asking for specific symptoms, he states yes to many of them such as double vision, but when asking for specifics he denies subsequently.  He seems quite confused.    LKW: 10/2 tnk given?: no, ICH ICH Score: 2  Past Medical History:  Diagnosis Date   Amblyopia of right eye    Anxiety    Aortic atherosclerosis (HCC)    Arthritis    Atrial fibrillation and flutter (HCC) 05/2018   a.) CHA2DS2VASc = 4 (age x 2, HTN, vascular disease history); b.) rate/rhythm maintained on oral metoprolol tartrate; chronically anticoagulated with dose reduced apixaban (experienced hematuria on rivaroxaban)   B12 deficiency    Cervical spondylosis with myelopathy    Chronic kidney disease, stage 3 (HCC)    DDD (degenerative disc disease), lumbar    Diverticulosis    Esophageal dysphagia    External hemorrhoids    GERD (gastroesophageal reflux disease)    GI bleed    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   History of echocardiogram    a.) TTE 06/07/2018: EF 60-65%, no RWMAs, mild MR, PASP 33 mmHg; b.) TTE 09/19/2019: EF 50-55%, mild LVH, mod RVE, mild LA dil, mild-mod TR, mild MR, PASP 42.5; c.) TTE 09/14/2022: EF 60-65%, mod basal-septal LVH, mild MR/AR, mod TR, AoV sclerosis, Ao root 37 mm   History of GI diverticular bleed    a.) required prolonged ICU admission at [facility]   History of kidney stones    HLD (hyperlipidemia)    Hyperlipidemia    Hypertension    Kyphoscoliosis     Meniscus tear    On apixaban therapy    OSA (obstructive sleep apnea)    a.) does not require nocturnal PAP therapy   Osteopenia    Peripheral neuropathy    Prostate cancer (HCC) 2011   PTSD (post-traumatic stress disorder)    RBBB (right bundle branch block)    SBO (small bowel obstruction) (HCC)    Skin cancer of scalp      Family History  Problem Relation Age of Onset   Dementia Mother    Cancer Father    Cancer Brother      Social History:  reports that he quit smoking about 53 years ago. His smoking use included cigarettes. He has been exposed to tobacco smoke. He has never used smokeless tobacco. He reports that he does not drink alcohol and does not use drugs.   Exam: Current vital signs: BP 127/68   Pulse 73   Temp 97.8 F (36.6 C)   Resp 16   Ht 5\' 10"  (1.778 m)   Wt 66.1 kg   SpO2 100%   BMI 20.91 kg/m  Vital signs in last 24 hours: Temp:  [97.8 F (36.6 C)-98.3 F (36.8 C)] 97.8 F (36.6 C) (10/03 0800) Pulse Rate:  [69-93] 73 (10/03 0830) Resp:  [10-22] 16 (10/03 0830) BP: (105-171)/(55-107) 127/68 (10/03 0830) SpO2:  [84 %-100 %] 100 % (10/03 0830) Weight:  [66.1 kg-72.2  kg] 66.1 kg (10/03 0130)   Physical Exam  Appears well-developed and well-nourished.   Neuro: Mental Status: Patient is awake, alert, he is oriented to the fact that he is in the hosptial, but not which one.  No signs of aphasia or neglect Cranial Nerves: II: Visual Fields are full. Pupils are equal, round, and reactive to light.   III,IV, VI: EOMI, he has redundant tissue over his right eye, but with symmetric interpalpebral gap.  V: Facial sensation is symmetric to temperature VII: Facial movement is symmetric.  VIII: hearing is intact to voice Motor: Tone is normal. Bulk is normal.  He is able to keep all extremities aloft stress, but gives poor effort with confrontational testing of the leg, 5/5 in the arms Sensory: Sensation is symmetric to light touch in the arms and  legs. Cerebellar: FNF intact bilaterally    I have reviewed labs in epic and the results pertinent to this consultation are:   I have reviewed the images obtained: CT head pontine ICH, stable on repeat.   Impression: 82 year old male with small pontine hemorrhage.  It is possible that this is hypertensive, though with him being off of his anticoagulation for several days prior, it is also possible that he had a small ischemic infarct with hemorrhagic conversion upon resumption of his anticoagulation.  He will need close monitoring, especially given that he seems more confused, this could be ICU delirium superimposed on his underlying stroke or taking worsening of his bleed, which could be affecting the articulating activating system to some degree.  I would favor repeating imaging with MRI today.  Recommendations: 1) MRI brain 2) strict maintenance of blood pressure less than 150 3) avoid antithrombotics 4) neurology will continue to follow.   Ritta Slot, MD [facility] [phone]  If 7pm- 7am, please page neurology on call as listed in AMION.

## 2023-02-15 NOTE — Progress Notes (Signed)
   This is a no charge note  [name] is a 82 y.o. male with medical history significant of HTN,  HLD, A fib/flutter on Eliquis, diverticular bleeding, small bowel obstruction, prostate cancer (s/p of radiation therapy), anxiety, OSA not using CPAP, recent hammertoe correction, who presents with near syncope.  CTA negative for PE. CT-head showed  small acute pontine hemorrhage measuring approximately 8 x 5 mm. EDP consulted Dr. Wilford Corner of neuro and Dr. Karna Christmas of ICU. Pt is admitted to ICU by Dr. Karna Christmas. I cancelled admission.         Lorretta Harp, MD  [facility]   If 7PM-7AM, please contact night-coverage [email or web address] 02/15/2023, 1:41 AM

## 2023-02-15 NOTE — Telephone Encounter (Signed)
Called patient and left a detailed message per DPR. Message left of the following from Dr. Mariah Milling.  from the description he provides, does not seem like a normal side effect from metoprolol I am more concerned about other causes Appears he is in the emergency room, agree with getting a workup Thx TGollan   Patient asked to call back if he has any questions.

## 2023-02-15 NOTE — Progress Notes (Signed)
OT Cancellation Note  Patient Details Name: Ryan Allison MRN: [id number] DOB: Aug 16, 1940   Cancelled Treatment:    Reason Eval/Treat Not Completed: Active bedrest order. Order received, chart reviewed. Pt noted to have 24 hour bed rest orders ending 02/15/23 at 2340. Will hold and initiate therapy evaluation as pt appropriate.   Kathie Dike, M.S. OTR/L  02/15/23, 8:35 AM  ascom [phone]

## 2023-02-15 NOTE — Progress Notes (Addendum)
0700 NIH worse this am.  Although he is tired he was unable to complete most NIH sections without extreme coaxing. He hangs his head down all the time. Not very interactive 0830 Dr. Amada Jupiter in to see patient. Reported that he appeared to be more confused. Unable to participate in NIH assessment. Unable to state anything but his full name. MAE but not to command. Bilateral strength equal. 1015 Ileostomy bag emptied per patient request. Son called from [place] to check on patient. Talked to D.Delton See NP for update on his dad. 1030 Found up in room.  1120 Down to MRI for head scan. 1130 Sedated for MRI. 1200 Back to room. Sleeping from Ativan. Unable to do NIH stroke scale due to sedation. 1230-1430 Constantly attempting OOB. Will not follow directions or follow commands. Unable to communicate more than "I want" and then unable to finish any sentences. MRI has not been read by a radiologist yet. 1500 Still refuses to cooperate so nurse can assess neurological status. MAE without issues. Can make short sentences at times such as "I want to get up!" Refuses to read the NIH words or follow commands. Yells "Why" when ask to grip,smile etc. Glares at nurse when she enters  the room. Dr. Amada Jupiter and D.Nelson NP both notified of patient's behavior and nurses inability  to assess mental status as ordered 1600 Patient states he is afraid. Talked about the things the hospital is doing to help him. Who his doctors are and what they said. Acknowledged that it is normal to be scared and we were trying to help him get better. Patient seemed more settled after talking with nurse. 1630 CBG 71. IVF changed to D10 at 75 ml/hr. 1700 Patient refused to eat dinner. Drank a few sips of water. Sleeping at intervals.

## 2023-02-15 NOTE — Progress Notes (Signed)
eLink Physician-Brief Progress Note Patient Name: Ryan Allison DOB: [birth year] MRN: [id number]   Date of Service  02/15/2023  HPI/Events of Note  82 year old with a history of essential hypertension, dyslipidemia and atrial fibs/flutter on Eliquis and previous diverticular bleeding who initially presented to the emergency department with near syncope.  CT head showed small acute pontine hemorrhage measuring roughly 8 x 5 mm.  Referred to the ICU for neuromonitoring.  Patient noted to be hypertensive into the 170s but otherwise normal vitals.  Saturating 99% on room air.  Electrolyte disturbances including hypomagnesemia, CBC.  CT PE without evidence of any acute findings.  CT head with a pontine ICH.   eICU Interventions  ICH 2, 26% mortality  Goal SBP 130-150 with hydralazine and Cleviprex as needed, head of bed elevation, frequent neurochecks  Eliquis held, Andexxa ordered, last dose 10/2 AM  Interval CT scan in 6 hours MRI brain per neurology  DVT prophylaxis held, SCDs GI prophylaxis with pantoprazole     Intervention Category Evaluation Type: New Patient Evaluation  Everlene Cunning 02/15/2023, 1:38 AM

## 2023-02-15 NOTE — Progress Notes (Signed)
Updated pts son Mikle Sternberg regarding MRI Brain results along with pts current status.  All questions were answered.  Zada Girt, AGNP  Pulmonary/Critical Care Pager [phone] (please enter 7 digits) PCCM Consult Pager [phone] (please enter 7 digits)

## 2023-02-16 ENCOUNTER — Encounter: Payer: Self-pay | Admitting: Pulmonary Disease

## 2023-02-16 DIAGNOSIS — I613 Nontraumatic intracerebral hemorrhage in brain stem: Principal | ICD-10-CM | POA: Diagnosis present

## 2023-02-16 DIAGNOSIS — I4892 Unspecified atrial flutter: Secondary | ICD-10-CM

## 2023-02-16 DIAGNOSIS — R55 Syncope and collapse: Secondary | ICD-10-CM | POA: Diagnosis not present

## 2023-02-16 LAB — CBC
HCT: 42.7 % (ref 39.0–52.0)
Hemoglobin: 15.7 g/dL (ref 13.0–17.0)
MCH: 33.9 pg (ref 26.0–34.0)
MCHC: 36.8 g/dL — ABNORMAL HIGH (ref 30.0–36.0)
MCV: 92.2 fL (ref 80.0–100.0)
Platelets: 163 10*3/uL (ref 150–400)
RBC: 4.63 MIL/uL (ref 4.22–5.81)
RDW: 12.3 % (ref 11.5–15.5)
WBC: 7.7 10*3/uL (ref 4.0–10.5)
nRBC: 0 % (ref 0.0–0.2)

## 2023-02-16 LAB — GLUCOSE, CAPILLARY
Glucose-Capillary: 101 mg/dL — ABNORMAL HIGH (ref 70–99)
Glucose-Capillary: 106 mg/dL — ABNORMAL HIGH (ref 70–99)
Glucose-Capillary: 109 mg/dL — ABNORMAL HIGH (ref 70–99)
Glucose-Capillary: 114 mg/dL — ABNORMAL HIGH (ref 70–99)
Glucose-Capillary: 116 mg/dL — ABNORMAL HIGH (ref 70–99)
Glucose-Capillary: 117 mg/dL — ABNORMAL HIGH (ref 70–99)

## 2023-02-16 LAB — COMPREHENSIVE METABOLIC PANEL
ALT: 12 U/L (ref 0–44)
AST: 20 U/L (ref 15–41)
Albumin: 3.7 g/dL (ref 3.5–5.0)
Alkaline Phosphatase: 78 U/L (ref 38–126)
Anion gap: 10 (ref 5–15)
BUN: 18 mg/dL (ref 8–23)
CO2: 26 mmol/L (ref 22–32)
Calcium: 9.1 mg/dL (ref 8.9–10.3)
Chloride: 99 mmol/L (ref 98–111)
Creatinine, Ser: 0.87 mg/dL (ref 0.61–1.24)
GFR, Estimated: >60 mL/min (ref >60)
Glucose, Bld: 108 mg/dL — ABNORMAL HIGH (ref 70–99)
Potassium: 3.2 mmol/L — ABNORMAL LOW (ref 3.5–5.1)
Sodium: 135 mmol/L (ref 135–145)
Total Bilirubin: 2.1 mg/dL — ABNORMAL HIGH (ref 0.3–1.2)
Total Protein: 7.3 g/dL (ref 6.5–8.1)

## 2023-02-16 LAB — BASIC METABOLIC PANEL
Anion gap: 14 (ref 5–15)
BUN: 22 mg/dL (ref 8–23)
CO2: 23 mmol/L (ref 22–32)
Calcium: 9.1 mg/dL (ref 8.9–10.3)
Chloride: 97 mmol/L — ABNORMAL LOW (ref 98–111)
Creatinine, Ser: 1.09 mg/dL (ref 0.61–1.24)
GFR, Estimated: >60 mL/min (ref >60)
Glucose, Bld: 118 mg/dL — ABNORMAL HIGH (ref 70–99)
Potassium: 3.8 mmol/L (ref 3.5–5.1)
Sodium: 134 mmol/L — ABNORMAL LOW (ref 135–145)

## 2023-02-16 LAB — PROTIME-INR
INR: 1.2 (ref 0.8–1.2)
Prothrombin Time: 15.7 s — ABNORMAL HIGH (ref 11.4–15.2)

## 2023-02-16 LAB — PHOSPHORUS: Phosphorus: 2.6 mg/dL (ref 2.5–4.6)

## 2023-02-16 LAB — TRIGLYCERIDES: Triglycerides: 63 mg/dL (ref <150)

## 2023-02-16 LAB — MAGNESIUM: Magnesium: 1.8 mg/dL (ref 1.7–2.4)

## 2023-02-16 MED ORDER — LACTATED RINGERS IV SOLN
INTRAVENOUS | Status: DC
  Administered 2023-02-16: 75 mL/h via INTRAVENOUS

## 2023-02-16 MED ORDER — POTASSIUM CHLORIDE CRYS ER 20 MEQ PO TBCR
40.0000 meq | EXTENDED_RELEASE_TABLET | Freq: Two times a day (BID) | ORAL | Status: AC
  Administered 2023-02-16 (×2): 40 meq via ORAL
  Filled 2023-02-16 (×2): qty 2

## 2023-02-16 MED ORDER — POTASSIUM CHLORIDE CRYS ER 20 MEQ PO TBCR: 40.0000 meq | EXTENDED_RELEASE_TABLET | Freq: Two times a day (BID) | ORAL | Status: DC

## 2023-02-16 MED ORDER — SENNOSIDES-DOCUSATE SODIUM 8.6-50 MG PO TABS: 1.0000 | ORAL_TABLET | Freq: Two times a day (BID) | ORAL | Status: DC | PRN

## 2023-02-16 MED ORDER — METOPROLOL TARTRATE 25 MG PO TABS
12.5000 mg | ORAL_TABLET | Freq: Two times a day (BID) | ORAL | Status: DC
  Administered 2023-02-16 – 2023-02-23 (×14): 12.5 mg via ORAL
  Filled 2023-02-16 (×15): qty 1

## 2023-02-16 NOTE — Progress Notes (Addendum)
0800 Patient up with walker against his will. He finally cooperated and got in chair. Patient states he does not want to move around. Encouraged to discuss his feelings about his head bleed. Patient answers with yes/ no. He still has some expressive aphasia. Uses walker well. Follows directions well. 1000 Took his medications in applesauce. Swallows well. Fed self breakfast- ate very little. 1200 Friend in to bring more boxer shorts. Friend very attentive to patient. Back to bed with help. Friend stated patient stated "I wanted to die." Friend very upset about statement. Fed lunch by friend. 1530 Working with PT. 1630 Up in room walking. Placed in chair by PT. 1700 Ryan Allison and son Ryan Allison in to visit from [place]. Son and xwife tearful. Patient tearful as well. 1810 Report called to [facility] on 1C. 1840 Transferred to room 116 via bed.

## 2023-02-16 NOTE — Progress Notes (Signed)
NAME:  Ryan Allison, MRN:  [id number], DOB:  [birth year], LOS: 2 ADMISSION DATE:  02/14/2023, CHIEF COMPLAINT:  near syncope   History of Present Illness:   82 yo M presenting to [facility] ED from home for evaluation of near syncope.   History obtained per chart review and bedside patient report. Patient reports being in his normal state of health until early in the morning on 10/2. He was at home in bed when he began to feel extremely weak with some tremors in his Right leg and sat down. He then describes breaking out in a sweat and too weak to get off the ground. This lasted for 30 minutes-1 hour. He ultimately was able to get himself back up and into bed and go back to sleep. He had another recurrence of symptoms when he got out of bed for the day that included nausea without vomiting. He does endorse feeling tired and lightheaded as well as a headache. He denied pain, blurred vision, shortness of breath, chest pain, no overt palpitations.  Of note patient on a reduced dose of Eliquis at 2.5 mg BID due to history of diverticular bleeding and hematuria. Patient has declined Watchman device/ cardioversion previously.   ED course: Upon arrival patient alert and oriented with stable vitals in Atrial flutter. Labs reassuring with the exception of hypomagnesemia. Imaging revealed small pontine ICH. NSUR alerted, patient not a candidate for surgical intervention at this time, recommending Neurology consultation. Neurology recommended admission to ICU with strict BP control 130-150 and follow up imaging to monitor ICH overnight. Eliquis reversed.  Medications given: Andexxa, Cleviprex drip started, Mg 2g, IV contrast Initial Vitals: 98.2/ 71/ 155/78 & 96 % on RA   CT angio chest PE 02/14/23: No evidence of pulmonary emboli. 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 8 mm left upper pole renal stone CT head wo contrast 02/14/23: Small acute pontine hemorrhage measuring approximately 8 x 5  mm. CT angio head/neck w/wo contrast 02/14/23: No emergent large vessel occlusion or hemodynamically significant stenosis of the head or neck. Mild bilateral carotid bifurcation atherosclerosis without hemodynamically significant stenosis. Unchanged size of pontine intraparenchymal hematoma.   PCCM consulted for admission due to Small pontine ICH on cleviprex drip for HTN.  Pertinent  Medical History   Atrial flutter on Eliquis Diverticular bleed Prostate cancer s/p RT OSA (not on CPAP) Anxiety HTN HLD Prior tobacco use  Significant Hospital Events: Including procedures, antibiotic start and stop dates in addition to other pertinent events   02/15/23: Admit to ICU with small pontine ICH secondary to HTN and Eliquis administration on Cleviprex drip. Neurology following 02/16/23: imaging unchanged, off cleviprex, blood pressure well controlled  Interim History / Subjective:  Feels weak this morning, no focal deficit, was eating breakfast.   Objective   Blood pressure (!) 117/50, pulse 89, temperature 98.4 F (36.9 C), temperature source Axillary, resp. rate (!) 25, height 5\' 10"  (1.778 m), weight 66.1 kg, SpO2 100%.        Intake/Output Summary (Last 24 hours) at 02/16/2023 0816 Last data filed at 02/16/2023 0105 Gross per 24 hour  Intake 1042.46 ml  Output 1350 ml  Net -307.54 ml   Filed Weights   02/14/23 1605 02/15/23 0130  Weight: 72.2 kg 66.1 kg    Examination: Physical Exam Constitutional:      General: He is not in acute distress.    Appearance: He is ill-appearing.  HENT:     Head: Normocephalic.     Mouth/Throat:  Mouth: Mucous membranes are dry.  Cardiovascular:     Pulses: Normal pulses.     Heart sounds: Normal heart sounds.  Pulmonary:     Effort: Pulmonary effort is normal.     Breath sounds: Normal breath sounds.  Neurological:     General: No focal deficit present.     Mental Status: He is alert.     GCS: GCS eye subscore is 4. GCS verbal  subscore is 5. GCS motor subscore is 6.     Motor: Weakness present.      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 He is admitted for close neurological monitoring and has done well with stable imaging and normal blood pressure. Appreciate input from cardiology.  -neurochecks q1hours > wean to q 4hours -BP controlled with Clevidipine, goal SBP < 150 mmHg -MRI brain ordered -hold anti-coagulation  Cardiovascular #Atrial Flutter #HTN #HFpEF  Managed medically at home with eliquis for anticoagulation and metoprolol. He's been rate controlled while admitted with [name]. No signs of acute heart failure noted.  -hold apixaban -continue metoprolol, initiate at 12.5 mg bid  Pulmonary #OSA  No active pulmonary issues at this point. Will continue to monitor  Gastrointestinal  Resume diet. No role for SUP.  Renal #Hypokalemia  Noted to be hypokalemic on presentation - replenished. Will monitor.  Endocrine  ICU glycemic protocol in place  Hem/Onc #ICH  Hold Apixaban.  ID No active issue, no role for antibiotics   Best Practice (right click and "Reselect all SmartList Selections" daily)   Diet/type: Regular consistency (see orders) DVT prophylaxis: SCD GI prophylaxis: N/A Lines: N/A Foley:  N/A Code Status:  full code Last date of multidisciplinary goals of care discussion [02/16/2023]  Labs   CBC: Recent Labs  Lab 02/14/23 1628 02/15/23 0522 02/16/23 0448  WBC 6.5 9.1 7.7  HGB 14.8 15.0 15.7  HCT 44.3 43.8 42.7  MCV 100.5* 98.0 92.2  PLT 188 197 163    Basic Metabolic Panel: Recent Labs  Lab 02/14/23 1628 02/14/23 1740 02/14/23 1806 02/15/23 0522 02/16/23 0448  NA 137  --   --  135 135  K 3.9  --   --  3.4* 3.2*  CL 97*  --   --  98 99  CO2 27  --   --  26 26   GLUCOSE 105*  --   --  93 108*  BUN 21  --   --  17 18  CREATININE 1.07  --   --  0.86 0.87  CALCIUM 9.5  --   --  9.3 9.1  MG  --  1.5*  --  2.0 1.8  PHOS  --   --  2.7 2.5 2.6   GFR: Estimated Creatinine Clearance: 63.3 mL/min (by C-G formula based on SCr of 0.87 mg/dL). Recent Labs  Lab 02/14/23 1628 02/14/23 1740 02/15/23 0522 02/16/23 0448  WBC 6.5  --  9.1 7.7  LATICACIDVEN  --  1.1  --   --     Liver Function Tests: Recent Labs  Lab 02/14/23 1740 02/16/23 0448  AST 21 20  ALT 14 12  ALKPHOS 80 78  BILITOT 1.3* 2.1*  PROT 7.4 7.3  ALBUMIN 3.7 3.7   No results for input(s): "LIPASE", "AMYLASE" in the last 168 hours. No results for input(s): "  AMMONIA" in the last 168 hours.  ABG No results found for: "PHART", "PCO2ART", "PO2ART", "HCO3", "TCO2", "ACIDBASEDEF", "O2SAT"   Coagulation Profile: Recent Labs  Lab 02/15/23 0522 02/16/23 0448  INR 1.1 1.2    Cardiac Enzymes: No results for input(s): "CKTOTAL", "CKMB", "CKMBINDEX", "TROPONINI" in the last 168 hours.  HbA1C: Hgb A1c MFr Bld  Date/Time Value Ref Range Status  06/06/2018 03:08 PM 5.5 4.8 - 5.6 % Final    Comment:    (NOTE) Pre diabetes:          5.7%-6.4% Diabetes:              >6.4% Glycemic control for   <7.0% adults with diabetes     CBG: Recent Labs  Lab 02/15/23 1640 02/15/23 1933 02/15/23 2337 02/16/23 0354 02/16/23 0809  GLUCAP 71 96 99 106* 101*     Past Medical History:  He,  has a past medical history of Amblyopia of right eye, Anxiety, Aortic atherosclerosis (HCC), Arthritis, Atrial fibrillation and flutter (HCC) (05/2018), B12 deficiency, Cervical spondylosis with myelopathy, Chronic kidney disease, stage 3 (HCC), DDD (degenerative disc disease), lumbar, Diverticulosis, Esophageal dysphagia, External hemorrhoids, GERD (gastroesophageal reflux disease), GI bleed, Hammertoe of left foot (12/2022), History of echocardiogram, History of GI diverticular bleed, History of  kidney stones, HLD (hyperlipidemia), Hyperlipidemia, Hypertension, Kyphoscoliosis, Meniscus tear, On apixaban therapy, OSA (obstructive sleep apnea), Osteopenia, Peripheral neuropathy, Prostate cancer (HCC) (2011), PTSD (post-traumatic stress disorder), RBBB (right bundle branch block), SBO (small bowel obstruction) (HCC), and Skin cancer of scalp.   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   COLONOSCOPY Left 06/07/2018   Procedure: COLONOSCOPY;  Surgeon: Pasty Spillers, MD;  Location: [facility] ENDOSCOPY;  Service: Endoscopy;  Laterality: Left;   COLONOSCOPY WITH ESOPHAGOGASTRODUODENOSCOPY (EGD)  09/12/2010   COLONOSCOPY WITH PROPOFOL N/A 04/06/2015   Procedure: COLONOSCOPY WITH PROPOFOL;  Surgeon: Christena Deem, MD;  Location: [facility] ENDOSCOPY;  Service: Endoscopy;  Laterality: N/A;   COLONOSCOPY WITH PROPOFOL N/A 06/07/2018   Procedure: COLONOSCOPY WITH PROPOFOL;  Surgeon: Pasty Spillers, MD;  Location: [facility] ENDOSCOPY;  Service: Endoscopy;  Laterality: N/A;   HAMMER TOE SURGERY Left 01/05/2023   Procedure: HAMMER TOE CORRECTION; INJECTION OF FAT PAD GRAFT;  Surgeon: [name], DPM;  Location: [facility] ORS;  Service: Podiatry;  Laterality: Left;   INGUINAL HERNIA REPAIR Left 09/23/2014   Procedure: HERNIA REPAIR INGUINAL ADULT;  Surgeon: Kieth Brightly, MD;  Location: [facility] ORS;  Service: General;  Laterality: Left;   LASIK  2000   MENISCUS REPAIR Left 05/15/2006   MOHS SURGERY     right scalp   SKIN GRAFT FULL THICKNESS ARM Right 1966   [place]   STRABISMUS SURGERY Right 07/17/2016   VASCULAR SURGERY  05/15/2013   s/p GI Bleed     Social History:   reports that he quit smoking about 53 years ago. His smoking use included cigarettes. He has been exposed to tobacco smoke. He has never used smokeless tobacco. He reports that he does not drink alcohol and does not use drugs.   Family  History:  His family history includes Cancer in his brother and father; Dementia in his mother.   Allergies Allergies  Allergen Reactions   Nsaids Other (See Comments)    Diverticular bleed   Tolmetin     Diverticular bleed   Celecoxib Itching and Rash   Diltiazem Hcl Rash  Venlafaxine Other (See Comments)    sedation     Home Medications  Prior to Admission medications   Medication Sig Start Date End Date Taking? Authorizing Provider  acetaminophen (TYLENOL) 500 MG tablet Take 500 mg by mouth daily as needed for fever or pain.    Yes [provider]  apixaban (ELIQUIS) 2.5 MG TABS tablet Take 1 tablet (2.5 mg total) by mouth 2 (two) times daily. 02/05/23  Yes Antonieta Iba, MD  cyanocobalamin 1000 MCG tablet Take 1,000 mcg by mouth daily.   Yes [provider]  gabapentin (NEURONTIN) 600 MG tablet Take 600 mg by mouth 4 (four) times daily.   Yes [provider]  loperamide (IMODIUM) 2 MG capsule Take 2 mg by mouth daily as needed for diarrhea or loose stools.   Yes [provider]  metoprolol succinate (TOPROL-XL) 25 MG 24 hr tablet Take 1 tablet (25 mg total) by mouth daily. Take with or immediately following a meal. 02/05/23 05/06/23 Yes Gollan, Tollie Pizza, MD  metoprolol tartrate (LOPRESSOR) 25 MG tablet Take 1 tablet (25 mg total) by mouth daily as needed (take 12.5 mg up to 25 mg as needed for resting heart rate greater then 90.). 02/05/23  Yes Gollan, Tollie Pizza, MD  simvastatin (ZOCOR) 20 MG tablet Take 20 mg by mouth at bedtime.    Yes [provider]  traMADol (ULTRAM) 50 MG tablet Take 50 mg by mouth every 6 (six) hours as needed for moderate pain.    Yes [provider]     Critical care time: 31 minutes    Raechel Chute, MD [facility] 02/16/2023 3:26 PM

## 2023-02-16 NOTE — Progress Notes (Signed)
Subjective: No significant change  Exam: Vitals:   02/16/23 0900 02/16/23 1000  BP: (!) 106/56   Pulse: 73   Resp: 20 (!) 26  Temp:    SpO2: 100%    Gen: In bed, NAD Resp: non-labored breathing, no acute distress Abd: soft, nt  Neuro: MS: awake, interactive and appropriate, oriented to person, that he is in a hosptial but not which one, and not month, year ZO:XWRU, VFF, ? Very mild left facial weakness Motor: does not cooperate well, but is able to give good symmetric resistance in BUE Sensory:intact to LT  Pertinent Labs: Cr 0.87  Impression: 82 year old male with small pontine hemorrhage.  It is possible that this is hypertensive, though with him being off of his anticoagulation for several days prior, it is also possible that he had a small ischemic infarct with hemorrhagic conversion upon resumption of his anticoagulation.   Given the location and size, I would have expected more symptoms, so the possibility of a cavernoma that bled into the cavernoma is also distinctly possible.  I also think that he has some degree of delirium.  Recommendations: 1) continue holding anticoagulation or antithrombotics 2) strict maintenance of blood pressure less than 150 3) PT, OT, ST 4) neurology will follow  Ritta Slot, MD [facility] [phone]  If 7pm- 7am, please page neurology on call as listed in AMION.

## 2023-02-16 NOTE — Evaluation (Addendum)
Occupational Therapy Evaluation Patient Details Name: Ryan Allison MRN: [id number] DOB: 08-01-1940 Today's Date: 02/16/2023   History of Present Illness Ryan Allison is a 82 y.o. 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 Neurology workup concerning for hospital delirium and the possibility of a cavernoma that bled into the cavernoma   Clinical Impression   Ryan Allison was seen for OT evaluation this date. Prior to hospital admission, pt was IND. Pt lives alone. Pt orietned to self and location as hospital; difficulty answering home setup questions. Pt leects to keep eyes closed and head down t/o session, looks up with cues. Pt currently requires MAX A for ileostomy care in sitting. CGA + HHA for ADL t/f ~25 ft, MOD cues to navigate environment. Pt would benefit from skilled OT to address noted impairments and functional limitations (see below for any additional details). Upon hospital discharge, recommend OT follow up >3 hours/day.    If plan is discharge home, recommend the following: Supervision due to cognitive status;Direct supervision/assist for medications management;Direct supervision/assist for financial management;A little help with bathing/dressing/bathroom;A little help with walking and/or transfers    Functional Status Assessment  Patient has had a recent decline in their functional status and demonstrates the ability to make significant improvements in function in a reasonable and predictable amount of time.  Equipment Recommendations  None recommended by OT    Recommendations for Other Services       Precautions / Restrictions Precautions Precautions:  Fall Restrictions Weight Bearing Restrictions: No      Mobility Bed Mobility               General bed mobility comments: not tested    Transfers Overall transfer level: Needs assistance   Transfers: Sit to/from Stand Sit to Stand: Contact guard assist                  Balance Overall balance assessment: Needs assistance Sitting-balance support: No upper extremity supported, Feet supported Sitting balance-Leahy Scale: Good     Standing balance support: Single extremity supported, During functional activity Standing balance-Leahy Scale: Fair                             ADL either performed or assessed with clinical judgement   ADL Overall ADL's : Needs assistance/impaired                                       General ADL Comments: MAX A for ileostomy care in sitting. CGA + HHA for simulated toilet t/f      Pertinent Vitals/Pain Pain Assessment Pain Assessment: No/denies pain     Extremity/Trunk Assessment Upper Extremity Assessment Upper Extremity Assessment: Overall WFL for tasks assessed   Lower Extremity Assessment Lower Extremity Assessment: Generalized weakness       Communication Communication Communication: Hearing impairment;Difficulty following commands/understanding Following commands: Follows one step commands with increased time Cueing Techniques: Verbal cues   Cognition Arousal: Alert Behavior During Therapy: Flat affect, WFL for tasks assessed/performed Overall Cognitive Status: Impaired/Different from baseline Area of Impairment: Orientation, Following commands, Problem solving  Orientation Level: Disoriented to, Time, Situation     Following Commands: Follows one step commands with increased time, Follows one step commands inconsistently     Problem Solving: Slow processing General Comments: states location as hospital, unable to state time      Home Living  Family/patient expects to be discharged to:: Private residence Living Arrangements: Alone Available Help at Discharge: Neighbor Type of Home: House Home Access: Stairs to enter Secretary/administrator of Steps: 3 Entrance Stairs-Rails: None                 Home Equipment: Pharmacist, hospital (2 wheels);Grab bars - tub/shower   Additional Comments: home setup per chart      Prior Functioning/Environment Prior Level of Function : Independent/Modified Independent               ADLs Comments: IND for ileostomy care        OT Problem List: Decreased activity tolerance;Impaired balance (sitting and/or standing);Decreased strength;Decreased safety awareness      OT Treatment/Interventions: Therapeutic exercise;Self-care/ADL training;Energy conservation;DME and/or AE instruction;Therapeutic activities;Balance training;Patient/family education    OT Goals(Current goals can be found in the care plan section) Acute Rehab OT Goals Patient Stated Goal: to go home OT Goal Formulation: With patient Time For Goal Achievement: 03/02/23 Potential to Achieve Goals: Good ADL Goals Pt Will Perform Grooming: Independently;standing Pt Will Perform Lower Body Dressing: Independently;sit to/from stand Pt Will Transfer to Toilet: Independently;ambulating;regular height toilet  OT Frequency: Min 1X/week    Co-evaluation              AM-PAC OT "6 Clicks" Daily Activity     Outcome Measure Help from another person eating meals?: None Help from another person taking care of personal grooming?: A Little Help from another person toileting, which includes using toliet, bedpan, or urinal?: A Little Help from another person bathing (including washing, rinsing, drying)?: A Lot Help from another person to put on and taking off regular upper body clothing?: A Little Help from another person to put on and taking off regular lower body clothing?: A Little 6 Click Score: 18   End of  Session    Activity Tolerance: Patient tolerated treatment well Patient left: in chair;with call bell/phone within reach  OT Visit Diagnosis: Unsteadiness on feet (R26.81)                Time: 1610-9604 OT Time Calculation (min): 20 min Charges:  OT General Charges $OT Visit: 1 Visit OT Evaluation $OT Eval Moderate Complexity: 1 Mod OT Treatments $Self Care/Home Management : 8-22 mins  Kathie Dike, M.S. OTR/L  02/16/23, 1:17 PM  ascom [phone]

## 2023-02-16 NOTE — Evaluation (Signed)
Physical Therapy Evaluation Patient Details Name: Ryan Allison MRN: [id number] DOB: [birth year] Today's Date: 02/16/2023  History of Present Illness  [name] is a 82 y.o. 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  Clinical Impression  Pt sleeping upon entry with neighbor in room. Pt difficult to wake and overall lethargic throughout session although he was able to follow one step commands given increased time. Pt is Mod A +2 to sit up EOB. Once sitting up pt is able to balance himself EOB with BUE. He is CGA for transfers with RW, light cues for hand placement. Pt able to walk ~ 6 feet with RW and CGA to the reliner, pt overall very kyphotic in posture, able to take steady but very slow steps forwards and backwards. Pt sensation grossly equal and WFL, coordination equal but slow with finger opposition, bilateral upper and lower extremity MMT grossly WFL: Hip flexion, knee ext/flex, ankle dorsiflex all 4+/5 bilaterally. Elbow flex/ext 4+/5, grip strength 5/5. Pt education provided on neck positioning to address pt's tendency for extreme neck flexion. Pt will benefit from continued PT services upon discharge to safely address deficits listed in patient problem list for decreased caregiver assistance and eventual return to PLOF.       If plan is discharge home, recommend the following: Help with stairs or ramp for entrance;Assist for transportation;A little help with walking and/or transfers;A little help with bathing/dressing/bathroom;Supervision due to cognitive status;Direct supervision/assist for financial management;Direct supervision/assist for medications management;Assistance with  cooking/housework   Can travel by private vehicle        Equipment Recommendations Other (comment) (TBD at next venue of care)  Recommendations for Other Services       Functional Status Assessment Patient has had a recent decline in their functional status and demonstrates the ability to make significant improvements in function in a reasonable and predictable amount of time.     Precautions / Restrictions Precautions Precautions: Fall Restrictions Weight Bearing Restrictions: No      Mobility  Bed Mobility Overal bed mobility: Needs Assistance Bed Mobility: Supine to Sit     Supine to sit: +2 for physical assistance, Mod assist          Transfers Overall transfer level: Needs assistance Equipment used: Rolling walker (2 wheels) Transfers: Sit to/from Stand Sit to Stand: Contact guard assist           General transfer comment: pt able to stand after moments of hesitation, no physical assist required    Ambulation/Gait Ambulation/Gait assistance: Contact guard assist Gait Distance (Feet): 6 Feet Assistive device: Rolling walker (2 wheels) Gait Pattern/deviations: Step-through pattern Gait velocity: decresed     General Gait Details: Pt walking with notable kyphotic posture, steady steps but very slow. able to take steps forwrds and backwards, cues provided for RW management  Stairs            Wheelchair Mobility     Tilt Bed    Modified Rankin (Stroke Patients Only)       Balance Overall balance assessment: Needs assistance Sitting-balance support: Feet supported, Bilateral upper extremity supported Sitting balance-Leahy Scale: Good     Standing balance support: During functional activity, Bilateral upper extremity supported Standing balance-Leahy Scale: Fair  Standing balance comment: static standing with RW                             Pertinent Vitals/Pain Pain Assessment Pain Assessment: Faces Pain Score: 0-No pain     Home Living Family/patient expects to be discharged to:: Private residence Living Arrangements: Alone Available Help at Discharge: Neighbor Type of Home: House Home Access: Stairs to enter Entrance Stairs-Rails: Right (vertical grab bar on R side of garage steps) Entrance Stairs-Number of Steps: 1 (1 step in garage, 1 step in front of house)   Home Layout: One level Home Equipment: Agricultural consultant (2 wheels);Cane - single point Additional Comments: Pt home set up provided via neighbor who was present during evauation    Prior Function Prior Level of Function : Independent/Modified Independent             Mobility Comments: pts neighbor reports the pt is able to go on walks, cleans his own house, trims shrubs, typically does not use an assistive device ADLs Comments: IND for ileostomy care     Extremity/Trunk Assessment   Upper Extremity Assessment Upper Extremity Assessment: Overall WFL for tasks assessed    Lower Extremity Assessment Lower Extremity Assessment: Difficult to assess due to impaired cognition (Pt LE's overall strong during MMT)    Cervical / Trunk Assessment Cervical / Trunk Assessment: Kyphotic  Communication   Communication Communication: Difficulty following commands/understanding Following commands: Follows one step commands with increased time Cueing Techniques: Verbal cues;Gestural cues;Visual cues  Cognition Arousal: Alert Behavior During Therapy: Flat affect, WFL for tasks assessed/performed Overall Cognitive Status: Impaired/Different from baseline                                          General Comments      Exercises     Assessment/Plan    PT Assessment Patient needs continued PT services  PT Problem List Decreased strength;Decreased coordination;Decreased cognition;Decreased range of motion;Decreased activity tolerance;Decreased balance;Decreased mobility       PT Treatment Interventions DME  instruction;Neuromuscular re-education;Gait training;Balance training;Stair training;Functional mobility training;Therapeutic activities;Therapeutic exercise;Patient/family education    PT Goals (Current goals can be found in the Care Plan section)  Acute Rehab PT Goals PT Goal Formulation: Patient unable to participate in goal setting    Frequency Min 1X/week     Co-evaluation               AM-PAC PT "6 Clicks" Mobility  Outcome Measure Help needed turning from your back to your side while in a flat bed without using bedrails?: A Little Help needed moving from lying on your back to sitting on the side of a flat bed without using bedrails?: A Lot Help needed moving to and from a bed to a chair (including a wheelchair)?: A Little Help needed standing up from a chair using your arms (e.g., wheelchair or bedside chair)?: A Little Help needed to walk in hospital room?: A Little Help needed climbing 3-5 steps with a railing? : A Lot 6 Click Score: 16    End of Session Equipment Utilized During Treatment: Gait belt Activity Tolerance: Patient tolerated treatment well;Patient limited by lethargy Patient left: in chair;with family/visitor present;with call bell/phone within reach;with chair alarm set Nurse Communication: Mobility status PT Visit Diagnosis: Other abnormalities of gait and mobility (R26.89);Difficulty in walking, not elsewhere classified (R26.2);Unsteadiness  on feet (R26.81)    Time: 1610-9604 PT Time Calculation (min) (ACUTE ONLY): 37 min   Charges:                 Cecile Sheerer, SPT 02/16/23, 5:16 PM

## 2023-02-17 DIAGNOSIS — R55 Syncope and collapse: Secondary | ICD-10-CM | POA: Diagnosis not present

## 2023-02-17 LAB — RENAL FUNCTION PANEL
Albumin: 3.4 g/dL — ABNORMAL LOW (ref 3.5–5.0)
Anion gap: 7 (ref 5–15)
BUN: 25 mg/dL — ABNORMAL HIGH (ref 8–23)
CO2: 27 mmol/L (ref 22–32)
Calcium: 9 mg/dL (ref 8.9–10.3)
Chloride: 100 mmol/L (ref 98–111)
Creatinine, Ser: 1.07 mg/dL (ref 0.61–1.24)
GFR, Estimated: >60 mL/min (ref >60)
Glucose, Bld: 102 mg/dL — ABNORMAL HIGH (ref 70–99)
Phosphorus: 2.6 mg/dL (ref 2.5–4.6)
Potassium: 3.8 mmol/L (ref 3.5–5.1)
Sodium: 134 mmol/L — ABNORMAL LOW (ref 135–145)

## 2023-02-17 LAB — CBC
HCT: 42.5 % (ref 39.0–52.0)
Hemoglobin: 14.8 g/dL (ref 13.0–17.0)
MCH: 33.4 pg (ref 26.0–34.0)
MCHC: 34.8 g/dL (ref 30.0–36.0)
MCV: 95.9 fL (ref 80.0–100.0)
Platelets: 165 10*3/uL (ref 150–400)
RBC: 4.43 MIL/uL (ref 4.22–5.81)
RDW: 12.5 % (ref 11.5–15.5)
WBC: 7.7 10*3/uL (ref 4.0–10.5)
nRBC: 0 % (ref 0.0–0.2)

## 2023-02-17 LAB — GLUCOSE, CAPILLARY
Glucose-Capillary: 87 mg/dL (ref 70–99)
Glucose-Capillary: 89 mg/dL (ref 70–99)
Glucose-Capillary: 77 mg/dL (ref 70–99)
Glucose-Capillary: 103 mg/dL — ABNORMAL HIGH (ref 70–99)
Glucose-Capillary: 121 mg/dL — ABNORMAL HIGH (ref 70–99)

## 2023-02-17 NOTE — Plan of Care (Signed)
  Problem: Education: Goal: Knowledge of disease or condition will improve Outcome: Progressing Goal: Knowledge of secondary prevention will improve (MUST DOCUMENT ALL) Outcome: Progressing Goal: Knowledge of patient specific risk factors will improve (Mark N/A or DELETE if not current risk factor) Outcome: Progressing   Problem: Intracerebral Hemorrhage Tissue Perfusion: Goal: Complications of Intracerebral Hemorrhage will be minimized Outcome: Progressing   Problem: Coping: Goal: Will verbalize positive feelings about self Outcome: Progressing Goal: Will identify appropriate support needs Outcome: Progressing   Problem: Health Behavior/Discharge Planning: Goal: Ability to manage health-related needs will improve Outcome: Progressing Goal: Goals will be collaboratively established with patient/family Outcome: Progressing   Problem: Self-Care: Goal: Ability to participate in self-care as condition permits will improve Outcome: Progressing Goal: Verbalization of feelings and concerns over difficulty with self-care will improve Outcome: Progressing Goal: Ability to communicate needs accurately will improve Outcome: Progressing   Problem: Nutrition: Goal: Risk of aspiration will decrease Outcome: Progressing Goal: Dietary intake will improve Outcome: Progressing   Problem: Education: Goal: Knowledge of General Education information will improve Description: Including pain rating scale, medication(s)/side effects and non-pharmacologic comfort measures Outcome: Progressing   Problem: Health Behavior/Discharge Planning: Goal: Ability to manage health-related needs will improve Outcome: Progressing   Problem: Clinical Measurements: Goal: Ability to maintain clinical measurements within normal limits will improve Outcome: Progressing Goal: Will remain free from infection Outcome: Progressing Goal: Diagnostic test results will improve Outcome: Progressing Goal:  Respiratory complications will improve Outcome: Progressing Goal: Cardiovascular complication will be avoided Outcome: Progressing   Problem: Activity: Goal: Risk for activity intolerance will decrease Outcome: Progressing   Problem: Nutrition: Goal: Adequate nutrition will be maintained Outcome: Progressing   Problem: Coping: Goal: Level of anxiety will decrease Outcome: Progressing   Problem: Elimination: Goal: Will not experience complications related to bowel motility Outcome: Progressing Goal: Will not experience complications related to urinary retention Outcome: Progressing   Problem: Pain Managment: Goal: General experience of comfort will improve Outcome: Progressing   Problem: Safety: Goal: Ability to remain free from injury will improve Outcome: Progressing   Problem: Skin Integrity: Goal: Risk for impaired skin integrity will decrease Outcome: Progressing   

## 2023-02-17 NOTE — Progress Notes (Signed)
Patients NIH score had a noted changed on the midnight assessment. Patient had previously scored a 4 based on LOC questions and alertness, best language and dysarthria, Expressive aphasia. On reassessment at midnight the patient was unable to correctly follow my finger or tell me the number of fingers I held up.  He was unchanged on his language.  When asked to tell what he saw in the picture he only pointed to the chair and said oven. Could not identify any words.  This is a change from previous assessment.  Manuela Schwartz NP notified and assessed patient in room.

## 2023-02-17 NOTE — Progress Notes (Signed)
Subjective: No significant change  Exam: Vitals:   02/17/23 0753 02/17/23 1135  BP: (!) 141/77 105/64  Pulse: 72 74  Resp: 17   Temp: 98.2 F (36.8 C)   SpO2: 98%    Gen: In bed, NAD Resp: non-labored breathing, no acute distress Abd: soft, nt  Neuro: MS: sleepy but rousable, interactive and appropriate, oriented to person, place, age, but not month or year HY:QMVH, VFF Motor: does not cooperate well, but is able to give good symmetric resistance in BUE and lift BLE equally Sensory:intact to LT  Pertinent Labs: Cr 0.87  Impression: 82 year old male with small pontine hemorrhage.  It is possible that this is hypertensive, though with him being off of his anticoagulation for several days prior, it is also possible that he had a small ischemic infarct with hemorrhagic conversion upon resumption of his anticoagulation.   Given the location and size, I would have expected more symptoms, so the possibility of a cavernoma that bled into the cavernoma is also distinctly possible. I would favor holding off on anticoagulation until repeat imaging.   I also think that he has some degree of delirium which appears to be improving.   Care is supportive at this time.   Recommendations: 1) continue holding anticoagulation or antithrombotics until follow up.  2) strict maintenance of blood pressure less than 150 3) PT, OT, ST 4) neurology will be available as needed.   Ritta Slot, MD [facility] [phone]  If 7pm- 7am, please page neurology on call as listed in AMION.

## 2023-02-17 NOTE — Hospital Course (Signed)
82 yo M with PMHx of A-flutter on Eliquis, diverticular bleed, prostate cancer s/p RT, OSA (not on CPAP), Anxiety, HTN, HLD, prior tobacco use who presented to [facility] ED on 02/14/2023 for evaluation of near syncopal episodes with diaphoresis, dizziness with weakness and dry heaving.  Initially admitted to ICU.  TR[facility] service assumed care 02/17/2023.  HPI on admission & significant events: "History obtained per chart review and bedside patient report. Patient reports being in his normal state of health until early in the morning on 10/2. He was at home in bed when he began to feel extremely weak with some tremors in his Right leg and sat down. He then describes breaking out in a sweat and too weak to get off the ground. This lasted for 30 minutes-1 hour. He ultimately was able to get himself back up and into bed and go back to sleep. He had another recurrence of symptoms when he got out of bed for the day that included nausea without vomiting. He does endorse feeling tired and lightheaded as well as a headache. He denied pain, blurred vision, shortness of breath, chest pain, no overt palpitations.  Of note patient on a reduced dose of Eliquis at 2.5 mg BID due to history of diverticular bleeding and hematuria. Patient has declined Watchman device/ cardioversion previously.   ED course: Upon arrival patient alert and oriented with stable vitals in Atrial flutter. Labs reassuring with the exception of hypomagnesemia. Imaging revealed small pontine ICH. NSUR alerted, patient not a candidate for surgical intervention at this time, recommending Neurology consultation. Neurology recommended admission to ICU with strict BP control 130-150 and follow up imaging to monitor ICH overnight. Eliquis reversed."  Admitted to ICU with  Small pontine ICH, started on cleviprex drip for HTN.   Further hospital course and management as outlined below.   Consults:  Neurology

## 2023-02-17 NOTE — Plan of Care (Signed)
  Problem: Education: Goal: Knowledge of disease or condition will improve Outcome: Progressing Goal: Knowledge of secondary prevention will improve (MUST DOCUMENT ALL) Outcome: Progressing Goal: Knowledge of patient specific risk factors will improve Loraine Leriche N/A or DELETE if not current risk factor) Outcome: Progressing   Problem: Intracerebral Hemorrhage Tissue Perfusion: Goal: Complications of Intracerebral Hemorrhage will be minimized Outcome: Progressing

## 2023-02-17 NOTE — Progress Notes (Signed)
Progress Note   Patient: Ryan Allison ZOX:[id number] DOB: February 16, 1941 DOA: 02/14/2023     4 DOS: the patient was seen and examined on 02/18/2023   Brief hospital course: 82 yo M with PMHx of A-flutter on Eliquis, diverticular bleed, prostate cancer s/p RT, OSA (not on CPAP), Anxiety, HTN, HLD, prior tobacco use who presented to [facility] ED on 02/14/2023 for evaluation of near syncopal episodes with diaphoresis, dizziness with weakness and dry heaving.  Initially admitted to ICU.  TRH service assumed care 02/17/2023.  HPI on admission & significant events: "History obtained per chart review and bedside patient report. Patient reports being in his normal state of health until early in the morning on 10/2. He was at home in bed when he began to feel extremely weak with some tremors in his Right leg and sat down. He then describes breaking out in a sweat and too weak to get off the ground. This lasted for 30 minutes-1 hour. He ultimately was able to get himself back up and into bed and go back to sleep. He had another recurrence of symptoms when he got out of bed for the day that included nausea without vomiting. He does endorse feeling tired and lightheaded as well as a headache. He denied pain, blurred vision, shortness of breath, chest pain, no overt palpitations.  Of note patient on a reduced dose of Eliquis at 2.5 mg BID due to history of diverticular bleeding and hematuria. Patient has declined Watchman device/ cardioversion previously.   ED course: Upon arrival patient alert and oriented with stable vitals in Atrial flutter. Labs reassuring with the exception of hypomagnesemia. Imaging revealed small pontine ICH. NSUR alerted, patient not a candidate for surgical intervention at this time, recommending Neurology consultation. Neurology recommended admission to ICU with strict BP control 130-150 and follow up imaging to monitor ICH overnight. Eliquis reversed."  Admitted to ICU with  Small pontine ICH,  started on cleviprex drip for HTN.   Further hospital course and management as outlined below.   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   --Neurology consulted --Strict BP control with SBP < 150 --Hold off anticoagulation or antithrombotics until follow up --PT/OT/SLP following --CIR recommended - pending   Near syncope Mostly likely due to his ICH Continue PT/OT Fall precautions  Paroxysmal atrial fibrillation (HCC) A-flutter --  HR's controlled Holding Eliquis given ICH Continue metoprolol Telemetry monitoring  Hypomagnesemia Mg 1.5 this AM (10/6) --IV Mg-sulfate ordered for replacement --Maintain Mg > 2 given A-fib/flutter  Hyperlipidemia On Zocor  Hypoglycemia Has been on D10w since in ICU CBG's remain borderline in 70's to 90's for most part.   --Encourage PO intake --Offer juices, snacks between meals --Trial off D10 fluids --Monitor CBG's --Hypoglycemia protocol  HTN (hypertension) On metoprolol 12.5 mg BID PRN IV hydralazine if SBP>150 Per Neurology, strict BP control to keep SBP < 150 Titrate regimen as needed  ICH (intracerebral hemorrhage) (HCC) See Pontine hemorrhage        Subjective: Pt seen with male neighbor at bedside today.  Pt was awake but mostly kept eyes closed. Would communicate but minimally.  Denies headache or other complaints.  States he just wants to get home, get out of here.  Physical Exam: Vitals:   02/18/23 0450 02/18/23 0500 02/18/23 0827 02/18/23 1217  BP: (!) 164/87  136/71 124/78  Pulse:   72 72  Resp:   16 18  Temp: 98.7 F (37.1 C)  (!) 97.5 F (36.4 C) 98.4 F (36.9 C)  TempSrc: Oral     SpO2:   100% 100%  Weight:  70.3 kg    Height:       General exam: drowsy but arousable, interacts appropriately when  engaged, no acute distress HEENT: moist mucus membranes, hearing grossly normal  Respiratory system: CTAB, no wheezes, rales or rhonchi, normal respiratory effort. Cardiovascular system: normal S1/S2, RRR, no pedal edema.   Gastrointestinal system: soft, NT, ND, no HSM felt, +bowel sounds. Central nervous system: A&O x2+. no gross focal neurologic deficits, normal speech Skin: dry, intact, normal temperature Psychiatry: depressed mood, congruent affect   Data Reviewed:  Notable labs ---  Na 134, glucose 102, BUN 25, albumin 3.4   Family Communication: Rosalie friend & neighbor at bedside  Disposition: Status is: Inpatient Remains inpatient appropriate because: awaiting CIR placement / authorization   Planned Discharge Destination:  CIR    Time spent: 45 minutes  Author: Pennie Banter, DO 02/18/2023 2:40 PM  For on call review [email or web address].

## 2023-02-18 DIAGNOSIS — I613 Nontraumatic intracerebral hemorrhage in brain stem: Secondary | ICD-10-CM | POA: Diagnosis not present

## 2023-02-18 DIAGNOSIS — E162 Hypoglycemia, unspecified: Secondary | ICD-10-CM | POA: Clinically undetermined

## 2023-02-18 LAB — RENAL FUNCTION PANEL
Albumin: 3.1 g/dL — ABNORMAL LOW (ref 3.5–5.0)
Anion gap: 9 (ref 5–15)
BUN: 22 mg/dL (ref 8–23)
CO2: 24 mmol/L (ref 22–32)
Calcium: 8.6 mg/dL — ABNORMAL LOW (ref 8.9–10.3)
Chloride: 99 mmol/L (ref 98–111)
Creatinine, Ser: 1.02 mg/dL (ref 0.61–1.24)
GFR, Estimated: >60 mL/min (ref >60)
Glucose, Bld: 97 mg/dL (ref 70–99)
Phosphorus: 3 mg/dL (ref 2.5–4.6)
Potassium: 3.6 mmol/L (ref 3.5–5.1)
Sodium: 132 mmol/L — ABNORMAL LOW (ref 135–145)

## 2023-02-18 LAB — CBC
HCT: 39.3 % (ref 39.0–52.0)
Hemoglobin: 13.8 g/dL (ref 13.0–17.0)
MCH: 33.8 pg (ref 26.0–34.0)
MCHC: 35.1 g/dL (ref 30.0–36.0)
MCV: 96.3 fL (ref 80.0–100.0)
Platelets: 158 10*3/uL (ref 150–400)
RBC: 4.08 MIL/uL — ABNORMAL LOW (ref 4.22–5.81)
RDW: 12.6 % (ref 11.5–15.5)
WBC: 7.1 10*3/uL (ref 4.0–10.5)
nRBC: 0 % (ref 0.0–0.2)

## 2023-02-18 LAB — GLUCOSE, CAPILLARY
Glucose-Capillary: 87 mg/dL (ref 70–99)
Glucose-Capillary: 98 mg/dL (ref 70–99)
Glucose-Capillary: 81 mg/dL (ref 70–99)
Glucose-Capillary: 95 mg/dL (ref 70–99)
Glucose-Capillary: 127 mg/dL — ABNORMAL HIGH (ref 70–99)

## 2023-02-18 LAB — MAGNESIUM: Magnesium: 1.5 mg/dL — ABNORMAL LOW (ref 1.7–2.4)

## 2023-02-18 MED ORDER — POTASSIUM CHLORIDE CRYS ER 20 MEQ PO TBCR
40.0000 meq | EXTENDED_RELEASE_TABLET | Freq: Once | ORAL | Status: AC
  Administered 2023-02-18: 40 meq via ORAL
  Filled 2023-02-18: qty 2

## 2023-02-18 MED ORDER — MAGNESIUM SULFATE 2 GM/50ML IV SOLN
2.0000 g | Freq: Once | INTRAVENOUS | Status: AC
  Administered 2023-02-18: 2 g via INTRAVENOUS
  Filled 2023-02-18: qty 50

## 2023-02-18 NOTE — Assessment & Plan Note (Signed)
Mostly likely due to his ICH Continue PT/OT Fall precautions

## 2023-02-18 NOTE — Progress Notes (Signed)
CROSS COVER NOTE  NAME: Ryan Allison MRN: [id number] DOB : 1941-04-28   Nurse paged " telemetry stated pt had 4:1 to 8:1 conduction with hr dropping to 42. He also had R on T PVC's. Pt base line rhythm is aflutter. He denies having any chest pain". The rhythm was not sustained and patient was not symptomatic, VS: 164/82, 98.7, 100% on RA.  [name] [name] [name], MSN, APRN, AGACNP-BC [facility] [place] Pager: [phone]. Check Amion for Availability

## 2023-02-18 NOTE — Evaluation (Signed)
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   Hypomagnesemia 02/14/2023   Near syncope 02/14/2023   HTN (hypertension) 02/14/2023   ICH (intracerebral hemorrhage) (HCC) 02/14/2023   Cervical spondylosis with myelopathy 06/21/2022   Hemorrhage of colon due to diverticulosis 06/21/2022   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/2022   Post-traumatic stress disorder, chronic 06/21/2022   Mild protein-calorie malnutrition (HCC) 05/30/2022   Gross hematuria 03/19/2019   Nephrolithiasis 03/19/2019   Atrial flutter (HCC) 09/30/2018   OSA on CPAP 09/30/2018   Acute blood loss anemia 09/30/2018   Encounter for anticoagulation discussion and counseling 09/30/2018   Small bowel obstruction (HCC) 08/05/2018   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) 08/02/2018   Paroxysmal atrial fibrillation (HCC) 06/19/2018   Hematochezia    Internal hemorrhoids    Ulcer of anus and rectum    Diverticulosis of large intestine without diverticulitis    B12 deficiency 01/30/2017   Diplopia 01/30/2017   Diverticulosis 01/30/2017   Hyperlipidemia 01/30/2017   Kyphoscoliosis 01/30/2017   Malignant neoplasm of prostate (HCC) 01/30/2017   Osteopenia 01/30/2017   Peripheral polyneuropathy 01/30/2017   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   Status post eye surgery 07/17/2016   Acute gastrointestinal hemorrhage 05/06/2016   Cancer of prostate (HCC) 04/03/2016   Aortic atherosclerosis (HCC) 10/21/2015   DDD (degenerative disc disease), lumbar 10/21/2015   GERD (gastroesophageal reflux disease) 01/20/2014   Esotropia 03/12/2012   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   Myopia of both eyes  03/12/2012   ED (erectile dysfunction) of organic origin 02/19/2012   Amblyopia of right eye 02/06/2012   Senile nuclear sclerosis 02/06/2012   Past Medical History:  Past Medical History:  Diagnosis Date   Amblyopia of right eye    Anxiety    Aortic atherosclerosis (HCC)    Arthritis    Atrial fibrillation and flutter (HCC) 05/2018   a.) CHA2DS2VASc = 4 (age x 2, HTN, vascular disease history); b.) rate/rhythm maintained on oral metoprolol tartrate; chronically anticoagulated with dose reduced apixaban (experienced hematuria on rivaroxaban)   B12 deficiency    Cervical spondylosis with myelopathy    Chronic kidney disease, stage 3 (HCC)    DDD (degenerative disc disease), lumbar    Diverticulosis    Esophageal dysphagia    External hemorrhoids    GERD (gastroesophageal reflux disease)    GI bleed    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   History of echocardiogram    a.) TTE 06/07/2018: EF 60-65%, no RWMAs, mild MR, PASP 33 mmHg; b.) TTE 09/19/2019: EF 50-55%, mild LVH, mod RVE, mild LA dil, mild-mod TR, mild MR, PASP 42.5; c.) TTE 09/14/2022: EF 60-65%, mod basal-septal LVH, mild MR/AR, mod TR, AoV sclerosis, Ao root 37 mm   History of GI diverticular bleed    a.) required prolonged ICU admission at [facility]   History of kidney stones    HLD (hyperlipidemia)    Hyperlipidemia    Hypertension    Kyphoscoliosis    Meniscus tear    On apixaban therapy    OSA (obstructive sleep apnea)    a.) does not require nocturnal PAP therapy   Osteopenia    Peripheral neuropathy    Prostate cancer (HCC) 2011  PTSD (post-traumatic stress disorder)    RBBB (right bundle branch block)    SBO (small bowel obstruction) (HCC)    Skin cancer of scalp    Past Surgical History:  Past Surgical History:  Procedure Laterality Date   COLECTOMY  07/12/2018   with end ileostomy   COLONOSCOPY Left 06/07/2018   Procedure: COLONOSCOPY;  Surgeon: Pasty Spillers, MD;  Location: [facility] ENDOSCOPY;  Service:  Endoscopy;  Laterality: Left;   COLONOSCOPY WITH ESOPHAGOGASTRODUODENOSCOPY (EGD)  09/12/2010   COLONOSCOPY WITH PROPOFOL N/A 04/06/2015   Procedure: COLONOSCOPY WITH PROPOFOL;  Surgeon: Christena Deem, MD;  Location: [facility] ENDOSCOPY;  Service: Endoscopy;  Laterality: N/A;   COLONOSCOPY WITH PROPOFOL N/A 06/07/2018   Procedure: COLONOSCOPY WITH PROPOFOL;  Surgeon: Pasty Spillers, MD;  Location: [facility] ENDOSCOPY;  Service: Endoscopy;  Laterality: N/A;   HAMMER TOE SURGERY Left 01/05/2023   Procedure: HAMMER TOE CORRECTION; INJECTION OF FAT PAD GRAFT;  Surgeon: [name], DPM;  Location: [facility] ORS;  Service: Podiatry;  Laterality: Left;   INGUINAL HERNIA REPAIR Left 09/23/2014   Procedure: HERNIA REPAIR INGUINAL ADULT;  Surgeon: Kieth Brightly, MD;  Location: [facility] ORS;  Service: General;  Laterality: Left;   LASIK  2000   MENISCUS REPAIR Left 05/15/2006   MOHS SURGERY     right scalp   SKIN GRAFT FULL THICKNESS ARM Right 1966   [place] mine   STRABISMUS SURGERY Right 07/17/2016   VASCULAR SURGERY  05/15/2013   s/p GI Bleed   HPI:  [name] is a 82 y.o. 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 Neurology workup concerning for hospital delirium and the possibility of a cavernoma that bled into the cavernoma   Assessment / Plan / Recommendation Clinical Impression  Pt seen for speech/language evaluation. Pt received upright in reclined. Alert, pleasant, and slow to respond at times. Kyphosis evident. Evaluation completed via informal means and portions of Western Aphasia Battery - Revised (Bedside Record Form). Pt presents with mild receptive and expressive language  deficits including reduced auditory comprehension for complex information (e.g. complex yes/no, 2 step commands), anomia/paucity of speech during informal conversation exchanges, and impaired divergent naming (x3 animals in 60s), and impaired pragmatics (e.g. flat affect, monotone, reduced topic maintenance). Pt's speech is c/b low vocal loudness and short length of utterance with little elaboration. A full cognitive linguistic evaluation was deferred due to pt's reports of  increased lethargy; however, pt oriented to person, place, and situation (not to date, DOW) and needed cueing to recall and demonstrate use of call bell. Pt would benefit from continued SLP services acute/post-acute given above mentioned deficits. SLP to f/u per POC for further functional/dynamic assessment.    SLP Assessment  SLP Recommendation/Assessment: Patient needs continued Speech Lanaguage Pathology Services SLP Visit Diagnosis: Cognitive communication deficit (R41.841)    Recommendations for follow up therapy are one component of a multi-disciplinary discharge planning process, led by the attending physician.  Recommendations may be updated based on patient status, additional functional criteria and insurance authorization.    Follow Up Recommendations  Acute inpatient rehab (3hours/day)    Assistance Recommended at Discharge  Frequent or constant Supervision/Assistance  Functional Status Assessment Patient has had a recent decline in  their functional status and demonstrates the ability to make significant improvements in function in a reasonable and predictable amount of time.  Frequency and Duration min 2x/week  2 weeks      SLP Evaluation Cognition  Overall Cognitive Status: Within Functional Limits for tasks assessed Arousal/Alertness: Awake/alert Orientation Level: Oriented to person;Oriented to place;Oriented to situation (not to date/DOW) Problem Solving: Impaired Problem Solving Impairment:  (unable to  recall/demo use of call bell without cues)       Comprehension  Auditory Comprehension Overall Auditory Comprehension: Impaired Yes/No Questions: Impaired Complex Questions: 75-100% accurate Commands: Impaired Two Step Basic Commands: 75-100% accurate    Expression Expression Primary Mode of Expression: Verbal Verbal Expression Overall Verbal Expression: Impaired Initiation: No impairment Automatic Speech: Name;Social Response Level of Generative/Spontaneous Verbalization: Phrase;Sentence Repetition: No impairment Naming: Impairment Responsive:  (WFL) Confrontation:  (5/5 common object) Divergent:  (3 animals in 60s) Pragmatics: Impairment Impairments: Abnormal affect;Monotone;Topic maintenance   Oral / Motor  Oral Motor/Sensory Function Overall Oral Motor/Sensory Function: Within functional limits Motor Speech Overall Motor Speech: Appears within functional limits for tasks assessed Respiration: Within functional limits Phonation: Low vocal intensity Resonance: Within functional limits Articulation: Within functional limitis Intelligibility: Intelligible Motor Planning: Witnin functional limits           [name], M.S., CCC-SLP Speech-Language Pathologist [facility] [phone] (ASCOM)  [name] 02/18/2023, 1:26 PM

## 2023-02-18 NOTE — Assessment & Plan Note (Signed)
RESOLVED. Was on D10w, now off and no hypoglycemic episodes. --Encourage PO intake --Offer juices, snacks between meals --Monitor CBG's --Hypoglycemia protocol

## 2023-02-18 NOTE — Progress Notes (Signed)
Progress Note   Patient: Ryan Allison MWU:[id number] DOB: [birth year] DOA: 02/14/2023     4 DOS: the patient was seen and examined on 02/18/2023   Brief hospital course: 82 yo M with PMHx of A-flutter on Eliquis, diverticular bleed, prostate cancer s/p RT, OSA (not on CPAP), Anxiety, HTN, HLD, prior tobacco use who presented to [facility] ED on 02/14/2023 for evaluation of near syncopal episodes with diaphoresis, dizziness with weakness and dry heaving.  Initially admitted to ICU.  TRH service assumed care 02/17/2023.  HPI on admission & significant events: "History obtained per chart review and bedside patient report. Patient reports being in his normal state of health until early in the morning on 10/2. He was at home in bed when he began to feel extremely weak with some tremors in his Right leg and sat down. He then describes breaking out in a sweat and too weak to get off the ground. This lasted for 30 minutes-1 hour. He ultimately was able to get himself back up and into bed and go back to sleep. He had another recurrence of symptoms when he got out of bed for the day that included nausea without vomiting. He does endorse feeling tired and lightheaded as well as a headache. He denied pain, blurred vision, shortness of breath, chest pain, no overt palpitations.  Of note patient on a reduced dose of Eliquis at 2.5 mg BID due to history of diverticular bleeding and hematuria. Patient has declined Watchman device/ cardioversion previously.   ED course: Upon arrival patient alert and oriented with stable vitals in Atrial flutter. Labs reassuring with the exception of hypomagnesemia. Imaging revealed small pontine ICH. NSUR alerted, patient not a candidate for surgical intervention at this time, recommending Neurology consultation. Neurology recommended admission to ICU with strict BP control 130-150 and follow up imaging to monitor ICH overnight. Eliquis reversed."  Admitted to ICU with  Small pontine ICH,  started on cleviprex drip for HTN.   Further hospital course and management as outlined below.   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   --Neurology consulted --Strict BP control with SBP < 150 --Hold off anticoagulation or antithrombotics until follow up --PT/OT/SLP following --CIR recommended - pending   Near syncope Mostly likely due to his ICH Continue PT/OT Fall precautions  Paroxysmal atrial fibrillation (HCC) A-flutter --  HR's controlled Holding Eliquis given ICH Continue metoprolol Telemetry monitoring  Hypomagnesemia Mg 1.5 this AM (10/6) --IV Mg-sulfate ordered for replacement --Maintain Mg > 2 given A-fib/flutter  Hyperlipidemia On Zocor  Hypoglycemia Has been on D10w since in ICU CBG's remain borderline in 70's to 90's for most part.   --Encourage PO intake --Offer juices, snacks between meals --Trial off D10 fluids --Monitor CBG's --Hypoglycemia protocol  HTN (hypertension) On metoprolol 12.5 mg BID PRN IV hydralazine if SBP>150 Per Neurology, strict BP control to keep SBP < 150 Titrate regimen as needed  ICH (intracerebral hemorrhage) (HCC) See Pontine hemorrhage        Subjective: Pt seen with Rosalie at bedside today.  He was more awake and interactive with me than yesterday.  Asks when he can get out of here.  Denies acute complaints.  We discussed needing to get him off "sugar fluids", that his sugar is borderline low while on dextrose fluids.  Encouraged him  to increase PO intake and we'll see how he does off the dextrose fluids.  He expressed agreement and understanding that need for IV fluids will keep him here in the hospital longer.   Physical Exam: Vitals:   02/18/23 0450 02/18/23 0500 02/18/23 0827 02/18/23 1217  BP: (!) 164/87   136/71 124/78  Pulse:   72 72  Resp:   16 18  Temp: 98.7 F (37.1 C)  (!) 97.5 F (36.4 C) 98.4 F (36.9 C)  TempSrc: Oral     SpO2:   100% 100%  Weight:  70.3 kg    Height:       General exam: awake, more alert and interactive today, conversational, no acute distress HEENT: moist mucus membranes, hearing grossly normal  Respiratory system: CTAB, no wheezes, rales or rhonchi, normal respiratory effort. Cardiovascular system: normal S1/S2, RRR, no pedal edema.   Gastrointestinal system: soft, NT, ND, no HSM felt, +bowel sounds. Central nervous system: A&O x2+. no gross focal neurologic deficits, normal speech Skin: dry, intact, normal temperature Psychiatry: depressed mood, congruent affect   Data Reviewed:  Notable labs ---  Na 134 >> 132, albumin 3.1, Mg 1.5  CBG's 89 >> 77 >> 103 >> 121 >> 87 >> 98 >> 81 (on D10w fluids)   Family Communication: Rosalie friend & neighbor at bedside  Disposition: Status is: Inpatient Remains inpatient appropriate because: awaiting CIR placement / authorization   Planned Discharge Destination:  CIR    Time spent: 45 minutes  Author: Pennie Banter, DO 02/18/2023 2:39 PM  For on call review [email or web address].

## 2023-02-18 NOTE — Assessment & Plan Note (Signed)
On Zocor 

## 2023-02-18 NOTE — Plan of Care (Signed)
  Problem: Education: Goal: Knowledge of disease or condition will improve Outcome: Progressing Goal: Knowledge of secondary prevention will improve (MUST DOCUMENT ALL) Outcome: Progressing   Problem: Self-Care: Goal: Ability to participate in self-care as condition permits will improve Outcome: Progressing   Problem: Nutrition: Goal: Risk of aspiration will decrease Outcome: Progressing

## 2023-02-18 NOTE — Assessment & Plan Note (Signed)
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   --Neurology consulted --Strict BP control with SBP < 150 --Hold off anticoagulation or antithrombotics until follow up --PT/OT/SLP following --CIR recommended - pending

## 2023-02-18 NOTE — TOC Progression Note (Signed)
Transition of Care [facility]) - Progression Note    Patient Details  Name: Ryan Allison MRN: [id number] Date of Birth: 03-Feb-1941  Transition of [facility] CM/SW Contact  Susa Simmonds, [place] Phone Number: 02/18/2023, 9:09 AM  Clinical Narrative:  CIR will review patient to see if he qualifies.          Expected Discharge Plan and Services                                               Social Determinants of Health (SDOH) Interventions SDOH Screenings   Food Insecurity: Patient Unable To Answer (02/16/2023)  Housing: Patient Unable To Answer (02/16/2023)  Transportation Needs: Patient Unable To Answer (02/16/2023)  Utilities: Patient Unable To Answer (02/16/2023)  Depression (PHQ2-9): Medium Risk (02/23/2020)  Financial Resource Strain: Low Risk  (01/11/2023)   Received from [facility]  Tobacco Use: Medium Risk (02/14/2023)   Received from [facility]    Readmission Risk Interventions     No data to display

## 2023-02-18 NOTE — Progress Notes (Addendum)
SLP Cancellation Note  Patient Details Name: Ryan Allison MRN: [id number] DOB: 03/24/1941   Cancelled treatment:       Reason Eval/Treat Not Completed: Patient at procedure or test/unavailable (Pt with provider. Will continue efforts as appropriate.) Addendum @ 1157: pt with PTA. Will continue efforts as appropriate.  Times attempted: 1105, 1157  [name], M.S., CCC-SLP Speech-Language Pathologist [facility] [phone] Ryan Allison)  [name] 02/18/2023, 11:31 AM

## 2023-02-18 NOTE — Assessment & Plan Note (Signed)
A-flutter --  HR's controlled Holding Eliquis given ICH Continue metoprolol Telemetry monitoring

## 2023-02-18 NOTE — Assessment & Plan Note (Signed)
Replaced on 10/5 for Mg 1.5 --Maintain Mg > 2 given A-fib/flutter

## 2023-02-18 NOTE — Assessment & Plan Note (Signed)
See Pontine hemorrhage

## 2023-02-18 NOTE — Progress Notes (Signed)
Physical Therapy Treatment Patient Details Name: Ryan Allison MRN: [id number] DOB: [birth year] Today's Date: 02/18/2023   History of Present Illness [name] is a 82 y.o. 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    PT Comments  Fatigued this am but willing to try.  He is able to get to EOB with min a x 1 , cues and increased time.  Steady in sitting.  RN called to empty colostomy bag before further mobility.  Stands with CGA x 1 and transfers slowly to recliner.  After path is cleared, he is able to walk 15' x 1 in room with RW and CGA x 1.  Generally fatigued with effort but remains standing for a short time upon return to chair.  Remained up with needs in reach.   If plan is discharge home, recommend the following: Help with stairs or ramp for entrance;Assist for transportation;A little help with walking and/or transfers;A little help with bathing/dressing/bathroom;Supervision due to cognitive status;Direct supervision/assist for financial management;Direct supervision/assist for medications management;Assistance with cooking/housework   Can travel by private vehicle        Equipment Recommendations       Recommendations for Other Services       Precautions / Restrictions Precautions Precautions: Fall Restrictions Weight Bearing Restrictions: No     Mobility  Bed Mobility Overal bed mobility: Needs Assistance Bed Mobility: Supine to Sit     Supine to sit: Min assist, Contact guard     General bed mobility comments: increased time and cues Patient Response: Cooperative  Transfers Overall transfer level: Needs assistance Equipment used: Rolling walker (2  wheels) Transfers: Sit to/from Stand Sit to Stand: Contact guard assist           General transfer comment: pt able to stand after moments of hesitation, no physical assist required    Ambulation/Gait Ambulation/Gait assistance: Contact guard assist Gait Distance (Feet): 15 Feet Assistive device: Rolling walker (2 wheels) Gait Pattern/deviations: Step-through pattern Gait velocity: decresed     General Gait Details: Pt walking with notable kyphotic posture, steady steps but very slow. able to take steps forwrds and backwards, cues provided for RW management   Stairs             Wheelchair Mobility     Tilt Bed Tilt Bed Patient Response: Cooperative  Modified Rankin (Stroke Patients Only)       Balance Overall balance assessment: Needs assistance Sitting-balance support: Feet supported, Bilateral upper extremity supported Sitting balance-Leahy Scale: Good     Standing balance support: During functional activity, Bilateral upper extremity supported Standing balance-Leahy Scale: Fair                              Cognition Arousal: Alert Behavior During Therapy: Flat affect, WFL for tasks assessed/performed Overall Cognitive Status: Within Functional Limits for tasks assessed                                          Exercises      General Comments        Pertinent  Vitals/Pain Pain Assessment Pain Assessment: No/denies pain    Home Living     Available Help at Discharge: Neighbor Type of Home: House                  Prior Function            PT Goals (current goals can now be found in the care plan section) Progress towards PT goals: Progressing toward goals    Frequency    Min 1X/week      PT Plan      Co-evaluation              AM-PAC PT "6 Clicks" Mobility   Outcome Measure  Help needed turning from your back to your side while in a flat bed without using bedrails?: A Little Help  needed moving from lying on your back to sitting on the side of a flat bed without using bedrails?: A Lot Help needed moving to and from a bed to a chair (including a wheelchair)?: A Little Help needed standing up from a chair using your arms (e.g., wheelchair or bedside chair)?: A Little Help needed to walk in hospital room?: A Little Help needed climbing 3-5 steps with a railing? : A Lot 6 Click Score: 16    End of Session Equipment Utilized During Treatment: Gait belt Activity Tolerance: Patient tolerated treatment well;Patient limited by lethargy Patient left: in chair;with family/visitor present;with call bell/phone within reach;with chair alarm set Nurse Communication: Mobility status PT Visit Diagnosis: Other abnormalities of gait and mobility (R26.89);Difficulty in walking, not elsewhere classified (R26.2);Unsteadiness on feet (R26.81)     Time: 3664-4034 PT Time Calculation (min) (ACUTE ONLY): 25 min  Charges:    $Gait Training: 8-22 mins PT General Charges $$ ACUTE PT VISIT: 1 Visit                   Danielle Dess, PTA 02/18/23, 1:40 PM

## 2023-02-18 NOTE — Progress Notes (Signed)
Inpatient Rehab Admissions Coordinator Note:   Per therapy patient was screened for CIR candidacy by Michiel Sivley Luvenia Starch, CCC-SLP. At this time, pt appears to be a potential candidate for CIR. I will place an order for rehab consult for full assessment, per our protocol.  Please note beds in CIR are extremely limited at this time. May need to consider referral to other area IPR if pt will be medically ready to discharge soon. Please contact me any with questions.Wolfgang Phoenix, MS, CCC-SLP Admissions Coordinator [phone] 02/18/23 12:05 PM

## 2023-02-18 NOTE — Assessment & Plan Note (Signed)
On metoprolol 12.5 mg BID PRN IV hydralazine if SBP>150 Per Neurology, strict BP control to keep SBP < 150 Titrate regimen as needed

## 2023-02-19 ENCOUNTER — Encounter: Payer: Self-pay | Admitting: Pulmonary Disease

## 2023-02-19 DIAGNOSIS — R799 Abnormal finding of blood chemistry, unspecified: Secondary | ICD-10-CM | POA: Clinically undetermined

## 2023-02-19 DIAGNOSIS — I613 Nontraumatic intracerebral hemorrhage in brain stem: Secondary | ICD-10-CM | POA: Diagnosis not present

## 2023-02-19 LAB — MAGNESIUM: Magnesium: 2.1 mg/dL (ref 1.7–2.4)

## 2023-02-19 LAB — RENAL FUNCTION PANEL
Albumin: 3.6 g/dL (ref 3.5–5.0)
Anion gap: 8 (ref 5–15)
BUN: 37 mg/dL — ABNORMAL HIGH (ref 8–23)
CO2: 25 mmol/L (ref 22–32)
Calcium: 9.3 mg/dL (ref 8.9–10.3)
Chloride: 101 mmol/L (ref 98–111)
Creatinine, Ser: 1.17 mg/dL (ref 0.61–1.24)
GFR, Estimated: >60 mL/min (ref >60)
Glucose, Bld: 94 mg/dL (ref 70–99)
Phosphorus: 4 mg/dL (ref 2.5–4.6)
Potassium: 4.6 mmol/L (ref 3.5–5.1)
Sodium: 134 mmol/L — ABNORMAL LOW (ref 135–145)

## 2023-02-19 LAB — GLUCOSE, CAPILLARY
Glucose-Capillary: 87 mg/dL (ref 70–99)
Glucose-Capillary: 126 mg/dL — ABNORMAL HIGH (ref 70–99)
Glucose-Capillary: 131 mg/dL — ABNORMAL HIGH (ref 70–99)
Glucose-Capillary: 82 mg/dL (ref 70–99)

## 2023-02-19 NOTE — Progress Notes (Signed)
Inpatient Rehab Admissions Coordinator:    I spoke with pt.'s son regarding potential CIR admit. He wants his father to get rehab but states that family is not local and not able to provide 24/7 support at discharge. I do believe that after a brief stay on rehab, Pt. Will still require 24/7 supervision due to new deficits in cognition (SLP documenting deficits in memory, problem solving, executive function, and attention). As a result, Pt. Will need placement in SNF. If families' ability to provide support changes. CIR can reconsider admit.   Megan Salon, MS, CCC-SLP Rehab Admissions Coordinator  [phone] (celll) [phone] (office)

## 2023-02-19 NOTE — Progress Notes (Signed)
Per Dr Denton Lank, dc tele monitoring and NIH/stroke order

## 2023-02-19 NOTE — Progress Notes (Signed)
Speech Language Pathology Treatment: Cognitive-Linquistic  Patient Details Name: Ryan Allison MRN: [id number] DOB: 1941-05-06 Today's Date: 02/19/2023 Time: 0840-0910 SLP Time Calculation (min) (ACUTE ONLY): 30 min  Assessment / Plan / Recommendation Clinical Impression  Pt seen for further functional/dynamic assessment. Pt alert and cooperative. Reports feeling "tired." Tearful at times about CLOF. Ryan Allison, at bedside. Assessment completed via SLUMS. Pt scored 10/30.  Pt presents with cognitive-linguistic deficits affecting attention, memory, problem solving, and executive functioning. Perseveration noted on clockdrawing task. Pt would benefit from intensive SLP services post-acute to maximize independence. SLP to f/u per POC for ongoing SLP services while pt in house.   HPI HPI: [name] is a 82 y.o. 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  Recommendations may be updated based on patient status, additional functional criteria and insurance authorization.    Recommendations               Frequent or constant Supervision/Assistance Cognitive communication deficit (K74.259)     Continue with current plan of care    Ryan Allison, M.S., CCC-SLP Speech-Language Pathologist [facility] [phone] Ryan Allison)  Ryan Allison  02/19/2023, 10:23 AM

## 2023-02-19 NOTE — Progress Notes (Signed)
Progress Note   Patient: Ryan Allison KGU:[id number] DOB: [birth year] DOA: 02/14/2023     5 DOS: the patient was seen and examined on 02/19/2023   Brief hospital course: 82 yo M with PMHx of A-flutter on Eliquis, diverticular bleed, prostate cancer s/p RT, OSA (not on CPAP), Anxiety, HTN, HLD, prior tobacco use who presented to [facility] ED on 02/14/2023 for evaluation of near syncopal episodes with diaphoresis, dizziness with weakness and dry heaving.  Initially admitted to ICU.  TRH service assumed care 02/17/2023.  HPI on admission & significant events: "History obtained per chart review and bedside patient report. Patient reports being in his normal state of health until early in the morning on 10/2. He was at home in bed when he began to feel extremely weak with some tremors in his Right leg and sat down. He then describes breaking out in a sweat and too weak to get off the ground. This lasted for 30 minutes-1 hour. He ultimately was able to get himself back up and into bed and go back to sleep. He had another recurrence of symptoms when he got out of bed for the day that included nausea without vomiting. He does endorse feeling tired and lightheaded as well as a headache. He denied pain, blurred vision, shortness of breath, chest pain, no overt palpitations.  Of note patient on a reduced dose of Eliquis at 2.5 mg BID due to history of diverticular bleeding and hematuria. Patient has declined Watchman device/ cardioversion previously.   ED course: Upon arrival patient alert and oriented with stable vitals in Atrial flutter. Labs reassuring with the exception of hypomagnesemia. Imaging revealed small pontine ICH. NSUR alerted, patient not a candidate for surgical intervention at this time, recommending Neurology consultation. Neurology recommended admission to ICU with strict BP control 130-150 and follow up imaging to monitor ICH overnight. Eliquis reversed."  Admitted to ICU with  Small pontine ICH,  started on cleviprex drip for HTN.   Further hospital course and management as outlined below.   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   --Neurology consulted --Strict BP control with SBP < 150 --Hold off anticoagulation or antithrombotics until follow up --PT/OT/SLP following --CIR recommended - pending   Near syncope Mostly likely due to his ICH Continue PT/OT Fall precautions  Paroxysmal atrial fibrillation (HCC) A-flutter --  HR's controlled Holding Eliquis given ICH Continue metoprolol Telemetry monitoring  Hypomagnesemia Replaced on 10/5 for Mg 1.5 --Maintain Mg > 2 given A-fib/flutter  Hyperlipidemia On Zocor  Hypoglycemia Had been on D10w since in ICU CBG's were still borderline in 70's to 90's for most part.  Stopped D10 fluids on 10/6.  No hypoglycemic episodes so far. --Encourage PO intake --Offer juices, snacks between meals --Monitor CBG's --Hypoglycemia protocol  HTN (hypertension) On metoprolol 12.5 mg BID PRN IV hydralazine if SBP>150 Per Neurology, strict BP control to keep SBP < 150 Titrate regimen as needed  Elevated BUN BUN increased from normal 22 >> 37 today. No evidence of upper GI bleeding. --Repeat CBC in AM --Monitor stools   ICH (intracerebral hemorrhage) (HCC) See Pontine hemorrhage        Subjective: Pt seen with Rosalie at bedside today.  He reports feeling well, just wants to get out of the hospital to rehab ASAP.  Denies any complaints.  Appetite and PO intake improving.    Physical Exam: Vitals:   02/19/23 0346 02/19/23 0500 02/19/23 0740 02/19/23 1140  BP: 124/72  124/64 116/61  Pulse: 73  73 95  Resp:   18 16  Temp: 98.1 F (36.7 C)  98 F (36.7 C) 97.6 F (36.4 C)  TempSrc: Oral   Oral  SpO2: 100%  100% 100%   Weight:  70.5 kg    Height:       General exam: awake,alert, talkative, no acute distress HEENT: moist mucus membranes, hearing grossly normal  Respiratory system: CTAB, no wheezes, rales or rhonchi, normal respiratory effort. Cardiovascular system: normal S1/S2, RRR, no pedal edema.   Gastrointestinal system: soft, NT, ND Central nervous system: A&O x2+. no gross focal neurologic deficits, normal speech Skin: dry, intact, normal temperature Psychiatry: normal mood, congruent affect   Data Reviewed:  Notable labs ---  Na 134, BUN 37 otherwise normal BMP  CBG's stable since off D10w yesterday: 95 >> 127 >> 87 >> 126   Family Communication: Rosalie friend & neighbor at bedside  Disposition: Status is: Inpatient Remains inpatient appropriate because: awaiting CIR placement / authorization   Planned Discharge Destination:  CIR    Time spent: 35 minutes  Author: Pennie Banter, DO 02/19/2023 2:58 PM  For on call review [email or web address].

## 2023-02-19 NOTE — Assessment & Plan Note (Signed)
BUN increased from normal 22 >> 37 today. No evidence of upper GI bleeding. --Repeat CBC in AM --Monitor stools

## 2023-02-19 NOTE — Progress Notes (Signed)
Physical Therapy Treatment Patient Details Name: Ryan Allison MRN: [id number] DOB: 1940/12/12 Today's Date: 02/19/2023   History of Present Illness [name] is a 82 y.o. 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    PT Comments  Pt in bed.  Appears and endorses feeling better.  He is able to transition to sitting with increased time and cues. Steady in sitting.  He is able to stand from low bed height but struggles and needs min a x 1 to gain balance.  He stands with CGA from proper chair with arms.  He is able to progress gait 120' x 2 with RW and cga x 1 but lateral and post sway is noted increasing risk for falls.  He is generally fatigued with effort but remains in chair after session.  CIR is not an option for pt at this time.  Adjusted recs to SNF to assist with discharge planning.    If plan is discharge home, recommend the following: Help with stairs or ramp for entrance;Assist for transportation;A little help with walking and/or transfers;A little help with bathing/dressing/bathroom;Supervision due to cognitive status;Direct supervision/assist for financial management;Direct supervision/assist for medications management;Assistance with cooking/housework   Can travel by private vehicle        Equipment Recommendations       Recommendations for Other Services       Precautions / Restrictions Precautions Precautions: Fall Restrictions Weight Bearing Restrictions: No     Mobility  Bed Mobility Overal bed mobility: Needs Assistance Bed Mobility: Supine to Sit     Supine to sit: Contact guard     General bed mobility comments: increased time and cues Patient  Response: Cooperative  Transfers Overall transfer level: Needs assistance Equipment used: Rolling walker (2 wheels) Transfers: Sit to/from Stand Sit to Stand: Contact guard assist           General transfer comment: some increased assist from low bed but does well from chair with proper hand placements    Ambulation/Gait Ambulation/Gait assistance: Contact guard assist Gait Distance (Feet): 120 Feet Assistive device: Rolling walker (2 wheels) Gait Pattern/deviations: Step-through pattern, Decreased step length - right, Decreased step length - left, Staggering left, Staggering right Gait velocity: decresed     General Gait Details: overall improving but increased sway side to side and back with gait increasing fall risk   Stairs             Wheelchair Mobility     Tilt Bed Tilt Bed Patient Response: Cooperative  Modified Rankin (Stroke Patients Only)       Balance Overall balance assessment: Needs assistance Sitting-balance support: Feet supported, Bilateral upper extremity supported Sitting balance-Leahy Scale: Good     Standing balance support: During functional activity, Bilateral upper extremity supported Standing balance-Leahy Scale: Fair                              Cognition Arousal: Alert Behavior During Therapy: WFL for tasks assessed/performed Overall Cognitive Status: Within Functional Limits for tasks assessed  Exercises      General Comments        Pertinent Vitals/Pain Pain Assessment Pain Assessment: No/denies pain Pain Location: some soreness R elbow from pressure.  RN aware and positioned on pillow Pain Descriptors / Indicators: Sore Pain Intervention(s): Monitored during session, Limited activity within patient's tolerance, Repositioned    Home Living                          Prior Function            PT Goals (current goals can now be  found in the care plan section) Progress towards PT goals: Progressing toward goals    Frequency    Min 1X/week      PT Plan      Co-evaluation              AM-PAC PT "6 Clicks" Mobility   Outcome Measure  Help needed turning from your back to your side while in a flat bed without using bedrails?: A Little Help needed moving from lying on your back to sitting on the side of a flat bed without using bedrails?: A Little Help needed moving to and from a bed to a chair (including a wheelchair)?: A Little Help needed standing up from a chair using your arms (e.g., wheelchair or bedside chair)?: A Little Help needed to walk in hospital room?: A Little Help needed climbing 3-5 steps with a railing? : A Little 6 Click Score: 18    End of Session Equipment Utilized During Treatment: Gait belt Activity Tolerance: Patient tolerated treatment well;Patient limited by lethargy Patient left: in chair;with family/visitor present;with call bell/phone within reach;with chair alarm set Nurse Communication: Mobility status PT Visit Diagnosis: Other abnormalities of gait and mobility (R26.89);Difficulty in walking, not elsewhere classified (R26.2);Unsteadiness on feet (R26.81)     Time: 1308-6578 PT Time Calculation (min) (ACUTE ONLY): 14 min  Charges:    $Gait Training: 8-22 mins PT General Charges $$ ACUTE PT VISIT: 1 Visit                   Danielle Dess, PTA 02/19/23, 12:40 PM

## 2023-02-19 NOTE — Progress Notes (Signed)
Occupational Therapy Treatment Patient Details Name: Ryan Allison MRN: [id number] DOB: 07-13-1940 Today's Date: 02/19/2023   History of present illness Ryan Allison is a 82 y.o. 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   OT comments  Pt is supine in bed on arrival. Easily arousable and agreeable to OT session. He denies pain. Pt performed supine to sit at EOB with CGA. Set up for basin bath seated at EOB. He required set up assist for UB bathing/dressing tasks and Min A for LB bathing of buttocks/peri-area while in standing at RW with CGA. He needed Min/CGA for STS from low surface of bed, took lateral side steps to [facility] using RW with CGA. Intermittent rest breaks needed due to pt's fatigue and weakness. Educated on importance of working with therapy to get OOB and walk when he can due his high PLOF, he and neighbor in room verbalized understanding. He returned to supine with SUP with all needs in place and will cont to require skilled acute OT services to maximize his safety and IND to return to PLOF.       If plan is discharge home, recommend the following:  Supervision due to cognitive status;Direct supervision/assist for medications management;Direct supervision/assist for financial management;A little help with bathing/dressing/bathroom;A little help with walking and/or transfers   Equipment Recommendations  None recommended by OT    Recommendations for Other Services      Precautions / Restrictions Precautions Precautions: Fall Restrictions Weight Bearing Restrictions: No       Mobility Bed Mobility Overal bed mobility: Needs Assistance Bed Mobility: Supine to Sit, Sit to  Supine     Supine to sit: Contact guard Sit to supine: Supervision   General bed mobility comments: increased time and cues    Transfers Overall transfer level: Needs assistance Equipment used: Rolling walker (2 wheels) Transfers: Sit to/from Stand Sit to Stand: Contact guard assist, Min assist           General transfer comment: Min/CGA for low bed to RW     Balance Overall balance assessment: Needs assistance Sitting-balance support: Feet supported, Bilateral upper extremity supported Sitting balance-Leahy Scale: Good     Standing balance support: During functional activity, Bilateral upper extremity supported Standing balance-Leahy Scale: Fair                             ADL either performed or assessed with clinical judgement   ADL Overall ADL's : Needs assistance/impaired     Grooming: Wash/dry face;Set up;Sitting   Upper Body Bathing: Set up;Sitting   Lower Body Bathing: Minimal assistance;Sit to/from stand   Upper Body Dressing : Set up;Sitting                     General ADL Comments: pt sat at EOB for bathing/dressing with 1 STS from EOB to RW with Min/CGA then SBA/CGA in standing to take lateral side steps to [facility]. Min A needed for bathing of peri-area/buttocks in standing    Extremity/Trunk Assessment Upper Extremity Assessment Upper Extremity Assessment: Overall WFL for tasks assessed            Vision  Perception     Praxis      Cognition Arousal: Alert Behavior During Therapy: WFL for tasks assessed/performed Overall Cognitive Status: Within Functional Limits for tasks assessed                                          Exercises      Shoulder Instructions       General Comments      Pertinent Vitals/ Pain       Pain Assessment Pain Assessment: No/denies pain  Home Living                                          Prior Functioning/Environment               Frequency  Min 1X/week        Progress Toward Goals  OT Goals(current goals can now be found in the care plan section)  Progress towards OT goals: Progressing toward goals  Acute Rehab OT Goals Patient Stated Goal: to return home OT Goal Formulation: With patient Time For Goal Achievement: 03/02/23 Potential to Achieve Goals: Good  Plan      Co-evaluation                 AM-PAC OT "6 Clicks" Daily Activity     Outcome Measure     Help from another person taking care of personal grooming?: A Little Help from another person toileting, which includes using toliet, bedpan, or urinal?: A Little Help from another person bathing (including washing, rinsing, drying)?: A Lot Help from another person to put on and taking off regular upper body clothing?: A Little Help from another person to put on and taking off regular lower body clothing?: A Little 6 Click Score: 14    End of Session Equipment Utilized During Treatment: Rolling walker (2 wheels)  OT Visit Diagnosis: Unsteadiness on feet (R26.81);Other abnormalities of gait and mobility (R26.89)   Activity Tolerance Patient tolerated treatment well   Patient Left in bed;with call bell/phone within reach;with bed alarm set;with family/visitor present   Nurse Communication Mobility status        Time: 4034-7425 OT Time Calculation (min): 38 min  Charges: OT General Charges $OT Visit: 1 Visit OT Treatments $Self Care/Home Management : 38-52 mins  Ryan Allison, OTR/L  02/19/23, 1:06 PM   Marvina Danner E Deaysia Grigoryan 02/19/2023, 1:03 PM

## 2023-02-19 NOTE — Care Management Important Message (Signed)
Important Message  Patient Details  Name: Ryan Allison MRN: [id number] Date of Birth: 1941-03-08   Important Message Given:  Yes - Medicare IM     Bernadette Hoit 02/19/2023, 3:19 PM

## 2023-02-19 NOTE — NC FL2 (Signed)
[place] MEDICAID FL2 LEVEL OF CARE FORM     IDENTIFICATION  Patient Name: Ryan Allison Birthdate: [birth year] Sex: male Admission Date (Current Location): 02/14/2023  [facility] and [place] Number:  Chiropodist and Address:  [facility], [address], [place], [place] [address]      Provider Number: [id number]  Attending Physician Name and Address:  Pennie Banter, DO  Relative Name and Phone Number:       Current Level of Care: Hospital Recommended Level of Care: Skilled Nursing Facility Prior Approval Number:    Date Approved/Denied:   PASRR Number: [id number]  Discharge Plan: SNF    Current Diagnoses: Patient Active Problem List   Diagnosis Date Noted   Hypoglycemia 02/18/2023   Pontine hemorrhage (HCC) 02/16/2023   Hypomagnesemia 02/14/2023   Near syncope 02/14/2023   HTN (hypertension) 02/14/2023   ICH (intracerebral hemorrhage) (HCC) 02/14/2023   Cervical spondylosis with myelopathy 06/21/2022   Hemorrhage of colon due to diverticulosis 06/21/2022   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/2022   Post-traumatic stress disorder, chronic 06/21/2022   Mild protein-calorie malnutrition (HCC) 05/30/2022   Gross hematuria 03/19/2019   Nephrolithiasis 03/19/2019   Atrial flutter (HCC) 09/30/2018   OSA on CPAP 09/30/2018   Acute blood loss anemia 09/30/2018   Encounter for anticoagulation discussion and counseling 09/30/2018   Small bowel obstruction (HCC) 08/05/2018   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) 08/02/2018   Paroxysmal atrial fibrillation (HCC) 06/19/2018   Hematochezia    Internal hemorrhoids    Ulcer of anus and rectum    Diverticulosis of large intestine without diverticulitis    B12 deficiency 01/30/2017   Diplopia 01/30/2017   Diverticulosis 01/30/2017   Hyperlipidemia 01/30/2017   Kyphoscoliosis 01/30/2017   Malignant neoplasm of prostate (HCC) 01/30/2017   Osteopenia 01/30/2017   Peripheral  polyneuropathy 01/30/2017   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   Status post eye surgery 07/17/2016   Acute gastrointestinal hemorrhage 05/06/2016   Cancer of prostate (HCC) 04/03/2016   Aortic atherosclerosis (HCC) 10/21/2015   DDD (degenerative disc disease), lumbar 10/21/2015   GERD (gastroesophageal reflux disease) 01/20/2014   Esotropia 03/12/2012   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   Myopia of both eyes 03/12/2012   ED (erectile dysfunction) of organic origin 02/19/2012   Amblyopia of right eye 02/06/2012   Senile nuclear sclerosis 02/06/2012    Orientation RESPIRATION BLADDER Height & Weight     Time, Self, Situation, Place    Continent Weight: 155 lb 6.8 oz (70.5 kg) Height:  5\' 10"  (177.8 cm)  BEHAVIORAL SYMPTOMS/MOOD NEUROLOGICAL BOWEL NUTRITION STATUS      Ileostomy    AMBULATORY STATUS COMMUNICATION OF NEEDS Skin   Limited Assist   Normal                       Personal Care Assistance Level of Assistance  Bathing, Feeding, Dressing Bathing Assistance: Limited assistance Feeding assistance: Limited assistance Dressing Assistance: Limited assistance     Functional Limitations Info  Sight, Hearing, Speech Sight Info: Impaired Hearing Info: Adequate Speech Info: Adequate    SPECIAL CARE FACTORS FREQUENCY  PT (By licensed PT), OT (By licensed OT)     PT Frequency: 5 times a week OT Frequency: 5 times a week            Contractures Contractures Info: Not present    Additional Factors Info  Code Status, Allergies Code Status Info:  FULL             Current Medications (02/19/2023):  This is the current hospital active medication list Current Facility-Administered Medications  Medication Dose Route Frequency Provider Last Rate Last Admin    stroke: early stages of recovery book   Does not apply Once Rust-Chester, Cecelia Byars, NP       acetaminophen (TYLENOL) tablet 650 mg  650 mg Oral Q4H PRN  Rust-Chester, Cecelia Byars, NP   650 mg at 02/16/23 2044   Or   acetaminophen (TYLENOL) 160 MG/5ML solution 650 mg  650 mg Per Tube Q4H PRN Rust-Chester, Cecelia Byars, NP       Or   acetaminophen (TYLENOL) suppository 650 mg  650 mg Rectal Q4H PRN Rust-Chester, Micheline Rough L, NP       feeding supplement (ENSURE ENLIVE / ENSURE PLUS) liquid 237 mL  237 mL Oral TID BM Aleskerov, Fuad, MD   237 mL at 02/19/23 0945   hydrALAZINE (APRESOLINE) injection 10-20 mg  10-20 mg Intravenous Q4H PRN Rust-Chester, Britton L, NP   20 mg at 02/16/23 0308   metoprolol tartrate (LOPRESSOR) tablet 12.5 mg  12.5 mg Oral BID Raechel Chute, MD   12.5 mg at 02/19/23 0945   multivitamin with minerals tablet 1 tablet  1 tablet Oral Daily Vida Rigger, MD   1 tablet at 02/19/23 0945   ondansetron (ZOFRAN) injection 4 mg  4 mg Intravenous Q8H PRN Lorretta Harp, MD       Oral care mouth rinse  15 mL Mouth Rinse PRN Vida Rigger, MD       senna-docusate (Senokot-S) tablet 1 tablet  1 tablet Oral BID PRN Mila Merry A, RPH       simvastatin (ZOCOR) tablet 20 mg  20 mg Oral QHS Rust-Chester, Britton L, NP   20 mg at 02/18/23 2142     Discharge Medications: Please see discharge summary for a list of discharge medications.  Relevant Imaging Results:  Relevant Lab Results:   Additional Information [id number]  Allena Katz, LCSW

## 2023-02-19 NOTE — TOC Progression Note (Signed)
Transition of Care [facility]) - Progression Note    Patient Details  Name: Ryan Allison MRN: [id number] Date of Birth: 1940/07/24  [facility]) CM/SW Contact  Allena Katz, LCSW Phone Number: 02/19/2023, 12:09 PM  Clinical Narrative:   CIR unable to take due to family unable to stay after discharge. Family would like SNF around area referrals sent out.          Expected Discharge Plan and Services                                               Social Determinants of Health (SDOH) Interventions SDOH Screenings   Food Insecurity: Patient Unable To Answer (02/16/2023)  Housing: Patient Unable To Answer (02/16/2023)  Transportation Needs: Patient Unable To Answer (02/16/2023)  Utilities: Patient Unable To Answer (02/16/2023)  Depression (PHQ2-9): Medium Risk (02/23/2020)  Financial Resource Strain: Low Risk  (01/11/2023)   Received from [facility]  Tobacco Use: Medium Risk (02/14/2023)   Received from [facility]    Readmission Risk Interventions     No data to display

## 2023-02-20 DIAGNOSIS — I613 Nontraumatic intracerebral hemorrhage in brain stem: Secondary | ICD-10-CM | POA: Diagnosis not present

## 2023-02-20 LAB — BASIC METABOLIC PANEL
Anion gap: 11 (ref 5–15)
BUN: 50 mg/dL — ABNORMAL HIGH (ref 8–23)
CO2: 24 mmol/L (ref 22–32)
Calcium: 9.5 mg/dL (ref 8.9–10.3)
Chloride: 100 mmol/L (ref 98–111)
Creatinine, Ser: 1.09 mg/dL (ref 0.61–1.24)
GFR, Estimated: >60 mL/min (ref >60)
Glucose, Bld: 106 mg/dL — ABNORMAL HIGH (ref 70–99)
Potassium: 4.5 mmol/L (ref 3.5–5.1)
Sodium: 135 mmol/L (ref 135–145)

## 2023-02-20 LAB — CBC
HCT: 41.4 % (ref 39.0–52.0)
Hemoglobin: 14.6 g/dL (ref 13.0–17.0)
MCH: 33.2 pg (ref 26.0–34.0)
MCHC: 35.3 g/dL (ref 30.0–36.0)
MCV: 94.1 fL (ref 80.0–100.0)
Platelets: 165 10*3/uL (ref 150–400)
RBC: 4.4 MIL/uL (ref 4.22–5.81)
RDW: 12.5 % (ref 11.5–15.5)
WBC: 8.9 10*3/uL (ref 4.0–10.5)
nRBC: 0 % (ref 0.0–0.2)

## 2023-02-20 LAB — MAGNESIUM: Magnesium: 2.1 mg/dL (ref 1.7–2.4)

## 2023-02-20 LAB — GLUCOSE, CAPILLARY
Glucose-Capillary: 105 mg/dL — ABNORMAL HIGH (ref 70–99)
Glucose-Capillary: 131 mg/dL — ABNORMAL HIGH (ref 70–99)
Glucose-Capillary: 123 mg/dL — ABNORMAL HIGH (ref 70–99)

## 2023-02-20 LAB — PHOSPHORUS: Phosphorus: 4.1 mg/dL (ref 2.5–4.6)

## 2023-02-20 MED ORDER — PANTOPRAZOLE SODIUM 40 MG PO TBEC
40.0000 mg | DELAYED_RELEASE_TABLET | Freq: Every day | ORAL | Status: DC
  Administered 2023-02-20 – 2023-02-23 (×4): 40 mg via ORAL
  Filled 2023-02-20 (×4): qty 1

## 2023-02-20 NOTE — Progress Notes (Signed)
Occupational Therapy Treatment Patient Details Name: Ryan Allison MRN: [id number] DOB: 31-May-1940 Today's Date: 02/20/2023   History of present illness [name] is a 82 y.o. 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   OT comments  Pt is supine in bed on arrival with friend present. Easily arousable and agreeable to OT session with encouragement. He denies pain. Pt performed bed mobility with SUP/SBA and extra time using bed rails. He performed STS from EOB to RW with CGA from low bed surface and stood at sink x8 mins to perform oral hygiene with RW in place and no UE support to maintain balance with SUP. He ambulated using RW to the bathroom with CGA and performed dry shower transfer using grab bar and RW with Min A and extra time, verb cues for safety.  Pt returned to bed and was fatigued at end of session as he continues to have increased weakness and low activity tolerance. Answered all questions from pt and friend in room regarding therapy needs for [facility] versus rehab. Pt left with all needs in place and will cont to require skilled acute OT services to maximize his safety and IND to return to PLOF.       If plan is discharge home, recommend the following:  Supervision due to cognitive status;Direct supervision/assist for medications management;Direct supervision/assist for financial management;A little help with bathing/dressing/bathroom;A little help with walking and/or transfers;Assist for transportation;Help with stairs or ramp for entrance   Equipment Recommendations  None recommended by OT    Recommendations for Other Services      Precautions / Restrictions Restrictions Weight  Bearing Restrictions: No       Mobility Bed Mobility   Bed Mobility: Supine to Sit, Sit to Supine     Supine to sit: Supervision Sit to supine: Supervision   General bed mobility comments: increased time and cues    Transfers Overall transfer level: Needs assistance Equipment used: Rolling walker (2 wheels) Transfers: Sit to/from Stand Sit to Stand: Contact guard assist           General transfer comment: CGA for STS from low bed to RW     Balance Overall balance assessment: Needs assistance Sitting-balance support: Feet supported Sitting balance-Leahy Scale: Good     Standing balance support: During functional activity, No upper extremity supported Standing balance-Leahy Scale: Good Standing balance comment: dynamic standing at sink for oral hygiene and during shower transfer                           ADL either performed or assessed with clinical judgement   ADL Overall ADL's : Needs assistance/impaired     Grooming: Oral care;Standing;Supervision/safety                           Tub/ Shower Transfer: Minimal assistance;Grab bars;Rolling walker (2 wheels);Ambulation;Cueing for safety   Functional mobility during ADLs: Contact guard assist General ADL Comments: Pt stood at sink x8 mins to perform oral hygiene with RW in place and no UE support to maintain balance with SUP.    Extremity/Trunk Assessment Upper Extremity Assessment Upper Extremity Assessment: Overall WFL for tasks assessed  Vision       Perception     Praxis      Cognition Arousal: Alert Behavior During Therapy: WFL for tasks assessed/performed Overall Cognitive Status: Within Functional Limits for tasks assessed                                          Exercises      Shoulder Instructions       General Comments pt remains very fatigued/tired limiting his desire to participate, although he continues to participate well  with encouragement    Pertinent Vitals/ Pain       Pain Assessment Pain Assessment: No/denies pain  Home Living                                          Prior Functioning/Environment              Frequency  Min 1X/week        Progress Toward Goals  OT Goals(current goals can now be found in the care plan section)  Progress towards OT goals: Progressing toward goals  Acute Rehab OT Goals Patient Stated Goal: to return home and get stronger OT Goal Formulation: With patient Time For Goal Achievement: 03/02/23 Potential to Achieve Goals: Good  Plan      Co-evaluation                 AM-PAC OT "6 Clicks" Daily Activity     Outcome Measure   Help from another person eating meals?: None Help from another person taking care of personal grooming?: None Help from another person toileting, which includes using toliet, bedpan, or urinal?: A Little Help from another person bathing (including washing, rinsing, drying)?: A Lot Help from another person to put on and taking off regular upper body clothing?: A Little Help from another person to put on and taking off regular lower body clothing?: A Little 6 Click Score: 19    End of Session Equipment Utilized During Treatment: Rolling walker (2 wheels)  OT Visit Diagnosis: Unsteadiness on feet (R26.81);Other abnormalities of gait and mobility (R26.89)   Activity Tolerance Patient tolerated treatment well   Patient Left in bed;with call bell/phone within reach;with bed alarm set;with family/visitor present   Nurse Communication Mobility status        Time: 8413-2440 OT Time Calculation (min): 44 min  Charges: OT General Charges $OT Visit: 1 Visit OT Treatments $Self Care/Home Management : 38-52 mins  Ryan Allison, OTR/L  02/20/23, 12:20 PM   Ryan Allison 02/20/2023, 12:17 PM

## 2023-02-20 NOTE — Progress Notes (Signed)
Inpatient Rehab Admissions Coordinator:    I spoke with Pt.'s son, who states he is comfortable with Pt. Discharging home with partner [name]. I will pursue for CIR admit pending bed availability.  Megan Salon, MS, CCC-SLP Rehab Admissions Coordinator  [phone] (celll) [phone] (office)

## 2023-02-20 NOTE — Progress Notes (Addendum)
Physical Therapy Treatment Patient Details Name: Ryan Allison MRN: [id number] DOB: 1940-06-18 Today's Date: 02/20/2023   History of Present Illness [name] is a 82 y.o. 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    PT Comments  Pt in bed, ready to work.  Questions and explained transition to SNF.  He does need some increased assist with min a x 1.  Steady in sitting.  Min a to stand and is able to progress to x 2 laps today.  Upon return to room remains standing for BLE standing ex with RW support.  Overall increase in activity tolerance and mobility today.  SNF is an appropriate transition for discharge at this point.  He remains motivated to participate and return home.  DC plan was changed to SNF yesterday as CIR was declined.  Per CIR note today CIR may be an option.  If it is available to him he would benefit from the opportunity.   If plan is discharge home, recommend the following: Help with stairs or ramp for entrance;Assist for transportation;A little help with walking and/or transfers;A little help with bathing/dressing/bathroom;Supervision due to cognitive status;Direct supervision/assist for financial management;Direct supervision/assist for medications management;Assistance with cooking/housework   Can travel by private vehicle        Equipment Recommendations       Recommendations for Other Services       Precautions / Restrictions Precautions Precautions: Fall Restrictions Weight Bearing Restrictions: No     Mobility  Bed Mobility Overal bed mobility: Needs Assistance Bed Mobility: Supine to Sit     Supine to sit: Min assist       Patient Response:  Cooperative  Transfers Overall transfer level: Needs assistance Equipment used: Rolling walker (2 wheels) Transfers: Sit to/from Stand Sit to Stand: Contact guard assist           General transfer comment: cues for hand placement    Ambulation/Gait Ambulation/Gait assistance: Contact guard assist Gait Distance (Feet): 360 Feet Assistive device: Rolling walker (2 wheels) Gait Pattern/deviations: Step-through pattern, Decreased step length - right, Decreased step length - left, Staggering left, Staggering right Gait velocity: decresed         Stairs             Wheelchair Mobility     Tilt Bed Tilt Bed Patient Response: Cooperative  Modified Rankin (Stroke Patients Only)       Balance Overall balance assessment: Needs assistance Sitting-balance support: Feet supported Sitting balance-Leahy Scale: Good     Standing balance support: During functional activity, No upper extremity supported Standing balance-Leahy Scale: Good                              Cognition Arousal: Alert Behavior During Therapy: WFL for tasks assessed/performed Overall Cognitive Status: Within Functional Limits for tasks assessed                                          Exercises      General Comments General comments (skin integrity, edema, etc.): pt remains very fatigued/tired limiting his  desire to participate, although he continues to participate well with encouragement      Pertinent Vitals/Pain Pain Assessment Pain Assessment: No/denies pain    Home Living                          Prior Function            PT Goals (current goals can now be found in the care plan section) Progress towards PT goals: Progressing toward goals    Frequency    Min 1X/week      PT Plan      Co-evaluation              AM-PAC PT "6 Clicks" Mobility   Outcome Measure  Help needed turning from your back to your side while in a  flat bed without using bedrails?: A Little Help needed moving from lying on your back to sitting on the side of a flat bed without using bedrails?: A Little Help needed moving to and from a bed to a chair (including a wheelchair)?: A Little Help needed standing up from a chair using your arms (e.g., wheelchair or bedside chair)?: A Little Help needed to walk in hospital room?: A Little Help needed climbing 3-5 steps with a railing? : A Little 6 Click Score: 18    End of Session Equipment Utilized During Treatment: Gait belt Activity Tolerance: Patient tolerated treatment well Patient left: in chair;with family/visitor present;with call bell/phone within reach;with chair alarm set Nurse Communication: Mobility status PT Visit Diagnosis: Other abnormalities of gait and mobility (R26.89);Difficulty in walking, not elsewhere classified (R26.2);Unsteadiness on feet (R26.81)     Time: 7564-3329 PT Time Calculation (min) (ACUTE ONLY): 29 min  Charges:    $Gait Training: 23-37 mins PT General Charges $$ ACUTE PT VISIT: 1 Visit                   Danielle Dess, PTA 02/20/23, 12:43 PM

## 2023-02-20 NOTE — Progress Notes (Signed)
Progress Note   Patient: Ryan Allison ZOX:[id number] DOB: 02-Nov-1940 DOA: 02/14/2023     6 DOS: the patient was seen and examined on 02/20/2023   Brief hospital course: 82 yo M with PMHx of A-flutter on Eliquis, diverticular bleed, prostate cancer s/p RT, OSA (not on CPAP), Anxiety, HTN, HLD, prior tobacco use who presented to [facility] ED on 02/14/2023 for evaluation of near syncopal episodes with diaphoresis, dizziness with weakness and dry heaving.  Initially admitted to ICU.  TRH service assumed care 02/17/2023.  HPI on admission & significant events: "History obtained per chart review and bedside patient report. Patient reports being in his normal state of health until early in the morning on 10/2. He was at home in bed when he began to feel extremely weak with some tremors in his Right leg and sat down. He then describes breaking out in a sweat and too weak to get off the ground. This lasted for 30 minutes-1 hour. He ultimately was able to get himself back up and into bed and go back to sleep. He had another recurrence of symptoms when he got out of bed for the day that included nausea without vomiting. He does endorse feeling tired and lightheaded as well as a headache. He denied pain, blurred vision, shortness of breath, chest pain, no overt palpitations.  Of note patient on a reduced dose of Eliquis at 2.5 mg BID due to history of diverticular bleeding and hematuria. Patient has declined Watchman device/ cardioversion previously.   ED course: Upon arrival patient alert and oriented with stable vitals in Atrial flutter. Labs reassuring with the exception of hypomagnesemia. Imaging revealed small pontine ICH. NSUR alerted, patient not a candidate for surgical intervention at this time, recommending Neurology consultation. Neurology recommended admission to ICU with strict BP control 130-150 and follow up imaging to monitor ICH overnight. Eliquis reversed."  Admitted to ICU with  Small pontine ICH,  started on cleviprex drip for HTN.   Further hospital course and management as outlined below.   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   --Neurology consulted --Strict BP control with SBP < 150 --Hold off anticoagulation or antithrombotics until follow up --PT/OT/SLP following --CIR recommended - pending   Near syncope Mostly likely due to his ICH Continue PT/OT Fall precautions  Paroxysmal atrial fibrillation (HCC) A-flutter --  HR's controlled Holding Eliquis given ICH Continue metoprolol Telemetry monitoring  Hypomagnesemia Replaced on 10/5 for Mg 1.5 --Maintain Mg > 2 given A-fib/flutter  Hyperlipidemia On Zocor  Hypoglycemia RESOLVED. Was on D10w, now off and no hypoglycemic episodes. --Encourage PO intake --Offer juices, snacks between meals --Monitor CBG's --Hypoglycemia protocol  HTN (hypertension) On metoprolol 12.5 mg BID PRN IV hydralazine if SBP>150 Per Neurology, strict BP control to keep SBP < 150 Titrate regimen as needed  Elevated BUN BUN increased from normal 22 >> 37 >> 50 today.  Hbg 13.8 >> 14.6 so unlikely upper GI bleed.  No signs of bleeding and pt denies abdominal pain. --Repeat CBC & BMP in AM --Monitor stools for melena --Will start empiric daily Protonix for now  ICH (intracerebral hemorrhage) (HCC) See Pontine hemorrhage        Subjective: Pt seen standing up at sink with OT this AM, Rosalie at bedside.  He remains very eager to get to  rehab and does not understand the reasoning CIR declined.  States he wants the very best rehab possible.  Rosalie and pt state she is able to assist him at home once discharged from rehab.   Pt denies other complaints.  States "please get me out of here as soon as possible".   Physical  Exam: Vitals:   02/19/23 1926 02/20/23 0317 02/20/23 0355 02/20/23 0737  BP: 127/65 135/71  131/71  Pulse: 71 72  72  Resp: 17 18  17   Temp: 98.9 F (37.2 C) 98.6 F (37 C)  98.4 F (36.9 C)  TempSrc: Oral Oral    SpO2: 95% 100%  99%  Weight:   67 kg   Height:       General exam: awake,alert, talkative, no acute distress HEENT: moist mucus membranes, hearing grossly normal  Respiratory system: on room air, normal respiratory effort. Cardiovascular system: normal S1/S2, RRR, no pedal edema.   Gastrointestinal system: soft, NT, ND Central nervous system: A&O x2+. no gross focal neurologic deficits, normal speech Skin: dry, intact, normal temperature Psychiatry: normal mood, congruent affect   Data Reviewed:  Notable labs ---   Glucose 106  BUN rising 22 >> 37 >> 50  Normal CBC Hbg 13.8 >> 14.6 (unlikely upper GI bleed to explain rising BUN)  CBG's 126 >> 131 >> 82 >> 105 >> 131   Family Communication: Rosalie friend & neighbor (sig other) at bedside  Disposition: Status is: Inpatient Remains inpatient appropriate because: awaiting CIR placement / authorization   Planned Discharge Destination:  CIR    Time spent: 35 minutes  Author: Pennie Banter, DO 02/20/2023 1:25 PM  For on call review [email or web address].

## 2023-02-20 NOTE — Progress Notes (Signed)
Inpatient Rehab Admissions Coordinator:    I received contact from [facility] that Pt. Wants to be re-assessed for CIR candidacy. I spoke with him and partner, [name] and [name] states that she can provide 24/7 care if needed. The issue with this plan is that Pt.'s son stated yesterday that [name] has some dementia and would not be able to care for Pt. I have called pt.'s son [name] and left VM to discuss.   Megan Salon, MS, CCC-SLP Rehab Admissions Coordinator  [phone] (celll) [phone] (office)

## 2023-02-20 NOTE — Plan of Care (Signed)
  Problem: Education: Goal: Knowledge of disease or condition will improve Outcome: Progressing Goal: Knowledge of secondary prevention will improve (MUST DOCUMENT ALL) Outcome: Progressing Goal: Knowledge of patient specific risk factors will improve (Mark N/A or DELETE if not current risk factor) Outcome: Progressing   Problem: Intracerebral Hemorrhage Tissue Perfusion: Goal: Complications of Intracerebral Hemorrhage will be minimized Outcome: Progressing   Problem: Coping: Goal: Will verbalize positive feelings about self Outcome: Progressing Goal: Will identify appropriate support needs Outcome: Progressing   Problem: Health Behavior/Discharge Planning: Goal: Ability to manage health-related needs will improve Outcome: Progressing Goal: Goals will be collaboratively established with patient/family Outcome: Progressing   Problem: Self-Care: Goal: Ability to participate in self-care as condition permits will improve Outcome: Progressing Goal: Verbalization of feelings and concerns over difficulty with self-care will improve Outcome: Progressing Goal: Ability to communicate needs accurately will improve Outcome: Progressing   Problem: Nutrition: Goal: Risk of aspiration will decrease Outcome: Progressing Goal: Dietary intake will improve Outcome: Progressing   Problem: Education: Goal: Knowledge of General Education information will improve Description: Including pain rating scale, medication(s)/side effects and non-pharmacologic comfort measures Outcome: Progressing   Problem: Health Behavior/Discharge Planning: Goal: Ability to manage health-related needs will improve Outcome: Progressing   Problem: Clinical Measurements: Goal: Ability to maintain clinical measurements within normal limits will improve Outcome: Progressing Goal: Will remain free from infection Outcome: Progressing Goal: Diagnostic test results will improve Outcome: Progressing Goal:  Respiratory complications will improve Outcome: Progressing Goal: Cardiovascular complication will be avoided Outcome: Progressing   Problem: Activity: Goal: Risk for activity intolerance will decrease Outcome: Progressing   Problem: Nutrition: Goal: Adequate nutrition will be maintained Outcome: Progressing   Problem: Coping: Goal: Level of anxiety will decrease Outcome: Progressing   Problem: Elimination: Goal: Will not experience complications related to bowel motility Outcome: Progressing Goal: Will not experience complications related to urinary retention Outcome: Progressing   Problem: Pain Managment: Goal: General experience of comfort will improve Outcome: Progressing   Problem: Safety: Goal: Ability to remain free from injury will improve Outcome: Progressing   Problem: Skin Integrity: Goal: Risk for impaired skin integrity will decrease Outcome: Progressing   

## 2023-02-20 NOTE — TOC Progression Note (Signed)
Transition of Care [facility]) - Progression Note    Patient Details  Name: Ryan Allison MRN: [id number] Date of Birth: 1940/05/21  Transition of [facility]) CM/SW Contact  Allena Katz, LCSW Phone Number: 02/20/2023, 3:23 PM  Clinical Narrative:   Pt has been accepted to [facility] re[facility] pending a bed. Pt has been placed on waiting list.          Expected Discharge Plan and Services                                               Social Determinants of Health (SDOH) Interventions SDOH Screenings   Food Insecurity: Patient Unable To Answer (02/16/2023)  Housing: Patient Unable To Answer (02/16/2023)  Transportation Needs: Patient Unable To Answer (02/16/2023)  Utilities: Patient Unable To Answer (02/16/2023)  Depression (PHQ2-9): Medium Risk (02/23/2020)  Financial Resource Strain: Low Risk  (01/11/2023)   Received from [facility]  Tobacco Use: Medium Risk (02/19/2023)    Readmission Risk Interventions     No data to display

## 2023-02-21 DIAGNOSIS — R55 Syncope and collapse: Secondary | ICD-10-CM | POA: Diagnosis not present

## 2023-02-21 DIAGNOSIS — I4892 Unspecified atrial flutter: Secondary | ICD-10-CM | POA: Diagnosis not present

## 2023-02-21 DIAGNOSIS — I613 Nontraumatic intracerebral hemorrhage in brain stem: Secondary | ICD-10-CM | POA: Diagnosis not present

## 2023-02-21 LAB — BASIC METABOLIC PANEL
Anion gap: 10 (ref 5–15)
BUN: 44 mg/dL — ABNORMAL HIGH (ref 8–23)
CO2: 26 mmol/L (ref 22–32)
Calcium: 9.4 mg/dL (ref 8.9–10.3)
Chloride: 99 mmol/L (ref 98–111)
Creatinine, Ser: 1.04 mg/dL (ref 0.61–1.24)
GFR, Estimated: >60 mL/min (ref >60)
Glucose, Bld: 105 mg/dL — ABNORMAL HIGH (ref 70–99)
Potassium: 5.1 mmol/L (ref 3.5–5.1)
Sodium: 135 mmol/L (ref 135–145)

## 2023-02-21 LAB — CBC
HCT: 41.7 % (ref 39.0–52.0)
Hemoglobin: 14.6 g/dL (ref 13.0–17.0)
MCH: 33.7 pg (ref 26.0–34.0)
MCHC: 35 g/dL (ref 30.0–36.0)
MCV: 96.3 fL (ref 80.0–100.0)
Platelets: 170 10*3/uL (ref 150–400)
RBC: 4.33 MIL/uL (ref 4.22–5.81)
RDW: 12.8 % (ref 11.5–15.5)
WBC: 8.6 10*3/uL (ref 4.0–10.5)
nRBC: 0 % (ref 0.0–0.2)

## 2023-02-21 LAB — GLUCOSE, CAPILLARY
Glucose-Capillary: 97 mg/dL (ref 70–99)
Glucose-Capillary: 102 mg/dL — ABNORMAL HIGH (ref 70–99)
Glucose-Capillary: 98 mg/dL (ref 70–99)
Glucose-Capillary: 116 mg/dL — ABNORMAL HIGH (ref 70–99)

## 2023-02-21 MED ORDER — POLYETHYLENE GLYCOL 3350 17 G PO PACK: 17.0000 g | PACK | Freq: Every day | ORAL | Status: DC | PRN

## 2023-02-21 NOTE — Progress Notes (Signed)
Speech Language Pathology Treatment: Cognitive-Linquistic  Patient Details Name: Ryan Allison MRN: [id number] DOB: 1940/06/14 Today's Date: 02/21/2023 Time: 4098-1191 SLP Time Calculation (min) (ACUTE ONLY): 15 min  Assessment / Plan / Recommendation Clinical Impression  Pt seen for skilled SLP services targeting functional cognitive-linguistic ability. Pt received upright in recliner. Pt noted just finishing with PTA. Pt endorsed feeling "tired" and thought he would "not do well with silly questions" (referring to cognitive-linguistic evaluation completed previous). Functional recall targeted during today's session. Pt able to recall several specific details of tx session with PTA as well as items from breakfast tray. Friend at bedside able to confirm details. Pt continues with inconsistent recall of other details incorrectly stating that this writer told him he was not d/c'ing to rehab this date. Reinforced that it was not this Clinical research associate but, perhaps, another member of his care team who relayed that information to him. Pt notably tearful when discussing d/c planning, pt eager to d/c to CIR. SLP to f/u per POC for further cognitive-linguistic tx.    HPI HPI: Ryan Allison is a 82 y.o. 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  Recommendations may be updated based on patient status, additional  functional criteria and insurance authorization.    Recommendations                         Frequent or constant Supervision/Assistance Cognitive communication deficit (Y78.295)     Continue with current plan of care    Ryan Allison, M.S., CCC-SLP Speech-Language Pathologist [facility] [phone] Ryan Allison)  Ryan Allison  02/21/2023, 11:54 AM

## 2023-02-21 NOTE — Progress Notes (Signed)
Initial Nutrition Assessment  DOCUMENTATION CODES:   Non-severe (moderate) malnutrition in context of chronic illness  INTERVENTION:   -Continue Ensure Enlive po TID, each supplement provides 350 kcal and 20 grams of protein -MVI with minerals daily  NUTRITION DIAGNOSIS:   Moderate Malnutrition related to chronic illness (prostate cancer) as evidenced by mild fat depletion, moderate fat depletion, mild muscle depletion, moderate muscle depletion.  GOAL:   Patient will meet greater than or equal to 90% of their needs  MONITOR:   PO intake, Supplement acceptance  REASON FOR ASSESSMENT:   Malnutrition Screening Tool    ASSESSMENT:   Pt with PMHx of A-flutter on Eliquis, diverticular bleed, prostate cancer s/p RT, OSA (not on CPAP), Anxiety, HTN, HLD, prior tobacco use who presented for evaluation of near syncopal episodes with diaphoresis, dizziness with weakness and dry heaving.  Pt admitted with pontine hemorrhage.   Reviewed I/O's: -540 ml x 24 hours and -673 ml since admission  UOP: 500 ml x 24 hours  Ileostomy output: 500 ml x 24 hours   Spoke with pt, who was sitting in recliner chair at time of visit. Pt reports feeling better since admission. Pt reports intake has improved and has been consuming most of his meals. Noted meal completions 50-100%. Pt reports he has been eating all of his breakfast, but typically does not eat breakfast at home.   PTA pt reports fair appetite, consuming 2 meals per day (chicken and rice or spaghetti).   Per pt, he experienced a 60# wt loss a few years ago after back surgery at [facility]. He is concerned as his UBW is around 200# and lost down to 140#. Pt reports no recent wt loss, but has been unable to regain lost weight. Reviewed wt hx; pt has experienced a 3.7% wt loss over the past 6 months, which is not significant for time frame.   Discussed importance of good meal and supplement intake to promote healing. Pt amenable to Ensure, reports  he likes the strawberry favor.   Case discussed with SLP; pt is making good progress, but still has memory deficits, but is aware of them. Plan for CIR when bed available.   Medications reviewed.   Lab Results  Component Value Date   HGBA1C 5.5 06/06/2018   PTA DM medications are none.   Labs reviewed: CBGS: 82-131 (inpatient orders for glycemic control are none).    NUTRITION - FOCUSED PHYSICAL EXAM:  Flowsheet Row Most Recent Value  Orbital Region Moderate depletion  Upper Arm Region Moderate depletion  Thoracic and Lumbar Region No depletion  Buccal Region Mild depletion  Temple Region Mild depletion  Clavicle Bone Region Moderate depletion  Clavicle and Acromion Bone Region Moderate depletion  Scapular Bone Region Moderate depletion  Dorsal Hand Mild depletion  Patellar Region Mild depletion  Anterior Thigh Region Mild depletion  Posterior Calf Region Mild depletion  Edema (RD Assessment) None  Hair Reviewed  Eyes Reviewed  Mouth Reviewed  Skin Reviewed  Nails Reviewed       Diet Order:   Diet Order             Diet regular Room service appropriate? No; Fluid consistency: Thin  Diet effective now                   EDUCATION NEEDS:   Education needs have been addressed  Skin:  Skin Assessment: Reviewed RN Assessment  Last BM:  02/20/23 (150 ml via ileostomy)  Height:   Ht  Readings from Last 1 Encounters:  02/15/23 5\' 10"  (1.778 m)    Weight:   Wt Readings from Last 1 Encounters:  02/21/23 65.7 kg    Ideal Body Weight:  75.5 kg  BMI:  Body mass index is 20.78 kg/m.  Estimated Nutritional Needs:   Kcal:  1750-1950  Protein:  90-105 grams  Fluid:  > 1.7 L    Levada Schilling, RD, LDN, CDCES Registered Dietitian III Certified Diabetes Care and Education Specialist Please refer to [facility] for RD and/or RD on-call/weekend/after hours pager

## 2023-02-21 NOTE — Progress Notes (Signed)
Progress Note   Patient: Ryan Allison MWU:[id number] DOB: Sep 03, 1940 DOA: 02/14/2023     7 DOS: the patient was seen and examined on 02/21/2023   Brief hospital course: 82 yo M with PMHx of 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 Of note patient on a reduced dose of Eliquis at 2.5 mg BID due to history of diverticular bleeding and hematuria. Patient has declined Watchman device/ cardioversion previously.  They presented to [facility] ED on 02/14/2023 for evaluation of near syncopal episodes with diaphoresis, dizziness with weakness and dry heaving.  Initially admitted to ICU.  TRH service assumed care 02/17/2023.   ED course: Upon arrival patient alert and oriented with stable vitals in Atrial flutter. Labs reassuring with the exception of hypomagnesemia. Imaging revealed small pontine ICH. NSUR alerted, patient not a candidate for surgical intervention at this time, recommending Neurology consultation. Neurology recommended admission to ICU with strict BP control 130-150 and follow up imaging to monitor ICH overnight. Eliquis reversed. Started on cleviprex drip for HTN.    02/21/23- patient has remained stable since transfer to [facility] service. PT/OT evaluated and recommended acute rehab. He has been approved for CIR and is currently awaiting an available bed.   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   --Neurology consulted --Strict BP control with SBP < 150 --Hold off anticoagulation or antithrombotics until follow up --PT/OT/SLP following --CIR recommended - pending   Near syncope Mostly likely due to his ICH Continue PT/OT Fall precautions  Paroxysmal atrial fibrillation (HCC) A-flutter --  HR's  controlled Holding Eliquis given ICH Continue metoprolol Telemetry monitoring  Hypomagnesemia Replaced on 10/5 for Mg 1.5 --Maintain Mg > 2 given A-fib/flutter  Hyperlipidemia On Zocor  Hypoglycemia RESOLVED. Was on D10w, now off and no hypoglycemic episodes. --Encourage PO intake --Offer juices, snacks between meals --Monitor CBG's --Hypoglycemia protocol  HTN (hypertension) On metoprolol 12.5 mg BID PRN IV hydralazine if SBP>150 Per Neurology, strict BP control to keep SBP < 150 Titrate regimen as needed  Elevated BUN BUN increased from normal 22 >> 37 >> 44 today.  Hbg 13.8 >> 14.6 so unlikely upper GI bleed.  No signs of bleeding and pt denies abdominal pain. --Repeat CBC & BMP in AM --Monitor stools for melena --Will start empiric daily Protonix for now  ICH (intracerebral hemorrhage) (HCC) See Pontine hemorrhage  Subjective: Pt reports no concerns today. He is enjoying his breakfast and looking forward to being able to go home.    Physical Exam: Vitals:   02/20/23 1659 02/20/23 1944 02/21/23 0334 02/21/23 0402  BP: 105/62 115/61  137/71  Pulse: 74 74  71  Resp: 17 18  17   Temp: 98.1 F (36.7 C) 97.8 F (36.6 C)  97.8 F (36.6 C)  TempSrc:  Oral  Oral  SpO2: 100% 100%  99%  Weight:   65.7 kg   Height:       General exam: awake,alert, talkative, no acute distress HEENT: moist mucus membranes, hearing grossly normal  Respiratory system: on room air, normal respiratory effort. Cardiovascular system: normal S1/S2, RRR, no pedal edema.   Gastrointestinal system: soft, NT, ND Central nervous system: A&O x2+. no gross focal neurologic deficits, normal speech Skin: dry, intact, normal temperature Psychiatry: normal mood, congruent  affect   Data Reviewed:    Latest Ref Rng & Units 02/21/2023    4:53 AM 02/20/2023    4:27 AM 02/18/2023    5:17 AM  CBC  WBC 4.0 - 10.5 K/uL 8.6  8.9  7.1   Hemoglobin 13.0 - 17.0 g/dL 16.1  09.6  04.5   Hematocrit 39.0 -  52.0 % 41.7  41.4  39.3   Platelets 150 - 400 K/uL 170  165  158       Latest Ref Rng & Units 02/21/2023    4:53 AM 02/20/2023    4:27 AM 02/19/2023    4:36 AM  BMP  Glucose 70 - 99 mg/dL 409  811  94   BUN 8 - 23 mg/dL 44  50  37   Creatinine 0.61 - 1.24 mg/dL 9.14  7.82  9.56   Sodium 135 - 145 mmol/L 135  135  134   Potassium 3.5 - 5.1 mmol/L 5.1  4.5  4.6   Chloride 98 - 111 mmol/L 99  100  101   CO2 22 - 32 mmol/L 26  24  25    Calcium 8.9 - 10.3 mg/dL 9.4  9.5  9.3    Family Communication: Rosalie friend & neighbor (sig other) at bedside  Disposition: Status is: Inpatient Remains inpatient appropriate because: awaiting CIR placement / authorization   Planned Discharge Destination:  CIR    Time spent: 35 minutes  Author: Leeroy Bock, MD 02/21/2023 7:34 AM  For on call review [email or web address].

## 2023-02-21 NOTE — Progress Notes (Signed)
Occupational Therapy Treatment Patient Details Name: Ryan Allison MRN: [id number] DOB: December 15, 1940 Today's Date: 02/21/2023   History of present illness [name] is a 82 y.o. 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   OT comments  Pt is supine in bed on arrival. Easily arousable and agreeable to OT session. He denies pain. He progressed to SUP/SBA for bed mobility with HOB elevated and extra time. STS from EOB with SBA to stand at sink. Pt performed UB/LB bathing standing at sink x15 mins with SUP/SBA for safety. Pt fatigued following standing needing short seated RB prior to handoff to mobility specialist. Pt passed off to mobility specialist for ambulation. He will cont to require skilled acute OT services to maximize his safety and IND to return to PLOF.       If plan is discharge home, recommend the following:  Supervision due to cognitive status;Direct supervision/assist for medications management;Direct supervision/assist for financial management;A little help with bathing/dressing/bathroom;A little help with walking and/or transfers;Assist for transportation;Help with stairs or ramp for entrance   Equipment Recommendations       Recommendations for Other Services      Precautions / Restrictions Precautions Precautions: Fall Restrictions Weight Bearing Restrictions: No       Mobility Bed Mobility Overal bed mobility: Needs Assistance Bed Mobility: Supine to Sit     Supine to sit: Supervision, Used rails, HOB elevated     General bed mobility comments: extra time to perform, but able to do without physical assist and able to scoot to EOB    Transfers Overall  transfer level: Needs assistance   Transfers: Sit to/from Stand Sit to Stand: Supervision           General transfer comment: SBA from EOB to sink     Balance Overall balance assessment: Needs assistance Sitting-balance support: Feet supported Sitting balance-Leahy Scale: Good     Standing balance support: During functional activity, No upper extremity supported Standing balance-Leahy Scale: Good                             ADL either performed or assessed with clinical judgement   ADL Overall ADL's : Needs assistance/impaired     Grooming: Wash/dry face;Applying deodorant;Standing;Supervision/safety   Upper Body Bathing: Supervision/ safety;Standing   Lower Body Bathing: Supervison/ safety;Sit to/from stand                         General ADL Comments: Pt performed UB/LB bathing standing at sink x15 mins with SUP/SBA for safety. Pt fatigued following standing needing short seated RB prior to handoff to mobility specialist.    Extremity/Trunk Assessment Upper Extremity Assessment Upper Extremity Assessment: Overall WFL for tasks assessed            Vision       Perception     Praxis      Cognition Arousal: Alert Behavior During Therapy: [facility] for tasks assessed/performed Overall Cognitive Status: Within Functional Limits for tasks assessed  Exercises      Shoulder Instructions       General Comments fatigues with increased activity needing unilateral support    Pertinent Vitals/ Pain       Pain Assessment Pain Assessment: No/denies pain  Home Living                                          Prior Functioning/Environment              Frequency  Min 1X/week        Progress Toward Goals  OT Goals(current goals can now be found in the care plan section)  Progress towards OT goals: Progressing toward goals  Acute Rehab OT Goals Patient  Stated Goal: get stronger to return home OT Goal Formulation: With patient Time For Goal Achievement: 03/02/23 Potential to Achieve Goals: Good  Plan      Co-evaluation                 AM-PAC OT "6 Clicks" Daily Activity     Outcome Measure   Help from another person eating meals?: None Help from another person taking care of personal grooming?: None Help from another person toileting, which includes using toliet, bedpan, or urinal?: A Little Help from another person bathing (including washing, rinsing, drying)?: A Little Help from another person to put on and taking off regular upper body clothing?: A Little Help from another person to put on and taking off regular lower body clothing?: A Little 6 Click Score: 20    End of Session Equipment Utilized During Treatment: Rolling walker (2 wheels)  OT Visit Diagnosis: Unsteadiness on feet (R26.81);Other abnormalities of gait and mobility (R26.89)   Activity Tolerance Patient tolerated treatment well   Patient Left Other (comment) (handoff to mobility specialist)   Nurse Communication          Time: 5638-7564 OT Time Calculation (min): 23 min  Charges: OT General Charges $OT Visit: 1 Visit OT Treatments $Self Care/Home Management : 23-37 mins  Adarsh Mundorf, OTR/L  02/21/23, 2:40 PM   Kairah Leoni E Raevon Broom 02/21/2023, 2:38 PM

## 2023-02-21 NOTE — PMR Pre-admission (Signed)
PMR Admission Coordinator Pre-Admission Assessment  Patient: Ryan Allison is an 82 y.o., male MRN: [id number] DOB: 15-Feb-1941 Height: 5\' 10"  (177.8 cm) Weight: 65.7 kg  Insurance Information HMO:     PPO:      PCP:      IPA:      80/20: yes     OTHER:  PRIMARY: Medicare AB      Policy#: [id number]      Subscriber:  Phone#: Verified online    Fax#:  Pre-Cert#:       Employer:  Benefits:  Phone #:      Name:  Eff. Date: Parts A  and B effective 12/14/2007 Deduct: $1632      Out of Pocket Max:  None      Life Max: N/A  CIR: 100%      SNF: 100 days Outpatient: 80%     Co-Pay: 20% Home Health: 100%      Co-Pay: none DME: 80%     Co-Pay: 20% Providers: patient's choice SECONDARY: Tricare for Life       Policy#: [id number]      Phone#:   Financial Counselor:       Phone#:   The "Data Collection Information Summary" for patients in Inpatient Rehabilitation Facilities with attached "Privacy Act Statement-Health Care Records" was provided and verbally reviewed with: Patient and Family  Emergency Contact Information Contact Information     Name Relation Home Work Mobile   Dusenbery,Christopher Son [phone]     Barton Fanny   [phone]      Other Contacts   None on File     Current Medical History  Patient Admitting Diagnosis: CVA History of Present Illness:of A-flutter on Eliquis, diverticular bleed, prostate cancer s/p RT, OSA (not on CPAP), Anxiety, HTN, HLD, prior tobacco use who presented to [facility] ED on 02/14/2023 for evaluation of near syncopal episodes with diaphoresis, dizziness with weakness and dry heaving.  Upon arrival patient alert and oriented with stable vitals in Atrial flutter. Labs reassuring with the exception of hypomagnesemia. Imaging revealed small pontine ICH. NSUR alerted, patient not a candidate for surgical intervention at this time, recommending Neurology consultation. Neurology recommended admission to ICU with strict BP control 130-150 and follow  up imaging to monitor ICH overnight. Eliquis reversed. Pt. With some hypoglycemia that has resolved now. Pt. Seen by PT/OT/SLP and they recommend CIR to assist return to PLOF.   Complete NIHSS TOTAL: 1  Patient's medical record from [facility] has been reviewed by the rehabilitation admission coordinator and physician.  Past Medical History  Past Medical History:  Diagnosis Date   Amblyopia of right eye    Anxiety    Aortic atherosclerosis (HCC)    Arthritis    Atrial fibrillation and flutter (HCC) 05/2018   a.) CHA2DS2VASc = 4 (age x 2, HTN, vascular disease history); b.) rate/rhythm maintained on oral metoprolol tartrate; chronically anticoagulated with dose reduced apixaban (experienced hematuria on rivaroxaban)   B12 deficiency    Cervical spondylosis with myelopathy    Chronic kidney disease, stage 3 (HCC)    DDD (degenerative disc disease), lumbar    Diverticulosis    Esophageal dysphagia    External hemorrhoids    GERD (gastroesophageal reflux disease)    GI bleed    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   History of echocardiogram    a.) TTE 06/07/2018: EF 60-65%, no RWMAs, mild MR, PASP 33 mmHg; b.) TTE 09/19/2019: EF 50-55%, mild LVH, mod RVE, mild  LA dil, mild-mod TR, mild MR, PASP 42.5; c.) TTE 09/14/2022: EF 60-65%, mod basal-septal LVH, mild MR/AR, mod TR, AoV sclerosis, Ao root 37 mm   History of GI diverticular bleed    a.) required prolonged ICU admission at [facility]   History of kidney stones    HLD (hyperlipidemia)    Hyperlipidemia    Hypertension    Kyphoscoliosis    Meniscus tear    On apixaban therapy    OSA (obstructive sleep apnea)    a.) does not require nocturnal PAP therapy   Osteopenia    Peripheral neuropathy    Prostate cancer (HCC) 2011   PTSD (post-traumatic stress disorder)    RBBB (right bundle branch block)    SBO (small bowel obstruction) (HCC)    Skin cancer of scalp     Has the patient had major surgery during 100 days  prior to admission? No  Family History   family history includes Cancer in his brother and father; Dementia in his mother.  Current Medications  Current Facility-Administered Medications:     stroke: early stages of recovery book, , Does not apply, Once, Rust-Chester, Micheline Rough L, NP   acetaminophen (TYLENOL) tablet 650 mg, 650 mg, Oral, Q4H PRN, 650 mg at 02/16/23 2044 **OR** acetaminophen (TYLENOL) 160 MG/5ML solution 650 mg, 650 mg, Per Tube, Q4H PRN **OR** acetaminophen (TYLENOL) suppository 650 mg, 650 mg, Rectal, Q4H PRN, Rust-Chester, Micheline Rough L, NP   feeding supplement (ENSURE ENLIVE / ENSURE PLUS) liquid 237 mL, 237 mL, Oral, TID BM, Aleskerov, Fuad, MD, 237 mL at 02/20/23 2015   hydrALAZINE (APRESOLINE) injection 10-20 mg, 10-20 mg, Intravenous, Q4H PRN, Rust-Chester, Britton L, NP, 20 mg at 02/16/23 0308   metoprolol tartrate (LOPRESSOR) tablet 12.5 mg, 12.5 mg, Oral, BID, Dgayli, Khabib, MD, 12.5 mg at 02/20/23 2119   multivitamin with minerals tablet 1 tablet, 1 tablet, Oral, Daily, Vida Rigger, MD, 1 tablet at 02/20/23 0912   ondansetron (ZOFRAN) injection 4 mg, 4 mg, Intravenous, Q8H PRN, Lorretta Harp, MD   Oral care mouth rinse, 15 mL, Mouth Rinse, PRN, Karna Christmas, Fuad, MD   pantoprazole (PROTONIX) EC tablet 40 mg, 40 mg, Oral, Daily, Esaw Grandchild A, DO, 40 mg at 02/20/23 0912   senna-docusate (Senokot-S) tablet 1 tablet, 1 tablet, Oral, BID PRN, Mila Merry A, RPH   simvastatin (ZOCOR) tablet 20 mg, 20 mg, Oral, QHS, Rust-Chester, Britton L, NP, 20 mg at 02/20/23 2119  Patients Current Diet:  Diet Order             Diet regular Room service appropriate? No; Fluid consistency: Thin  Diet effective now                   Precautions / Restrictions Precautions Precautions: Fall Restrictions Weight Bearing Restrictions: No   Has the patient had 2 or more falls or a fall with injury in the past year? No  Prior Activity Level Community (5-7x/wk): Pt. active in  the community PTA  Prior Functional Level Self Care: Did the patient need help bathing, dressing, using the toilet or eating? Independent  Indoor Mobility: Did the patient need assistance with walking from room to room (with or without device)? Independent  Stairs: Did the patient need assistance with internal or external stairs (with or without device)? Independent  Functional Cognition: Did the patient need help planning regular tasks such as shopping or remembering to take medications? Independent  Patient Information Are you of Hispanic, Latino/a,or Spanish origin?: A. No, not  of Hispanic, Latino/a, or Spanish origin What is your race?: A. White Do you need or want an interpreter to communicate with a doctor or health care staff?: 0. No  Patient's Response To:  Health Literacy and Transportation Is the patient able to respond to health literacy and transportation needs?: Yes Health Literacy - How often do you need to have someone help you when you read instructions, pamphlets, or other written material from your doctor or pharmacy?: Never In the past 12 months, has lack of transportation kept you from medical appointments or from getting medications?: No In the past 12 months, has lack of transportation kept you from meetings, work, or from getting things needed for daily living?: No  Home Assistive Devices / Equipment Home Equipment: Agricultural consultant (2 wheels), [employer] - single point  Prior Device Use: Indicate devices/aids used by the patient prior to current illness, exacerbation or injury? None of the above  Current Functional Level Cognition  Arousal/Alertness: Awake/alert Overall Cognitive Status: Within Functional Limits for tasks assessed Orientation Level: Oriented X4 Following Commands: Follows one step commands with increased time, Follows one step commands inconsistently General Comments: states location as hospital, unable to state time Problem Solving:  Impaired Problem Solving Impairment:  (unable to recall/demo use of call bell without cues)    Extremity Assessment (includes Sensation/Coordination)  Upper Extremity Assessment: Overall WFL for tasks assessed  Lower Extremity Assessment: Difficult to assess due to impaired cognition (Pt LE's overall strong during MMT)    ADLs  Overall ADL's : Needs assistance/impaired Grooming: Oral care, Standing, Supervision/safety Upper Body Bathing: Set up, Sitting Lower Body Bathing: Minimal assistance, Sit to/from stand Upper Body Dressing : Set up, Sitting Tub/ Shower Transfer: Minimal assistance, Grab bars, Rolling walker (2 wheels), Ambulation, Cueing for safety Functional mobility during ADLs: Contact guard assist General ADL Comments: Pt stood at sink x8 mins to perform oral hygiene with RW in place and no UE support to maintain balance with SUP.    Mobility  Overal bed mobility: Needs Assistance Bed Mobility: Supine to Sit Supine to sit: Min assist Sit to supine: Supervision General bed mobility comments: increased time and cues    Transfers  Overall transfer level: Needs assistance Equipment used: Rolling walker (2 wheels) Transfers: Sit to/from Stand Sit to Stand: Contact guard assist General transfer comment: cues for hand placement    Ambulation / Gait / Stairs / Wheelchair Mobility  Ambulation/Gait Ambulation/Gait assistance: Contact guard assist Gait Distance (Feet): 360 Feet Assistive device: Rolling walker (2 wheels) Gait Pattern/deviations: Step-through pattern, Decreased step length - right, Decreased step length - left, Staggering left, Staggering right General Gait Details: overall improving but increased sway side to side and back with gait increasing fall risk Gait velocity: decresed    Posture / Balance Balance Overall balance assessment: Needs assistance Sitting-balance support: Feet supported Sitting balance-Leahy Scale: Good Standing balance support: During  functional activity, No upper extremity supported Standing balance-Leahy Scale: Good Standing balance comment: dynamic standing at sink for oral hygiene and during shower transfer    Special needs/care consideration Special service needs none   Previous Home Environment (from acute therapy documentation) Living Arrangements: Alone  Lives With: Alone Available Help at Discharge: Neighbor Type of Home: House Home Layout: One level Home Access: Stairs to enter Entrance Stairs-Rails: Right (vertical grab bar on R side of garage steps) Entrance Stairs-Number of Steps: 1 (1 step in garage, 1 step in front of house) Home Care Services: No Additional Comments: Pt home set  up provided via neighbor who was present during evauation  Discharge Living Setting Plans for Discharge Living Setting: Patient's home Type of Home at Discharge: House Discharge Home Layout: One level Discharge Home Access: Stairs to enter Entrance Stairs-Rails: Right Entrance Stairs-Number of Steps: 1 Discharge Bathroom Shower/Tub: Walk-in shower Discharge Bathroom Toilet: Standard Discharge Bathroom Accessibility: Yes How Accessible: Accessible via walker Does the patient have any problems obtaining your medications?: No  Social/Family/Support Systems Patient Roles: Partner Contact Information: [phone] Anticipated Caregiver: [name] (this is patient's partner, she lives next door but is going to move into his home to care for him Caregiver Availability: 24/7 Discharge Plan Discussed with Primary Caregiver: Yes Is Caregiver In Agreement with Plan?: Yes Does Caregiver/Family have Issues with Lodging/Transportation while Pt is in Rehab?: No  Goals Patient/Family Goal for Rehab: PT/OT Mod I, SLP supervision Expected length of stay: 5-7 days Pt/Family Agrees to Admission and willing to participate: Yes Program Orientation Provided & Reviewed with Pt/Caregiver Including Roles  & Responsibilities: Yes  Decrease  burden of Care through IP rehab admission: not anticipated  Possible need for SNF placement upon discharge: not anticipated  Patient Condition: I have reviewed medical records from [facility], spoken with CM, and patient, son, and family member. I discussed via phone for inpatient rehabilitation assessment.  Patient will benefit from ongoing PT, OT, and SLP, can actively participate in 3 hours of therapy a day 5 days of the week, and can make measurable gains during the admission.  Patient will also benefit from the coordinated team approach during an Inpatient Acute Rehabilitation admission.  The patient will receive intensive therapy as well as Rehabilitation physician, nursing, social worker, and care management interventions.  Due to safety, skin/wound care, disease management, medication administration, pain management, and patient education the patient requires 24 hour a day rehabilitation nursing.  The patient is currently min A- CGA with mobility and basic ADLs.  Discharge setting and therapy post discharge at home with home health is anticipated.  Patient has agreed to participate in the Acute Inpatient Rehabilitation Program and will admit today.  Preadmission Screen Completed By:  Jeronimo Greaves, 02/21/2023 8:36 AM ______________________________________________________________________   Discussed status with Dr. Riley Kill  on 02/23/23 at 930 and received approval for admission today.  Admission Coordinator:  Jeronimo Greaves, CCC-SLP, time 1104/Date 02/23/23  Assessment/Plan: Diagnosis:  midline pontine hemorrhage Does the need for close, 24 hr/day Medical supervision in concert with the patient's rehab needs make it unreasonable for this patient to be served in a less intensive setting? Yes Co-Morbidities requiring supervision/potential complications: a flutter, diverticular bleed hx, prostate cancer, OSA, anxiety   Due to bladder management, bowel management, safety,  skin/wound care, disease management, medication administration, pain management, and patient education, does the patient require 24 hr/day rehab nursing? Yes Does the patient require coordinated care of a physician, rehab nurse, PT, OT, and SLP to address physical and functional deficits in the context of the above medical diagnosis(es)? Yes Addressing deficits in the following areas: balance, endurance, locomotion, strength, transferring, bowel/bladder control, bathing, dressing, feeding, grooming, toileting, cognition, speech, swallowing, and psychosocial support Can the patient actively participate in an intensive therapy program of at least 3 hrs of therapy 5 days a week? Yes The potential for patient to make measurable gains while on inpatient rehab is excellent Anticipated functional outcomes upon discharge from inpatient rehab: modified independent PT, modified independent OT, supervision SLP Estimated rehab length of stay to reach the above functional goals  is: 5-7 days Anticipated discharge destination: Home 10. Overall Rehab/Functional Prognosis: excellent   MD Signature: Ranelle Oyster, MD, [facility] Physical Medicine & Rehabilitation Medical Director Rehabilitation Services 02/23/2023

## 2023-02-21 NOTE — Progress Notes (Signed)
Inpatient Rehab Admissions Coordinator:    CIR following. I do not have a bed for this pt. Today. I will continue to follow for potential admit pending bed availability.   Megan Salon, MS, CCC-SLP Rehab Admissions Coordinator  [phone] (celll) [phone] (office)

## 2023-02-21 NOTE — Progress Notes (Signed)
Physical Therapy Treatment Patient Details Name: Ryan Allison MRN: [id number] DOB: April 03, 1941 Today's Date: 02/21/2023   History of Present Illness [name] is a 82 y.o. 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    PT Comments  Pt is A and O x 3. Agreeable and pleasant throughout. He endorses eagerness to get to rehab. Unaware that there is not a bed available at CIR today. Pt was able to tolerate getting OOB, standing, and ambulating > 200 ft. Safely performed ascending descending 4 stair with CGA for safety. Pt lives alone but has assistance from neighbor. Overall pt tolerates session well but is severely limited by fatigue. Pt has flexed cervical spine at baseline but was encouraged to continue to stretch and work on improving posture with all ADLs.    If plan is discharge home, recommend the following: A little help with walking and/or transfers;A little help with bathing/dressing/bathroom;Assistance with cooking/housework;Direct supervision/assist for medications management;Direct supervision/assist for financial management;Assist for transportation;Help with stairs or ramp for entrance     Equipment Recommendations  Other (comment) (Defer to next level of care)       Precautions / Restrictions Precautions Precautions: Fall Restrictions Weight Bearing Restrictions: No     Mobility  Bed Mobility Overal bed mobility: Needs Assistance Bed Mobility: Supine to Sit  Supine to sit: Min assist  General bed mobility comments: Pt requires increased time + min assist exit exit L sid eof bed withHOB elevated + use of bedrail    Transfers Overall transfer level: Needs  assistance Equipment used: Rolling walker (2 wheels) Transfers: Sit to/from Stand Sit to Stand: Contact guard assist  General transfer comment: CGA for safety with vcs for improved technique    Ambulation/Gait Ambulation/Gait assistance: Contact guard assist, Supervision Gait Distance (Feet): 200 Feet Assistive device: Rolling walker (2 wheels) Gait Pattern/deviations: Step-through pattern, Decreased step length - right, Decreased step length - left, Staggering left, Staggering right Gait velocity: decreased  General Gait Details: pt ambulated ~ 200 ft with RW. vsc for posture correction and overall improved gait quality. Pt does fatigue quickly however vitals remained stable   Stairs Stairs: Yes Stairs assistance: Contact guard assist, Supervision Stair Management: Two rails, Alternating pattern, Forwards Number of Stairs: 4 General stair comments: Pt demonstrated safe abilities to ascend/decend 4 stair with CGA for safety   Balance Overall balance assessment: Needs assistance Sitting-balance support: Feet supported Sitting balance-Leahy Scale: Good     Standing balance support: During functional activity, No upper extremity supported Standing balance-Leahy Scale: Good Standing balance comment: reliant on AD once fatigued     Cognition Arousal: Alert Behavior During Therapy: WFL for tasks assessed/performed Overall Cognitive Status: Within Functional Limits for tasks assessed    General Comments: Pt is A and O x 3. pleasant and agreeable. Motivated to improve sio he can return home after rehab.               Pertinent Vitals/Pain Pain Assessment Pain Assessment: No/denies pain     PT Goals (current goals can now be found in the care plan section) Acute Rehab PT Goals Patient Stated Goal: "Get better so I can go home." Progress towards PT goals: Progressing toward goals    Frequency  Min 1X/week       AM-PAC PT "6 Clicks" Mobility   Outcome Measure   Help needed turning from your back to your side while in a flat bed without using bedrails?: A Little Help needed moving from lying on your back to sitting on the side of a flat bed without using bedrails?: A Little Help needed moving to and from a bed to a chair (including a wheelchair)?: A Little Help needed standing up from a chair using your arms (e.g., wheelchair or bedside chair)?: A Little Help needed to walk in hospital room?: A Little Help needed climbing 3-5 steps with a railing? : A Little 6 Click Score: 18    End of Session   Activity Tolerance: Patient tolerated treatment well;Patient limited by fatigue Patient left: in chair;with family/visitor present;with call bell/phone within reach;with chair alarm set Nurse Communication: Mobility status PT Visit Diagnosis: Other abnormalities of gait and mobility (R26.89);Difficulty in walking, not elsewhere classified (R26.2);Unsteadiness on feet (R26.81)     Time: 4696-2952 PT Time Calculation (min) (ACUTE ONLY): 22 min  Charges:    $Gait Training: 8-22 mins PT General Charges $$ ACUTE PT VISIT: 1 Visit                     Jetta Lout PTA 02/21/23, 11:47 AM

## 2023-02-21 NOTE — Progress Notes (Signed)
SLP Cancellation Note  Patient Details Name: Ryan Allison MRN: [id number] DOB: 01/09/1941   Cancelled treatment:       Reason Eval/Treat Not Completed: Patient at procedure or test/unavailable (Pt working with PTA. Will continue efforts as appropriate.)  [name], M.S., CCC-SLP Speech-Language Pathologist [facility] [phone] Ryan Allison)  Ryan Allison 02/21/2023, 11:18 AM

## 2023-02-21 NOTE — Progress Notes (Signed)
Mobility Specialist - Progress Note   02/21/23 1442  Mobility  Activity Ambulated with assistance in hallway  Level of Assistance Contact guard assist, steadying assist  Assistive Device Front wheel walker  Distance Ambulated (ft) 80 ft  Activity Response Tolerated well  $Mobility charge 1 Mobility  Mobility Specialist Start Time (ACUTE ONLY) 1417  Mobility Specialist Stop Time (ACUTE ONLY) 1429  Mobility Specialist Time Calculation (min) (ACUTE ONLY) 12 min   Pt standing near the sink upon entry, utilizing RA. Session concludes with OT, MS acquires. Pt amb one hall CGA, expressed feeling "tired and winded" ~ 69ft into amb-- constant cueing to rasie head during amb. Pt returned to the room, left supine with alarm set and needs within reach.  Zetta Bills Mobility Specialist 02/21/23 2:47 PM

## 2023-02-22 DIAGNOSIS — E440 Moderate protein-calorie malnutrition: Secondary | ICD-10-CM | POA: Insufficient documentation

## 2023-02-22 DIAGNOSIS — I4892 Unspecified atrial flutter: Secondary | ICD-10-CM | POA: Diagnosis not present

## 2023-02-22 DIAGNOSIS — I613 Nontraumatic intracerebral hemorrhage in brain stem: Secondary | ICD-10-CM | POA: Diagnosis not present

## 2023-02-22 LAB — GLUCOSE, CAPILLARY
Glucose-Capillary: 84 mg/dL (ref 70–99)
Glucose-Capillary: 96 mg/dL (ref 70–99)
Glucose-Capillary: 117 mg/dL — ABNORMAL HIGH (ref 70–99)

## 2023-02-22 MED ORDER — MELATONIN 5 MG PO TABS
5.0000 mg | ORAL_TABLET | Freq: Once | ORAL | Status: AC
  Administered 2023-02-22: 5 mg via ORAL
  Filled 2023-02-22 (×2): qty 1

## 2023-02-22 NOTE — Progress Notes (Signed)
[facility] IP rehab admissions - Currently CIR at [facility] is full.  I do not have a bed available for this patient today.  Patient is doing well with mobility.  My partner will follow up again tomorrow.  Call for questions.  [phone]

## 2023-02-22 NOTE — Plan of Care (Signed)
  Problem: Education: Goal: Knowledge of disease or condition will improve Outcome: Progressing Goal: Knowledge of secondary prevention will improve (MUST DOCUMENT ALL) Outcome: Progressing Goal: Knowledge of patient specific risk factors will improve (Mark N/A or DELETE if not current risk factor) Outcome: Progressing   Problem: Intracerebral Hemorrhage Tissue Perfusion: Goal: Complications of Intracerebral Hemorrhage will be minimized Outcome: Progressing   Problem: Coping: Goal: Will verbalize positive feelings about self Outcome: Progressing Goal: Will identify appropriate support needs Outcome: Progressing   Problem: Health Behavior/Discharge Planning: Goal: Ability to manage health-related needs will improve Outcome: Progressing Goal: Goals will be collaboratively established with patient/family Outcome: Progressing   Problem: Self-Care: Goal: Ability to participate in self-care as condition permits will improve Outcome: Progressing Goal: Verbalization of feelings and concerns over difficulty with self-care will improve Outcome: Progressing Goal: Ability to communicate needs accurately will improve Outcome: Progressing   Problem: Nutrition: Goal: Risk of aspiration will decrease Outcome: Progressing Goal: Dietary intake will improve Outcome: Progressing   Problem: Education: Goal: Knowledge of General Education information will improve Description: Including pain rating scale, medication(s)/side effects and non-pharmacologic comfort measures Outcome: Progressing   Problem: Health Behavior/Discharge Planning: Goal: Ability to manage health-related needs will improve Outcome: Progressing   Problem: Clinical Measurements: Goal: Ability to maintain clinical measurements within normal limits will improve Outcome: Progressing Goal: Will remain free from infection Outcome: Progressing Goal: Diagnostic test results will improve Outcome: Progressing Goal:  Respiratory complications will improve Outcome: Progressing Goal: Cardiovascular complication will be avoided Outcome: Progressing   Problem: Activity: Goal: Risk for activity intolerance will decrease Outcome: Progressing   Problem: Nutrition: Goal: Adequate nutrition will be maintained Outcome: Progressing   Problem: Coping: Goal: Level of anxiety will decrease Outcome: Progressing   Problem: Elimination: Goal: Will not experience complications related to bowel motility Outcome: Progressing Goal: Will not experience complications related to urinary retention Outcome: Progressing   Problem: Pain Managment: Goal: General experience of comfort will improve Outcome: Progressing   Problem: Safety: Goal: Ability to remain free from injury will improve Outcome: Progressing   Problem: Skin Integrity: Goal: Risk for impaired skin integrity will decrease Outcome: Progressing   

## 2023-02-22 NOTE — Progress Notes (Signed)
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 Of note patient on a reduced dose of Eliquis at 2.5 mg BID due to history of diverticular bleeding and hematuria. Patient has declined Watchman device/ cardioversion previously.  They presented to [facility] ED on 02/14/2023 for evaluation of near syncopal episodes with diaphoresis, dizziness with weakness and dry heaving.  Initially admitted to ICU.  TRH service assumed care 02/17/2023.   ED course: Upon arrival patient alert and oriented with stable vitals in Atrial flutter. Labs reassuring with the exception of hypomagnesemia. Imaging revealed small pontine ICH. NSUR alerted, patient not a candidate for surgical intervention at this time, recommending Neurology consultation. Neurology recommended admission to ICU with strict BP control 130-150 and follow up imaging to monitor ICH overnight. Eliquis reversed. Started on cleviprex drip for HTN.    10/9-10/10- patient has remained stable since transfer to [facility] service. PT/OT evaluated and recommended acute rehab. He has been approved for CIR and is currently awaiting an available bed.   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   --Neurology consulted --Strict BP control with SBP < 150 --Hold off anticoagulation or antithrombotics until follow up --PT/OT/SLP following --CIR recommended - pending   Near syncope Mostly likely due to his ICH Continue PT/OT Fall precautions  Paroxysmal atrial fibrillation (HCC) A-flutter --  HR's  controlled Holding Eliquis given ICH Continue metoprolol Telemetry monitoring  Hypomagnesemia Replaced on 10/5 for Mg 1.5 --Maintain Mg > 2 given A-fib/flutter  Hyperlipidemia On Zocor  Hypoglycemia RESOLVED. Was on D10w, now off and no hypoglycemic episodes. --Encourage PO intake --Offer juices, snacks between meals --Monitor CBG's --Hypoglycemia protocol  HTN (hypertension) On metoprolol 12.5 mg BID PRN IV hydralazine if SBP>150 Per Neurology, strict BP control to keep SBP < 150 Titrate regimen as needed  Elevated BUN BUN increased from normal 22 >> 37 >> 44 today.  Hbg 13.8 >> 14.6 so unlikely upper GI bleed.  No signs of bleeding and pt denies abdominal pain. --Repeat CBC & BMP in AM --Monitor stools for melena --Will start empiric daily Protonix for now  ICH (intracerebral hemorrhage) (HCC) See Pontine hemorrhage  Subjective: Pt reports feeling tired today. He just doesn't want to be in the hospital any more. Tired of being poked. No other complaints or concerns.  Physical Exam: Vitals:   02/21/23 1629 02/21/23 1958 02/22/23 0411 02/22/23 0511  BP: 116/64 (!) 120/50  131/72  Pulse: 80 90  68  Resp: 18 17  17   Temp: (!) 97.5 F (36.4 C) 98.5 F (36.9 C)  (!) 97.5 F (36.4 C)  TempSrc:  Oral  Oral  SpO2: 100% 100%  99%  Weight:   71 kg   Height:       General exam: awake,alert, talkative, no acute distress HEENT: moist mucus membranes, hearing grossly normal  Respiratory system: on room air, normal respiratory effort. Cardiovascular system: normal S1/S2, RRR, no pedal edema.   Gastrointestinal system: soft, NT, ND Central nervous system: A&O x2+. no gross focal neurologic deficits, normal speech Skin: dry,  intact, normal temperature Psychiatry: normal mood, congruent affect  Data Reviewed:    Latest Ref Rng & Units 02/21/2023    4:53 AM 02/20/2023    4:27 AM 02/18/2023    5:17 AM  CBC  WBC 4.0 - 10.5 K/uL 8.6  8.9  7.1   Hemoglobin 13.0 - 17.0 g/dL  16.1  09.6  04.5   Hematocrit 39.0 - 52.0 % 41.7  41.4  39.3   Platelets 150 - 400 K/uL 170  165  158       Latest Ref Rng & Units 02/21/2023    4:53 AM 02/20/2023    4:27 AM 02/19/2023    4:36 AM  BMP  Glucose 70 - 99 mg/dL 409  811  94   BUN 8 - 23 mg/dL 44  50  37   Creatinine 0.61 - 1.24 mg/dL 9.14  7.82  9.56   Sodium 135 - 145 mmol/L 135  135  134   Potassium 3.5 - 5.1 mmol/L 5.1  4.5  4.6   Chloride 98 - 111 mmol/L 99  100  101   CO2 22 - 32 mmol/L 26  24  25    Calcium 8.9 - 10.3 mg/dL 9.4  9.5  9.3    Family Communication: Rosalie friend & neighbor (sig other) at bedside  Disposition: Status is: Inpatient Remains inpatient appropriate because: awaiting CIR placement / authorization   Planned Discharge Destination:  CIR    Time spent: 35 minutes  Author: Leeroy Bock, MD 02/22/2023 7:07 AM  For on call review [email or web address].

## 2023-02-22 NOTE — Consult Note (Signed)
WOC Nurse ostomy consult note: Patient has longstanding colostomy and request for pouch change was placed.  I sent secure chat to [place], [place].  She indicates that patient's pouch is filling up with flatus and she is emptying often.  States he needs a filtered pouch. He wears a 2 piece convex at home.  With barrier ring and unfiltered.  Neither patient nor wife are sure which supplier they currently use.  RN to assist patient with pouch change as needed.  If she can tell me supplier of pouches as well as manufacturer, I can suggest a filtered pouch to meet their needs.   Patient has his pouch here from home and will use for now.  IF they would like to switch to a one piece filtered pouch, the RN can order [employer] [id number]- 1 piece convex filtered.  THe RN will assist with pouch change and I will stand by for more information from the bedside RN. She can secure chat or she has my cell phone number.  A referral to the outpatient ostomy clinic may also be beneficial.  They can call [phone] for assistance with the clinic Will not follow at this time.  Please re-consult if needed.  Mike Gip MSN, RN, FNP-BC CWON Wound, Ostomy, Continence Nurse Outpatient [facility] [phone] Pager [phone]

## 2023-02-22 NOTE — Progress Notes (Signed)
PT Cancellation Note  Patient Details Name: Ryan Allison MRN: [id number] DOB: 01-17-1941   Cancelled Treatment:     PT attempt. Pt ordering breakfast upon arrival. Pt overall endorses frustration and politely requested author return at a later time. Supportive/significant other/ neighbor at bedside. Both requesting update about possible CIR admit. Acute PT will continue to follow per current POC.    Rushie Chestnut 02/22/2023, 9:33 AM

## 2023-02-22 NOTE — Progress Notes (Signed)
Occupational Therapy Treatment Patient Details Name: [name] MRN: [id number] DOB: 09-10-1940 Today's Date: 02/22/2023   History of present illness [name] is a 82 y.o. 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   OT comments  Pt is supine in bed on arrival. Pleasant and agreeable to OT session. He denies pain. Agreeable to ADL session to take a shower. SUP with bed mobility, STS from EOB and CGA/SBA for mobility to and from the bathroom. BSC set up in shower for pt to take seated shower. UB ADLs required SUP and LB ADLs CGA for safety. CGA and use of grab bar for transfer in/out. Pt able to stand over commode and empty his own colostomy bag with SBA and no UE support with no LOB noted. Pt pleased with ability to shower and wishes to get a shave tomorrow. He continues to fatigue easily with all activity requiring pacing and occasional rest breaks. Pt returned to bed with all needs in place and will cont to require skilled acute OT services to maximize his safety and IND to return to PLOF.       If plan is discharge home, recommend the following:  Supervision due to cognitive status;Direct supervision/assist for medications management;Direct supervision/assist for financial management;A little help with bathing/dressing/bathroom;A little help with walking and/or transfers;Assist for transportation;Help with stairs or ramp for entrance   Equipment Recommendations  None recommended by OT    Recommendations for Other Services      Precautions / Restrictions Precautions Precautions: Fall Restrictions Weight Bearing Restrictions: No       Mobility Bed Mobility Overal bed  mobility: Needs Assistance Bed Mobility: Supine to Sit     Supine to sit: Supervision, Used rails, HOB elevated Sit to supine: Supervision   General bed mobility comments: pt able to perform without physical assist.    Transfers Overall transfer level: Needs assistance Equipment used: Rolling walker (2 wheels) Transfers: Sit to/from Stand Sit to Stand: Supervision                 Balance Overall balance assessment: Needs assistance Sitting-balance support: Feet supported Sitting balance-Leahy Scale: Good     Standing balance support: During functional activity, Single extremity supported Standing balance-Leahy Scale: Good                             ADL either performed or assessed with clinical judgement   ADL Overall ADL's : Needs assistance/impaired         Upper Body Bathing: Sitting;Supervision/ safety   Lower Body Bathing: Supervison/ safety;Sit to/from stand   Upper Body Dressing : Standing;Supervision/safety           Toileting- Clothing Manipulation and Hygiene: Supervision/safety Toileting - Clothing Manipulation Details (indicate cue type and reason): stood over toilet in room without UE support to empty ostomy Tub/ Shower Transfer: Contact guard assist;Grab Scientist, product/process development Details (indicate cue type and reason): sat on BSC in shower Functional mobility during ADLs: Contact guard assist;Supervision/safety General ADL Comments: bathing completed seated on BSC in shower today with CGA for transfer in/out, SUP for bathing tasks    Extremity/Trunk Assessment Upper Extremity Assessment Upper Extremity Assessment: Generalized weakness  Lower Extremity Assessment Lower Extremity Assessment: Generalized weakness        Vision       Perception     Praxis      Cognition Arousal: Alert Behavior During Therapy: WFL for tasks assessed/performed Overall Cognitive Status: Within Functional Limits for tasks  assessed Area of Impairment: Orientation, Following commands, Problem solving                                        Exercises      Shoulder Instructions       General Comments      Pertinent Vitals/ Pain       Pain Assessment Pain Assessment: No/denies pain  Home Living                                          Prior Functioning/Environment              Frequency  Min 1X/week        Progress Toward Goals  OT Goals(current goals can now be found in the care plan section)     Acute Rehab OT Goals OT Goal Formulation: With patient Time For Goal Achievement: 03/02/23 Potential to Achieve Goals: Good  Plan      Co-evaluation                 AM-PAC OT "6 Clicks" Daily Activity     Outcome Measure   Help from another person eating meals?: None Help from another person taking care of personal grooming?: None Help from another person toileting, which includes using toliet, bedpan, or urinal?: A Little Help from another person bathing (including washing, rinsing, drying)?: A Little Help from another person to put on and taking off regular upper body clothing?: A Little Help from another person to put on and taking off regular lower body clothing?: A Little 6 Click Score: 20    End of Session Equipment Utilized During Treatment: Rolling walker (2 wheels)  OT Visit Diagnosis: Unsteadiness on feet (R26.81);Other abnormalities of gait and mobility (R26.89)   Activity Tolerance Patient tolerated treatment well   Patient Left in bed;with call bell/phone within reach;with bed alarm set;with family/visitor present   Nurse Communication Mobility status        Time: 5284-1324 OT Time Calculation (min): 38 min  Charges: OT General Charges $OT Visit: 1 Visit OT Treatments $Self Care/Home Management : 38-52 mins  Bijan Ridgley, OTR/L  02/22/23, 4:51 PM   Majesti Gambrell E Roxine Whittinghill 02/22/2023, 4:48 PM

## 2023-02-22 NOTE — Progress Notes (Signed)
Physical Therapy Treatment Patient Details Name: Ryan Allison MRN: [id number] DOB: 1940/08/10 Today's Date: 02/22/2023   History of Present Illness Ryan Allison is a 82 y.o. 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    PT Comments  Pt agreeable to therapy as he is unsure of if he will be going to CIR today.  Pt received in Semi-Fowler's position upon arrival with spouse in room.  Pt performed bed mobility with good technique and without complication, just utilizes increased time.  Pt steady upon standing and utilizes UE support on walker in order to don robe with assistance from therapist and spouse.  Pt then ambulated out into the hallway and performed standing exercises in order to improve overall LE strength.  Pt did lateral side-stepping in the hallway with UE support on the railing down/back ~25' each direction.  Pt then ambulated around the nursing station and back to the room where he utilized the restroom and was returned to the bed with all needs met.   Pt is expected to be discharged to CIR at some point soon.      If plan is discharge home, recommend the following: A little help with walking and/or transfers;A little help with bathing/dressing/bathroom;Assistance with cooking/housework;Direct supervision/assist for medications management;Direct supervision/assist for financial management;Assist for transportation;Help with stairs or ramp for entrance   Can travel by private vehicle        Equipment Recommendations  Other (comment) (defer to next level of care)    Recommendations for Other Services       Precautions / Restrictions Precautions Precautions:  Fall Restrictions Weight Bearing Restrictions: No     Mobility  Bed Mobility Overal bed mobility: Needs Assistance Bed Mobility: Supine to Sit     Supine to sit: Supervision, Used rails, HOB elevated Sit to supine: Supervision   General bed mobility comments: pt able to perform without physical assist.    Transfers Overall transfer level: Needs assistance Equipment used: Rolling walker (2 wheels) Transfers: Sit to/from Stand Sit to Stand: Supervision                Ambulation/Gait Ambulation/Gait assistance: Contact guard assist, Supervision Gait Distance (Feet): 200 Feet Assistive device: Rolling walker (2 wheels) Gait Pattern/deviations: Step-through pattern, Decreased step length - right, Decreased step length - left, Staggering left, Staggering right Gait velocity: decreased     General Gait Details: Pt ambulated 200' and performed standing exercises with UE support within the hallway.   Stairs             Wheelchair Mobility     Tilt Bed    Modified Rankin (Stroke Patients Only)       Balance Overall balance assessment: Needs assistance Sitting-balance support: Feet supported Sitting balance-Leahy Scale: Good     Standing balance support: During functional activity, No upper extremity supported Standing balance-Leahy Scale: Good                              Cognition Arousal: Alert Behavior During Therapy: WFL for tasks assessed/performed Overall Cognitive Status: Within Functional Limits for tasks assessed  Exercises Other Exercises all with UE support on the railing for balance:  Standing hip abduction, 2x10 each LE Standing hip extension, 2x10 each LE Standing hamstring curls, 2x10 each LE Standing squats, 2x10 each LE Standing heel raises, 2x10      General Comments        Pertinent Vitals/Pain Pain Assessment Pain Assessment: No/denies pain     Home Living                          Prior Function            PT Goals (current goals can now be found in the care plan section) Acute Rehab PT Goals Patient Stated Goal: "Get better so I can go home." PT Goal Formulation: With patient Progress towards PT goals: Progressing toward goals    Frequency    Min 1X/week      PT Plan      Co-evaluation              AM-PAC PT "6 Clicks" Mobility   Outcome Measure  Help needed turning from your back to your side while in a flat bed without using bedrails?: A Little Help needed moving from lying on your back to sitting on the side of a flat bed without using bedrails?: A Little Help needed moving to and from a bed to a chair (including a wheelchair)?: A Little Help needed standing up from a chair using your arms (e.g., wheelchair or bedside chair)?: A Little Help needed to walk in hospital room?: A Little Help needed climbing 3-5 steps with a railing? : A Little 6 Click Score: 18    End of Session Equipment Utilized During Treatment: Gait belt Activity Tolerance: Patient tolerated treatment well;Patient limited by fatigue Patient left: in bed;with call bell/phone within reach;with bed alarm set;with family/visitor present Nurse Communication: Mobility status PT Visit Diagnosis: Other abnormalities of gait and mobility (R26.89);Difficulty in walking, not elsewhere classified (R26.2);Unsteadiness on feet (R26.81)     Time: 1610-9604 PT Time Calculation (min) (ACUTE ONLY): 30 min  Charges:    $Therapeutic Exercise: 8-22 mins $Therapeutic Activity: 8-22 mins PT General Charges $$ ACUTE PT VISIT: 1 Visit                     Nolon Bussing, PT, DPT Physical Therapist - [facility]  02/22/23, 4:23 PM

## 2023-02-22 NOTE — Care Management Important Message (Signed)
Important Message  Patient Details  Name: Ryan Allison MRN: [id number] Date of Birth: [birth year]   Important Message Given:  Yes - [employer] IM     Olegario Messier A Nakima Fluegge 02/22/2023, 2:54 PM

## 2023-02-23 ENCOUNTER — Encounter (HOSPITAL_COMMUNITY): Payer: Self-pay | Admitting: Physical Medicine & Rehabilitation

## 2023-02-23 ENCOUNTER — Inpatient Hospital Stay (HOSPITAL_COMMUNITY)
Admission: AD | Admit: 2023-02-23 | Discharge: 2023-02-28 | DRG: 057 | Disposition: A | Discharge status: Home Health | Payer: Medicare Other | Source: Other Acute Inpatient Hospital | Attending: Physical Medicine & Rehabilitation | Admitting: Physical Medicine & Rehabilitation

## 2023-02-23 ENCOUNTER — Other Ambulatory Visit: Payer: Self-pay

## 2023-02-23 DIAGNOSIS — Z23 Encounter for immunization: Secondary | ICD-10-CM | POA: Diagnosis present

## 2023-02-23 DIAGNOSIS — I69198 Other sequelae of nontraumatic intracerebral hemorrhage: Principal | ICD-10-CM

## 2023-02-23 DIAGNOSIS — I619 Nontraumatic intracerebral hemorrhage, unspecified: Principal | ICD-10-CM | POA: Diagnosis present

## 2023-02-23 DIAGNOSIS — D1802 Hemangioma of intracranial structures: Secondary | ICD-10-CM | POA: Diagnosis present

## 2023-02-23 DIAGNOSIS — I10 Essential (primary) hypertension: Secondary | ICD-10-CM | POA: Diagnosis not present

## 2023-02-23 DIAGNOSIS — K219 Gastro-esophageal reflux disease without esophagitis: Secondary | ICD-10-CM | POA: Diagnosis present

## 2023-02-23 DIAGNOSIS — I5032 Chronic diastolic (congestive) heart failure: Secondary | ICD-10-CM | POA: Diagnosis present

## 2023-02-23 DIAGNOSIS — I4892 Unspecified atrial flutter: Secondary | ICD-10-CM | POA: Diagnosis present

## 2023-02-23 DIAGNOSIS — Z932 Ileostomy status: Secondary | ICD-10-CM

## 2023-02-23 DIAGNOSIS — G479 Sleep disorder, unspecified: Secondary | ICD-10-CM | POA: Diagnosis not present

## 2023-02-23 DIAGNOSIS — I130 Hypertensive heart and chronic kidney disease with heart failure and stage 1 through stage 4 chronic kidney disease, or unspecified chronic kidney disease: Secondary | ICD-10-CM | POA: Diagnosis present

## 2023-02-23 DIAGNOSIS — Z433 Encounter for attention to colostomy: Secondary | ICD-10-CM

## 2023-02-23 DIAGNOSIS — Z9049 Acquired absence of other specified parts of digestive tract: Secondary | ICD-10-CM | POA: Diagnosis not present

## 2023-02-23 DIAGNOSIS — Z9889 Other specified postprocedural states: Secondary | ICD-10-CM

## 2023-02-23 DIAGNOSIS — I482 Chronic atrial fibrillation, unspecified: Secondary | ICD-10-CM | POA: Diagnosis not present

## 2023-02-23 DIAGNOSIS — Z933 Colostomy status: Secondary | ICD-10-CM | POA: Diagnosis not present

## 2023-02-23 DIAGNOSIS — G4733 Obstructive sleep apnea (adult) (pediatric): Secondary | ICD-10-CM | POA: Diagnosis present

## 2023-02-23 DIAGNOSIS — I480 Paroxysmal atrial fibrillation: Secondary | ICD-10-CM | POA: Diagnosis present

## 2023-02-23 DIAGNOSIS — Z87442 Personal history of urinary calculi: Secondary | ICD-10-CM | POA: Diagnosis not present

## 2023-02-23 DIAGNOSIS — R55 Syncope and collapse: Secondary | ICD-10-CM | POA: Diagnosis not present

## 2023-02-23 DIAGNOSIS — Z8546 Personal history of malignant neoplasm of prostate: Secondary | ICD-10-CM

## 2023-02-23 DIAGNOSIS — R944 Abnormal results of kidney function studies: Secondary | ICD-10-CM | POA: Diagnosis present

## 2023-02-23 DIAGNOSIS — Z888 Allergy status to other drugs, medicaments and biological substances status: Secondary | ICD-10-CM

## 2023-02-23 DIAGNOSIS — Z85828 Personal history of other malignant neoplasm of skin: Secondary | ICD-10-CM | POA: Diagnosis not present

## 2023-02-23 DIAGNOSIS — I613 Nontraumatic intracerebral hemorrhage in brain stem: Secondary | ICD-10-CM | POA: Diagnosis not present

## 2023-02-23 DIAGNOSIS — Z7189 Other specified counseling: Secondary | ICD-10-CM | POA: Diagnosis not present

## 2023-02-23 DIAGNOSIS — E785 Hyperlipidemia, unspecified: Secondary | ICD-10-CM | POA: Diagnosis present

## 2023-02-23 DIAGNOSIS — R7989 Other specified abnormal findings of blood chemistry: Secondary | ICD-10-CM | POA: Diagnosis not present

## 2023-02-23 DIAGNOSIS — N183 Chronic kidney disease, stage 3 unspecified: Secondary | ICD-10-CM | POA: Diagnosis present

## 2023-02-23 DIAGNOSIS — M40209 Unspecified kyphosis, site unspecified: Secondary | ICD-10-CM | POA: Diagnosis present

## 2023-02-23 DIAGNOSIS — Z87891 Personal history of nicotine dependence: Secondary | ICD-10-CM

## 2023-02-23 DIAGNOSIS — F419 Anxiety disorder, unspecified: Secondary | ICD-10-CM | POA: Diagnosis present

## 2023-02-23 LAB — GLUCOSE, CAPILLARY: Glucose-Capillary: 90 mg/dL (ref 70–99)

## 2023-02-23 MED ORDER — METOPROLOL TARTRATE 12.5 MG HALF TABLET
12.5000 mg | ORAL_TABLET | Freq: Two times a day (BID) | ORAL | Status: DC
  Administered 2023-02-23 – 2023-02-28 (×10): 12.5 mg via ORAL
  Filled 2023-02-23 (×10): qty 1

## 2023-02-23 MED ORDER — ALUM & MAG HYDROXIDE-SIMETH 200-200-20 MG/5ML PO SUSP: 30.0000 mL | ORAL | Status: DC | PRN

## 2023-02-23 MED ORDER — GUAIFENESIN-DM 100-10 MG/5ML PO SYRP: 10.0000 mL | ORAL_SOLUTION | Freq: Four times a day (QID) | ORAL | Status: DC | PRN

## 2023-02-23 MED ORDER — ACETAMINOPHEN 325 MG PO TABS
325.0000 mg | ORAL_TABLET | ORAL | Status: DC | PRN
  Administered 2023-02-26: 650 mg via ORAL
  Administered 2023-02-27: 325 mg via ORAL
  Filled 2023-02-23 (×2): qty 2

## 2023-02-23 MED ORDER — PANTOPRAZOLE SODIUM 40 MG PO TBEC
40.0000 mg | DELAYED_RELEASE_TABLET | Freq: Every day | ORAL | Status: DC
  Administered 2023-02-24 – 2023-02-28 (×5): 40 mg via ORAL
  Filled 2023-02-23 (×5): qty 1

## 2023-02-23 MED ORDER — INFLUENZA VAC A&B SURF ANT ADJ 0.5 ML IM SUSY
0.5000 mL | PREFILLED_SYRINGE | INTRAMUSCULAR | Status: AC
  Administered 2023-02-24: 0.5 mL via INTRAMUSCULAR
  Filled 2023-02-23: qty 0.5

## 2023-02-23 MED ORDER — ADULT MULTIVITAMIN W/MINERALS CH
1.0000 | ORAL_TABLET | Freq: Every day | ORAL | Status: DC
  Administered 2023-02-24 – 2023-02-28 (×5): 1 via ORAL
  Filled 2023-02-23 (×5): qty 1

## 2023-02-23 MED ORDER — ENSURE ENLIVE PO LIQD
237.0000 mL | Freq: Three times a day (TID) | ORAL | Status: DC
  Administered 2023-02-23 – 2023-02-28 (×12): 237 mL via ORAL

## 2023-02-23 MED ORDER — SIMVASTATIN 20 MG PO TABS
20.0000 mg | ORAL_TABLET | Freq: Every day | ORAL | Status: DC
  Administered 2023-02-23 – 2023-02-27 (×5): 20 mg via ORAL
  Filled 2023-02-23 (×5): qty 1

## 2023-02-23 MED ORDER — SORBITOL 70 % SOLN: 30.0000 mL | Freq: Every day | Status: DC | PRN

## 2023-02-23 MED ORDER — ADULT MULTIVITAMIN W/MINERALS CH: 1.0000 | ORAL_TABLET | Freq: Every day | ORAL | Status: DC

## 2023-02-23 MED ORDER — POLYETHYLENE GLYCOL 3350 17 G PO PACK: 17.0000 g | PACK | Freq: Every day | ORAL | Status: DC | PRN

## 2023-02-23 MED ORDER — MELATONIN 5 MG PO TABS
5.0000 mg | ORAL_TABLET | Freq: Every day | ORAL | Status: DC
  Administered 2023-02-23 – 2023-02-27 (×5): 5 mg via ORAL
  Filled 2023-02-23 (×5): qty 1

## 2023-02-23 MED ORDER — ONDANSETRON HCL 4 MG/2ML IJ SOLN: 4.0000 mg | Freq: Four times a day (QID) | INTRAMUSCULAR | Status: DC | PRN

## 2023-02-23 MED ORDER — METHOCARBAMOL 500 MG PO TABS: 500.0000 mg | ORAL_TABLET | Freq: Four times a day (QID) | ORAL | Status: DC | PRN

## 2023-02-23 MED ORDER — METOPROLOL TARTRATE 25 MG PO TABS: 12.5000 mg | ORAL_TABLET | Freq: Two times a day (BID) | ORAL | Status: DC

## 2023-02-23 MED ORDER — GABAPENTIN 600 MG PO TABS: 600.0000 mg | ORAL_TABLET | Freq: Two times a day (BID) | ORAL | Status: AC | PRN

## 2023-02-23 MED ORDER — ONDANSETRON HCL 4 MG PO TABS: 4.0000 mg | ORAL_TABLET | Freq: Four times a day (QID) | ORAL | Status: DC | PRN

## 2023-02-23 MED ORDER — FLEET ENEMA RE ENEM: 1.0000 | ENEMA | Freq: Once | RECTAL | Status: DC | PRN

## 2023-02-23 MED ORDER — ACETAMINOPHEN 500 MG PO TABS: 500.0000 mg | ORAL_TABLET | Freq: Three times a day (TID) | ORAL | Status: DC | PRN

## 2023-02-23 MED ORDER — ENSURE ENLIVE PO LIQD: 237.0000 mL | Freq: Three times a day (TID) | ORAL | Status: DC

## 2023-02-23 NOTE — H&P (Signed)
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  Neurology consulted and MRI of the brain obtained and BP controlled with cleviprex infusion. 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 The possibility of a small ischemic infarct with hemorrhagic conversion possibility of a cavernoma that bled possible.  Maintained on Eliquis for atrial fibrillation which was reversed with Andexxa. He presented also with a degree of delirium.  Anticoagulants and antithrombotics held.  Hypomagnesemia corrected.  Transferred to hospitalist service on 10/09.  He has remained stable.  PT/OT evaluated and he was steady upon standing and utilizes UE support on walker in order to don robe with assistance from therapist and spouse. Pt then ambulated out into the hallway and performed standing exercises in order to improve overall LE strength. 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   Review of Systems  Constitutional:  Negative for fever.  Eyes:  Negative for blurred vision.  Respiratory: Negative.    Cardiovascular: Negative.  Negative for chest pain.  Gastrointestinal:  Negative for nausea and vomiting.       Chronic ostomy, having problems with position/fit of bag  Genitourinary: Negative.   Musculoskeletal: Negative.   Skin:  Negative for rash.  Neurological:  Positive for dizziness and weakness.  Psychiatric/Behavioral:  Negative for depression.    Past Medical History:  Diagnosis Date   Amblyopia of right eye    Anxiety    Aortic atherosclerosis (HCC)    Arthritis    Atrial fibrillation and  flutter (HCC) 05/2018   a.) CHA2DS2VASc = 4 (age x 2, HTN, vascular disease history); b.) rate/rhythm maintained on oral metoprolol tartrate; chronically anticoagulated with dose reduced apixaban (experienced hematuria on rivaroxaban)   B12 deficiency    Cervical spondylosis with myelopathy    Chronic kidney disease, stage 3 (HCC)    DDD (degenerative disc disease), lumbar    Diverticulosis    Esophageal dysphagia    External hemorrhoids    GERD (gastroesophageal reflux disease)    GI bleed    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   History of echocardiogram    a.) TTE 06/07/2018: EF 60-65%, no RWMAs, mild MR, PASP 33 mmHg; b.) TTE 09/19/2019: EF 50-55%, mild LVH, mod RVE, mild LA dil, mild-mod TR, mild MR, PASP 42.5; c.) TTE 09/14/2022: EF 60-65%, mod basal-septal LVH, mild MR/AR, mod TR, AoV sclerosis, Ao root 37 mm   History of GI diverticular bleed    a.) required prolonged ICU admission at [facility]   History of kidney stones    HLD (hyperlipidemia)    Hyperlipidemia    Hypertension    Kyphoscoliosis    Meniscus tear    On apixaban therapy    OSA (obstructive sleep apnea)    a.) does not require nocturnal PAP therapy   Osteopenia    Peripheral neuropathy    Prostate cancer (HCC) 2011   PTSD (post-traumatic stress disorder)    RBBB (right bundle branch block)    SBO (small bowel obstruction) (HCC)    Skin cancer of scalp    Past Surgical History:  Procedure Laterality Date   COLECTOMY  07/12/2018   with  end ileostomy   COLONOSCOPY Left 06/07/2018   Procedure: COLONOSCOPY;  Surgeon: Pasty Spillers, MD;  Location: [facility] ENDOSCOPY;  Service: Endoscopy;  Laterality: Left;   COLONOSCOPY WITH ESOPHAGOGASTRODUODENOSCOPY (EGD)  09/12/2010   COLONOSCOPY WITH PROPOFOL N/A 04/06/2015   Procedure: COLONOSCOPY WITH PROPOFOL;  Surgeon: Christena Deem, MD;  Location: [facility] ENDOSCOPY;  Service: Endoscopy;  Laterality: N/A;   COLONOSCOPY WITH PROPOFOL N/A 06/07/2018   Procedure: COLONOSCOPY  WITH PROPOFOL;  Surgeon: Pasty Spillers, MD;  Location: [facility] ENDOSCOPY;  Service: Endoscopy;  Laterality: N/A;   HAMMER TOE SURGERY Left 01/05/2023   Procedure: HAMMER TOE CORRECTION; INJECTION OF FAT PAD GRAFT;  Surgeon: [name], DPM;  Location: [facility] ORS;  Service: Podiatry;  Laterality: Left;   INGUINAL HERNIA REPAIR Left 09/23/2014   Procedure: HERNIA REPAIR INGUINAL ADULT;  Surgeon: Kieth Brightly, MD;  Location: [facility] ORS;  Service: General;  Laterality: Left;   LASIK  2000   MENISCUS REPAIR Left 05/15/2006   MOHS SURGERY     right scalp   SKIN GRAFT FULL THICKNESS ARM Right 1966   [place]   STRABISMUS SURGERY Right 07/17/2016   VASCULAR SURGERY  05/15/2013   s/p GI Bleed   Family History  Problem Relation Age of Onset   Dementia Mother    Cancer Father    Cancer Brother    Social History:  reports that he quit smoking about 53 years ago. His smoking use included cigarettes. He has been exposed to tobacco smoke. He has never used smokeless tobacco. He reports that he does not drink alcohol and does not use drugs. Allergies:  Allergies  Allergen Reactions   Nsaids Other (See Comments)    Diverticular bleed   Tolmetin     Diverticular bleed   Celecoxib Itching and Rash   Diltiazem Hcl Rash   Venlafaxine Other (See Comments)    sedation   Medications Prior to Admission  Medication Sig Dispense Refill   apixaban (ELIQUIS) 2.5 MG TABS tablet Take 1 tablet (2.5 mg total) by mouth 2 (two) times daily. 180 tablet 3   cyanocobalamin 1000 MCG tablet Take 1,000 mcg by mouth daily.     loperamide (IMODIUM) 2 MG capsule Take 2 mg by mouth daily as needed for diarrhea or loose stools.     metoprolol succinate (TOPROL-XL) 25 MG 24 hr tablet Take 1 tablet (25 mg total) by mouth daily. Take with or immediately following a meal. 90 tablet 3   metoprolol tartrate (LOPRESSOR) 25 MG tablet Take 1 tablet (25 mg total) by mouth daily as needed (take 12.5 mg up to  25 mg as needed for resting heart rate greater then 90.). 90 tablet 3   simvastatin (ZOCOR) 20 MG tablet Take 20 mg by mouth at bedtime.      traMADol (ULTRAM) 50 MG tablet Take 50 mg by mouth every 6 (six) hours as needed for moderate pain.      [DISCONTINUED] acetaminophen (TYLENOL) 500 MG tablet Take 500 mg by mouth daily as needed for fever or pain.      [DISCONTINUED] gabapentin (NEURONTIN) 600 MG tablet Take 600 mg by mouth 4 (four) times daily.        Home: Home Living Family/patient expects to be discharged to:: Private residence Living Arrangements: Alone Available Help at Discharge: Neighbor Type of Home: House Home Access: Stairs to enter [employer] of Steps: 1 (1 step in garage, 1 step in front of house) Entrance Stairs-Rails: Right (vertical  grab bar on R side of garage steps) Home Layout: One level Home Equipment: Agricultural consultant (2 wheels), [employer] - single point Additional Comments: Pt home set up provided via neighbor who was present during evauation  Lives With: Alone   Functional History: Prior Function Prior Level of Function : Independent/Modified Independent Mobility Comments: pts neighbor reports the pt is able to go on walks, cleans his own house, trims shrubs, typically does not use an assistive device ADLs Comments: IND for ileostomy care  Functional Status:  Mobility: Bed Mobility Overal bed mobility: Needs Assistance Bed Mobility: Supine to Sit Supine to sit: Supervision, Used rails, HOB elevated Sit to supine: Supervision General bed mobility comments: pt able to perform without physical assist. Transfers Overall transfer level: Needs assistance Equipment used: Rolling walker (2 wheels) Transfers: Sit to/from Stand Sit to Stand: Supervision General transfer comment: SBA from EOB to sink Ambulation/Gait Ambulation/Gait assistance: Contact guard assist, Supervision Gait Distance (Feet): 200 Feet Assistive device: Rolling walker (2  wheels) Gait Pattern/deviations: Step-through pattern, Decreased step length - right, Decreased step length - left, Staggering left, Staggering right General Gait Details: Pt ambulated 200' and performed standing exercises with UE support within the hallway. Gait velocity: decreased Stairs: Yes Stairs assistance: Contact guard assist, Supervision Stair Management: Two rails, Alternating pattern, Forwards Number of Stairs: 4 General stair comments: Pt demonstrated safe abilities to ascend/decend 4 stair with CGA for safety    ADL: ADL Overall ADL's : Needs assistance/impaired Grooming: Wash/dry face, Applying deodorant, Standing, Supervision/safety Upper Body Bathing: Sitting, Supervision/ safety Lower Body Bathing: Supervison/ safety, Sit to/from stand Upper Body Dressing : Standing, Supervision/safety Toileting- Clothing Manipulation and Hygiene: Supervision/safety Toileting - Clothing Manipulation Details (indicate cue type and reason): stood over toilet in room without UE support to empty ostomy Tub/ Shower Transfer: Contact guard assist, Grab bars, BSC/3in1 Tub/Shower Transfer Details (indicate cue type and reason): sat on BSC in shower Functional mobility during ADLs: Contact guard assist, Supervision/safety General ADL Comments: bathing completed seated on BSC in shower today with CGA for transfer in/out, SUP for bathing tasks  Cognition: Cognition Overall Cognitive Status: Within Functional Limits for tasks assessed Arousal/Alertness: Awake/alert Orientation Level: Oriented X4 Problem Solving: Impaired Problem Solving Impairment:  (unable to recall/demo use of call bell without cues) Cognition Arousal: Alert Behavior During Therapy: WFL for tasks assessed/performed Overall Cognitive Status: Within Functional Limits for tasks assessed Area of Impairment: Orientation, Following commands, Problem solving Orientation Level: Disoriented to, Time, Situation Following Commands:  Follows one step commands with increased time, Follows one step commands inconsistently Problem Solving: Slow processing General Comments: Pt is A and O x 3. pleasant and agreeable. Motivated to improve sio he can return home after rehab.  Physical Exam: Blood pressure 130/77, pulse 74, temperature 97.7 F (36.5 C), resp. rate 18, height 5\' 10"  (1.778 m), weight 72 kg, SpO2 100%. Physical Exam Constitutional:      Appearance: Normal appearance.  HENT:     Head: Normocephalic.     Right Ear: External ear normal.     Left Ear: External ear normal.     Nose: Nose normal.     Mouth/Throat:     Mouth: Mucous membranes are dry.  Eyes:     Extraocular Movements: Extraocular movements intact.     Pupils: Pupils are equal, round, and reactive to light.  Cardiovascular:     Rate and Rhythm: Normal rate and regular rhythm.  Pulmonary:     Effort: Pulmonary effort is normal. No respiratory  distress.     Breath sounds: No wheezing.  Abdominal:     General: Bowel sounds are normal.     Comments: Ostomy sealed, semi-full with loose stool. Bag is in the RLQ and hangs literally between his legs.   Musculoskeletal:     Cervical back: Normal range of motion.  Skin:    General: Skin is warm and dry.  Neurological:     Mental Status: He is alert.     Comments: Alert and oriented x 3. Normal insight and awareness. Intact Memory. Minimal word finding deficits, fairly fluid. Sl dysarthria. Cranial nerve exam unremarkable. MMT: 4/5 BUE prox to distal. BLE 4- HF, 4/5 KE, 4+/5 ADF/PF. No focal sensory findings. Mild decrease in [facility] of BUE. DTR's brisk in the LE's bilaterally. No abnl resting tone.    Psychiatric:        Mood and Affect: Mood normal.        Behavior: Behavior normal.     Results for orders placed or performed during the hospital encounter of 02/14/23 (from the past 48 hour(s))  Glucose, capillary     Status: Abnormal   Collection Time: 02/21/23 11:28 AM  Result Value Ref Range    Glucose-Capillary 102 (H) 70 - 99 mg/dL    Comment: Glucose reference range applies only to samples taken after fasting for at least 8 hours.  Glucose, capillary     Status: None   Collection Time: 02/21/23  4:31 PM  Result Value Ref Range   Glucose-Capillary 98 70 - 99 mg/dL    Comment: Glucose reference range applies only to samples taken after fasting for at least 8 hours.  Glucose, capillary     Status: Abnormal   Collection Time: 02/21/23  7:55 PM  Result Value Ref Range   Glucose-Capillary 116 (H) 70 - 99 mg/dL    Comment: Glucose reference range applies only to samples taken after fasting for at least 8 hours.  Glucose, capillary     Status: None   Collection Time: 02/22/23  7:32 AM  Result Value Ref Range   Glucose-Capillary 84 70 - 99 mg/dL    Comment: Glucose reference range applies only to samples taken after fasting for at least 8 hours.  Glucose, capillary     Status: None   Collection Time: 02/22/23  5:34 PM  Result Value Ref Range   Glucose-Capillary 96 70 - 99 mg/dL    Comment: Glucose reference range applies only to samples taken after fasting for at least 8 hours.  Glucose, capillary     Status: Abnormal   Collection Time: 02/22/23  8:23 PM  Result Value Ref Range   Glucose-Capillary 117 (H) 70 - 99 mg/dL    Comment: Glucose reference range applies only to samples taken after fasting for at least 8 hours.  Glucose, capillary     Status: None   Collection Time: 02/23/23  8:28 AM  Result Value Ref Range   Glucose-Capillary 90 70 - 99 mg/dL    Comment: Glucose reference range applies only to samples taken after fasting for at least 8 hours.   No results found.    Blood pressure 130/77, pulse 74, temperature 97.7 F (36.5 C), resp. rate 18, height 5\' 10"  (1.778 m), weight 72 kg, SpO2 100%.  Medical Problem List and Plan: 1. 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 Unclear etiology. 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 ?small infarct with hemorrhagic  transformation  -patient may  shower  -ELOS/Goals: 5-7 days, mod I with  PT and OT, supervision to mod I with SLP  2.  Antithrombotics: -DVT/anticoagulation:  Mechanical:  Antiembolism stockings, knee (TED hose) Bilateral lower extremities -eliquis held d/t bleed  -antiplatelet therapy: holding also d/t bleed  3. Pain Management: Tylenol as needed  4. Mood/Behavior/Sleep: LCSW to evaluate and provide emotional support  -antipsychotic agents: n/a  5. Neuropsych/cognition: This patient is capable of making decisions on his own behalf.  6. Skin/Wound Care: Routine skin care checks   7. Fluids/Electrolytes/Nutrition: Routine Is and Os and follow-up chemistries  -continue MVI, Ensure  -pt states that his appetite is reasonable  8: Hypertension: monitor TID and prn  -continue metoprolol tartrate 12.5 mg BID  -SBP goal of less than 150 9: Hyperlipidemia: continue statin  10: GERD: continue PPI  11: Paroxysmal atrial fib/flutter: Eliquis held due to ICH; on BB  -on reduced dose of Eliquis due to prior GI bleeding (does not desire Watchman procedure)  -follows with Dr. Mariah Milling  12: CKD stage III: elevated BUN (baseline creatinine 0.8-1.0)  -follow-up BMP Monday  13: Recent left foot surgery: hammer toe correction 8/23  -follow-up Dr. Lilian Kapur  14: History of prostate cancer  15: s/p colectomy/end colostomy at [facility] 06/2018  -consult WOCN as the ostomy was placed when he was 70 lbs heavier. While the ostomy generally stays sealed, it now sits very low on his belly, and the bag literally hangs between his legs. Perhaps some kind of strap or different bag could help.   16: OSO on CPAP ? using ?  17: Chronic diastolic CHF: monitor volume status/daily weight    Milinda Antis, PA-C 02/23/2023

## 2023-02-23 NOTE — Plan of Care (Signed)
  Problem: Education: Goal: Knowledge of disease or condition will improve Outcome: Progressing Goal: Knowledge of secondary prevention will improve (MUST DOCUMENT ALL) Outcome: Progressing Goal: Knowledge of patient specific risk factors will improve (Mark N/A or DELETE if not current risk factor) Outcome: Progressing   Problem: Intracerebral Hemorrhage Tissue Perfusion: Goal: Complications of Intracerebral Hemorrhage will be minimized Outcome: Progressing   Problem: Coping: Goal: Will verbalize positive feelings about self Outcome: Progressing Goal: Will identify appropriate support needs Outcome: Progressing   Problem: Health Behavior/Discharge Planning: Goal: Ability to manage health-related needs will improve Outcome: Progressing Goal: Goals will be collaboratively established with patient/family Outcome: Progressing   Problem: Self-Care: Goal: Ability to participate in self-care as condition permits will improve Outcome: Progressing Goal: Verbalization of feelings and concerns over difficulty with self-care will improve Outcome: Progressing Goal: Ability to communicate needs accurately will improve Outcome: Progressing   Problem: Nutrition: Goal: Risk of aspiration will decrease Outcome: Progressing Goal: Dietary intake will improve Outcome: Progressing   Problem: Education: Goal: Knowledge of General Education information will improve Description: Including pain rating scale, medication(s)/side effects and non-pharmacologic comfort measures Outcome: Progressing   Problem: Health Behavior/Discharge Planning: Goal: Ability to manage health-related needs will improve Outcome: Progressing   Problem: Clinical Measurements: Goal: Ability to maintain clinical measurements within normal limits will improve Outcome: Progressing Goal: Will remain free from infection Outcome: Progressing Goal: Diagnostic test results will improve Outcome: Progressing Goal:  Respiratory complications will improve Outcome: Progressing Goal: Cardiovascular complication will be avoided Outcome: Progressing   Problem: Activity: Goal: Risk for activity intolerance will decrease Outcome: Progressing   Problem: Nutrition: Goal: Adequate nutrition will be maintained Outcome: Progressing   Problem: Coping: Goal: Level of anxiety will decrease Outcome: Progressing   Problem: Elimination: Goal: Will not experience complications related to bowel motility Outcome: Progressing Goal: Will not experience complications related to urinary retention Outcome: Progressing   Problem: Pain Managment: Goal: General experience of comfort will improve Outcome: Progressing   Problem: Safety: Goal: Ability to remain free from injury will improve Outcome: Progressing   Problem: Skin Integrity: Goal: Risk for impaired skin integrity will decrease Outcome: Progressing   

## 2023-02-23 NOTE — Progress Notes (Signed)
Occupational Therapy Treatment Patient Details Name: Ryan Allison MRN: [id number] DOB: 11/19/1940 Today's Date: 02/23/2023   History of present illness [name] is a 82 y.o. 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   OT comments  Pt is supine in bed on arrival. Easily arousable and agreeable to OT session. He denies pain. Pt performed bed mobility SUP/MOD I with no physical assist. Pt required SUP for STS from EOB to sink and for mobility in the room to get to recliner using RW with SBA. He stood x19 mins for ADL session at sink with occasional unilateral support on sink for balance to perform oral care, shaving, and washing his face with SUP. He was fatigued following session, but participated well and seems to be improving each day. Pt left in recliner with all needs in place and will cont to require skilled acute OT services to maximize his safety and IND to return to PLOF.       If plan is discharge home, recommend the following:  Supervision due to cognitive status;Direct supervision/assist for medications management;Direct supervision/assist for financial management;A little help with bathing/dressing/bathroom;A little help with walking and/or transfers;Assist for transportation;Help with stairs or ramp for entrance   Equipment Recommendations  None recommended by OT    Recommendations for Other Services      Precautions / Restrictions Precautions Precautions: Fall Restrictions Weight Bearing Restrictions: No       Mobility Bed Mobility Overal bed mobility: Needs Assistance Bed Mobility: Supine to Sit     Supine to sit: Supervision, Used rails, HOB elevated Sit to  supine: Supervision   General bed mobility comments: pt able to perform without physical assist.    Transfers Overall transfer level: Needs assistance Equipment used: Rolling walker (2 wheels) Transfers: Sit to/from Stand Sit to Stand: Supervision           General transfer comment: SUP from EOB to sink with RW and mobility in the room using RW with SUP/SBA, stood x19 mins during ADL session at sink     Balance Overall balance assessment: Needs assistance Sitting-balance support: Feet supported Sitting balance-Leahy Scale: Good     Standing balance support: During functional activity, Single extremity supported Standing balance-Leahy Scale: Good                             ADL either performed or assessed with clinical judgement   ADL       Grooming: Wash/dry face;Wash/dry hands;Oral care;Standing;Supervision/safety Grooming Details (indicate cue type and reason): shaving as well in standing at sink                                    Extremity/Trunk Assessment Upper Extremity Assessment Upper Extremity Assessment: Generalized weakness   Lower Extremity Assessment Lower Extremity Assessment: Generalized weakness        Vision       Perception     Praxis      Cognition Arousal: Alert Behavior During Therapy: WFL for tasks assessed/performed Overall Cognitive Status: Within Functional Limits for tasks assessed Area of Impairment: Orientation, Following commands, Problem solving  Exercises      Shoulder Instructions       General Comments      Pertinent Vitals/ Pain       Pain Assessment Pain Assessment: No/denies pain  Home Living                                          Prior Functioning/Environment              Frequency  Min 1X/week        Progress Toward Goals  OT Goals(current goals can now be found in the care plan section)   Progress towards OT goals: Progressing toward goals  Acute Rehab OT Goals Patient Stated Goal: get stronger/regain energy OT Goal Formulation: With patient Time For Goal Achievement: 03/02/23 Potential to Achieve Goals: Good  Plan      Co-evaluation                 AM-PAC OT "6 Clicks" Daily Activity     Outcome Measure   Help from another person eating meals?: None Help from another person taking care of personal grooming?: None Help from another person toileting, which includes using toliet, bedpan, or urinal?: A Little Help from another person bathing (including washing, rinsing, drying)?: A Little Help from another person to put on and taking off regular upper body clothing?: None Help from another person to put on and taking off regular lower body clothing?: A Little 6 Click Score: 21    End of Session Equipment Utilized During Treatment: Rolling walker (2 wheels)  OT Visit Diagnosis: Unsteadiness on feet (R26.81);Other abnormalities of gait and mobility (R26.89)   Activity Tolerance Patient tolerated treatment well   Patient Left with call bell/phone within reach;with family/visitor present;in chair   Nurse Communication Mobility status        Time: 0900-0940 OT Time Calculation (min): 40 min  Charges: OT General Charges $OT Visit: 1 Visit OT Treatments $Self Care/Home Management : 38-52 mins  Ryan Allison, OTR/L  02/23/23, 10:56 AM  Ryan Allison 02/23/2023, 10:55 AM

## 2023-02-23 NOTE — TOC Transition Note (Signed)
Transition of Care [facility]) - CM/SW Discharge Note   Patient Details  Name: Ryan Allison MRN: [id number] Date of Birth: April 13, 1941  Transition of [facility] CM/SW Contact:  Liliana Cline, LCSW Phone Number: 02/23/2023, 10:58 AM   Clinical Narrative:    Patient is discharging to [facility] today. CIR Rep Vernona Rieger is coordinating with care team and patient/family.    Final next level of care: IP Rehab Facility Barriers to Discharge: Barriers Resolved   Patient Goals and CMS Choice      Discharge Placement                         Discharge Plan and Services Additional resources added to the After Visit Summary for                                       Social Determinants of Health (SDOH) Interventions SDOH Screenings   Food Insecurity: Patient Unable To Answer (02/16/2023)  Housing: Patient Unable To Answer (02/16/2023)  Transportation Needs: Patient Unable To Answer (02/16/2023)  Utilities: Patient Unable To Answer (02/16/2023)  Depression (PHQ2-9): Medium Risk (02/23/2020)  Financial Resource Strain: Low Risk  (01/11/2023)   Received from [facility]  Tobacco Use: Medium Risk (02/19/2023)     Readmission Risk Interventions     No data to display

## 2023-02-23 NOTE — Progress Notes (Signed)
Inpatient Rehabilitation Admission Medication Review by a Pharmacist  A complete drug regimen review was completed for this patient to identify any potential clinically significant medication issues.  High Risk Drug Classes Is patient taking? Indication by Medication  Antipsychotic No   Anticoagulant No   Antibiotic No   Opioid No   Antiplatelet No   Hypoglycemics/insulin No   Vasoactive Medication Yes Metoprolol - hypertension  Chemotherapy No   Other Yes Pantoprazole - GERD Melatonin - sleep Simvastatin - hyperlipidemia Multivitamin - supplement  PRNs: Acetaminophen - mild pain Maalox - indigestion Guaifenesin-dextromethorphan - cough Methocarbamol - muscle spasms Ondansetron - nausea/vomiting Miralax, sorbitol, Fleets enema - constipation     Type of Medication Issue Identified Description of Issue Recommendation(s)  Drug Interaction(s) (clinically significant)     Duplicate Therapy  Metoprolol succinate and metoprolol tartrate were both filled 02/05/23 Metoprolol succinate discontinued.  Allergy     No Medication Administration End Date     Incorrect Dose     Additional Drug Therapy Needed     Significant med changes from prior encounter (inform family/care partners about these prior to discharge). Apixaban reversed with KCentra on 02/15/23 due to ICH > discontinued. Metoprolol succinate discontinued. Vitamin B-12 discontinued. New Multivitamin, melatonin. SCDs for VTE prophylaxis. Metoprolol tartrate continued.  Communicate changes with patient/family prior to discharge.  Other  Discharge summary indicates plan to resume Gabapentin 600 mg BID prn > not ordered while inpatient and last filled 02/2022.  DC summary also notes plan to resume PRN Tramadol for pain. Has been filling regularly, but was not ordered during inpatient admission.  Monitor for any need to resume prior PRN Tramadol.  And/or gabapentin.    Clinically significant medication issues were  identified that warrant physician communication and completion of prescribed/recommended actions by midnight of the next day:  No  Pharmacist comments:  - no anticoagulants, antithrombotics or antiplatelets - Antiembolism stockings/ TED hose for VTE prophylaxis  Time spent performing this drug regimen review (minutes):  20   Dennie Fetters, [place] 02/23/2023 4:18 PM

## 2023-02-23 NOTE — H&P (Signed)
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  Neurology consulted and MRI of the brain obtained and BP controlled with cleviprex infusion. 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 The possibility of a small ischemic infarct with hemorrhagic conversion possibility of a cavernoma that bled possible.  Maintained on Eliquis for atrial fibrillation which was reversed with Andexxa. He presented also with a degree of delirium.  Anticoagulants and antithrombotics held.  Hypomagnesemia corrected.  Transferred to hospitalist service on 10/09.  He has remained stable.  PT/OT evaluated and he was steady upon standing and utilizes UE support on walker in order to don robe with assistance from therapist and spouse. Pt then ambulated out into the hallway and performed standing exercises in order to improve overall LE strength. 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    Review of Systems  Constitutional:  Negative for fever.  Eyes:  Negative for blurred vision.  Respiratory: Negative.    Cardiovascular: Negative.  Negative for chest pain.  Gastrointestinal:  Negative for nausea and vomiting.       Chronic ostomy, having problems with position/fit of bag  Genitourinary: Negative.   Musculoskeletal: Negative.   Skin:  Negative for rash.  Neurological:  Positive for dizziness and weakness.  Psychiatric/Behavioral:  Negative for depression.         Past Medical History:  Diagnosis Date   Amblyopia of right eye     Anxiety     Aortic atherosclerosis (HCC)     Arthritis     Atrial  fibrillation and flutter (HCC) 05/2018    a.) CHA2DS2VASc = 4 (age x 2, HTN, vascular disease history); b.) rate/rhythm maintained on oral metoprolol tartrate; chronically anticoagulated with dose reduced apixaban (experienced hematuria on rivaroxaban)   B12 deficiency     Cervical spondylosis with myelopathy     Chronic kidney disease, stage 3 (HCC)     DDD (degenerative disc disease), lumbar     Diverticulosis     Esophageal dysphagia     External hemorrhoids     GERD (gastroesophageal reflux disease)     GI bleed     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   History of echocardiogram      a.) TTE 06/07/2018: EF 60-65%, no RWMAs, mild MR, PASP 33 mmHg; b.) TTE 09/19/2019: EF 50-55%, mild LVH, mod RVE, mild LA dil, mild-mod TR, mild MR, PASP 42.5; c.) TTE 09/14/2022: EF 60-65%, mod basal-septal LVH, mild MR/AR, mod TR, AoV sclerosis, Ao root 37 mm   History of GI diverticular bleed      a.) required prolonged ICU admission at [facility]   History of kidney stones     HLD (hyperlipidemia)     Hyperlipidemia     Hypertension     Kyphoscoliosis     Meniscus tear     On apixaban therapy     OSA (obstructive sleep apnea)      a.) does not require nocturnal PAP therapy   Osteopenia     Peripheral neuropathy     Prostate cancer (HCC) 2011   PTSD (post-traumatic stress disorder)  RBBB (right bundle branch block)     SBO (small bowel obstruction) (HCC)     Skin cancer of scalp               Past Surgical History:  Procedure Laterality Date   COLECTOMY   07/12/2018    with end ileostomy   COLONOSCOPY Left 06/07/2018    Procedure: COLONOSCOPY;  Surgeon: Pasty Spillers, MD;  Location: [facility] ENDOSCOPY;  Service: Endoscopy;  Laterality: Left;   COLONOSCOPY WITH ESOPHAGOGASTRODUODENOSCOPY (EGD)   09/12/2010   COLONOSCOPY WITH PROPOFOL N/A 04/06/2015    Procedure: COLONOSCOPY WITH PROPOFOL;  Surgeon: Christena Deem, MD;  Location: [facility] ENDOSCOPY;  Service: Endoscopy;  Laterality: N/A;    COLONOSCOPY WITH PROPOFOL N/A 06/07/2018    Procedure: COLONOSCOPY WITH PROPOFOL;  Surgeon: Pasty Spillers, MD;  Location: [facility] ENDOSCOPY;  Service: Endoscopy;  Laterality: N/A;   HAMMER TOE SURGERY Left 01/05/2023    Procedure: HAMMER TOE CORRECTION; INJECTION OF FAT PAD GRAFT;  Surgeon: [name], DPM;  Location: [facility] ORS;  Service: Podiatry;  Laterality: Left;   INGUINAL HERNIA REPAIR Left 09/23/2014    Procedure: HERNIA REPAIR INGUINAL ADULT;  Surgeon: Kieth Brightly, MD;  Location: [facility] ORS;  Service: General;  Laterality: Left;   LASIK   2000   MENISCUS REPAIR Left 05/15/2006   MOHS SURGERY        right scalp   SKIN GRAFT FULL THICKNESS ARM Right 1966    [place] mine   STRABISMUS SURGERY Right 07/17/2016   VASCULAR SURGERY   05/15/2013    s/p GI Bleed             Family History  Problem Relation Age of Onset   Dementia Mother     Cancer Father     Cancer Brother          Social History:  reports that he quit smoking about 53 years ago. His smoking use included cigarettes. He has been exposed to tobacco smoke. He has never used smokeless tobacco. He reports that he does not drink alcohol and does not use drugs. Allergies:  Allergies       Allergies  Allergen Reactions   Nsaids Other (See Comments)      Diverticular bleed   Tolmetin        Diverticular bleed   Celecoxib Itching and Rash   Diltiazem Hcl Rash   Venlafaxine Other (See Comments)      sedation            Medications Prior to Admission  Medication Sig Dispense Refill   apixaban (ELIQUIS) 2.5 MG TABS tablet Take 1 tablet (2.5 mg total) by mouth 2 (two) times daily. 180 tablet 3   cyanocobalamin 1000 MCG tablet Take 1,000 mcg by mouth daily.       loperamide (IMODIUM) 2 MG capsule Take 2 mg by mouth daily as needed for diarrhea or loose stools.       metoprolol succinate (TOPROL-XL) 25 MG 24 hr tablet Take 1 tablet (25 mg total) by mouth daily. Take with or immediately following a  meal. 90 tablet 3   metoprolol tartrate (LOPRESSOR) 25 MG tablet Take 1 tablet (25 mg total) by mouth daily as needed (take 12.5 mg up to 25 mg as needed for resting heart rate greater then 90.). 90 tablet 3   simvastatin (ZOCOR) 20 MG tablet Take 20 mg by mouth at bedtime.        traMADol (ULTRAM) 50  MG tablet Take 50 mg by mouth every 6 (six) hours as needed for moderate pain.        [DISCONTINUED] acetaminophen (TYLENOL) 500 MG tablet Take 500 mg by mouth daily as needed for fever or pain.        [DISCONTINUED] gabapentin (NEURONTIN) 600 MG tablet Take 600 mg by mouth 4 (four) times daily.                  Home: Home Living Family/patient expects to be discharged to:: Private residence Living Arrangements: Alone Available Help at Discharge: Neighbor Type of Home: House Home Access: Stairs to enter [employer] of Steps: 1 (1 step in garage, 1 step in front of house) Entrance Stairs-Rails: Right (vertical grab bar on R side of garage steps) Home Layout: One level Home Equipment: Agricultural consultant (2 wheels), [employer] - single point Additional Comments: Pt home set up provided via neighbor who was present during evauation  Lives With: Alone   Functional History: Prior Function Prior Level of Function : Independent/Modified Independent Mobility Comments: pts neighbor reports the pt is able to go on walks, cleans his own house, trims shrubs, typically does not use an assistive device ADLs Comments: IND for ileostomy care   Functional Status:  Mobility: Bed Mobility Overal bed mobility: Needs Assistance Bed Mobility: Supine to Sit Supine to sit: Supervision, Used rails, HOB elevated Sit to supine: Supervision General bed mobility comments: pt able to perform without physical assist. Transfers Overall transfer level: Needs assistance Equipment used: Rolling walker (2 wheels) Transfers: Sit to/from Stand Sit to Stand: Supervision General transfer comment: SBA from EOB to  sink Ambulation/Gait Ambulation/Gait assistance: Contact guard assist, Supervision Gait Distance (Feet): 200 Feet Assistive device: Rolling walker (2 wheels) Gait Pattern/deviations: Step-through pattern, Decreased step length - right, Decreased step length - left, Staggering left, Staggering right General Gait Details: Pt ambulated 200' and performed standing exercises with UE support within the hallway. Gait velocity: decreased Stairs: Yes Stairs assistance: Contact guard assist, Supervision Stair Management: Two rails, Alternating pattern, Forwards Number of Stairs: 4 General stair comments: Pt demonstrated safe abilities to ascend/decend 4 stair with CGA for safety   ADL: ADL Overall ADL's : Needs assistance/impaired Grooming: Wash/dry face, Applying deodorant, Standing, Supervision/safety Upper Body Bathing: Sitting, Supervision/ safety Lower Body Bathing: Supervison/ safety, Sit to/from stand Upper Body Dressing : Standing, Supervision/safety Toileting- Clothing Manipulation and Hygiene: Supervision/safety Toileting - Clothing Manipulation Details (indicate cue type and reason): stood over toilet in room without UE support to empty ostomy Tub/ Shower Transfer: Contact guard assist, Grab bars, BSC/3in1 Tub/Shower Transfer Details (indicate cue type and reason): sat on BSC in shower Functional mobility during ADLs: Contact guard assist, Supervision/safety General ADL Comments: bathing completed seated on BSC in shower today with CGA for transfer in/out, SUP for bathing tasks   Cognition: Cognition Overall Cognitive Status: Within Functional Limits for tasks assessed Arousal/Alertness: Awake/alert Orientation Level: Oriented X4 Problem Solving: Impaired Problem Solving Impairment:  (unable to recall/demo use of call bell without cues) Cognition Arousal: Alert Behavior During Therapy: WFL for tasks assessed/performed Overall Cognitive Status: Within Functional Limits for tasks  assessed Area of Impairment: Orientation, Following commands, Problem solving Orientation Level: Disoriented to, Time, Situation Following Commands: Follows one step commands with increased time, Follows one step commands inconsistently Problem Solving: Slow processing General Comments: Pt is A and O x 3. pleasant and agreeable. Motivated to improve sio he can return home after rehab.   Physical Exam: Blood pressure 130/77,  pulse 74, temperature 97.7 F (36.5 C), resp. rate 18, height 5\' 10"  (1.778 m), weight 72 kg, SpO2 100%. Physical Exam Constitutional:      Appearance: Normal appearance.  HENT:     Head: Normocephalic.     Right Ear: External ear normal.     Left Ear: External ear normal.     Nose: Nose normal.     Mouth/Throat:     Mouth: Mucous membranes are dry.  Eyes:     Extraocular Movements: Extraocular movements intact.     Pupils: Pupils are equal, round, and reactive to light.  Cardiovascular:     Rate and Rhythm: Normal rate and regular rhythm.  Pulmonary:     Effort: Pulmonary effort is normal. No respiratory distress.     Breath sounds: No wheezing.  Abdominal:     General: Bowel sounds are normal.     Comments: Ostomy sealed, semi-full with loose stool. Bag is in the RLQ and hangs literally between his legs.   Musculoskeletal:     Cervical back: Normal range of motion.  Skin:    General: Skin is warm and dry.  Neurological:     Mental Status: He is alert.     Comments: Alert and oriented x 3. Normal insight and awareness. Intact Memory. Minimal word finding deficits, fairly fluid. Sl dysarthria. Cranial nerve exam unremarkable. MMT: 4/5 BUE prox to distal. BLE 4- HF, 4/5 KE, 4+/5 ADF/PF. No focal sensory findings. Mild decrease in [facility] of BUE. DTR's brisk in the LE's bilaterally. No abnl resting tone.    Psychiatric:        Mood and Affect: Mood normal.        Behavior: Behavior normal.        Lab Results Last 48 Hours        Results for orders placed or  performed during the hospital encounter of 02/14/23 (from the past 48 hour(s))  Glucose, capillary     Status: Abnormal    Collection Time: 02/21/23 11:28 AM  Result Value Ref Range    Glucose-Capillary 102 (H) 70 - 99 mg/dL      Comment: Glucose reference range applies only to samples taken after fasting for at least 8 hours.  Glucose, capillary     Status: None    Collection Time: 02/21/23  4:31 PM  Result Value Ref Range    Glucose-Capillary 98 70 - 99 mg/dL      Comment: Glucose reference range applies only to samples taken after fasting for at least 8 hours.  Glucose, capillary     Status: Abnormal    Collection Time: 02/21/23  7:55 PM  Result Value Ref Range    Glucose-Capillary 116 (H) 70 - 99 mg/dL      Comment: Glucose reference range applies only to samples taken after fasting for at least 8 hours.  Glucose, capillary     Status: None    Collection Time: 02/22/23  7:32 AM  Result Value Ref Range    Glucose-Capillary 84 70 - 99 mg/dL      Comment: Glucose reference range applies only to samples taken after fasting for at least 8 hours.  Glucose, capillary     Status: None    Collection Time: 02/22/23  5:34 PM  Result Value Ref Range    Glucose-Capillary 96 70 - 99 mg/dL      Comment: Glucose reference range applies only to samples taken after fasting for at least 8 hours.  Glucose, capillary  Status: Abnormal    Collection Time: 02/22/23  8:23 PM  Result Value Ref Range    Glucose-Capillary 117 (H) 70 - 99 mg/dL      Comment: Glucose reference range applies only to samples taken after fasting for at least 8 hours.  Glucose, capillary     Status: None    Collection Time: 02/23/23  8:28 AM  Result Value Ref Range    Glucose-Capillary 90 70 - 99 mg/dL      Comment: Glucose reference range applies only to samples taken after fasting for at least 8 hours.      Imaging Results (Last 48 hours)  No results found.         Blood pressure 130/77, pulse 74, temperature  97.7 F (36.5 C), resp. rate 18, height 5\' 10"  (1.778 m), weight 72 kg, SpO2 100%.   Medical Problem List and Plan: 1. 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 Unclear etiology. 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 ?small infarct with hemorrhagic transformation             -patient may  shower             -ELOS/Goals: 5-7 days, mod I with PT and OT, supervision to mod I with SLP   2.  Antithrombotics: -DVT/anticoagulation:  Mechanical:  Antiembolism stockings, knee (TED hose) Bilateral lower extremities -eliquis held d/t bleed             -antiplatelet therapy: holding also d/t bleed   3. Pain Management: Tylenol as needed   4. Mood/Behavior/Sleep: LCSW to evaluate and provide emotional support             -antipsychotic agents: n/a   5. Neuropsych/cognition: This patient is capable of making decisions on his own behalf.   6. Skin/Wound Care: Routine skin care checks   7. Fluids/Electrolytes/Nutrition: Routine Is and Os and follow-up chemistries             -continue MVI, Ensure             -pt states that his appetite is reasonable   8: Hypertension: monitor TID and prn             -continue metoprolol tartrate 12.5 mg BID             -SBP goal of less than 150 9: Hyperlipidemia: continue statin   10: GERD: continue PPI   11: Paroxysmal atrial fib/flutter: Eliquis held due to ICH; on BB             -on reduced dose of Eliquis due to prior GI bleeding (does not desire Watchman procedure)             -follows with Dr. Mariah Milling   12: CKD stage III: elevated BUN (baseline creatinine 0.8-1.0)             -follow-up BMP Monday   13: Recent left foot surgery: hammer toe correction 8/23             -follow-up Dr. Lilian Kapur   14: History of prostate cancer   15: s/p colectomy/end colostomy at [facility] 06/2018             -consult WOCN as the ostomy was placed when he was 70 lbs heavier. While the ostomy generally stays sealed, it now sits very low on his belly, and the bag  literally hangs between his legs. Perhaps some kind of strap or different bag could help.    16: OSO on CPAP ?  using ?   17: Chronic diastolic CHF: monitor volume status/daily weight     Milinda Antis, PA-C 02/23/2023  I have personally performed a face to face diagnostic evaluation of this patient and formulated the key components of the plan.  Additionally, I have personally reviewed laboratory data, imaging studies, as well as relevant notes and concur with the physician assistant's documentation above.  The patient's status has not changed from the original H&P.  Any changes in documentation from the acute care chart have been noted above.  Ranelle Oyster, MD, [place]

## 2023-02-23 NOTE — Plan of Care (Signed)
  Problem: Consults Goal: RH STROKE PATIENT EDUCATION Description: See Patient Education module for education specifics  Outcome: Progressing   Problem: RH BOWEL ELIMINATION Goal: RH STG MANAGE BOWEL WITH ASSISTANCE Description: STG Manage Bowel with mod I  Assistance. Outcome: Progressing   Problem: RH SAFETY Goal: RH STG ADHERE TO SAFETY PRECAUTIONS W/ASSISTANCE/DEVICE Description: STG Adhere to Safety Precautions With cues Assistance/Device. Outcome: Progressing   Problem: RH PAIN MANAGEMENT Goal: RH STG PAIN MANAGED AT OR BELOW PT'S PAIN GOAL Description: < 4 with prns Outcome: Progressing   Problem: RH KNOWLEDGE DEFICIT Goal: RH STG INCREASE KNOWLEDGE OF HYPERTENSION Description: Patient and friend will be able to manage HTN with medications and dietary modifications using educational resources independently Outcome: Progressing Goal: RH STG INCREASE KNOWLEGDE OF HYPERLIPIDEMIA Description: Patient and friend will be able to manage HLD with medications and dietary modifications using educational resources independently Outcome: Progressing Goal: RH STG INCREASE KNOWLEDGE OF STROKE PROPHYLAXIS Description: Patient and friend will be able to manage secondary risks with medications and dietary modifications using educational resources independently Outcome: Progressing

## 2023-02-23 NOTE — Discharge Summary (Signed)
Physician Discharge Summary  Patient: Ryan Allison LKG:[id number] DOB: [birth year]   Code Status: Full Code Admit date: 02/14/2023 Discharge date: 02/23/2023 Disposition:  CIR ,  PCP: Lynnea Ferrier, MD  Recommendations for Outpatient Follow-up:  Follow up with CIR Follow up with neurology Regarding stroke follow up and discussing anticoagulation re-initiation if appropriate for afib  Discharge Diagnoses:  Principal Problem:   Pontine hemorrhage (HCC) Active Problems:   Near syncope   Paroxysmal atrial fibrillation (HCC)   Hyperlipidemia   Hypomagnesemia   HTN (hypertension)   Hypoglycemia   ICH (intracerebral hemorrhage) (HCC)   Elevated BUN   Malnutrition of moderate degree  Brief Hospital Course Summary: 82 yo M with PMHx of 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 Of note patient on a reduced dose of Eliquis at 2.5 mg BID due to history of diverticular bleeding and hematuria. Patient has declined Watchman device/ cardioversion previously.   They presented to [facility] ED on 02/14/2023 for evaluation of near syncopal episodes with diaphoresis, dizziness with weakness and dry heaving.     ED course: alert and oriented with stable vitals in Atrial flutter. Labs reassuring with the exception of hypomagnesemia. Imaging revealed small pontine ICH. NSUR alerted, patient not a candidate for surgical intervention at this time. Neurology recommended admission to ICU with strict BP control 130-150 and follow up imaging to monitor ICH overnight. Eliquis reversed. Started on cleviprex drip for HTN.  Initially admitted to ICU.  TRH service assumed care 02/17/2023.   10/9-10/11- patient has remained stable since transfer to [facility] service. PT/OT evaluated and recommended acute rehab. He has been approved for CIR and was accepted today.  Maintain strict blood pressure control <150 SBP. Metoprolol dose was changed  as listed below. Discuss with neurology at follow up for appropriate timing of restarting anticoagulation.   All other chronic conditions were treated with home medications.   Discharge Condition: Stable, improved Recommended discharge diet: Cardiac diet  Consultations: Neurology   Procedures/Studies: None   Discharge Instructions     Ambulatory referral to Neurology   Complete by: As directed    An appointment is requested in approximately: 2 weeks      Allergies as of 02/23/2023       Reactions   Nsaids Other (See Comments)   Diverticular bleed   Tolmetin    Diverticular bleed   Celecoxib Itching, Rash   Diltiazem Hcl Rash   Venlafaxine Other (See Comments)   sedation        Medication List     STOP taking these medications    apixaban 2.5 MG Tabs tablet Commonly known as: Eliquis   cyanocobalamin 1000 MCG tablet   metoprolol succinate 25 MG 24 hr tablet Commonly known as: TOPROL-XL       TAKE these medications    acetaminophen 500 MG tablet Commonly known as: TYLENOL Take 1 tablet (500 mg total) by mouth every 8 (eight) hours as needed. What changed:  when to take this reasons to take this   feeding supplement Liqd Take 237 mLs by mouth 3 (three) times daily between meals.   gabapentin 600 MG tablet Commonly known as: NEURONTIN Take 1 tablet (600 mg total) by mouth 2 (two) times daily as needed. What changed:  when to take this reasons to take this   loperamide 2 MG capsule Commonly known as: IMODIUM Take 2 mg by mouth daily as needed  for diarrhea or loose stools.   metoprolol tartrate 25 MG tablet Commonly known as: LOPRESSOR Take 0.5 tablets (12.5 mg total) by mouth 2 (two) times daily. What changed:  how much to take when to take this reasons to take this   multivitamin with minerals Tabs tablet Take 1 tablet by mouth daily. Start taking on: February 24, 2023   simvastatin 20 MG tablet Commonly known as: ZOCOR Take 20 mg  by mouth at bedtime.   traMADol 50 MG tablet Commonly known as: ULTRAM Take 50 mg by mouth every 6 (six) hours as needed for moderate pain.       Subjective   Pt reports being uncomfortable in bed. He is unhappy overall. No specific complaints. Wants to get out of the hospital.   All questions and concerns were addressed at time of discharge.  Objective  Blood pressure 130/77, pulse 74, temperature 97.7 F (36.5 C), resp. rate 18, height 5\' 10"  (1.778 m), weight 72 kg, SpO2 100%.   General: Pt is alert, awake, not in acute distress. Tired appearing. Excessive flexure in cervical spine Cardiovascular: RRR, S1/S2 +, no rubs, no gallops Respiratory: CTA bilaterally, no wheezing, no rhonchi Abdominal: Soft, NT, ND, bowel sounds + Extremities: no edema, no cyanosis  The results of significant diagnostics from this hospitalization (including imaging, microbiology, ancillary and laboratory) are listed below for reference.   Imaging studies: MR BRAIN WO CONTRAST  Addendum Date: 02/15/2023   ADDENDUM REPORT: 02/15/2023 16:03 ADDENDUM: On further review, the finding in the pons is suspicious for acute blood, possibly into a pre-existing cavernoma. This was discussed with Dr Amada Jupiter at 3:50 pm. Electronically Signed   By: Lesia Hausen M.D.   On: 02/15/2023 16:03   Result Date: 02/15/2023 CLINICAL DATA:  Near-syncope apheresis, dizziness, weakness, dry heaving EXAM: MRI HEAD WITHOUT CONTRAST TECHNIQUE: Multiplanar, multiecho pulse sequences of the brain and surrounding structures were obtained without intravenous contrast. COMPARISON:  Same-day head CT FINDINGS: Brain: There is a small focus diffusion restriction in the right cerebellar hemisphere without definite corresponding FLAIR signal abnormality consistent with acute infarct. There is no other evidence of acute infarct There is no acute intracranial hemorrhage or extra-axial fluid collection. Parenchymal volume is within expected limits  for age. The ventricles are normal in size. There is a 1.1 cm focus of signal abnormality in the pons likely reflecting a cavernoma. There is no significant surrounding edema and no mass effect. There is a small focus of cortical encephalomalacia in the left anterior temporal lobe and moderate background chronic small-vessel ischemic change elsewhere in the supratentorial white matter. The pituitary and suprasellar region are normal there is no solid mass lesion. There is no midline shift. Vascular: Normal flow voids. Skull and upper cervical spine: Normal marrow signal. Sinuses/Orbits: The paranasal sinuses are clear. The globes and orbits are unremarkable. Other: The mastoid air cells and middle ear cavities are clear. IMPRESSION: 1. Small acute infarct in the right cerebellar hemisphere. 2. Small focus of cortical encephalomalacia in the left anterior temporal lobe and moderate background chronic small-vessel ischemic changes. 3. 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 Electronically Signed: By: Lesia Hausen M.D. On: 02/15/2023 15:39   CT HEAD WO CONTRAST ( )  Result Date: 02/15/2023 CLINICAL DATA:  Stroke follow-up.  ICH EXAM: CT HEAD WITHOUT CONTRAST TECHNIQUE: Contiguous axial images were obtained from the base of the skull through the vertex without intravenous contrast. RADIATION DOSE REDUCTION: This exam was performed according to the departmental dose-optimization program which includes automated  exposure control, adjustment of the mA and/or kV according to patient size and/or use of iterative reconstruction technique. COMPARISON:  Yesterday FINDINGS: Brain: Ventral and midline pontine hemorrhage has an unchanged size and shape when allowing for superimposed streak artifact, up to 1 cm in craniocaudal span. No detected adjacent edema or swelling. There is a background of chronic small vessel ischemia in the cerebral white matter. Brain volume is preserved for age. No evidence of acute gray matter infarct, mass, or  extra-axial collection. Vascular: No hyperdense vessel or unexpected calcification. Skull: Normal. Negative for fracture or focal lesion. Sinuses/Orbits: No acute finding. IMPRESSION: Unchanged acute hemorrhage in the pons. Electronically Signed   By: Tiburcio Pea M.D.   On: 02/15/2023 04:57   CT ANGIO HEAD NECK W WO CM  Result Date: 02/15/2023 CLINICAL DATA:  Pontine hemorrhage EXAM: CT ANGIOGRAPHY HEAD AND NECK WITH AND WITHOUT CONTRAST TECHNIQUE: Multidetector CT imaging of the head and neck was performed using the standard protocol during bolus administration of intravenous contrast. Multiplanar CT image reconstructions and MIPs were obtained to evaluate the vascular anatomy. Carotid stenosis measurements (when applicable) are obtained utilizing NASCET criteria, using the distal internal carotid diameter as the denominator. RADIATION DOSE REDUCTION: This exam was performed according to the departmental dose-optimization program which includes automated exposure control, adjustment of the mA and/or kV according to patient size and/or use of iterative reconstruction technique. CONTRAST:  75mL OMNIPAQUE IOHEXOL 350 MG/ML SOLN COMPARISON:  Head CT 02/14/2023 FINDINGS: CTA NECK FINDINGS SKELETON: No acute abnormality or high grade bony spinal canal stenosis. OTHER NECK: Normal pharynx, larynx and major salivary glands. No cervical lymphadenopathy. Unremarkable thyroid gland. UPPER CHEST: No pneumothorax or pleural effusion. No nodules or masses. AORTIC ARCH: There is calcific atherosclerosis of the aortic arch. Normal variant aortic arch branching pattern with the left vertebral artery arising independently from the aortic arch. RIGHT CAROTID SYSTEM: Minimal atherosclerotic calcification at the carotid bifurcation. No dissection, occlusion or hemodynamically significant stenosis. LEFT CAROTID SYSTEM: No dissection, occlusion or aneurysm. Mild atherosclerotic calcification at the carotid bifurcation without  hemodynamically significant stenosis. VERTEBRAL ARTERIES: Right dominant configuration. There is no dissection, occlusion or flow-limiting stenosis to the skull base (V1-V3 segments). CTA HEAD FINDINGS POSTERIOR CIRCULATION: --Vertebral arteries: Left vertebral artery terminates in PICA. Normal right. --Inferior cerebellar arteries: Normal. --Basilar artery: Normal. --Superior cerebellar arteries: Normal. --Posterior cerebral arteries (PCA): Normal. ANTERIOR CIRCULATION: --Intracranial internal carotid arteries: Atherosclerotic calcification of the internal carotid arteries at the skull base without hemodynamically significant stenosis. --Anterior cerebral arteries (ACA): Normal. --Middle cerebral arteries (MCA): Normal. VENOUS SINUSES: As permitted by contrast timing, patent. ANATOMIC VARIANTS: Bilateral fetal predominant origins of the posterior cerebral arteries. Delayed phase images show unchanged size of pontine intraparenchymal hematoma. Review of the MIP images confirms the above findings. IMPRESSION: 1. No emergent large vessel occlusion or hemodynamically significant stenosis of the head or neck. 2. Mild bilateral carotid bifurcation atherosclerosis without hemodynamically significant stenosis. 3. Unchanged size of pontine intraparenchymal hematoma. Aortic Atherosclerosis (ICD10-I70.0). Electronically Signed   By: Deatra Robinson M.D.   On: 02/15/2023 00:09   US Venous Img Lower Bilateral (DVT)  Result Date: 02/14/2023 CLINICAL DATA:  Leg swelling EXAM: Bilateral LOWER EXTREMITY VENOUS DOPPLER ULTRASOUND TECHNIQUE: Gray-scale sonography with compression, as well as color and duplex ultrasound, were performed to evaluate the deep venous system(s) from the level of the common femoral vein through the popliteal and proximal calf veins. COMPARISON:  None Available. FINDINGS: VENOUS Normal compressibility of the common femoral, superficial femoral,  and popliteal veins, as well as the visualized calf veins.  Visualized portions of profunda femoral vein and great saphenous vein unremarkable. No filling defects to suggest DVT on grayscale or color Doppler imaging. Doppler waveforms show normal direction of venous flow, normal respiratory plasticity and response to augmentation. Limited views of the contralateral common femoral vein are unremarkable. OTHER None. Limitations: none IMPRESSION: Negative. Electronically Signed   By: Minerva Fester M.D.   On: 02/14/2023 23:43   CT HEAD WO CONTRAST ( )  Result Date: 02/14/2023 CLINICAL DATA:  Syncope EXAM: CT HEAD WITHOUT CONTRAST TECHNIQUE: Contiguous axial images were obtained from the base of the skull through the vertex without intravenous contrast. RADIATION DOSE REDUCTION: This exam was performed according to the departmental dose-optimization program which includes automated exposure control, adjustment of the mA and/or kV according to patient size and/or use of iterative reconstruction technique. COMPARISON:  None Available. FINDINGS: Brain: Small acute pontine hemorrhage measuring approximately 8 x 5 mm. There is periventricular hypoattenuation compatible with chronic microvascular disease. No extra-axial collection. Vascular: No hyperdense vessel or unexpected calcification. Skull: Normal. Negative for fracture or focal lesion. Sinuses/Orbits: No acute finding. Other: None. IMPRESSION: Small acute pontine hemorrhage measuring approximately 8 x 5 mm. Critical Value/emergent results were called by telephone at the time of interpretation on 02/14/2023 at 11:10 pm to Dr. Erma Heritage, Who verbally acknowledged these results. Electronically Signed   By: Deatra Robinson M.D.   On: 02/14/2023 23:10   CT Angio Chest Pulmonary Embolism (PE) W or WO Contrast  Result Date: 02/14/2023 CLINICAL DATA:  Diaphoresis and dizziness, initial encounter EXAM: CT ANGIOGRAPHY CHEST WITH CONTRAST TECHNIQUE: Multidetector CT imaging of the chest was performed using the standard protocol  during bolus administration of intravenous contrast. Multiplanar CT image reconstructions and MIPs were obtained to evaluate the vascular anatomy. RADIATION DOSE REDUCTION: This exam was performed according to the departmental dose-optimization program which includes automated exposure control, adjustment of the mA and/or kV according to patient size and/or use of iterative reconstruction technique. CONTRAST:  OMNIPAQUE IOHEXOL 350 MG/ML SOLN COMPARISON:  None Available. FINDINGS: Cardiovascular: Thoracic aorta shows atherosclerotic calcifications without aneurysmal dilatation. No aortic opacification is identified to a evaluate for dissection. The pulmonary artery shows a normal branching pattern bilaterally. No filling defect to suggest pulmonary embolism is noted. Coronary calcifications are seen. The heart is mildly prominent. Mediastinum/Nodes: Thoracic inlet is within normal limits. No hilar or mediastinal adenopathy is noted. The esophagus as visualized is within normal limits. Lungs/Pleura: Lungs are well aerated bilaterally. Mild interstitial fibrotic changes are noted. No focal infiltrate or effusion is seen. No parenchymal nodules are seen. Upper Abdomen: Visualized upper abdomen is within normal limits with the exception of a nonobstructing 8 mm stone in the upper pole of the left kidney. Musculoskeletal: Mild pectus excavatum deformity is seen. No acute rib abnormality is noted. Degenerative changes of the thoracic spine are seen. Review of the MIP images confirms the above findings. IMPRESSION: No evidence of pulmonary emboli. 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 8 mm left upper pole renal stone. Electronically Signed   By: Alcide Clever M.D.   On: 02/14/2023 22:54   DG Foot Complete Left  Result Date: 02/14/2023 Please see detailed radiograph report in office note.   Labs: Basic Metabolic Panel: Recent Labs  Lab 02/17/23 0531 02/18/23 0517 02/19/23 0436  02/20/23 0427 02/21/23 0453  NA 134* 132* 134* 135 135  K 3.8 3.6 4.6 4.5 5.1  CL 100 99 101 100  99  CO2 27 24 25 24 26   GLUCOSE 102* 97 94 106* 105*  BUN 25* 22 37* 50* 44*  CREATININE 1.07 1.02 1.17 1.09 1.04  CALCIUM 9.0 8.6* 9.3 9.5 9.4  MG  --  1.5* 2.1 2.1  --   PHOS 2.6 3.0 4.0 4.1  --    CBC: Recent Labs  Lab 02/17/23 0531 02/18/23 0517 02/20/23 0427 02/21/23 0453  WBC 7.7 7.1 8.9 8.6  HGB 14.8 13.8 14.6 14.6  HCT 42.5 39.3 41.4 41.7  MCV 95.9 96.3 94.1 96.3  PLT 165 158 165 170   Microbiology: Results for orders placed or performed during the hospital encounter of 02/14/23  MRSA Next Gen by PCR, Nasal     Status: None   Collection Time: 02/15/23  1:59 AM   Specimen: Nasal Mucosa; Nasal Swab  Result Value Ref Range Status   MRSA by PCR Next Gen NOT DETECTED NOT DETECTED Final    Comment: (NOTE) The GeneXpert MRSA Assay (FDA approved for NASAL specimens only), is one component of a comprehensive MRSA colonization surveillance program. It is not intended to diagnose MRSA infection nor to guide or monitor treatment for MRSA infections. Test performance is not FDA approved in patients less than 25 years old. Performed at [facility], [address] [place], [place] [address]    Time coordinating discharge: Over 30 minutes  Leeroy Bock, MD  [facility] 02/23/2023, 10:22 AM

## 2023-02-23 NOTE — Progress Notes (Signed)
PMR Admission Coordinator Pre-Admission Assessment   Patient: Ryan Allison is an 82 y.o., male MRN: [id number] DOB: 1940-09-19 Height: 5\' 10"  (177.8 cm) Weight: 65.7 kg   Insurance Information HMO:     PPO:      PCP:      IPA:      80/20: yes     OTHER:  PRIMARY: Medicare AB      Policy#: [id number]      Subscriber:  Phone#: Verified online    Fax#:  Pre-Cert#:       Employer:  Benefits:  Phone #:      Name:  Eff. Date: Parts A  and B effective 12/14/2007 Deduct: $1632      Out of Pocket Max:  None      Life Max: N/A  CIR: 100%      SNF: 100 days Outpatient: 80%     Co-Pay: 20% Home Health: 100%      Co-Pay: none DME: 80%     Co-Pay: 20% Providers: patient's choice SECONDARY: Tricare for Life       Policy#: [id number]      Phone#:    Financial Counselor:       Phone#:    The "Data Collection Information Summary" for patients in Inpatient Rehabilitation Facilities with attached "Privacy Act Statement-Health Care Records" was provided and verbally reviewed with: Patient and Family   Emergency Contact Information Contact Information       Name Relation Home Work Mobile    [place],Christopher Son [phone]        Barton Fanny     [phone]         Other Contacts   None on File        Current Medical History  Patient Admitting Diagnosis: CVA History of Present Illness:of A-flutter on Eliquis, diverticular bleed, prostate cancer s/p RT, OSA (not on CPAP), Anxiety, HTN, HLD, prior tobacco use who presented to [facility] ED on 02/14/2023 for evaluation of near syncopal episodes with diaphoresis, dizziness with weakness and dry heaving.  Upon arrival patient alert and oriented with stable vitals in Atrial flutter. Labs reassuring with the exception of hypomagnesemia. Imaging revealed small pontine ICH. NSUR alerted, patient not a candidate for surgical intervention at this time, recommending Neurology consultation. Neurology recommended admission to ICU with strict BP control  130-150 and follow up imaging to monitor ICH overnight. Eliquis reversed. Pt. With some hypoglycemia that has resolved now. Pt. Seen by PT/OT/SLP and they recommend CIR to assist return to PLOF.    Complete NIHSS TOTAL: 1   Patient's medical record from [facility] has been reviewed by the rehabilitation admission coordinator and physician.   Past Medical History      Past Medical History:  Diagnosis Date   Amblyopia of right eye     Anxiety     Aortic atherosclerosis (HCC)     Arthritis     Atrial fibrillation and flutter (HCC) 05/2018    a.) CHA2DS2VASc = 4 (age x 2, HTN, vascular disease history); b.) rate/rhythm maintained on oral metoprolol tartrate; chronically anticoagulated with dose reduced apixaban (experienced hematuria on rivaroxaban)   B12 deficiency     Cervical spondylosis with myelopathy     Chronic kidney disease, stage 3 (HCC)     DDD (degenerative disc disease), lumbar     Diverticulosis     Esophageal dysphagia     External hemorrhoids     GERD (gastroesophageal reflux disease)     GI  bleed     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   History of echocardiogram      a.) TTE 06/07/2018: EF 60-65%, no RWMAs, mild MR, PASP 33 mmHg; b.) TTE 09/19/2019: EF 50-55%, mild LVH, mod RVE, mild LA dil, mild-mod TR, mild MR, PASP 42.5; c.) TTE 09/14/2022: EF 60-65%, mod basal-septal LVH, mild MR/AR, mod TR, AoV sclerosis, Ao root 37 mm   History of GI diverticular bleed      a.) required prolonged ICU admission at [facility]   History of kidney stones     HLD (hyperlipidemia)     Hyperlipidemia     Hypertension     Kyphoscoliosis     Meniscus tear     On apixaban therapy     OSA (obstructive sleep apnea)      a.) does not require nocturnal PAP therapy   Osteopenia     Peripheral neuropathy     Prostate cancer (HCC) 2011   PTSD (post-traumatic stress disorder)     RBBB (right bundle branch block)     SBO (small bowel obstruction) (HCC)     Skin cancer of scalp             Has the patient had major surgery during 100 days prior to admission? No   Family History   family history includes Cancer in his brother and father; Dementia in his mother.   Current Medications  Current Medications    Current Facility-Administered Medications:     stroke: early stages of recovery book, , Does not apply, Once, Rust-Chester, Micheline Rough L, NP   acetaminophen (TYLENOL) tablet 650 mg, 650 mg, Oral, Q4H PRN, 650 mg at 02/16/23 2044 **OR** acetaminophen (TYLENOL) 160 MG/5ML solution 650 mg, 650 mg, Per Tube, Q4H PRN **OR** acetaminophen (TYLENOL) suppository 650 mg, 650 mg, Rectal, Q4H PRN, Rust-Chester, Micheline Rough L, NP   feeding supplement (ENSURE ENLIVE / ENSURE PLUS) liquid 237 mL, 237 mL, Oral, TID BM, Aleskerov, Fuad, MD, 237 mL at 02/20/23 2015   hydrALAZINE (APRESOLINE) injection 10-20 mg, 10-20 mg, Intravenous, Q4H PRN, Rust-Chester, Britton L, NP, 20 mg at 02/16/23 0308   metoprolol tartrate (LOPRESSOR) tablet 12.5 mg, 12.5 mg, Oral, BID, Dgayli, Khabib, MD, 12.5 mg at 02/20/23 2119   multivitamin with minerals tablet 1 tablet, 1 tablet, Oral, Daily, Vida Rigger, MD, 1 tablet at 02/20/23 0912   ondansetron (ZOFRAN) injection 4 mg, 4 mg, Intravenous, Q8H PRN, Lorretta Harp, MD   Oral care mouth rinse, 15 mL, Mouth Rinse, PRN, Karna Christmas, Fuad, MD   pantoprazole (PROTONIX) EC tablet 40 mg, 40 mg, Oral, Daily, Esaw Grandchild A, DO, 40 mg at 02/20/23 0912   senna-docusate (Senokot-S) tablet 1 tablet, 1 tablet, Oral, BID PRN, Mila Merry A, RPH   simvastatin (ZOCOR) tablet 20 mg, 20 mg, Oral, QHS, Rust-Chester, Britton L, NP, 20 mg at 02/20/23 2119     Patients Current Diet:  Diet Order                  Diet regular Room service appropriate? No; Fluid consistency: Thin  Diet effective now                         Precautions / Restrictions Precautions Precautions: Fall Restrictions Weight Bearing Restrictions: No    Has the patient had 2 or more  falls or a fall with injury in the past year? No   Prior Activity Level Community (5-7x/wk): Pt. active in the community PTA  Prior Functional Level Self Care: Did the patient need help bathing, dressing, using the toilet or eating? Independent   Indoor Mobility: Did the patient need assistance with walking from room to room (with or without device)? Independent   Stairs: Did the patient need assistance with internal or external stairs (with or without device)? Independent   Functional Cognition: Did the patient need help planning regular tasks such as shopping or remembering to take medications? Independent   Patient Information Are you of Hispanic, Latino/a,or Spanish origin?: A. No, not of Hispanic, Latino/a, or Spanish origin What is your race?: A. White Do you need or want an interpreter to communicate with a doctor or health care staff?: 0. No   Patient's Response To:  Health Literacy and Transportation Is the patient able to respond to health literacy and transportation needs?: Yes Health Literacy - How often do you need to have someone help you when you read instructions, pamphlets, or other written material from your doctor or pharmacy?: Never In the past 12 months, has lack of transportation kept you from medical appointments or from getting medications?: No In the past 12 months, has lack of transportation kept you from meetings, work, or from getting things needed for daily living?: No   Home Assistive Devices / Equipment Home Equipment: Agricultural consultant (2 wheels), [employer] - single point   Prior Device Use: Indicate devices/aids used by the patient prior to current illness, exacerbation or injury? None of the above   Current Functional Level Cognition   Arousal/Alertness: Awake/alert Overall Cognitive Status: Within Functional Limits for tasks assessed Orientation Level: Oriented X4 Following Commands: Follows one step commands with increased time, Follows one step  commands inconsistently General Comments: states location as hospital, unable to state time Problem Solving: Impaired Problem Solving Impairment:  (unable to recall/demo use of call bell without cues)    Extremity Assessment (includes Sensation/Coordination)   Upper Extremity Assessment: Overall WFL for tasks assessed  Lower Extremity Assessment: Difficult to assess due to impaired cognition (Pt LE's overall strong during MMT)     ADLs   Overall ADL's : Needs assistance/impaired Grooming: Oral care, Standing, Supervision/safety Upper Body Bathing: Set up, Sitting Lower Body Bathing: Minimal assistance, Sit to/from stand Upper Body Dressing : Set up, Sitting Tub/ Shower Transfer: Minimal assistance, Grab bars, Rolling walker (2 wheels), Ambulation, Cueing for safety Functional mobility during ADLs: Contact guard assist General ADL Comments: Pt stood at sink x8 mins to perform oral hygiene with RW in place and no UE support to maintain balance with SUP.     Mobility   Overal bed mobility: Needs Assistance Bed Mobility: Supine to Sit Supine to sit: Min assist Sit to supine: Supervision General bed mobility comments: increased time and cues     Transfers   Overall transfer level: Needs assistance Equipment used: Rolling walker (2 wheels) Transfers: Sit to/from Stand Sit to Stand: Contact guard assist General transfer comment: cues for hand placement     Ambulation / Gait / Stairs / Wheelchair Mobility   Ambulation/Gait Ambulation/Gait assistance: Contact guard assist Gait Distance (Feet): 360 Feet Assistive device: Rolling walker (2 wheels) Gait Pattern/deviations: Step-through pattern, Decreased step length - right, Decreased step length - left, Staggering left, Staggering right General Gait Details: overall improving but increased sway side to side and back with gait increasing fall risk Gait velocity: decresed     Posture / Balance Balance Overall balance assessment: Needs  assistance Sitting-balance support: Feet supported Sitting balance-Leahy Scale: Good Standing balance  support: During functional activity, No upper extremity supported Standing balance-Leahy Scale: Good Standing balance comment: dynamic standing at sink for oral hygiene and during shower transfer     Special needs/care consideration Special service needs none    Previous Home Environment (from acute therapy documentation) Living Arrangements: Alone  Lives With: Alone Available Help at Discharge: Neighbor Type of Home: House Home Layout: One level Home Access: Stairs to enter Entrance Stairs-Rails: Right (vertical grab bar on R side of garage steps) Entrance Stairs-Number of Steps: 1 (1 step in garage, 1 step in front of house) Home Care Services: No Additional Comments: Pt home set up provided via neighbor who was present during evauation   Discharge Living Setting Plans for Discharge Living Setting: Patient's home Type of Home at Discharge: House Discharge Home Layout: One level Discharge Home Access: Stairs to enter Entrance Stairs-Rails: Right Entrance Stairs-Number of Steps: 1 Discharge Bathroom Shower/Tub: Walk-in shower Discharge Bathroom Toilet: Standard Discharge Bathroom Accessibility: Yes How Accessible: Accessible via walker Does the patient have any problems obtaining your medications?: No   Social/Family/Support Systems Patient Roles: Partner Contact Information: [phone] Anticipated Caregiver: [name] (this is patient's partner, she lives next door but is going to move into his home to care for him Caregiver Availability: 24/7 Discharge Plan Discussed with Primary Caregiver: Yes Is Caregiver In Agreement with Plan?: Yes Does Caregiver/Family have Issues with Lodging/Transportation while Pt is in Rehab?: No   Goals Patient/Family Goal for Rehab: PT/OT Mod I, SLP supervision Expected length of stay: 5-7 days Pt/Family Agrees to Admission and willing to  participate: Yes Program Orientation Provided & Reviewed with Pt/Caregiver Including Roles  & Responsibilities: Yes   Decrease burden of Care through IP rehab admission: not anticipated   Possible need for SNF placement upon discharge: not anticipated   Patient Condition: I have reviewed medical records from [facility], spoken with CM, and patient, son, and family member. I discussed via phone for inpatient rehabilitation assessment.  Patient will benefit from ongoing PT, OT, and SLP, can actively participate in 3 hours of therapy a day 5 days of the week, and can make measurable gains during the admission.  Patient will also benefit from the coordinated team approach during an Inpatient Acute Rehabilitation admission.  The patient will receive intensive therapy as well as Rehabilitation physician, nursing, social worker, and care management interventions.  Due to safety, skin/wound care, disease management, medication administration, pain management, and patient education the patient requires 24 hour a day rehabilitation nursing.  The patient is currently min A- CGA with mobility and basic ADLs.  Discharge setting and therapy post discharge at home with home health is anticipated.  Patient has agreed to participate in the Acute Inpatient Rehabilitation Program and will admit today.   Preadmission Screen Completed By:  Jeronimo Greaves, 02/21/2023 8:36 AM ______________________________________________________________________   Discussed status with Dr. Riley Kill  on 02/23/23 at 930 and received approval for admission today.   Admission Coordinator:  Jeronimo Greaves, CCC-SLP, time 1104/Date 02/23/23   Assessment/Plan: Diagnosis:  midline pontine hemorrhage Does the need for close, 24 hr/day Medical supervision in concert with the patient's rehab needs make it unreasonable for this patient to be served in a less intensive setting? Yes Co-Morbidities requiring supervision/potential  complications: a flutter, diverticular bleed hx, prostate cancer, OSA, anxiety              Due to bladder management, bowel management, safety, skin/wound care, disease management, medication administration, pain management, and  patient education, does the patient require 24 hr/day rehab nursing? Yes Does the patient require coordinated care of a physician, rehab nurse, PT, OT, and SLP to address physical and functional deficits in the context of the above medical diagnosis(es)? Yes Addressing deficits in the following areas: balance, endurance, locomotion, strength, transferring, bowel/bladder control, bathing, dressing, feeding, grooming, toileting, cognition, speech, swallowing, and psychosocial support Can the patient actively participate in an intensive therapy program of at least 3 hrs of therapy 5 days a week? Yes The potential for patient to make measurable gains while on inpatient rehab is excellent Anticipated functional outcomes upon discharge from inpatient rehab: modified independent PT, modified independent OT, supervision SLP Estimated rehab length of stay to reach the above functional goals is: 5-7 days Anticipated discharge destination: Home 10. Overall Rehab/Functional Prognosis: excellent     MD Signature: Ranelle Oyster, MD, [facility] Physical Medicine & Rehabilitation Medical Director Rehabilitation Services 02/23/2023

## 2023-02-23 NOTE — TOC Progression Note (Signed)
Transition of Care [facility]) - Progression Note    Patient Details  Name: Ryan Allison MRN: [id number] Date of Birth: 1941/01/22  [facility] CM/SW Contact  Liliana Cline, LCSW Phone Number: 02/23/2023, 10:16 AM  Clinical Narrative:    Reached out to CIR rep to inquire about bed availability.        Expected Discharge Plan and Services                                               Social Determinants of Health (SDOH) Interventions SDOH Screenings   Food Insecurity: Patient Unable To Answer (02/16/2023)  Housing: Patient Unable To Answer (02/16/2023)  Transportation Needs: Patient Unable To Answer (02/16/2023)  Utilities: Patient Unable To Answer (02/16/2023)  Depression (PHQ2-9): Medium Risk (02/23/2020)  Financial Resource Strain: Low Risk  (01/11/2023)   Received from [facility]  Tobacco Use: Medium Risk (02/19/2023)    Readmission Risk Interventions     No data to display

## 2023-02-23 NOTE — Progress Notes (Signed)
Inpatient Rehab Admissions Coordinator:    I have a CIR bed for this Pt. Today. RN may call report to [phone].   Pt. To admit to CIR for intensive rehab to last an estimated 5-7 days with the goal of reaching mod I to supervision level and returning home with assistance of his significant other Rosalie.  Megan Salon, MS, CCC-SLP Rehab Admissions Coordinator  [phone] (celll) [phone] (office)

## 2023-02-24 DIAGNOSIS — I69198 Other sequelae of nontraumatic intracerebral hemorrhage: Principal | ICD-10-CM

## 2023-02-24 DIAGNOSIS — G479 Sleep disorder, unspecified: Secondary | ICD-10-CM

## 2023-02-24 LAB — COMPREHENSIVE METABOLIC PANEL WITH GFR
ALT: 28 U/L (ref 0–44)
AST: 26 U/L (ref 15–41)
Albumin: 3.5 g/dL (ref 3.5–5.0)
Alkaline Phosphatase: 95 U/L (ref 38–126)
Anion gap: 5 (ref 5–15)
BUN: 33 mg/dL — ABNORMAL HIGH (ref 8–23)
CO2: 28 mmol/L (ref 22–32)
Calcium: 9.5 mg/dL (ref 8.9–10.3)
Chloride: 102 mmol/L (ref 98–111)
Creatinine, Ser: 1.08 mg/dL (ref 0.61–1.24)
GFR, Estimated: >60 mL/min
Glucose, Bld: 128 mg/dL — ABNORMAL HIGH (ref 70–99)
Potassium: 4.9 mmol/L (ref 3.5–5.1)
Sodium: 135 mmol/L (ref 135–145)
Total Bilirubin: 0.8 mg/dL (ref 0.3–1.2)
Total Protein: 7.4 g/dL (ref 6.5–8.1)

## 2023-02-24 LAB — CBC WITH DIFFERENTIAL/PLATELET
Abs Immature Granulocytes: 0.07 10*3/uL (ref 0.00–0.07)
Basophils Absolute: 0.1 10*3/uL (ref 0.0–0.1)
Basophils Relative: 1 %
Eosinophils Absolute: 0.1 10*3/uL (ref 0.0–0.5)
Eosinophils Relative: 2 %
HCT: 42.6 % (ref 39.0–52.0)
Hemoglobin: 14.4 g/dL (ref 13.0–17.0)
Immature Granulocytes: 1 %
Lymphocytes Relative: 15 %
Lymphs Abs: 1.2 10*3/uL (ref 0.7–4.0)
MCH: 33.1 pg (ref 26.0–34.0)
MCHC: 33.8 g/dL (ref 30.0–36.0)
MCV: 97.9 fL (ref 80.0–100.0)
Monocytes Absolute: 0.7 10*3/uL (ref 0.1–1.0)
Monocytes Relative: 9 %
Neutro Abs: 5.9 10*3/uL (ref 1.7–7.7)
Neutrophils Relative %: 72 %
Platelets: 231 10*3/uL (ref 150–400)
RBC: 4.35 MIL/uL (ref 4.22–5.81)
RDW: 12.8 % (ref 11.5–15.5)
WBC: 8.1 10*3/uL (ref 4.0–10.5)
nRBC: 0 % (ref 0.0–0.2)

## 2023-02-24 MED ORDER — TRAZODONE HCL 50 MG PO TABS
25.0000 mg | ORAL_TABLET | Freq: Every evening | ORAL | Status: DC | PRN
  Administered 2023-02-24 – 2023-02-25 (×2): 25 mg via ORAL
  Filled 2023-02-24 (×2): qty 1

## 2023-02-24 NOTE — Plan of Care (Signed)
  Problem: RH Bathing Goal: LTG Patient will bathe all body parts with assist levels (OT) Description: LTG: Patient will bathe all body parts with assist levels (OT) Flowsheets (Taken 02/24/2023 1603) LTG: Pt will perform bathing with assistance level/cueing: Independent with assistive device  LTG: Position pt will perform bathing: Shower   Problem: RH Dressing Goal: LTG Patient will perform lower body dressing w/assist (OT) Description: LTG: Patient will perform lower body dressing with assist, with/without cues in positioning using equipment (OT) Flowsheets (Taken 02/24/2023 1603) LTG: Pt will perform lower body dressing with assistance level of: Independent with assistive device   Problem: RH Toileting Goal: LTG Patient will perform toileting task (3/3 steps) with assistance level (OT) Description: LTG: Patient will perform toileting task (3/3 steps) with assistance level (OT)  Flowsheets (Taken 02/24/2023 1603) LTG: Pt will perform toileting task (3/3 steps) with assistance level: Independent with assistive device   Problem: RH Simple Meal Prep Goal: LTG Patient will perform simple meal prep w/assist (OT) Description: LTG: Patient will perform simple meal prep with assistance, with/without cues (OT). Flowsheets (Taken 02/24/2023 1603) LTG: Pt will perform simple meal prep with assistance level of: Independent with assistive device   Problem: RH Light Housekeeping Goal: LTG Patient will perform light housekeeping w/assist (OT) Description: LTG: Patient will perform light housekeeping with assistance, with/without cues (OT). Flowsheets (Taken 02/24/2023 1603) LTG: Pt will perform light housekeeping with assistance level of: Independent with assistive device

## 2023-02-24 NOTE — Progress Notes (Addendum)
PROGRESS NOTE   Subjective/Complaints:  No events overnight. Patient complaining of some difficulty sleeping; due to unfamiliarity with environment. He is already on scheduled melatonin. No other concerns. Vitals stable, labs stable Last BM 10/12   ROS: Positives per HPI above. Denies fevers, chills, N/V, abdominal pain, SOB, chest pain, new weakness or paraesthesias.    Objective:   No results found. Recent Labs    02/24/23 0721  WBC 8.1  HGB 14.4  HCT 42.6  PLT 231   Recent Labs    02/24/23 0721  NA 135  K 4.9  CL 102  CO2 28  GLUCOSE 128*  BUN 33*  CREATININE 1.08  CALCIUM 9.5    Intake/Output Summary (Last 24 hours) at 02/24/2023 0950 Last data filed at 02/24/2023 0930 Gross per 24 hour  Intake 716 ml  Output 1675 ml  Net -959 ml        Physical Exam: Vital Signs Blood pressure 131/74, pulse 88, temperature 97.7 F (36.5 C), resp. rate 18, height 5\' 10"  (1.778 m), weight 72 kg, SpO2 100%.   PE: Constitution: Appropriate appearance for age. No apparent distress. Resp: No respiratory distress. No accessory muscle usage. on RA and CTAB Cardio: Well perfused appearance. No peripheral edema. Abdomen: Nondistended. Nontender.   Psych: Appropriate mood and affect. Neuro: AAOx4. No apparent cognitive deficits   Neurologic Exam:   Sensory exam: revealed normal sensation Motor exam: Moving all 4 extremities antigravity and against resistance Coordination: Fine motor coordination was normal.      Assessment/Plan: 1. Functional deficits which require 3+ hours per day of interdisciplinary therapy in a comprehensive inpatient rehab setting. Physiatrist is providing close team supervision and 24 hour management of active medical problems listed below. Physiatrist and rehab team continue to assess barriers to discharge/monitor patient progress toward functional and medical goals  Care  Tool:  Bathing              Bathing assist       Upper Body Dressing/Undressing Upper body dressing        Upper body assist      Lower Body Dressing/Undressing Lower body dressing            Lower body assist       Toileting Toileting    Toileting assist       Transfers Chair/bed transfer  Transfers assist           Locomotion Ambulation   Ambulation assist              Walk 10 feet activity   Assist           Walk 50 feet activity   Assist           Walk 150 feet activity   Assist           Walk 10 feet on uneven surface  activity   Assist           Wheelchair     Assist               Wheelchair 50 feet with 2 turns activity    Assist  Wheelchair 150 feet activity     Assist          Blood pressure 131/74, pulse 88, temperature 97.7 F (36.5 C), resp. rate 18, height 5\' 10"  (1.778 m), weight 72 kg, SpO2 100%.    1. 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 Unclear etiology. 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 ?small infarct with hemorrhagic transformation             -patient may  shower             -ELOS/Goals: 5-7 days, mod I with PT and OT, supervision to mod I with SLP   - Continue IPR   2.  Antithrombotics: -DVT/anticoagulation:  Mechanical:  Antiembolism stockings, knee (TED hose) Bilateral lower extremities -eliquis held d/t bleed             -antiplatelet therapy: holding also d/t bleed   3. Pain Management: Tylenol as needed   4. Mood/Behavior/Sleep: LCSW to evaluate and provide emotional support             -antipsychotic agents: n/a  - 10/12: Add trazodone 25 mg at bedtime PRN for sleep   5. Neuropsych/cognition: This patient is capable of making decisions on his own behalf.   6. Skin/Wound Care: Routine skin care checks   7. Fluids/Electrolytes/Nutrition: Routine Is and Os and follow-up chemistries             -continue MVI, Ensure              -pt states that his appetite is reasonable   - Admission labs stable   8: Hypertension: monitor TID and prn             -continue metoprolol tartrate 12.5 mg BID             -SBP goal of less than 150   - 10/12: Normotensive; monitor    02/24/2023    8:13 PM 02/24/2023    1:17 PM 02/24/2023    9:20 AM  Vitals with BMI  Systolic 123 122 782  Diastolic 65 65 74  Pulse 92 78 88     9: Hyperlipidemia: continue statin   10: GERD: continue PPI   11: Paroxysmal atrial fib/flutter: Eliquis held due to ICH; on BB             -on reduced dose of Eliquis due to prior GI bleeding (does not desire Watchman procedure)             -follows with Dr. Mariah Milling   12: CKD stage III: elevated BUN (baseline creatinine 0.8-1.0)             -follow-up BMP Monday-- admission labs stable   13: Recent left foot surgery: hammer toe correction 8/23             -follow-up Dr. 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 While the ostomy generally stays sealed, it now sits very low on his belly, and the bag literally hangs between his legs. Perhaps some kind of strap or different bag could help.    16: OSA on CPAP ? using ?   17: Chronic diastolic CHF: monitor volume status/daily weight   - no signs volume overload; monitor Filed Weights   02/23/23 1409  Weight: 72 kg  LOS: 1 days A FACE TO FACE EVALUATION WAS PERFORMED  Angelina Sheriff 02/24/2023, 9:50 AM

## 2023-02-24 NOTE — Plan of Care (Signed)
  Problem: Consults Goal: RH STROKE PATIENT EDUCATION Description: See Patient Education module for education specifics  Outcome: Progressing   Problem: RH BOWEL ELIMINATION Goal: RH STG MANAGE BOWEL WITH ASSISTANCE Description: STG Manage Bowel with mod I  Assistance. Outcome: Progressing   Problem: RH SAFETY Goal: RH STG ADHERE TO SAFETY PRECAUTIONS W/ASSISTANCE/DEVICE Description: STG Adhere to Safety Precautions With cues Assistance/Device. Outcome: Progressing   Problem: RH PAIN MANAGEMENT Goal: RH STG PAIN MANAGED AT OR BELOW PT'S PAIN GOAL Description: < 4 with prns Outcome: Progressing   Problem: RH KNOWLEDGE DEFICIT Goal: RH STG INCREASE KNOWLEDGE OF HYPERTENSION Description: Patient and friend will be able to manage HTN with medications and dietary modifications using educational resources independently Outcome: Progressing Goal: RH STG INCREASE KNOWLEGDE OF HYPERLIPIDEMIA Description: Patient and friend will be able to manage HLD with medications and dietary modifications using educational resources independently Outcome: Progressing Goal: RH STG INCREASE KNOWLEDGE OF STROKE PROPHYLAXIS Description: Patient and friend will be able to manage secondary risks with medications and dietary modifications using educational resources independently Outcome: Progressing   Problem: Education: Goal: Knowledge of disease or condition will improve Outcome: Progressing Goal: Knowledge of secondary prevention will improve (MUST DOCUMENT ALL) Outcome: Progressing Goal: Knowledge of patient specific risk factors will improve Loraine Leriche N/A or DELETE if not current risk factor) Outcome: Progressing   Problem: Intracerebral Hemorrhage Tissue Perfusion: Goal: Complications of Intracerebral Hemorrhage will be minimized Outcome: Progressing   Problem: Coping: Goal: Will verbalize positive feelings about self Outcome: Progressing Goal: Will identify appropriate support needs Outcome:  Progressing   Problem: Health Behavior/Discharge Planning: Goal: Ability to manage health-related needs will improve Outcome: Progressing Goal: Goals will be collaboratively established with patient/family Outcome: Progressing   Problem: Self-Care: Goal: Ability to participate in self-care as condition permits will improve Outcome: Progressing Goal: Verbalization of feelings and concerns over difficulty with self-care will improve Outcome: Progressing Goal: Ability to communicate needs accurately will improve Outcome: Progressing   Problem: Nutrition: Goal: Risk of aspiration will decrease Outcome: Progressing Goal: Dietary intake will improve Outcome: Progressing
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
## 2023-02-24 NOTE — Plan of Care (Signed)
  Problem: RH Balance Goal: LTG Patient will maintain dynamic standing balance (PT) Description: LTG:  Patient will maintain dynamic standing balance with assistance during mobility activities (PT) Flowsheets (Taken 02/24/2023 1751) LTG: Pt will maintain dynamic standing balance during mobility activities with:: Independent with assistive device    Problem: Sit to Stand Goal: LTG:  Patient will perform sit to stand with assistance level (PT) Description: LTG:  Patient will perform sit to stand with assistance level (PT) Flowsheets (Taken 02/24/2023 1751) LTG: PT will perform sit to stand in preparation for functional mobility with assistance level: Independent with assistive device Note: Demonstrating improved strength for consistent Mod I/ IND transfers with improved balance.   Problem: RH Bed to Chair Transfers Goal: LTG Patient will perform bed/chair transfers w/assist (PT) Description: LTG: Patient will perform bed to chair transfers with assistance (PT). Flowsheets (Taken 02/24/2023 1751) LTG: Pt will perform Bed to Chair Transfers with assistance level: Independent with assistive device  Note: Demonstrating improved strength for consistent Mod I/ IND transfers with improved balance.   Problem: RH Car Transfers Goal: LTG Patient will perform car transfers with assist (PT) Description: LTG: Patient will perform car transfers with assistance (PT). Flowsheets (Taken 02/24/2023 1751) LTG: Pt will perform car transfers with assist:: Set up assist    Problem: RH Furniture Transfers Goal: LTG Patient will perform furniture transfers w/assist (OT/PT) Description: LTG: Patient will perform furniture transfers  with assistance (OT/PT). Flowsheets (Taken 02/24/2023 1751) LTG: Pt will perform furniture transfers with assist:: Independent with assistive device    Problem: RH Ambulation Goal: LTG Patient will ambulate in home environment (PT) Description: LTG: Patient will ambulate in home  environment, # of feet with assistance (PT). Flowsheets (Taken 02/24/2023 1751) LTG: Pt will ambulate in home environ  assist needed:: Supervision/Verbal cueing LTG: Ambulation distance in home environment: up to 50 feet per bout using LRAD Goal: LTG Patient will ambulate in community environment (PT) Description: LTG: Patient will ambulate in community environment, # of feet with assistance (PT). Flowsheets (Taken 02/24/2023 1751) LTG: Pt will ambulate in community environ  assist needed:: Supervision/Verbal cueing LTG: Ambulation distance in community environment: >250 feet using LRAD   Problem: RH Stairs Goal: LTG Patient will ambulate up and down stairs w/assist (PT) Description: LTG: Patient will ambulate up and down # of stairs with assistance (PT) Flowsheets (Taken 02/24/2023 1751) LTG: Pt will ambulate up/down stairs assist needed:: Supervision/Verbal cueing LTG: Pt will  ambulate up and down number of stairs: at least 4 steps using HR setup as per home environment

## 2023-02-25 NOTE — Plan of Care (Signed)
  Problem: Consults Goal: RH STROKE PATIENT EDUCATION Description: See Patient Education module for education specifics  Outcome: Progressing   Problem: RH BOWEL ELIMINATION Goal: RH STG MANAGE BOWEL WITH ASSISTANCE Description: STG Manage Bowel with mod I  Assistance. Outcome: Progressing   Problem: RH SAFETY Goal: RH STG ADHERE TO SAFETY PRECAUTIONS W/ASSISTANCE/DEVICE Description: STG Adhere to Safety Precautions With cues Assistance/Device. Outcome: Progressing   Problem: RH PAIN MANAGEMENT Goal: RH STG PAIN MANAGED AT OR BELOW PT'S PAIN GOAL Description: < 4 with prns Outcome: Progressing   Problem: RH KNOWLEDGE DEFICIT Goal: RH STG INCREASE KNOWLEDGE OF HYPERTENSION Description: Patient and friend will be able to manage HTN with medications and dietary modifications using educational resources independently Outcome: Progressing Goal: RH STG INCREASE KNOWLEGDE OF HYPERLIPIDEMIA Description: Patient and friend will be able to manage HLD with medications and dietary modifications using educational resources independently Outcome: Progressing Goal: RH STG INCREASE KNOWLEDGE OF STROKE PROPHYLAXIS Description: Patient and friend will be able to manage secondary risks with medications and dietary modifications using educational resources independently Outcome: Progressing   Problem: Education: Goal: Knowledge of disease or condition will improve Outcome: Progressing Goal: Knowledge of secondary prevention will improve (MUST DOCUMENT ALL) Outcome: Progressing Goal: Knowledge of patient specific risk factors will improve Loraine Leriche N/A or DELETE if not current risk factor) Outcome: Progressing   Problem: Intracerebral Hemorrhage Tissue Perfusion: Goal: Complications of Intracerebral Hemorrhage will be minimized Outcome: Progressing   Problem: Coping: Goal: Will verbalize positive feelings about self Outcome: Progressing Goal: Will identify appropriate support needs Outcome:  Progressing   Problem: Health Behavior/Discharge Planning: Goal: Ability to manage health-related needs will improve Outcome: Progressing Goal: Goals will be collaboratively established with patient/family Outcome: Progressing   Problem: Self-Care: Goal: Ability to participate in self-care as condition permits will improve Outcome: Progressing Goal: Verbalization of feelings and concerns over difficulty with self-care will improve Outcome: Progressing Goal: Ability to communicate needs accurately will improve Outcome: Progressing   Problem: Nutrition: Goal: Risk of aspiration will decrease Outcome: Progressing Goal: Dietary intake will improve Outcome: Progressing

## 2023-02-25 NOTE — Progress Notes (Signed)
PROGRESS NOTE   Subjective/Complaints:  No events overnight. No acute complaints. Patient requests DC of weekly labs unless specific concern.  Slept better overnight with trazodone. Vitals stable, labs stable Last BM 10/12   ROS: Positives per HPI above. Denies fevers, chills, N/V, abdominal pain, SOB, chest pain, new weakness or paraesthesias.  Insomnia resolved.  Objective:   No results found. Recent Labs    02/24/23 0721  WBC 8.1  HGB 14.4  HCT 42.6  PLT 231   Recent Labs    02/24/23 0721  NA 135  K 4.9  CL 102  CO2 28  GLUCOSE 128*  BUN 33*  CREATININE 1.08  CALCIUM 9.5    Intake/Output Summary (Last 24 hours) at 02/25/2023 0955 Last data filed at 02/25/2023 0830 Gross per 24 hour  Intake 480 ml  Output 1300 ml  Net -820 ml        Physical Exam: Vital Signs Blood pressure 118/72, pulse 83, temperature 98.3 F (36.8 C), resp. rate 15, height 5\' 10"  (1.778 m), weight 68.2 kg, SpO2 100%.   PE: Constitution: Appropriate appearance for age. No apparent distress.  Laying in bed. Resp: No respiratory distress. No accessory muscle usage. on RA and CTAB Cardio: Well perfused appearance. No peripheral edema. Abdomen: Nondistended. Nontender.  Right lower quadrant ileostomy with appropriate output. Psych: Appropriate mood and affect. Neuro: AAOx4. No apparent cognitive deficits   Neurologic Exam:   Sensory exam: revealed normal sensation Motor exam: Moving all 4 extremities antigravity and against resistance Coordination: Fine motor coordination was normal.     MSK.  Full active range of motion bilateral upper and lower extremities.  Keeps head preferentially forward flexed.  Assessment/Plan: 1. Functional deficits which require 3+ hours per day of interdisciplinary therapy in a comprehensive inpatient rehab setting. Physiatrist is providing close team supervision and 24 hour management of active  medical problems listed below. Physiatrist and rehab team continue to assess barriers to discharge/monitor patient progress toward functional and medical goals  Care Tool:  Bathing    Body parts bathed by patient: Right arm, Left arm, Chest, Abdomen, Front perineal area, Buttocks, Right upper leg, Left upper leg, Face, Left lower leg, Right lower leg         Bathing assist Assist Level: Supervision/Verbal cueing     Upper Body Dressing/Undressing Upper body dressing   What is the patient wearing?: Pull over shirt    Upper body assist Assist Level: Set up assist    Lower Body Dressing/Undressing Lower body dressing      What is the patient wearing?: Pants     Lower body assist Assist for lower body dressing: Minimal Assistance - Patient > 75%     Toileting Toileting    Toileting assist Assist for toileting: Independent with assistive device     Transfers Chair/bed transfer  Transfers assist     Chair/bed transfer assist level: Supervision/Verbal cueing     Locomotion Ambulation   Ambulation assist      Assist level: Contact Guard/Touching assist   Max distance: 220 ft   Walk 10 feet activity   Assist     Assist level: Contact Guard/Touching assist Assistive device:  No Device   Walk 50 feet activity   Assist    Assist level: Contact Guard/Touching assist Assistive device: Walker-rolling    Walk 150 feet activity   Assist    Assist level: Supervision/Verbal cueing Assistive device: Walker-rolling    Walk 10 feet on uneven surface  activity   Assist Walk 10 feet on uneven surfaces activity did not occur: Safety/medical concerns         Wheelchair     Assist Is the patient using a wheelchair?: No   Wheelchair activity did not occur: N/A         Wheelchair 50 feet with 2 turns activity    Assist    Wheelchair 50 feet with 2 turns activity did not occur: N/A       Wheelchair 150 feet activity      Assist  Wheelchair 150 feet activity did not occur: N/A       Blood pressure 118/72, pulse 83, temperature 98.3 F (36.8 C), resp. rate 15, height 5\' 10"  (1.778 m), weight 68.2 kg, SpO2 100%.    1. 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 Monday routine labs unless specific concerns; endorses he will comply with lab work if needed   2.  Antithrombotics: -DVT/anticoagulation:  Mechanical:  Antiembolism stockings, knee (TED hose) Bilateral lower extremities -eliquis held d/t bleed             -antiplatelet therapy: holding also d/t bleed   3. Pain Management: Tylenol as needed   4. Mood/Behavior/Sleep: LCSW to evaluate and provide emotional support             -antipsychotic agents: n/a  - 10/12: Add trazodone 25 mg at bedtime PRN for sleep-improved   5. Neuropsych/cognition: This patient is capable of making decisions on his own behalf.   6. Skin/Wound Care: Routine skin care checks   7. Fluids/Electrolytes/Nutrition: Routine Is and Os and follow-up chemistries             -continue MVI, Ensure             -pt states that his appetite is reasonable   - Admission labs stable   8: Hypertension: monitor TID and prn             -continue metoprolol tartrate 12.5 mg BID             -SBP goal of less than 150   - 10/12: Normotensive; monitor    02/25/2023    8:25 AM 02/25/2023    7:03 AM 02/25/2023    5:42 AM  Vitals with BMI  Weight  150 lbs 6 oz   BMI  21.57   Systolic 118  133  Diastolic 72  79  Pulse 83  76     9: Hyperlipidemia: continue statin   10: GERD: continue PPI   11: Paroxysmal atrial fib/flutter: Eliquis held due to ICH; on BB             -on reduced dose of  Eliquis due to prior GI bleeding (does not desire Watchman procedure)             -  follows with Dr. Mariah Milling  -Heart rate well-controlled   12: CKD stage III: elevated BUN (baseline creatinine 0.8-1.0)             -follow-up BMP Monday-- admission labs stable   13: Recent left foot surgery: hammer toe correction 8/23             -follow-up Dr. Lilian Kapur   14: History of prostate cancer   15: s/p colectomy/end colostomy at [facility] 06/2018             -consult WOCN as the ostomy was placed when he was 70 lbs heavier. While the ostomy generally stays sealed, it now sits very low on his belly, and the bag literally hangs between his legs. Perhaps some kind of strap or different bag could help.    16: OSA on CPAP-continue use at nighttime.   17: Chronic diastolic CHF: monitor volume status/daily weight   - no signs volume overload; monitor -Weight stable/downtrending Filed Weights   02/23/23 1409 02/25/23 0703  Weight: 72 kg 68.2 kg     LOS: 2 days A FACE TO FACE EVALUATION WAS PERFORMED  Angelina Sheriff 02/25/2023, 9:55 AM

## 2023-02-26 DIAGNOSIS — I482 Chronic atrial fibrillation, unspecified: Secondary | ICD-10-CM | POA: Diagnosis not present

## 2023-02-26 DIAGNOSIS — R7989 Other specified abnormal findings of blood chemistry: Secondary | ICD-10-CM

## 2023-02-26 DIAGNOSIS — Z933 Colostomy status: Secondary | ICD-10-CM | POA: Diagnosis not present

## 2023-02-26 DIAGNOSIS — I613 Nontraumatic intracerebral hemorrhage in brain stem: Secondary | ICD-10-CM | POA: Diagnosis not present

## 2023-02-26 MED ORDER — TRAZODONE HCL 50 MG PO TABS
50.0000 mg | ORAL_TABLET | Freq: Every evening | ORAL | Status: DC | PRN
  Administered 2023-02-26 – 2023-02-27 (×2): 50 mg via ORAL
  Filled 2023-02-26 (×2): qty 1

## 2023-02-26 NOTE — Progress Notes (Signed)
Physical Therapy Session Note  Patient Details  Name: Ryan Allison MRN: [id number] Date of Birth: Jul 28, 1940  Today's Date: 02/26/2023 PT Individual Time: 1407-1505 PT Individual Time Calculation (min): 58 min   Short Term Goals: Week 1:  PT Short Term Goal 1 (Week 1): STG = LTG d/t ELOS  Skilled Therapeutic Interventions/Progress Updates:  Patient supine in bed on entrance to room. Neighbor and her dtr present. Patient alert and agreeable to PT session.   Patient with no pain complaint at start of session.  RN present to provide ensure to pt.   Pt provided this therapist with updates from Care Manager RN as well as WOC RN re: ostomy care and potential for updated ostomy supplies. Session went well and WOC RN would like to see pt again in the morning and then again in OP clinic following discharge.   Therapeutic Activity: Bed Mobility: Pt performed supine <> sit with Mod I. No cueing required. Transfers: Pt performed sit<>stand and stand pivot transfers throughout session with Mod I/ supervision for balance. No cueing required for technique.  Gait Training:  Pt ambulated >300 ft x2 using RW with supervision. Demonstrated good step through with constant pace and adequate floor clearance. Provided vc/ tc for level gaze.  Pt guided in continuous reciprocation of BUE and BLE using NuStep L4 x and then L5 x5 min with focus on maintaining pace and effort. Pt maintains METS between 2.1 and 2.4.   Discussion throughout re: discharge planning and questions re: potential for return to "putting/ chipping" when playing golf with sons.   Patient supine in bed at end of session with brakes locked, bed alarm not set for potential of pt need to toilet, and all needs within reach.   Therapy Documentation Precautions:  Precautions Precautions: Fall Precaution Comments: ileostomy Restrictions Weight Bearing Restrictions: No  Pain:  No pain related this session.   Therapy/Group:  Individual Therapy  Loel Dubonnet PT, DPT, CSRS 02/26/2023, 5:02 PM

## 2023-02-26 NOTE — Consult Note (Signed)
WOC Nurse ostomy consult note Stoma type/location: RLQ ileostomy  Patient has been managing at home for a few years and wants confirmation he is performing ostomy care appropriately.  HE changes his pouch at home every 5 days. His skin is intact.  Stomal assessment/size: 5/8" slightly budded pink and moist Peristomal assessment:  intact  due to small size of stoma and slightly budded, adding convexity.  Treatment options for stomal/peristomal skin:  stoma powder and skin prep, barrier ring and switching to 1 piece convex pouch.  Recommend medium belt.   Output soft green stool Ostomy pouching: 1pc. Convex  after lengthy discussion on pouch options, patient would like to try 1 piece and see if he likes it better.  Education provided:  He has lost significant weight (nearly 50 pounds) but no dehydration or complications from ileostomy with fluid/electrolyte balance.  He has a small separation around the stoma at 7 o'clock but is minimal and barrier ring protects this area.  He will need a medium belt and we only stock large. He is set up with [facility] and I will update his orders with them if we change.   He is interested in a filtered pouch as he states he has a lot of flatus.  We discuss differences in burping 1 piece vs 2 piece and that charcoal filter will not work if it gets wet from the inside (with liquid stool, possibly at night)  He verbalizes understanding.  Enrolled patient in [employer] DC program: Yes previously Will check back to see which pouch he prefers (1piece vs 2 piece) filtered or unfiltered   information provided for ostomy clinic, where he would like to be established.  Mike Gip MSN, RN, FNP-BC CWON Wound, Ostomy, Continence Nurse [facility] [phone] Pager [phone]

## 2023-02-26 NOTE — Progress Notes (Addendum)
PROGRESS NOTE   Subjective/Complaints:  Poor sleep , concerns about colostomy , discussed poor intake   ROS: Positives per HPI above. Neg CP, SOB, N/V/D Objective:   No results found. Recent Labs    02/24/23 0721  WBC 8.1  HGB 14.4  HCT 42.6  PLT 231   Recent Labs    02/24/23 0721  NA 135  K 4.9  CL 102  CO2 28  GLUCOSE 128*  BUN 33*  CREATININE 1.08  CALCIUM 9.5    Intake/Output Summary (Last 24 hours) at 02/26/2023 0803 Last data filed at 02/26/2023 0600 Gross per 24 hour  Intake 240 ml  Output 1350 ml  Net -1110 ml        Physical Exam: Vital Signs Blood pressure (!) 122/90, pulse 68, temperature 98 F (36.7 C), resp. rate 17, height 5\' 10"  (1.778 m), weight 68.2 kg, SpO2 100%.    General: No acute distress Mood and affect are appropriate Heart: Irregular rate and rhythm no rubs murmurs or extra sounds Lungs: Clear to auscultation, breathing unlabored, no rales or wheezes Abdomen: Positive bowel sounds, soft nontender to palpation, nondistended Extremities: No clubbing, cyanosis, or edema Skin: No evidence of breakdown, no evidence of rash Neurologic: Cranial nerves II through XII intact, motor strength is 5/5 in bilateral deltoid, bicep, tricep, grip, hip flexor, knee extensors, ankle dorsiflexor and plantar flexor Sensory exam normal sensation to light touch and proprioception in bilateral upper and lower extremities Cerebellar exam normal finger to nose to finger as well as heel to shin in bilateral upper and lower extremities Musculoskeletal: Kyphotic posture Full range of motion in all 4 extremities. No joint swelling   Assessment/Plan: 1. Functional deficits which require 3+ hours per day of interdisciplinary therapy in a comprehensive inpatient rehab setting. Physiatrist is providing close team supervision and 24 hour management of active medical problems listed below. Physiatrist and  rehab team continue to assess barriers to discharge/monitor patient progress toward functional and medical goals  Care Tool:  Bathing    Body parts bathed by patient: Right arm, Left arm, Chest, Abdomen, Front perineal area, Buttocks, Right upper leg, Left upper leg, Face, Left lower leg, Right lower leg         Bathing assist Assist Level: Supervision/Verbal cueing     Upper Body Dressing/Undressing Upper body dressing   What is the patient wearing?: Pull over shirt    Upper body assist Assist Level: Set up assist    Lower Body Dressing/Undressing Lower body dressing      What is the patient wearing?: Pants     Lower body assist Assist for lower body dressing: Minimal Assistance - Patient > 75%     Toileting Toileting    Toileting assist Assist for toileting: Independent with assistive device     Transfers Chair/bed transfer  Transfers assist     Chair/bed transfer assist level: Supervision/Verbal cueing     Locomotion Ambulation   Ambulation assist      Assist level: Contact Guard/Touching assist   Max distance: 220 ft   Walk 10 feet activity   Assist     Assist level: Contact Guard/Touching assist Assistive device: No Device  Walk 50 feet activity   Assist    Assist level: Contact Guard/Touching assist Assistive device: Walker-rolling    Walk 150 feet activity   Assist    Assist level: Supervision/Verbal cueing Assistive device: Walker-rolling    Walk 10 feet on uneven surface  activity   Assist Walk 10 feet on uneven surfaces activity did not occur: Safety/medical concerns         Wheelchair     Assist Is the patient using a wheelchair?: No   Wheelchair activity did not occur: N/A         Wheelchair 50 feet with 2 turns activity    Assist    Wheelchair 50 feet with 2 turns activity did not occur: N/A       Wheelchair 150 feet activity     Assist  Wheelchair 150 feet activity did not occur:  N/A       Blood pressure (!) 122/90, pulse 68, temperature 98 F (36.7 C), resp. rate 17, height 5\' 10"  (1.778 m), weight 68.2 kg, SpO2 100%.    1. 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 ?small infarct with hemorrhagic transformation             -patient may  shower             -ELOS/Goals: 5-7 days, mod I with PT and OT, supervision to mod I with SLP   - Continue IPR  -Patient request discontinuation of q. Monday routine labs unless specific concerns; endorses he will comply with lab work if needed   2.  Antithrombotics: -DVT/anticoagulation:  Mechanical:  Antiembolism stockings, knee (TED hose) Bilateral lower extremities -eliquis held d/t bleed             -antiplatelet therapy: holding also d/t bleed   3. Pain Management: Tylenol as needed   4. Mood/Behavior/Sleep: LCSW to evaluate and provide emotional support             -antipsychotic agents: n/a  - 10/12: Add trazodone 25 mg at bedtime PRN for sleep-improved   5. Neuropsych/cognition: This patient is capable of making decisions on his own behalf.   6. Skin/Wound Care: Routine skin care checks.  Ostomy RN to eval , start low residual diet    7. Fluids/Electrolytes/Nutrition: Routine Is and Os and follow-up chemistries             -continue MVI, Ensure             -pt states that his appetite is reasonable   - Admission labs stable   8: Hypertension: monitor TID and prn             -continue metoprolol tartrate 12.5 mg BID             -SBP goal of less than 150   - 10/12: Normotensive; monitor    02/26/2023    4:58 AM 02/25/2023    7:53 PM 02/25/2023    6:31 PM  Vitals with BMI  Systolic 122 134 086  Diastolic 90 68 60  Pulse 68 91 87     9: Hyperlipidemia: continue statin   10: GERD: continue PPI   11: Paroxysmal atrial fib/flutter: Eliquis held due to ICH; on BB             -on reduced dose of Eliquis due to prior GI bleeding  (does not desire Watchman procedure)             -  follows with Dr. Mariah Milling  -Heart rate well-controlled   12: CKD stage III: elevated BUN (baseline creatinine 0.8-1.0)             -follow-up BMP Monday-- admission labs stable   13: Recent left foot surgery: hammer toe correction 8/23             -follow-up Dr. Lilian Kapur   14: History of prostate cancer   15: s/p colectomy/end colostomy at [facility] 06/2018             -consult WOCN as the ostomy was placed when he was 70 lbs heavier. While the ostomy generally stays sealed, it now sits very low on his belly, and the bag literally hangs between his legs. Perhaps some kind of strap or different bag could help.    16: OSA on CPAP-continue use at nighttime.   17: Chronic diastolic CHF: monitor volume status/daily weight   - no signs volume overload; monitor -Weight stable/downtrending Filed Weights   02/23/23 1409 02/25/23 0703  Weight: 72 kg 68.2 kg     LOS: 3 days A FACE TO FACE EVALUATION WAS PERFORMED  Ryan Allison Kristyn Obyrne 02/26/2023, 8:03 AM

## 2023-02-26 NOTE — Progress Notes (Signed)
Patient ID: Ryan Allison, male   DOB: 12/06/1940, 82 y.o.   MRN: [id number] Met with the patient and friend to review current situation, rehab schedule, team conference and plan of care. Discussed secondary risk management including OSA; not using a CPAP, HTN, HLD(Trig 63) and A-flutter on Eliquis. Concerned about ostomy; requesting information on low fiber diet. Given information on medications , dietary modification recommendations and ostomy care with OP ostomy clinic contact # and Consumer assist program # for ostomy supplies. Continue to follow along to address educational needs to facilitate preparation for discharge. Pamelia Hoit

## 2023-02-26 NOTE — IPOC Note (Signed)
Overall Plan of Care [facility]) Patient Details Name: Ryan Allison MRN: [id number] DOB: 12-28-1940  Admitting Diagnosis: ICH (intracerebral hemorrhage) [facility])  Hospital Problems: Principal Problem:   ICH (intracerebral hemorrhage) (HCC)     Functional Problem List: Nursing Bowel, Safety, Medication Management, Endurance, Nutrition  PT Balance, Endurance, Safety, Pain  OT Endurance, Motor  SLP    TR         Basic ADL's: OT Bathing, Dressing, Toileting     Advanced  ADL's: OT Simple Meal Preparation, Light Housekeeping     Transfers: PT Car, Furniture, Floor, Bed to Chair  [employer], Tub/Shower     Locomotion: PT Ambulation, Stairs     Additional Impairments: OT None  SLP        TR      Anticipated Outcomes Item Anticipated Outcome  Self Feeding no goal, pt is independent  Swallowing      Basic self-care  Mod I  Toileting  Mod I   Bathroom Transfers Mod I  Bowel/Bladder  manage bowel w mod I assist  Transfers  Mod I  Locomotion  Mod I  Communication     Cognition     Pain  manage < 4 with prns  Safety/Judgment  manage w cues   Therapy Plan: PT Intensity: Minimum of 1-2 x/day ,45 to 90 minutes PT Frequency: 5 out of 7 days PT Duration Estimated Length of Stay: 5 - 7 days OT Intensity: Minimum of 1-2 x/day, 45 to 90 minutes OT Frequency: 5 out of 7 days OT Duration/Estimated Length of Stay: 5-7 days     Team Interventions: Nursing Interventions Disease Management/Prevention, Medication Management, Discharge Planning, Patient/Family Education, Bowel Management, Pain Management  PT interventions Ambulation/gait training, Warden/ranger, Cognitive remediation/compensation, Community reintegration, Discharge planning, Disease management/prevention, DME/adaptive equipment instruction, Neuromuscular re-education, Psychosocial support, Stair training, UE/LE Strength taining/ROM, Wheelchair propulsion/positioning, Functional mobility  training, Patient/family education, Therapeutic Exercise, UE/LE Coordination activities, Therapeutic Activities, Skin care/wound management  OT Interventions Functional mobility training, Self Care/advanced ADL retraining, Therapeutic Exercise, UE/LE Strength taining/ROM, Therapeutic Activities, Patient/family education, Discharge planning  SLP Interventions    TR Interventions    SW/CM Interventions     Barriers to Discharge MD  Lack of/limited family support  Nursing Decreased caregiver support 1 level 1/1 right rail w friend  PT Inaccessible home environment, Decreased caregiver support, Home environment access/layout, Other (comments), Lack of/limited family support (pending change in ostomy supplies)    OT      SLP      SW       Team Discharge Planning: Destination: PT-Home ,OT- Home , SLP-Home Projected Follow-up: PT-Home health PT, Other (comment) (daytime supervision), OT-  None, SLP-None Projected Equipment Needs: PT-To be determined, OT- None recommended by OT, SLP-None recommended by SLP Equipment Details: PT- , OT-  Patient/family involved in discharge planning: PT- Patient,  OT-Patient, SLP-Patient  MD ELOS: 5-7d Medical Rehab Prognosis:  Excellent Assessment: The patient has been admitted for CIR therapies with the diagnosis of ICH. The team will be addressing functional mobility, strength, stamina, balance, safety, adaptive techniques and equipment, self-care, bowel and bladder mgt, patient and caregiver education, safety , insomnia . Goals have been set at mod I. Anticipated discharge destination is Home .        See Team Conference Notes for weekly updates to the plan of care

## 2023-02-26 NOTE — Progress Notes (Signed)
Inpatient Rehabilitation  Patient information reviewed and entered into eRehab system by Oyuki Hogan M. Eri Mcevers, M.A., CCC/SLP, PPS Coordinator.  Information including medical coding, functional ability and quality indicators will be reviewed and updated through discharge.    
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
## 2023-02-26 NOTE — Progress Notes (Signed)
Occupational Therapy Session Note  Patient Details  Name: Ryan Allison MRN: [id number] Date of Birth: [birth year]  Today's Date: 02/26/2023 OT Individual Time: 1050-1130 OT Individual Time Calculation (min): 40 min    Short Term Goals: Week 1:  OT Short Term Goal 1 (Week 1): STGs = LTGs  Skilled Therapeutic Interventions/Progress Updates:      Therapy Documentation Precautions:  Precautions Precautions: Fall Precaution Comments: ileostomy Restrictions Weight Bearing Restrictions: No General: "It was a pleasure to meet you!" Pt supine in bed upon OT arrival, agreeable to OT session.  Pain: no pain reported  ADL: Bed mobility: SBA supine><seated EOB with flat bed  Exercises: Pt issued UE theraband HEP in order to increase functional strength, andurance and activity tolerance in order to increase independence in ADLs such as bathing. Pt issued yellow theraband and discussed direction/technique of exercises, demonstrating verbal understanding. Pt completed 3x10 exercises at EOB level listed below: -elbow extensions - shoulder horizontal abduction -bicep curls  -shoulder flexion -diagonal shoulder flexion -external rotation   Other Treatments: Pt educated on energy conservation strategies for increased activity tolerance when D/C.   Pt supine in bed with bed alarm activated, 2 bed rails up, call light within reach and 4Ps assessed.   Therapy/Group: Individual Therapy  Velia Meyer, OTD, OTR/L 02/26/2023, 12:54 PM

## 2023-02-26 NOTE — Progress Notes (Signed)
Occupational Therapy Session Note  Patient Details  Name: Ryan Allison MRN: [id number] Date of Birth: [birth year]  Today's Date: 02/26/2023 OT Individual Time: 4098-1191 OT Individual Time Calculation (min): 75 min    Short Term Goals: Week 1:  OT Short Term Goal 1 (Week 1): STGs = LTGs  Skilled Therapeutic Interventions/Progress Updates:    Pt received in bed ready for therapy.  Focus of therapy session on activity tolerance and functional mobility with self care.  Pt agreeable to a shower. Covered L arm IV for pt and then he proceeded to ambulate with RW to bathroom with distant S.  He completed ostomy care at toilet,  undressed, stepped into shower, showered, dried off and ambulated to bed to dress all with set up A only.   Pt does take time in between steps of self care and takes time to ensure his bag is clean.  Pt preferred using the RW and feels this will be easier for him to use at home.  At end of session pt worked on activity tolerance with ambulating with RW in hallway with supervision only for well over 300 ft.  Pt returned to room and resting in recliner. All needs met.       Therapy Documentation Precautions:  Precautions Precautions: Fall Precaution Comments: ileostomy Restrictions Weight Bearing Restrictions: No  Vital Signs: Therapy Vitals Temp: 98 F (36.7 C) Pulse Rate: 68 Resp: 17 BP: (!) 122/90 Patient Position (if appropriate): Lying Oxygen Therapy SpO2: 100 % O2 Device: Room Air Pain:  No c/o pain    Therapy/Group: Individual Therapy  Guido Comp 02/26/2023, 8:27 AM

## 2023-02-26 NOTE — Progress Notes (Signed)
Inpatient Rehabilitation Center Individual Statement of Services  Patient Name:  Ryan Allison  Date:  02/26/2023  Welcome to the Inpatient Rehabilitation Center.  Our goal is to provide you with an individualized program based on your diagnosis and situation, designed to meet your specific needs.  With this comprehensive rehabilitation program, you will be expected to participate in at least 3 hours of rehabilitation therapies Monday-Friday, with modified therapy programming on the weekends.  Your rehabilitation program will include the following services:  Physical Therapy (PT), Occupational Therapy (OT), Speech Therapy (ST), 24 hour per day rehabilitation nursing, Therapeutic Recreaction (TR), Neuropsychology, Care Coordinator, Rehabilitation Medicine, Nutrition Services, Pharmacy Services, and Other  Weekly team conferences will be held on Wednesdays to discuss your progress.  Your Inpatient Rehabilitation Care Coordinator will talk with you frequently to get your input and to update you on team discussions.  Team conferences with you and your family in attendance may also be held.  Expected length of stay: 5-7 Days  Overall anticipated outcome:  MOD I/Supervision  Depending on your progress and recovery, your program may change. Your Inpatient Rehabilitation Care Coordinator will coordinate services and will keep you informed of any changes. Your Inpatient Rehabilitation Care Coordinator's name and contact numbers are listed  below.  The following services may also be recommended but are not provided by the Inpatient Rehabilitation Center:   Home Health Rehabiltiation Services Outpatient Rehabilitation Services    Arrangements will be made to provide these services after discharge if needed.  Arrangements include referral to agencies that provide these services.  Your insurance has been verified to be:   Medicare A & B Your primary doctor is:  Lynnea Ferrier, MD  Pertinent  information will be shared with your doctor and your insurance company.  Inpatient Rehabilitation Care Coordinator:  Lavera Guise, [place] [phone] or [phone]  Information discussed with and copy given to patient by: Andria Rhein, 02/26/2023, 11:37 AM

## 2023-02-27 DIAGNOSIS — Z933 Colostomy status: Secondary | ICD-10-CM | POA: Diagnosis not present

## 2023-02-27 DIAGNOSIS — I613 Nontraumatic intracerebral hemorrhage in brain stem: Secondary | ICD-10-CM | POA: Diagnosis not present

## 2023-02-27 DIAGNOSIS — R7989 Other specified abnormal findings of blood chemistry: Secondary | ICD-10-CM | POA: Diagnosis not present

## 2023-02-27 DIAGNOSIS — I482 Chronic atrial fibrillation, unspecified: Secondary | ICD-10-CM | POA: Diagnosis not present

## 2023-02-27 NOTE — Progress Notes (Addendum)
Patient ID: Ryan Allison, male   DOB: [birth year], 82 y.o.   MRN: [id number]  Sw met with patient to discuss d/c tomorrow. Transportation has been obtained. OP recommendation discussed with patient. OP referral to be submitted to [facility].   No additional questions or concerns.

## 2023-02-27 NOTE — Progress Notes (Signed)
Occupational Therapy Discharge Summary  Patient Details  Name: Ryan Allison MRN: [id number] Date of Birth: 08-23-1940  Date of Discharge from OT service:February 27, 2023  Today's Date: 02/27/2023      Patient has met 6 of 6 long term goals due to improved activity tolerance.  Patient to discharge at overall Modified Independent level.  Patient's care partner is independent to provide the necessary physical assistance at discharge.  Pt education only.    Reasons goals not met: NA  Recommendation:  No further OT services required at this time.   Equipment: No DME  Reasons for discharge: treatment goals met  Patient/family agrees with progress made and goals achieved: Yes  OT Discharge  ADL ADL Eating: Independent Grooming: Independent Upper Body Bathing: Modified independent Where Assessed-Upper Body Bathing: Shower Lower Body Bathing: Modified independent Where Assessed-Lower Body Bathing: Shower Upper Body Dressing: Modified independent (Device) Where Assessed-Upper Body Dressing: Edge of bed Lower Body Dressing: Modified independent Where Assessed-Lower Body Dressing: Edge of bed Toileting: Modified independent Where Assessed-Toileting: Teacher, adult education: Engineer, agricultural Method: Proofreader: Acupuncturist: Cytogeneticist Method: Designer, industrial/product: Grab bars, Sales promotion account executive Baseline Vision/History: 1 Wears glasses Patient Visual Report: No change from baseline Vision Assessment?: No apparent visual deficits Perception  Perception: Within Functional Limits Praxis Praxis: WFL Cognition Cognition Overall Cognitive Status: Within Functional Limits for tasks assessed Arousal/Alertness: Awake/alert Orientation Level: Person;Place;Situation Brief Interview for Mental Status (BIMS) Repetition of Three Words (First Attempt):  3 Temporal Orientation: Year: Correct Temporal Orientation: Month: Accurate within 5 days Temporal Orientation: Day: Correct Recall: "Sock": Yes, no cue required Recall: "Blue": Yes, no cue required Recall: "Bed": No, could not recall BIMS Summary Score: 13 Sensation Sensation Light Touch: Appears Intact Hot/Cold: Appears Intact Proprioception: Appears Intact Coordination Gross Motor Movements are Fluid and Coordinated: Yes Fine Motor Movements are Fluid and Coordinated: Yes Motor  Motor Motor: Within Functional Limits Motor - Discharge Observations: decreased activity tolerance during ADLs but overall motor [facility] Mobility  Bed Mobility Bed Mobility: Supine to Sit;Sit to Supine Supine to Sit: Independent with assistive device Sit to Supine: Independent with assistive device Transfers Sit to Stand: Independent with assistive device Stand to Sit: Independent with assistive device  Trunk/Postural Assessment  Cervical Assessment Cervical Assessment: Exceptions to [facility] (forward head with gaze to floor while standing) Thoracic Assessment Thoracic Assessment: Exceptions to [facility] (severe kyphosis) Lumbar Assessment Lumbar Assessment: Exceptions to [facility] (posterior pelvic tilt) Postural Control Postural Control: Within Functional Limits  Balance Static Sitting Balance Static Sitting - Balance Support: Feet supported Static Sitting - Level of Assistance: 7: Independent Dynamic Sitting Balance Dynamic Sitting - Balance Support: No upper extremity supported;Feet supported Dynamic Sitting - Level of Assistance: 6: Modified independent (Device/Increase time) Static Standing Balance Static Standing - Balance Support: No upper extremity supported;During functional activity Static Standing - Level of Assistance: 7: Independent Dynamic Standing Balance Dynamic Standing - Balance Support: Bilateral upper extremity supported;During functional activity Dynamic Standing - Level of Assistance: 6:  Modified independent (Device/Increase time) Extremity/Trunk Assessment RUE Assessment RUE Assessment: Within Functional Limits LUE Assessment LUE Assessment: Within Functional Limits   Barron Schmid 02/27/2023, 7:39 AM

## 2023-02-27 NOTE — Progress Notes (Signed)
Occupational Therapy Session Note  Patient Details  Name: Ryan Allison MRN: [id number] Date of Birth: 01-28-1941  Today's Date: 02/27/2023 OT Individual Time: 4098-1191 OT Individual Time Calculation (min): 70 min    Short Term Goals: Week 1:  OT Short Term Goal 1 (Week 1): STGs = LTGs  Skilled Therapeutic Interventions/Progress Updates:  Pt greeted seated EOB working on dressing, pt agreeable to OT intervention. Session focused on IADLs and functional mobility.   Transfers/bed mobility: pt completed sit>stands from EOB with no AD with supervision during dressing tasks. MODI for functional ambulation greater than a household distance with RW.   ADLs:   LB dressing: pt donned pants from EOB MODI  Footwear: donned shoes from EOB MODI   IADLS: discussed typical home routine at home with pt reporting he enjoys working in the yard in the summer months, but in the winter he goes on errands with his neighbor and mostly stays at home. Pt reports he goes to the grocery store and makes simple meals at home I.e spaghetti/sandwiches   Pt able to simulate meal prep task by putting items away in cabinets [place] and below knee level, pt completed task MODI with RW. Pt completed functional reaching task in dressers and able to complete laundry task MODI with RW. Education provided on energy conservation techniques for ADLs such as using reacher for laundry task as well as using Rw bag to assist with transporting ADL items to decrease risk of falls. Discussed general fall prevention, I.e decreasing clutter, removing rugs having cell phone close by at all times, having multiple places to sit during functional ambulation, and decreasing frequency of reaching to floor level. Pt additional completed simulated shower transfer to walkin shower with seat with grab bar on L side when entering shower. Pt completed task MODI using grab bars as needed while stepping over threshold.   Ended session with pt seated EOB  with nurse present.              Therapy Documentation Precautions:  Precautions Precautions: Fall Precaution Comments: ileostomy Restrictions Weight Bearing Restrictions: No  Pain: no pain reported    Therapy/Group: Individual Therapy  Pollyann Glen [facility] 02/27/2023, 10:50 AM

## 2023-02-27 NOTE — Discharge Instructions (Addendum)
Inpatient Rehab Discharge Instructions  Ryan Allison Discharge date and time: 02/28/2023   Activities/Precautions/ Functional Status: Activity: no lifting, driving, or strenuous exercise until cleared by MD Diet: cardiac diet Wound Care: none needed Functional status:  ___ No restrictions     ___ Walk up steps independently __x_ 24/7 supervision/assistance   ___ Walk up steps with assistance ___ Intermittent supervision/assistance  ___ Bathe/dress independently ___ Walk with walker     ___ Bathe/dress with assistance ___ Walk Independently    ___ Shower independently ___ Walk with assistance    __x_ Shower with assistance _x__ No alcohol     ___ Return to work/school ________  Special Instructions: No driving, alcohol consumption or tobacco use.  STROKE/TIA DISCHARGE INSTRUCTIONS SMOKING Cigarette smoking nearly doubles your risk of having a stroke & is the single most alterable risk factor  If you smoke or have smoked in the last 12 months, you are advised to quit smoking for your health. Most of the excess cardiovascular risk related to smoking disappears within a year of stopping. Ask you doctor about anti-smoking medications [place] Quit Line: 1-800-QUIT NOW Free Smoking Cessation Classes [phone]  CHOLESTEROL Know your levels; limit fat & cholesterol in your diet  Lipid Panel     Component Value Date/Time   TRIG 63 02/16/2023 0448     Many patients benefit from treatment even if their cholesterol is at goal. Goal: Total Cholesterol (CHOL) less than 160 Goal:  Triglycerides (TRIG) less than 150 Goal:  HDL greater than 40 Goal:  LDL (LDLCALC) less than 100   BLOOD PRESSURE American Stroke Association blood pressure target is less that 120/80 mm/Hg  Your discharge blood pressure is:  BP: 116/68 Monitor your blood pressure Limit your salt and alcohol intake Many individuals will require more than one medication for high blood pressure  DIABETES (A1c is a blood sugar  average for last 3 months) Goal HGBA1c is under 7% (HBGA1c is blood sugar average for last 3 months)  Diabetes: No known diagnosis of diabetes    Lab Results  Component Value Date   HGBA1C 5.5 06/06/2018    Your HGBA1c can be lowered with medications, healthy diet, and exercise. Check your blood sugar as directed by your physician Call your physician if you experience unexplained or low blood sugars.  PHYSICAL ACTIVITY/REHABILITATION Goal is 30 minutes at least 4 days per week  Activity: Increase activity slowly, Therapies: Physical Therapy: Outpatient and Occupational Therapy: Outpatient Return to work: n/a Activity decreases your risk of heart attack and stroke and makes your heart stronger.  It helps control your weight and blood pressure; helps you relax and can improve your mood. Participate in a regular exercise program. Talk with your doctor about the best form of exercise for you (dancing, walking, swimming, cycling).  DIET/WEIGHT Goal is to maintain a healthy weight  Your discharge diet is:  Diet Order             Diet regular Room service appropriate? No; Fluid consistency: Thin  Diet effective now                  thin liquids Your height is:  Height: 5\' 10"  (177.8 cm) Your current weight is: Weight: 68.2 kg Your Body Mass Index (BMI) is:  BMI (Calculated): 21.57 Following the type of diet specifically designed for you will help prevent another stroke. Your goal weight range is:   Your goal Body Mass Index (BMI) is 19-24. Healthy food habits  can help reduce 3 risk factors for stroke:  High cholesterol, hypertension, and excess weight.  RESOURCES Stroke/Support Group:  Call [phone]   STROKE EDUCATION PROVIDED/REVIEWED AND GIVEN TO PATIENT Stroke warning signs and symptoms How to activate emergency medical system (call 911). Medications prescribed at discharge. Need for follow-up after discharge. Personal risk factors for stroke. Pneumonia vaccine given:  No Flu vaccine given: No My questions have been answered, the writing is legible, and I understand these instructions.  I will adhere to these goals & educational materials that have been provided to me after my discharge from the hospital.    My questions have been answered and I understand these instructions. I will adhere to these goals and the provided educational materials after my discharge from the hospital.  Patient/Caregiver Signature _______________________________ Date __________  Clinician Signature _______________________________________ Date __________  Please bring this form and your medication list with you to all your follow-up doctor's appointments.      COMMUNITY REFERRALS UPON DISCHARGE:    Outpatient: PT     OT                Agency: Outpatient Rehabilitation at [facility] Phone: [phone]              Appointment Date/Time: TBD  Medical Equipment/Items Ordered: Levan Hurst                                                 Agency/Supplier: [employer] [phone]

## 2023-02-27 NOTE — Progress Notes (Signed)
Inpatient Rehabilitation Care Coordinator Discharge Note   Patient Details  Name: Ryan Allison MRN: [id number] Date of Birth: Oct 22, 1940   Discharge location: Home  Length of Stay: 5 Days  Discharge activity level: MOD I  Home/community participation: S.O/friends/son  Patient response NW:GNFAOZ Literacy - How often do you need to have someone help you when you read instructions, pamphlets, or other written material from your doctor or pharmacy?: Never  Patient response HY:[id number] Isolation - How often do you feel lonely or isolated from those around you?: Never  Services provided included: MD, RD, PT, OT, SLP, CM, RN, TR, Pharmacy, Neuropsych, SW  Financial Services:  Field seismologist Utilized: Medicare    Choices offered to/list presented to: patient  Follow-up services arranged:  DME, Outpatient    Outpatient Servicies: [facility] PT OT DME : RW    Patient response to transportation need: Is the patient able to respond to transportation needs?: Yes In the past 12 months, has lack of transportation kept you from medical appointments or from getting medications?: No In the past 12 months, has lack of transportation kept you from meetings, work, or from getting things needed for daily living?: No   Patient/Family verbalized understanding of follow-up arrangements:  Yes  Individual responsible for coordination of the follow-up plan: self  Confirmed correct DME delivered: Andria Rhein 02/27/2023    Comments (or additional information):    Andria Rhein

## 2023-02-27 NOTE — Plan of Care (Signed)
  Problem: RH Balance Goal: LTG Patient will maintain dynamic standing balance (PT) Description: LTG:  Patient will maintain dynamic standing balance with assistance during mobility activities (PT) Outcome: Completed/Met Flowsheets (Taken 02/24/2023 1751) LTG: Pt will maintain dynamic standing balance during mobility activities with:: Independent with assistive device    Problem: Sit to Stand Goal: LTG:  Patient will perform sit to stand with assistance level (PT) Description: LTG:  Patient will perform sit to stand with assistance level (PT) Outcome: Completed/Met Flowsheets (Taken 02/27/2023 1733) LTG: PT will perform sit to stand in preparation for functional mobility with assistance level: Independent   Problem: RH Bed to Chair Transfers Goal: LTG Patient will perform bed/chair transfers w/assist (PT) Description: LTG: Patient will perform bed to chair transfers with assistance (PT). Outcome: Completed/Met Flowsheets (Taken 02/24/2023 1751) LTG: Pt will perform Bed to Chair Transfers with assistance level: Independent with assistive device    Problem: RH Car Transfers Goal: LTG Patient will perform car transfers with assist (PT) Description: LTG: Patient will perform car transfers with assistance (PT). Outcome: Completed/Met Flowsheets (Taken 02/24/2023 1751) LTG: Pt will perform car transfers with assist:: Set up assist    Problem: RH Ambulation Goal: LTG Patient will ambulate in home environment (PT) Description: LTG: Patient will ambulate in home environment, # of feet with assistance (PT). Outcome: Completed/Met Flowsheets (Taken 02/24/2023 1751) LTG: Pt will ambulate in home environ  assist needed:: Supervision/Verbal cueing LTG: Ambulation distance in home environment: up to 50 feet per bout using LRAD Goal: LTG Patient will ambulate in community environment (PT) Description: LTG: Patient will ambulate in community environment, # of feet with assistance (PT). Outcome:  Completed/Met Flowsheets (Taken 02/24/2023 1751) LTG: Pt will ambulate in community environ  assist needed:: Supervision/Verbal cueing LTG: Ambulation distance in community environment: >250 feet using LRAD   Problem: RH Stairs Goal: LTG Patient will ambulate up and down stairs w/assist (PT) Description: LTG: Patient will ambulate up and down # of stairs with assistance (PT) Outcome: Completed/Met Flowsheets (Taken 02/24/2023 1751) LTG: Pt will ambulate up/down stairs assist needed:: Supervision/Verbal cueing LTG: Pt will  ambulate up and down number of stairs: at least 4 steps using HR setup as per home environment

## 2023-02-27 NOTE — Patient Care Conference (Signed)
Inpatient RehabilitationTeam Conference and Plan of Care Update Date: 02/26/2023   Time:  1636 PM   Patient Name: Ryan Allison      Medical Record Number: [id number]  Date of Birth: 26-Mar-1941 Sex: Male         Room/Bed: 4M06C/4M06C-01 Payor Info: Payor: MEDICARE / Plan: MEDICARE PART A AND B / Product Type: *No Product type* /    Admit Date/Time:  02/23/2023  1:08 PM  Primary Diagnosis:  ICH (intracerebral hemorrhage) [facility])  Hospital Problems: Principal Problem:   ICH (intracerebral hemorrhage) [facility])    Expected Discharge Date: Expected Discharge Date: 02/28/23  Team Members Present: Physician leading conference: Dr. Claudette Laws Social Worker Present: Lavera Guise, BSW Nurse Present: Chana Bode, RN PT Present: Ralph Leyden, PT OT Present: Iran Planas     Current Status/Progress Goal Weekly Team Focus  Bowel/Bladder      Ostomy for bowel management; education ongoing for care of pre-existing ostomy.     Manage bowel and bladder independently    Monitor need for more education with ostomy care; OP referral for WOC clinic  Swallow/Nutrition/ Hydration               ADL's   set up to supervision with shower, dressing, ambulating to toilet; mod I to toilet at [facility] in room and Mod I stand pivot transfer to [facility] from bed or recliner   Mod I overall   general strength and conditioning to increase activity tolerance with ADLs    Mobility   Bed mobility = Mod I/ IND, transfers = Mod I, ambulation = Mod I with RW or no AD for household distances with extra time, supervision with RW/ rollator for longer/ community distances, stairs with supervision.   Mod I/ supervision overall  safety awareness, standing balance, activity tolerance    Communication                Safety/Cognition/ Behavioral Observations               Pain      Tylenol prn    Pain < 4 with prns    Monitor need for and effectiveness of prn meds  Skin      N/a            Discharge Planning:  Patient ready for d/c tomorrow. No barriers.   Team Discussion: Patient is doing well overall post pontine ICH.  Patient on target to meet rehab goals: yes  *See Care Plan and progress notes for long and short-term goals.   Revisions to Treatment Plan:  N/a   Teaching Needs: Safety, medications, dietary modification, transfers. Etc.   Current Barriers to Discharge: Decreased caregiver support  Possible Resolutions to Barriers: OP follow up services DME: RW OP referral to [facility] clinic     Medical Summary Current Status: problems with ileostomy fit as well as excess flatus , BPs controlled, hx Afib off Eliquis due to ICH  Barriers to Discharge: Other (comments)  Barriers to Discharge Comments: ileostomy issues Possible Resolutions to Barriers/Weekly Focus: WOC RN f/u prior to d/c, will need close neuro f/u prefers [facility]   Continued Need for Acute Rehabilitation Level of Care: The patient requires daily medical management by a physician with specialized training in physical medicine and rehabilitation for the following reasons: Direction of a multidisciplinary physical rehabilitation program to maximize functional independence : Yes Medical management of patient stability for increased activity during participation in an intensive rehabilitation regime.: Yes Analysis  of laboratory values and/or radiology reports with any subsequent need for medication adjustment and/or medical intervention. : Yes   I attest that I was present, lead the team conference, and concur with the assessment and plan of the team.   Chana Bode B 02/28/2023, 9:18 AM

## 2023-02-27 NOTE — Progress Notes (Signed)
PROGRESS NOTE   Subjective/Complaints:  Appreciate WOC RN note, pt exploring options for managing excess flatus in ileostomy bag, WOC f/u planned  DIscussed discharge f/u recs   ROS: Positives per HPI above. Neg CP, SOB, N/V/D Objective:   No results found. No results for input(s): "WBC", "HGB", "HCT", "PLT" in the last 72 hours.  No results for input(s): "NA", "K", "CL", "CO2", "GLUCOSE", "BUN", "CREATININE", "CALCIUM" in the last 72 hours.   Intake/Output Summary (Last 24 hours) at 02/27/2023 0733 Last data filed at 02/27/2023 0700 Gross per 24 hour  Intake 929 ml  Output 650 ml  Net 279 ml        Physical Exam: Vital Signs Blood pressure 116/68, pulse 89, temperature 97.7 F (36.5 C), resp. rate 16, height 5\' 10"  (1.778 m), weight 68.2 kg, SpO2 100%.    General: No acute distress Mood and affect are appropriate Heart: Irregular rate and rhythm no rubs murmurs or extra sounds Lungs: Clear to auscultation, breathing unlabored, no rales or wheezes Abdomen: Positive bowel sounds, soft nontender to palpation, nondistended Extremities: No clubbing, cyanosis, or edema Skin: No evidence of breakdown, no evidence of rash Neurologic: Cranial nerves II through XII intact, motor strength is 5/5 in bilateral deltoid, bicep, tricep, grip, hip flexor, knee extensors, ankle dorsiflexor and plantar flexor Sensory exam normal sensation to light touch and proprioception in bilateral upper and lower extremities Neg dysdiadochokinesis with Sup/Pron of UEs Musculoskeletal: Kyphotic posture Full range of motion in all 4 extremities. No joint swelling   Assessment/Plan: 1. Functional deficits which require 3+ hours per day of interdisciplinary therapy in a comprehensive inpatient rehab setting. Physiatrist is providing close team supervision and 24 hour management of active medical problems listed below. Physiatrist and rehab team  continue to assess barriers to discharge/monitor patient progress toward functional and medical goals  Care Tool:  Bathing    Body parts bathed by patient: Right arm, Left arm, Chest, Abdomen, Front perineal area, Buttocks, Right upper leg, Left upper leg, Face, Left lower leg, Right lower leg         Bathing assist Assist Level: Supervision/Verbal cueing     Upper Body Dressing/Undressing Upper body dressing   What is the patient wearing?: Pull over shirt    Upper body assist Assist Level: Set up assist    Lower Body Dressing/Undressing Lower body dressing      What is the patient wearing?: Pants     Lower body assist Assist for lower body dressing: Minimal Assistance - Patient > 75%     Toileting Toileting    Toileting assist Assist for toileting: Independent with assistive device     Transfers Chair/bed transfer  Transfers assist     Chair/bed transfer assist level: Supervision/Verbal cueing     Locomotion Ambulation   Ambulation assist      Assist level: Contact Guard/Touching assist   Max distance: 220 ft   Walk 10 feet activity   Assist     Assist level: Contact Guard/Touching assist Assistive device: No Device   Walk 50 feet activity   Assist    Assist level: Contact Guard/Touching assist Assistive device: Walker-rolling    Walk 150  feet activity   Assist    Assist level: Supervision/Verbal cueing Assistive device: Walker-rolling    Walk 10 feet on uneven surface  activity   Assist Walk 10 feet on uneven surfaces activity did not occur: Safety/medical concerns         Wheelchair     Assist Is the patient using a wheelchair?: No             Wheelchair 50 feet with 2 turns activity    Assist            Wheelchair 150 feet activity     Assist          Blood pressure 116/68, pulse 89, temperature 97.7 F (36.5 C), resp. rate 16, height 5\' 10"  (1.778 m), weight 68.2 kg, SpO2 100%.     1. 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 ?small infarct with hemorrhagic transformation             -patient may  shower             -ELOS/Goals: 10/15, mod I with PT and OT, supervision to mod I with SLP F/u with neuro regarding resumption of anticoag- pt would like to see Dr Sherryll Burger at [place] [facility]    2.  Antithrombotics: -DVT/anticoagulation:  Mechanical:  Antiembolism stockings, knee (TED hose) Bilateral lower extremities -eliquis held d/t bleed- F/u Neuro Dr Sherryll Burger and Cards Dr Mariah Milling             -antiplatelet therapy: holding also d/t bleed   3. Pain Management: Tylenol as needed   4. Mood/Behavior/Sleep: LCSW to evaluate and provide emotional support             -antipsychotic agents: n/a  - 10/12: Add trazodone 25 mg at bedtime PRN for sleep-improved   5. Neuropsych/cognition: This patient is capable of making decisions on his own behalf.   6. Skin/Wound Care: Routine skin care checks.  Ostomy RN to eval , start low residual diet    7. Fluids/Electrolytes/Nutrition: Routine Is and Os and follow-up chemistries             -continue MVI, Ensure             -pt states that his appetite is reasonable   - Admission labs stable   8: Hypertension: monitor TID and prn             -continue metoprolol tartrate 12.5 mg BID             Controlled 10/15    02/26/2023    7:42 PM 02/26/2023    1:00 PM 02/26/2023    8:41 AM  Vitals with BMI  Systolic 116 119 409  Diastolic 68 64 88  Pulse 89 97 71     9: Hyperlipidemia: continue statin   10: GERD: continue PPI   11: Paroxysmal atrial fib/flutter: Eliquis held due to ICH; on BB             -on reduced dose of Eliquis due to prior GI bleeding (does not desire Watchman procedure)             -follows with Dr. Mariah Milling  -Heart rate well-controlled   12: CKD stage III: elevated BUN (baseline creatinine 0.8-1.0)             -follow-up BMP Monday-- admission  labs stable   13: Recent left foot surgery: hammer toe correction 8/23             -  follow-up Dr. 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 While the ostomy generally stays sealed, it now sits very low on his belly, and the bag literally hangs between his legs. Perhaps some kind of strap or different bag could help.    16: OSA on CPAP-continue use at nighttime.   17: Chronic diastolic CHF: monitor volume status/daily weight   - no signs volume overload; monitor -Weight stable/downtrending Filed Weights   02/23/23 1409 02/25/23 0703  Weight: 72 kg 68.2 kg     LOS: 4 days A FACE TO FACE EVALUATION WAS PERFORMED  Erick Colace 02/27/2023, 7:33 AM

## 2023-02-27 NOTE — Progress Notes (Signed)
Patient ID: Ryan Allison, male   DOB: August 26, 1940, 82 y.o.   MRN: [id number]   RW ordered through [employer].

## 2023-02-27 NOTE — Discharge Summary (Signed)
Physician Discharge Summary  Patient ID: Ryan Allison MRN: [id number] DOB/AGE: 1941/02/13 82 y.o.  Admit date: 02/23/2023 Discharge date: 02/28/2023  Discharge Diagnoses:  Principal Problem:   ICH (intracerebral hemorrhage) (HCC) Active problems: Functional deficits secondary to intracerebral hemorrhage Hypertension Hyperlipidemia Paroxysmal atrial fibrillation Chronic kidney disease stage III History of prostate cancer Status post colectomy/end colostomy with stoma issues  Discharged Condition: stable  Significant Diagnostic Studies: none  Labs:  Basic Metabolic Panel: Recent Labs  Lab 02/24/23 0721  NA 135  K 4.9  CL 102  CO2 28  GLUCOSE 128*  BUN 33*  CREATININE 1.08  CALCIUM 9.5    CBC: Recent Labs  Lab 02/24/23 0721  WBC 8.1  NEUTROABS 5.9  HGB 14.4  HCT 42.6  MCV 97.9  PLT 231    CBG: Recent Labs  Lab 02/21/23 1955 02/22/23 0732 02/22/23 1734 02/22/23 2023 02/23/23 0828  GLUCAP 116* 84 96 117* 90    Brief HPI:   Ryan Allison is a 82 y.o. male who presented to [facility] ED on 02/14/2023 complaining of near syncope and generalized weakness. 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  Neurology consulted and MRI of the brain obtained and BP controlled with cleviprex infusion. 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 The possibility of a small ischemic infarct with hemorrhagic conversion possibility of a cavernoma that bled possible.  Maintained on Eliquis for atrial fibrillation which was reversed with Andexxa. He presented also with a degree of delirium.  Anticoagulants and antithrombotics held.  Hypomagnesemia corrected.  Transferred to hospitalist service on 10/09.  He has remained stable.  PT/OT evaluated and he was steady upon standing and utilizes UE support on walker in order to don robe with assistance from therapist and spouse. Pt then ambulated out into the hallway and  performed standing exercises in order to improve overall LE strength.    Hospital Course: Ryan Allison was admitted to rehab 02/23/2023 for inpatient therapies to consist of PT, ST and OT at least three hours five days a week. Past admission physiatrist, therapy team and rehab RN have worked together to provide customized collaborative inpatient rehab.  Admission labs stable.  Sleep improved with melatonin and trazodone.  He was seen in consultation by wound ostomy care nurse on 10/14.  He was enrolled in [place] secure start DC program and appliance of patient's choice was ordered.  Information provided for ostomy clinic referral. At discharge, he prefers to resume his tramadol and gabapentin as needed to help with sleep. Trazodone and melatonin discontinued. Ostomy WOC RN followed up on day of discharge. Referral sent to ostomy clinic.  Blood pressures were monitored on TID basis and Toprol tartrate 12.5 mg twice daily continued.  Rehab course: During patient's stay in rehab weekly team conferences were held to monitor patient's progress, set goals and discuss barriers to discharge. At admission, patient required contact-guard assist/supervision and verbal cueing for mobility, supervision with basic self-care skills.  He  has had improvement in activity tolerance, balance, postural control as well as ability to compensate for deficits. He has had improvement in functional use RUE/LUE  and RLE/LLE as well as improvement in awareness. Patient has met 6 of 6 long term goals due to improved activity tolerance.  Patient to discharge at overall Modified Independent level.  Patient's care partner is independent to provide the necessary physical assistance at discharge.  Pt education only.  PT and OT arranged outpt rehab at [facility].  Discharge disposition: 01-Home or Self Care     Diet: Heart healthy  Special Instructions: No driving, alcohol consumption or tobacco use.  Follow-up with neurology  regarding resumption of anticoagulation.  He would like to see Dr. Sherryll Burger for [facility] referral sent.  Close follow-up with Dr. Mariah Milling regarding resumption of AC.  30-35 minutes were spent on discharge planning and discharge summary.  Discharge Instructions     Amb Referral to Ostomy Clinic   Complete by: As directed    Reason for referral modifiers: Consultation for problems such as pouch leaking or skin irritation   Ambulatory referral to Neurology   Complete by: As directed    Discharge patient   Complete by: As directed    Discharge disposition: 01-Home or Self Care   Discharge patient date: 02/28/2023      Allergies as of 02/28/2023       Reactions   Nsaids Other (See Comments)   Diverticular bleed   Tolectin [tolmetin] Other (See Comments)   Diverticular bleed   Cardizem [diltiazem Hcl] Rash   Celebrex [celecoxib] Itching, Rash   Effexor [venlafaxine] Other (See Comments)   Sedation         Medication List     STOP taking these medications    apixaban 2.5 MG Tabs tablet Commonly known as: ELIQUIS   feeding supplement Liqd   loperamide 2 MG capsule Commonly known as: IMODIUM   metoprolol succinate 25 MG 24 hr tablet Commonly known as: TOPROL-XL       TAKE these medications    acetaminophen 325 MG tablet Commonly known as: TYLENOL Take 1-2 tablets (325-650 mg total) by mouth every 4 (four) hours as needed for mild pain (pain score 1-3). What changed:  medication strength how much to take when to take this reasons to take this   gabapentin 600 MG tablet Commonly known as: NEURONTIN Take 1 tablet (600 mg total) by mouth 2 (two) times daily as needed.   metoprolol tartrate 25 MG tablet Commonly known as: LOPRESSOR Take 0.5 tablets (12.5 mg total) by mouth 2 (two) times daily.   multivitamin with minerals Tabs tablet Take 1 tablet by mouth daily.   simvastatin 20 MG tablet Commonly known as: ZOCOR Take 20 mg by mouth at  bedtime.   traMADol 50 MG tablet Commonly known as: ULTRAM Take 50 mg by mouth every 6 (six) hours as needed for moderate pain.        Follow-up Information     Lynnea Ferrier, MD Follow up.   Specialty: Internal Medicine Why: Call the office in 1-2 days to make arrangements for hospital follow-up appointment. Contact information: [address] [place] [address] [phone]         Erick Colace, MD Follow up.   Specialty: Physical Medicine and Rehabilitation Why: As needed Contact information: [address] [place] [address] [phone]         Lonell Face, MD Follow up.   Specialty: Neurology Why: Call the office in 1-2 days to make arrangements for hospital follow-up appointment. Contact information: [address] [facility] [place] [address] [phone]         [facility] Follow up.   Specialty: General Surgery Contact information: [address] [place] [address] [phone]        Antonieta Iba, MD Follow up.   Specialty:  Cardiology Why: Call the office in 1-2 days to make arrangements for hospital follow-up appointment. Contact information: [address] [place] [address] [phone]                 Signed: Milinda Antis 02/28/2023, 9:30 AM

## 2023-02-27 NOTE — Progress Notes (Signed)
Physical Therapy Discharge Summary  Patient Details  Name: Ryan Allison MRN: [id number] Date of Birth: June 05, 1940  Date of Discharge from PT service:February 27, 2023  Today's Date: 02/27/2023 PT Individual Time: 4098-1191 PT Individual Time Calculation (min): 70 min    Patient has met 8 of 8 long term goals due to improved activity tolerance, improved balance, increased strength, and functional use of  right upper extremity and right lower extremity.  Patient to discharge at an ambulatory level Modified Independent with supervision for longer distances in ambulation using rollator.  Patient's care partner is independent to provide the necessary physical assistance at discharge.  Reasons goals not met: n/a  Recommendation:  Patient will benefit from ongoing skilled PT services in neuro outpatient setting at [facility] with Grier Rocher PT, DPT to continue to advance safe functional mobility, address ongoing impairments in strength, coordination, balance, activity tolerance, safety awareness, and to minimize fall risk.  Equipment: RW  Reasons for discharge: treatment goals met and discharge from hospital  Patient/family agrees with progress made and goals achieved: Yes  PT Discharge Precautions/Restrictions Precautions Precautions: Fall Precaution Comments: ileostomy, mild R hemipareisis Restrictions Weight Bearing Restrictions: No  Pain Pain Assessment Pain Scale: 0-10 Pain Score: 0-No pain Pain Interference Pain Interference Pain Effect on Sleep: 1. Rarely or not at all;2. Occasionally Pain Interference with Therapy Activities: 1. Rarely or not at all Pain Interference with Day-to-Day Activities: 2. Occasionally Vision/Perception  Vision - History Ability to See in Adequate Light: 0 Adequate Perception Perception: Within Functional Limits Praxis Praxis: WFL  Cognition Overall Cognitive Status: Within Functional Limits for tasks assessed Arousal/Alertness:  Awake/alert Orientation Level: Oriented X4 Sustained Attention: Appears intact Selective Attention: Appears intact Memory: Impaired Memory Impairment: Retrieval deficit;Decreased short term memory Awareness: Appears intact Problem Solving: Appears intact Self Monitoring: Appears intact Self Correcting: Appears intact Safety/Judgment: Appears intact Sensation Sensation Light Touch: Appears Intact Coordination Gross Motor Movements are Fluid and Coordinated: Yes Fine Motor Movements are Fluid and Coordinated: Yes Heel Shin Test: DNT Motor  Motor Motor: Within Functional Limits Motor - Discharge Observations: decreased activity tolerance during ADLs and distance mobility but overall motor [facility]  Mobility Bed Mobility Bed Mobility: Supine to Sit;Sit to Supine Supine to Sit: Independent with assistive device Sit to Supine: Independent with assistive device Transfers Transfers: Sit to Stand;Stand to Sit;Stand Pivot Transfers Sit to Stand: Independent Stand to Sit: Independent Stand Pivot Transfers: Independent with assistive device Transfer (Assistive device): Rolling walker Locomotion  Gait Ambulation: Yes Gait Assistance: Supervision/Verbal cueing Gait Distance (Feet): 300 Feet Assistive device: Rolling walker Gait Gait: Yes Gait Pattern: Within Functional Limits Gait velocity: decreased Stairs / Additional Locomotion Stairs: Yes Stairs Assistance: Supervision/Verbal cueing Stair Management Technique: Two rails Number of Stairs: 8 Height of Stairs: 6 Ramp: Supervision/Verbal cueing Curb: Supervision/Verbal cueing Pick up small object from the floor assist level: Supervision/Verbal cueing Wheelchair Mobility Wheelchair Mobility: No  Trunk/Postural Assessment  Cervical Assessment Cervical Assessment: Exceptions to [facility] (forward head with gaze to floor while standing) Thoracic Assessment Thoracic Assessment: Exceptions to [facility] (severe kyphosis) Lumbar  Assessment Lumbar Assessment: Exceptions to [facility] (significant posterior pelvic tilt) Postural Control Postural Control: Within Functional Limits  Balance Balance Balance Assessed: Yes Standardized Balance Assessment Standardized Balance Assessment: Berg Balance Test;Functional Gait Assessment Berg Balance Test Sit to Stand: Able to stand without using hands and stabilize independently Standing Unsupported: Able to stand safely 2 minutes Sitting with Back Unsupported but Feet Supported on Floor or Stool: Able to sit safely and  securely 2 minutes Stand to Sit: Sits safely with minimal use of hands Transfers: Able to transfer safely, definite need of hands Standing Unsupported with Eyes Closed: Able to stand 10 seconds safely Standing Ubsupported with Feet Together: Able to place feet together independently and stand for 1 minute with supervision From Standing, Reach Forward with Outstretched Arm: Can reach forward >12 cm safely (5") From Standing Position, Pick up Object from Floor: Able to pick up shoe safely and easily From Standing Position, Turn to Look Behind Over each Shoulder: Looks behind one side only/other side shows less weight shift Turn 360 Degrees: Able to turn 360 degrees safely but slowly Standing Unsupported, Alternately Place Feet on Step/Stool: Able to stand independently and complete 8 steps >20 seconds Standing Unsupported, One Foot in Front: Able to plae foot ahead of the other independently and hold 30 seconds Standing on One Leg: Able to lift leg independently and hold 5-10 seconds Total Score: 47 Static Sitting Balance Static Sitting - Balance Support: Feet supported Static Sitting - Level of Assistance: 7: Independent Dynamic Sitting Balance Dynamic Sitting - Balance Support: No upper extremity supported;Feet supported Dynamic Sitting - Level of Assistance: 7: Independent Static Standing Balance Static Standing - Balance Support: No upper extremity  supported;During functional activity Static Standing - Level of Assistance: 7: Independent Dynamic Standing Balance Dynamic Standing - Balance Support: Bilateral upper extremity supported;During functional activity Dynamic Standing - Level of Assistance: 6: Modified independent (Device/Increase time) Functional Gait  Assessment Gait assessed : Yes Gait Level Surface: Walks 20 ft in less than 7 sec but greater than 5.5 sec, uses assistive device, slower speed, mild gait deviations, or deviates 6-10 in outside of the 12 in walkway width. Change in Gait Speed: Able to change speed, demonstrates mild gait deviations, deviates 6-10 in outside of the 12 in walkway width, or no gait deviations, unable to achieve a major change in velocity, or uses a change in velocity, or uses an assistive device. Gait with Horizontal Head Turns: Performs head turns smoothly with slight change in gait velocity (eg, minor disruption to smooth gait path), deviates 6-10 in outside 12 in walkway width, or uses an assistive device. Gait with Vertical Head Turns: Performs task with slight change in gait velocity (eg, minor disruption to smooth gait path), deviates 6 - 10 in outside 12 in walkway width or uses assistive device Gait and Pivot Turn: Turns slowly, requires verbal cueing, or requires several small steps to catch balance following turn and stop Step Over Obstacle: Is able to step over one shoe box (4.5 in total height) without changing gait speed. No evidence of imbalance. Gait with Narrow Base of Support: Ambulates less than 4 steps heel to toe or cannot perform without assistance. Gait with Eyes Closed: Walks 20 ft, uses assistive device, slower speed, mild gait deviations, deviates 6-10 in outside 12 in walkway width. Ambulates 20 ft in less than 9 sec but greater than 7 sec. Ambulating Backwards: Walks 20 ft, uses assistive device, slower speed, mild gait deviations, deviates 6-10 in outside 12 in walkway  width. Steps: Alternating feet, must use rail. Total Score: 17 Extremity Assessment  RUE Assessment RUE Assessment: Within Functional Limits LUE Assessment LUE Assessment: Within Functional Limits RLE Assessment RLE Assessment: Exceptions to [facility] RLE Strength RLE Overall Strength: Deficits Right Hip Flexion: 4-/5 Right Hip Extension: 4/5 Right Hip ABduction: 4+/5 Right Hip ADduction: 4/5 Right Knee Flexion: 4-/5 (strength increases into flexion) Right Knee Extension: 4+/5 Right Ankle Dorsiflexion: 5/5  Right Ankle Plantar Flexion: 4+/5 LLE Assessment LLE Assessment: Within Functional Limits LLE Strength Left Hip Flexion: 4+/5 Left Hip Extension: 4/5 Left Hip ABduction: 4+/5 Left Hip ADduction: 4/5 Left Knee Flexion: 4/5 Left Knee Extension: 4+/5 Left Ankle Dorsiflexion: 5/5 Left Ankle Plantar Flexion: 4+/5  Skilled Intervention: PT guided pt in Grad day assessment to measure progress toward goals. See above for details re: improvement in activity with average gait distance improving from 220 ft to 300 ft as well as outcome measure scores for Berg Balance Test and Functional Gait Assessment.   CARETool mobility assessment  also completed; see CAREtool tab in navigator for details.  Msg sent to [facility] nurse for patient of topics to cover in her next visit with him tomorrow morning prior to d/c.   Pt ready to return home with daily supervision as needed from significant other/ neighbor.    Loel Dubonnet PT, DPT, CSRS 02/27/2023, 7:55 AM

## 2023-02-27 NOTE — Progress Notes (Signed)
Physical Therapy Session Note  Patient Details  Name: Ryan Allison MRN: [id number] Date of Birth: 04/04/1941  Today's Date: 02/27/2023 PT Individual Time: 0904-1008 PT Individual Time Calculation (min): 64 min   Short Term Goals: Week 1:  PT Short Term Goal 1 (Week 1): STG = LTG d/t ELOS  Skilled Therapeutic Interventions/Progress Updates:  Patient supine in bed on entrance to room. Patient alert and agreeable to PT session.   Patient with no pain complaint at start of session.  Therapeutic Activity: Bed Mobility: Pt performed supine <> sit with IND. No vc required. Transfers: Pt performed sit<>stand and stand pivot transfers throughout session with Mod I using RW. No vc required for technique.  Gait Training:  Pt ambulated 300 ft x2 using RW with supervision for fatigue. Demonstrated consistent pace, adequate step height/ length. Provided with increased height on RW for improved posturing.  VC for level gaze and holding head erect.   Neuromuscular Re-ed: NMR facilitated during session with focus on standing balance, motor control and activity tolerance. Pt guided in progressive reach to floor for 4# weighted ball on 8" step x1, then 3# ball on 6" step x2, then 2# weighted ball on 4" step x3 and then 1# weight ball on 2" step x4. Seated rest break, then performed in reverse. NMR performed for improvements in motor control and coordination, balance, sequencing, judgement, activity tolerance, and self confidence/ efficacy in performing all aspects of mobility at highest level of independence.   MMT assessment completed.   Patient supine in bed at end of session with brakes locked, bed alarm set, and all needs within reach.   Therapy Documentation Precautions:  Precautions Precautions: Fall Precaution Comments: ileostomy Restrictions Weight Bearing Restrictions: No  Pain:  No pain related this session, but does relate back discomfort d/t OA. Addressed with repositioning and  improved relaxation to spinal musculature.   Therapy/Group: Individual Therapy  Loel Dubonnet PT, DPT, CSRS 02/27/2023, 7:55 AM

## 2023-02-28 ENCOUNTER — Other Ambulatory Visit (HOSPITAL_COMMUNITY): Payer: Self-pay

## 2023-02-28 DIAGNOSIS — I613 Nontraumatic intracerebral hemorrhage in brain stem: Secondary | ICD-10-CM | POA: Diagnosis not present

## 2023-02-28 MED ORDER — METOPROLOL TARTRATE 25 MG PO TABS
12.5000 mg | ORAL_TABLET | Freq: Two times a day (BID) | ORAL | 0 refills | Status: DC
  Filled 2023-02-28: qty 30, 30d supply, fill #0

## 2023-02-28 MED ORDER — ACETAMINOPHEN 325 MG PO TABS: 325.0000 mg | ORAL_TABLET | ORAL | Status: AC | PRN

## 2023-02-28 NOTE — Progress Notes (Signed)
Patient ID: Ryan Allison, male   DOB: 1940/09/07, 82 y.o.   MRN: [id number]  SW met with patient to discuss discharge questions or concerns. Patient has confirmed transportation and anticipates his transportation to arrive around 10 AM. No additional questions or concerns.

## 2023-02-28 NOTE — Progress Notes (Signed)
Inpatient Rehabilitation Discharge Medication Review by a Pharmacist  A complete drug regimen review was completed for this patient to identify any potential clinically significant medication issues.  High Risk Drug Classes Is patient taking? Indication by Medication  Antipsychotic No   Anticoagulant No   Antibiotic No   Opioid Yes Tramadol- acute pain  Antiplatelet No   Hypoglycemics/insulin No   Vasoactive Medication Yes Lopressor- HTN  Chemotherapy No   Other Yes Zocor- HLD Gabapentin- neuropathic pain     Type of Medication Issue Identified Description of Issue Recommendation(s)  Drug Interaction(s) (clinically significant)     Duplicate Therapy     Allergy     No Medication Administration End Date     Incorrect Dose     Additional Drug Therapy Needed     Significant med changes from prior encounter (inform family/care partners about these prior to discharge).    Other       Clinically significant medication issues were identified that warrant physician communication and completion of prescribed/recommended actions by midnight of the next day:  No   Time spent performing this drug regimen review (minutes):  30   Jumaane Weatherford BS, PharmD, BCPS Clinical Pharmacist 02/28/2023 9:57 AM  Contact: [phone] after 3 PM  "Be curious, not judgmental..." -Debbora Dus

## 2023-02-28 NOTE — Consult Note (Signed)
WOC Nurse ostomy follow up Discharging today.  Recommending follow up in ostomy clinic. Patient will try when he can arrange transportation. Provided with clinic information Stoma type/location: RLQ ileostomy PAtient has been wearing 1 piece convex and does not like it at this time.  The pouch feels shorter to him and he feels the velcro closure pinches his leg .  I suggest it may just be this one pouch and the placement of the pouch (pointing slightly inward) as the size of the pouch and the closure are the same as the 2 piece he was using.  THere is one more 1 piece convex in is discharge supplies, I suggest he try this one next if still interested in one piece.  He will consider.  He would like to switch to a different supply company, other than [employer] due to issues with communication.  I will set him up with [employer] (he requests his old supplies 2 piece with barrier ring and still wants to try a belt)  I provide him with written materials on clinic and life with an ostomy.  He will follow up as needed.  Discharging today.  Will not follow at this time.  Please re-consult if needed.  Mike Gip MSN, RN, FNP-BC CWON Wound, Ostomy, Continence Nurse Outpatient [facility] [phone] Pager [phone]

## 2023-02-28 NOTE — Progress Notes (Signed)
PROGRESS NOTE   Subjective/Complaints:  Discussed need for close neuro and cards f/u regarding resumption of anticoag in setting of Afib and ICH   Also discussed no driving until cleared by Neuro   ROS: Positives per HPI above. Neg CP, SOB, N/V/D Objective:   No results found. No results for input(s): "WBC", "HGB", "HCT", "PLT" in the last 72 hours.  No results for input(s): "NA", "K", "CL", "CO2", "GLUCOSE", "BUN", "CREATININE", "CALCIUM" in the last 72 hours.   Intake/Output Summary (Last 24 hours) at 02/28/2023 0750 Last data filed at 02/28/2023 0555 Gross per 24 hour  Intake 358 ml  Output 400 ml  Net -42 ml        Physical Exam: Vital Signs Blood pressure 111/63, pulse 63, temperature 97.9 F (36.6 C), resp. rate 17, height 5\' 10"  (1.778 m), weight 68.6 kg, SpO2 98%.    General: No acute distress Mood and affect are appropriate Heart: Irregular rate and rhythm no rubs murmurs or extra sounds Lungs: Clear to auscultation, breathing unlabored, no rales or wheezes Abdomen: Positive bowel sounds, soft nontender to palpation, nondistended Extremities: No clubbing, cyanosis, or edema Skin: No evidence of breakdown, no evidence of rash Neurologic: Cranial nerves II through XII intact, motor strength is 5/5 in bilateral deltoid, bicep, tricep, grip, hip flexor, knee extensors, ankle dorsiflexor and plantar flexor Sensory exam normal sensation to light touch and proprioception in bilateral upper and lower extremities  Musculoskeletal: Kyphotic posture Full range of motion in all 4 extremities. No joint swelling   Assessment/Plan: 1. 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 Stable for D/C today F/u PCP in 1-2 weeks F/u Dr Sherryll Burger Neuro  2-3 weeks  F/u Dr Mariah Milling Cardiology  2-3 weeks See D/C summary See D/C instructions   Care Tool:  Bathing    Body parts bathed by patient: Right arm, Left arm, Chest,  Abdomen, Front perineal area, Buttocks, Right upper leg, Left upper leg, Face, Left lower leg, Right lower leg         Bathing assist Assist Level: Independent with assistive device     Upper Body Dressing/Undressing Upper body dressing   What is the patient wearing?: Pull over shirt    Upper body assist Assist Level: Independent    Lower Body Dressing/Undressing Lower body dressing      What is the patient wearing?: Pants     Lower body assist Assist for lower body dressing: Independent with assitive device     Toileting Toileting    Toileting assist Assist for toileting: Independent with assistive device     Transfers Chair/bed transfer  Transfers assist     Chair/bed transfer assist level: Independent with assistive device Chair/bed transfer assistive device: Arboriculturist assist      Assist level: Supervision/Verbal cueing Assistive device: Walker-rolling Max distance: 300 ft   Walk 10 feet activity   Assist     Assist level: Independent with assistive device Assistive device: Walker-rolling   Walk 50 feet activity   Assist    Assist level: Supervision/Verbal cueing Assistive device: Walker-rolling    Walk 150 feet activity   Assist    Assist  level: Supervision/Verbal cueing Assistive device: Walker-rolling, Rollator    Walk 10 feet on uneven surface  activity   Assist Walk 10 feet on uneven surfaces activity did not occur: Safety/medical concerns   Assist level: Supervision/Verbal cueing Assistive device: Walker-rolling, Rollator   Wheelchair     Assist Is the patient using a wheelchair?: No   Wheelchair activity did not occur: N/A         Wheelchair 50 feet with 2 turns activity    Assist    Wheelchair 50 feet with 2 turns activity did not occur: N/A       Wheelchair 150 feet activity     Assist  Wheelchair 150 feet activity did not occur: N/A       Blood  pressure 111/63, pulse 63, temperature 97.9 F (36.6 C), resp. rate 17, height 5\' 10"  (1.778 m), weight 68.6 kg, SpO2 98%.    1. 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 ?small infarct with hemorrhagic transformation             -patient may  shower             -ELOS/Goals: 10/15, mod I with PT and OT, supervision to mod I with SLP F/u with neuro regarding resumption of anticoag- pt would like to see Dr Sherryll Burger at [place] [facility]  No need for PMR f/u given mild fxnl deficits   2.  Antithrombotics: -DVT/anticoagulation:  Mechanical:  Antiembolism stockings, knee (TED hose) Bilateral lower extremities -eliquis held d/t bleed- F/u Neuro Dr Sherryll Burger and Cards Dr Mariah Milling             -antiplatelet therapy: holding also d/t bleed   3. Pain Management: Tylenol as needed   4. Mood/Behavior/Sleep: LCSW to evaluate and provide emotional support             -antipsychotic agents: n/a  - 10/12: Add trazodone 25 mg at bedtime PRN for sleep-improved   5. Neuropsych/cognition: This patient is capable of making decisions on his own behalf.   6. Skin/Wound Care: Routine skin care checks.  Ostomy RN to eval , start low residual diet    7. Fluids/Electrolytes/Nutrition: Routine Is and Os and follow-up chemistries             -continue MVI, Ensure             -pt states that his appetite is reasonable   - Admission labs stable   8: Hypertension: monitor TID and prn             -continue metoprolol tartrate 12.5 mg BID             Controlled 10/16    02/28/2023    5:25 AM 02/28/2023    5:00 AM 02/27/2023    7:56 PM  Vitals with BMI  Weight  151 lbs 4 oz   BMI  21.7   Systolic 111  109  Diastolic 63  66  Pulse 63  79     9: Hyperlipidemia: continue statin   10: GERD: continue PPI   11: Paroxysmal atrial fib/flutter: Eliquis held due to ICH; on BB             -on reduced dose of Eliquis due to prior GI bleeding (does not  desire Watchman procedure)             -follows with Dr. Mariah Milling  -Heart rate well-controlled   12: CKD stage III:  elevated BUN (baseline creatinine 0.8-1.0)             -follow-up BMP Monday-- admission labs stable   13: Recent left foot surgery: hammer toe correction 8/23             -follow-up Dr. 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 While the ostomy generally stays sealed, it now sits very low on his belly, and the bag literally hangs between his legs. Perhaps some kind of strap or different bag could help.    16: OSA on CPAP-continue use at nighttime.   17: Chronic diastolic CHF: monitor volume status/daily weight   - no signs volume overload; monitor -Weight stable/downtrending Filed Weights   02/23/23 1409 02/25/23 0703 02/28/23 0500  Weight: 72 kg 68.2 kg 68.6 kg     LOS: 5 days A FACE TO FACE EVALUATION WAS PERFORMED  Victorino Sparrow Kena Limon 02/28/2023, 7:50 AM

## 2023-02-28 NOTE — Progress Notes (Signed)
Patient provided discharge instructions by PA. Medications received from TOC. Staff assisted patient off the unit, patient discharged safely with family member via private car.     Tilden Dome, LPN

## 2023-03-01 NOTE — Progress Notes (Signed)
Patient ID: Ryan Allison, male   DOB: [birth year], 82 y.o.   MRN: [id number]  OP referral and d/c faxed to [facility].
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
## 2023-03-12 ENCOUNTER — Ambulatory Visit: Payer: Medicare Other | Attending: Cardiovascular Disease | Admitting: Cardiovascular Disease

## 2023-03-12 ENCOUNTER — Encounter: Payer: Self-pay | Admitting: Cardiovascular Disease

## 2023-03-12 VITALS — BP 130/80 | HR 82 | Ht 69.0 in | Wt 151.4 lb

## 2023-03-12 DIAGNOSIS — I480 Paroxysmal atrial fibrillation: Secondary | ICD-10-CM | POA: Diagnosis not present

## 2023-03-12 DIAGNOSIS — I4892 Unspecified atrial flutter: Secondary | ICD-10-CM | POA: Insufficient documentation

## 2023-03-12 DIAGNOSIS — I700 Atherosclerosis of aorta: Secondary | ICD-10-CM | POA: Insufficient documentation

## 2023-03-12 DIAGNOSIS — D62 Acute posthemorrhagic anemia: Secondary | ICD-10-CM | POA: Insufficient documentation

## 2023-03-12 DIAGNOSIS — E782 Mixed hyperlipidemia: Secondary | ICD-10-CM | POA: Insufficient documentation

## 2023-03-12 DIAGNOSIS — K922 Gastrointestinal hemorrhage, unspecified: Secondary | ICD-10-CM | POA: Insufficient documentation

## 2023-03-12 DIAGNOSIS — G4733 Obstructive sleep apnea (adult) (pediatric): Secondary | ICD-10-CM | POA: Insufficient documentation

## 2023-03-12 NOTE — Patient Instructions (Signed)
Medication Instructions:  ?No changes ? ?If you need a refill on your cardiac medications before your next appointment, please call your pharmacy.  ? ?Lab work: ?No new labs needed ? ?Testing/Procedures: ?No new testing needed ? ?Follow-Up: ?At [facility], you and your health needs are our priority.  As part of our continuing mission to provide you with exceptional heart care, we have created designated Provider Care Teams.  These Care Teams include your primary Cardiologist (physician) and Advanced Practice Providers (APPs -  Physician Assistants and Nurse Practitioners) who all work together to provide you with the care you need, when you need it. ? ?You will need a follow up appointment in 6 months, APP ok ? ?Providers on your designated Care Team:   ?Christopher Berge, NP ?Ryan Dunn, PA-C ?Cadence Furth, PA-C ? ?COVID-19 Vaccine Information can be found at: [email or web address] For questions related to vaccine distribution or appointments, please email [email or web address] or call [phone].  ? ?

## 2023-03-13 ENCOUNTER — Telehealth: Payer: Self-pay | Admitting: Cardiovascular Disease

## 2023-03-13 MED ORDER — METOPROLOL TARTRATE 25 MG PO TABS: 12.5000 mg | ORAL_TABLET | Freq: Two times a day (BID) | ORAL | 1 refills | Status: DC

## 2023-03-13 NOTE — Telephone Encounter (Signed)
*  STAT* If patient is at the pharmacy, call can be transferred to refill team.   1. Which medications need to be refilled? (please list name of each medication and dose if known) new prescription for Metoprolol   2. Would you like to learn more about the convenience, safety, & potential cost savings by using the [facility]?    3. Are you open to using the [facility] (Type [facility]   4. Which pharmacy/location (including street and city if local pharmacy) is medication to be sent to?[facility] [place]  5. Do they need a 30 day or 90 day supply? 90 days and refills

## 2023-03-19 ENCOUNTER — Telehealth: Payer: Self-pay | Admitting: Cardiovascular Disease

## 2023-03-19 MED ORDER — METOPROLOL TARTRATE 25 MG PO TABS: 12.5000 mg | ORAL_TABLET | Freq: Two times a day (BID) | ORAL | 3 refills | Status: DC

## 2023-03-19 NOTE — Telephone Encounter (Signed)
Pt c/o medication issue:  1. Name of Medication:   metoprolol tartrate (LOPRESSOR) 25 MG tablet    2. How are you currently taking this medication (dosage and times per day)? Take 0.5 tablets (12.5 mg total) by mouth 2 (two) times daily.   3. Are you having a reaction (difficulty breathing--STAT)? No  4. What is your medication issue? Patient is calling because he is alittle confused about his medication. Patient states he is almost out of medication. Patient stated he went to [facility] and was unable to get it. [facility]. Patient is requesting a call back.   Patient mentioned Dr. Mariah Milling was wanting to know if the neurologist was going to put the patient on Eliquis. Patient stated the neurologist put the patient on a baby aspirin of 81 MG and informed the patient to wait until his next appointment with Dr. Mariah Milling to have the Dr. Mariah Milling put the patient on Eliquis.

## 2023-03-19 NOTE — Telephone Encounter (Signed)
Patient needed refill of his metoprolol refilled from his hospital stay. Refill was sent into his pharmacy requested.   Dr. Mariah Milling told patient to let him know what neurologist said at his visit with them. He states that the neurologist instructed patient to take ASA 81 mg and that the next time he sees Dr. Mariah Milling have him prescribe the Eliquis. Advised that I would forward this message over to Dr. Mariah Milling and we would be in touch with his recommendations.   He was appreciative for the call with no further questions at this time.

## 2023-03-20 ENCOUNTER — Telehealth: Payer: Self-pay | Admitting: Cardiovascular Disease

## 2023-03-20 NOTE — Telephone Encounter (Signed)
Called patient and notified him of the following from Dr. Mariah Milling.  I have read the notes from neurology, Dr. Sherryll Burger They did not answer the question whether to restart Eliquis 2.5 twice daily now We can send in a prescription for Eliquis 2.5 twice daily, he can start it but would stop the aspirin Would recommend he put a final phone call into Dr. Sherryll Burger saying that he has Eliquis prescription 2.5 twice daily and that unless Dr. Clelia Croft has concerns, he will start the Eliquis and stop aspirin Thx TGollan        Patient verbalizes understanding. Patient states that he will call Dr. Margaretmary Eddy office for clarification.

## 2023-03-20 NOTE — Telephone Encounter (Signed)
Patient returned RN Pam's call (see note dated 11/4) and stated Molli Hazard in Dr. Alver Fisher office stated patient should start taking a 81 mg aspirin and to check with Dr. Mariah Milling on when to start Eliquis.

## 2023-03-22 NOTE — Telephone Encounter (Signed)
Pt calling back to f/u on as to when he is to start Eliquis. He would like a return call from nurse. Please advise

## 2023-03-23 MED ORDER — APIXABAN 2.5 MG PO TABS: 2.5000 mg | ORAL_TABLET | Freq: Two times a day (BID) | ORAL | 3 refills | Status: DC

## 2023-03-23 NOTE — Telephone Encounter (Signed)
Called patient and notified him of the following from Dr. Mariah Milling.  It would appear note from neurology would like him to stay on aspirin 81 daily  Would recommend he restart Eliquis 2.5 twice daily for cardiac arrhythmia  Thx  TGollan   Patient verbalizes understanding. Prescription sent to patient's preferred pharmacy.

## 2023-04-10 ENCOUNTER — Telehealth
Admit: 2023-04-10 | Discharge: 2023-04-11 | Payer: MEDICARE | Attending: Hematology & Oncology | Primary: Hematology & Oncology

## 2023-04-10 DIAGNOSIS — C61 Malignant neoplasm of prostate: Principal | ICD-10-CM

## 2023-04-18 DIAGNOSIS — C61 Malignant neoplasm of prostate: Principal | ICD-10-CM

## 2023-04-18 MED ORDER — BICALUTAMIDE 50 MG TABLET
ORAL_TABLET | Freq: Every day | ORAL | 11 refills | 30 days | Status: CP
Start: 2023-04-18 — End: ?

## 2023-04-20 ENCOUNTER — Encounter: Payer: Self-pay | Admitting: Urology

## 2023-04-20 ENCOUNTER — Ambulatory Visit: Payer: Medicare Other | Admitting: Urology

## 2023-04-20 VITALS — BP 141/87 | HR 89 | Ht 70.0 in | Wt 145.0 lb

## 2023-04-20 DIAGNOSIS — R399 Unspecified symptoms and signs involving the genitourinary system: Secondary | ICD-10-CM | POA: Diagnosis not present

## 2023-04-20 DIAGNOSIS — R3129 Other microscopic hematuria: Secondary | ICD-10-CM

## 2023-04-20 DIAGNOSIS — R310 Gross hematuria: Secondary | ICD-10-CM

## 2023-04-20 DIAGNOSIS — C61 Malignant neoplasm of prostate: Secondary | ICD-10-CM | POA: Diagnosis not present

## 2023-04-20 LAB — URINALYSIS, COMPLETE
Bilirubin, UA: NEGATIVE
Glucose, UA: NEGATIVE
Leukocytes,UA: NEGATIVE
Nitrite, UA: NEGATIVE
Specific Gravity, UA: >1.03 — ABNORMAL HIGH (ref 1.005–1.030)
Urobilinogen, Ur: 0.2 mg/dL (ref 0.2–1.0)
pH, UA: 5.5 (ref 5.0–7.5)

## 2023-04-20 LAB — MICROSCOPIC EXAMINATION: RBC, Urine: >30 /[HPF] — AB (ref 0–2)

## 2023-04-20 NOTE — Progress Notes (Signed)
I, Maysun Anabel Bene, acting as a scribe for Riki Altes, MD., have documented all relevant documentation on the behalf of Riki Altes, MD, as directed by Riki Altes, MD while in the presence of Riki Altes, MD.  04/20/2023 3:01 PM   [name] 1940/06/10 [id number]  Referring provider: Lynnea Ferrier, MD [address] [facility] [place],  [place] [address]  Chief Complaint  Patient presents with   Hematuria   Urologic history: 1.  Lower urinary tract symptoms Obstructive voiding symptoms Orthostatic changes with tamsulosin; no improvement silodosin Cystoscopy 03/11/2020 with moderate lateral lobe enlargement with prominent hypervascularity Follow-up cystoscopy 11/18/2021 with similar findings   2.  Recurrent gross hematuria Cystoscopy as above CT 01/2019 with right nephrolithiasis   3.  Intermediate risk prostate cancer Followed [facility] oncology Initially treated EBRT + ADT 2011 Nodal mets via PET Axumin 2018 Started bicalutamide monotherapy with PSA 33.6; decreased to 0.3 on bicalutamide however this was stopped secondary to questionable side effects 2021 Currently followed [facility] oncology  HPI: KAMAR STEINBACHER is a 82 y.o. male presents for annual follow-up.   He was hospitalized October 2024 with an intracerebral hemorrhage and underwent rehab.  Remains on Eliquis but no gross hematuria since last year's visit.  He has noted some urinary hesitancy, however this is improving. Continues appointments with medical oncology at [facility]. He was taken off bicalutamide, however recently restarted when his PSA was 12.   PMH: Past Medical History:  Diagnosis Date   Amblyopia of right eye    Anxiety    Aortic atherosclerosis (HCC)    Arthritis    Atrial fibrillation and flutter (HCC) 05/2018   a.) CHA2DS2VASc = 4 (age x 2, HTN, vascular disease history); b.) rate/rhythm maintained on oral metoprolol tartrate; chronically anticoagulated with dose  reduced apixaban (experienced hematuria on rivaroxaban)   B12 deficiency    Cervical spondylosis with myelopathy    Chronic kidney disease, stage 3 (HCC)    DDD (degenerative disc disease), lumbar    Diverticulosis    Esophageal dysphagia    External hemorrhoids    GERD (gastroesophageal reflux disease)    GI bleed    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   History of echocardiogram    a.) TTE 06/07/2018: EF 60-65%, no RWMAs, mild MR, PASP 33 mmHg; b.) TTE 09/19/2019: EF 50-55%, mild LVH, mod RVE, mild LA dil, mild-mod TR, mild MR, PASP 42.5; c.) TTE 09/14/2022: EF 60-65%, mod basal-septal LVH, mild MR/AR, mod TR, AoV sclerosis, Ao root 37 mm   History of GI diverticular bleed    a.) required prolonged ICU admission at [facility]   History of kidney stones    HLD (hyperlipidemia)    Hyperlipidemia    Hypertension    Kyphoscoliosis    Meniscus tear    On apixaban therapy    OSA (obstructive sleep apnea)    a.) does not require nocturnal PAP therapy   Osteopenia    Peripheral neuropathy    Prostate cancer (HCC) 2011   PTSD (post-traumatic stress disorder)    RBBB (right bundle branch block)    SBO (small bowel obstruction) (HCC)    Skin cancer of scalp     Surgical History: Past Surgical History:  Procedure Laterality Date   COLECTOMY  07/12/2018   with end ileostomy   COLONOSCOPY Left 06/07/2018   Procedure: COLONOSCOPY;  Surgeon: Pasty Spillers, MD;  Location: [facility] ENDOSCOPY;  Service: Endoscopy;  Laterality:  Left;   COLONOSCOPY WITH ESOPHAGOGASTRODUODENOSCOPY (EGD)  09/12/2010   COLONOSCOPY WITH PROPOFOL N/A 04/06/2015   Procedure: COLONOSCOPY WITH PROPOFOL;  Surgeon: Christena Deem, MD;  Location: [facility] ENDOSCOPY;  Service: Endoscopy;  Laterality: N/A;   COLONOSCOPY WITH PROPOFOL N/A 06/07/2018   Procedure: COLONOSCOPY WITH PROPOFOL;  Surgeon: Pasty Spillers, MD;  Location: [facility] ENDOSCOPY;  Service: Endoscopy;  Laterality: N/A;   HAMMER TOE SURGERY Left 01/05/2023    Procedure: HAMMER TOE CORRECTION; INJECTION OF FAT PAD GRAFT;  Surgeon: [name], DPM;  Location: [facility] ORS;  Service: Podiatry;  Laterality: Left;   INGUINAL HERNIA REPAIR Left 09/23/2014   Procedure: HERNIA REPAIR INGUINAL ADULT;  Surgeon: Kieth Brightly, MD;  Location: [facility] ORS;  Service: General;  Laterality: Left;   LASIK  2000   MENISCUS REPAIR Left 05/15/2006   MOHS SURGERY     right scalp   SKIN GRAFT FULL THICKNESS ARM Right 1966   [place] mine   STRABISMUS SURGERY Right 07/17/2016   VASCULAR SURGERY  05/15/2013   s/p GI Bleed    Home Medications:  Allergies as of 04/20/2023       Reactions   Nsaids Other (See Comments)   Diverticular bleed   Tolectin [tolmetin] Other (See Comments)   Diverticular bleed   Cardizem [diltiazem Hcl] Rash   Celebrex [celecoxib] Itching, Rash   Effexor [venlafaxine] Other (See Comments)   Sedation         Medication List        Accurate as of April 20, 2023  3:01 PM. If you have any questions, ask your nurse or doctor.          acetaminophen 325 MG tablet Commonly known as: TYLENOL Take 1-2 tablets (325-650 mg total) by mouth every 4 (four) hours as needed for mild pain (pain score 1-3).   apixaban 2.5 MG Tabs tablet Commonly known as: ELIQUIS Take 1 tablet (2.5 mg total) by mouth 2 (two) times daily.   gabapentin 600 MG tablet Commonly known as: NEURONTIN Take 1 tablet (600 mg total) by mouth 2 (two) times daily as needed.   metoprolol tartrate 25 MG tablet Commonly known as: LOPRESSOR Take 0.5 tablets (12.5 mg total) by mouth 2 (two) times daily.   multivitamin with minerals Tabs tablet Take 1 tablet by mouth daily.   simvastatin 20 MG tablet Commonly known as: ZOCOR Take 20 mg by mouth at bedtime.   traMADol 50 MG tablet Commonly known as: ULTRAM Take 50 mg by mouth every 6 (six) hours as needed for moderate pain.        Allergies:  Allergies  Allergen Reactions   Nsaids Other (See  Comments)    Diverticular bleed   Tolectin [Tolmetin] Other (See Comments)    Diverticular bleed   Cardizem [Diltiazem Hcl] Rash   Celebrex [Celecoxib] Itching and Rash   Effexor [Venlafaxine] Other (See Comments)    Sedation     Family History: Family History  Problem Relation Age of Onset   Dementia Mother    Cancer Father    Cancer Brother     Social History:  reports that he quit smoking about 53 years ago. His smoking use included cigarettes. He has been exposed to tobacco smoke. He has never used smokeless tobacco. He reports that he does not drink alcohol and does not use drugs.   Physical Exam: BP (!) 141/87   Pulse 89   Ht 5\' 10"  (1.778 m)   Wt 145 lb (  65.8 kg)   BMI 20.81 kg/m   Constitutional:  Alert and oriented, No acute distress. HEENT: [place] AT, moist mucus membranes.  Trachea midline, no masses. Cardiovascular: No clubbing, cyanosis, or edema. Respiratory: Normal respiratory effort, no increased work of breathing. GI: Abdomen is soft, nontender, nondistended, no abdominal masses Skin: No rashes, bruises or suspicious lesions. Neurologic: Grossly intact, no focal deficits, moving all 4 extremities. Psychiatric: Normal mood and affect.  Urinalysis Dipstick 3+ blood/2+ protein, microscopy >30 RBC   Assessment & Plan:    1. Microhematuria Previous history of gross hematuria Microhematuria stable Continue annual follow-up and he will call for gross hematuria.   2. Prostate cancer Regular follow-up scheduled [facility] medical oncology  3. Lower urinary tract symptoms He has previously had intermittent lower urinary tract symptoms that did not improve with tamsulosin or silidosin. He could not tolerate tamsulosin secondary to orthostatic changes.  He will call if his voiding symptoms worsen.  [facility] [address], [address] [place], [place] [address] [phone]

## 2023-04-22 ENCOUNTER — Encounter: Payer: Self-pay | Admitting: Urology

## 2023-05-01 ENCOUNTER — Ambulatory Visit (HOSPITAL_COMMUNITY)
Admission: RE | Admit: 2023-05-01 | Discharge: 2023-05-01 | Disposition: A | Discharge status: Home / Self Care | Payer: Medicare Other | Source: Ambulatory Visit | Attending: Internal Medicine | Admitting: Internal Medicine

## 2023-05-01 DIAGNOSIS — Z433 Encounter for attention to colostomy: Secondary | ICD-10-CM | POA: Insufficient documentation

## 2023-05-01 DIAGNOSIS — Z432 Encounter for attention to ileostomy: Secondary | ICD-10-CM

## 2023-05-01 DIAGNOSIS — L24B3 Irritant contact dermatitis related to fecal or urinary stoma or fistula: Secondary | ICD-10-CM

## 2023-05-01 DIAGNOSIS — R634 Abnormal weight loss: Secondary | ICD-10-CM | POA: Diagnosis not present

## 2023-05-01 DIAGNOSIS — K56609 Unspecified intestinal obstruction, unspecified as to partial versus complete obstruction: Secondary | ICD-10-CM | POA: Diagnosis not present

## 2023-05-01 NOTE — Progress Notes (Signed)
[facility]   Reason for visit:  RLQ end ileostomy, significant weight loss HPI:  Small bowel obstruction with colectomy and end ileostomy Past Medical History:  Diagnosis Date  . Amblyopia of right eye   . Anxiety   . Aortic atherosclerosis (HCC)   . Arthritis   . Atrial fibrillation and flutter (HCC) 05/2018   a.) CHA2DS2VASc = 4 (age x 2, HTN, vascular disease history); b.) rate/rhythm maintained on oral metoprolol tartrate; chronically anticoagulated with dose reduced apixaban (experienced hematuria on rivaroxaban)  . B12 deficiency   . Cervical spondylosis with myelopathy   . Chronic kidney disease, stage 3 (HCC)   . DDD (degenerative disc disease), lumbar   . Diverticulosis   . Esophageal dysphagia   . External hemorrhoids   . GERD (gastroesophageal reflux disease)   . GI bleed   . 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  . History of echocardiogram    a.) TTE 06/07/2018: EF 60-65%, no RWMAs, mild MR, PASP 33 mmHg; b.) TTE 09/19/2019: EF 50-55%, mild LVH, mod RVE, mild LA dil, mild-mod TR, mild MR, PASP 42.5; c.) TTE 09/14/2022: EF 60-65%, mod basal-septal LVH, mild MR/AR, mod TR, AoV sclerosis, Ao root 37 mm  . History of GI diverticular bleed    a.) required prolonged ICU admission at [facility]  . History of kidney stones   . HLD (hyperlipidemia)   . Hyperlipidemia   . Hypertension   . Kyphoscoliosis   . Meniscus tear   . On apixaban therapy   . OSA (obstructive sleep apnea)    a.) does not require nocturnal PAP therapy  . Osteopenia   . Peripheral neuropathy   . Prostate cancer (HCC) 2011  . PTSD (post-traumatic stress disorder)   . RBBB (right bundle branch block)   . SBO (small bowel obstruction) (HCC)   . Skin cancer of scalp    Family History  Problem Relation Age of Onset  . Dementia Mother   . Cancer Father   . Cancer Brother    Allergies  Allergen Reactions  . Nsaids Other (See Comments)    Diverticular bleed  . Tolectin [Tolmetin] Other (See  Comments)    Diverticular bleed  . Cardizem [Diltiazem Hcl] Rash  . Celebrex [Celecoxib] Itching and Rash  . Effexor [Venlafaxine] Other (See Comments)    Sedation    Current Outpatient Medications  Medication Sig Dispense Refill Last Dose/Taking  . acetaminophen (TYLENOL) 325 MG tablet Take 1-2 tablets (325-650 mg total) by mouth every 4 (four) hours as needed for mild pain (pain score 1-3).     Marland Kitchen apixaban (ELIQUIS) 2.5 MG TABS tablet Take 1 tablet (2.5 mg total) by mouth 2 (two) times daily. 180 tablet 3   . gabapentin (NEURONTIN) 600 MG tablet Take 1 tablet (600 mg total) by mouth 2 (two) times daily as needed.     . metoprolol tartrate (LOPRESSOR) 25 MG tablet Take 0.5 tablets (12.5 mg total) by mouth 2 (two) times daily. 90 tablet 3   . Multiple Vitamin (MULTIVITAMIN WITH MINERALS) TABS tablet Take 1 tablet by mouth daily.     . simvastatin (ZOCOR) 20 MG tablet Take 20 mg by mouth at bedtime.      . traMADol (ULTRAM) 50 MG tablet Take 50 mg by mouth every 6 (six) hours as needed for moderate pain.       No current facility-administered medications for this encounter.   ROS  Review of Systems  Constitutional:  Positive for fatigue and  unexpected weight change.  Gastrointestinal:        RLQ ileostomy  Genitourinary:        Followed by urology  Musculoskeletal:  Positive for myalgias.  Skin:  Positive for rash.       Peristomal breakdown due to leaks and frequent pouch changes Weight loss has created sagging skin and changed abdomen around stoma. Will implement ostomy belt and barrier strips to improve seal.   Psychiatric/Behavioral:  Positive for dysphoric mood.        Discouraged with current state of health, leaking pouch and risk for dehydration.    All other systems reviewed and are negative. Vital signs:  BP (!) 166/83 (BP Location: Right Arm)   Pulse 87   Temp 98.1 F (36.7 C) (Oral)   Resp 20   SpO2 99%  Exam:  Physical Exam Vitals reviewed.  HENT:      Mouth/Throat:     Mouth: Mucous membranes are moist.  Abdominal:     Palpations: Abdomen is soft.     Comments: Sagging skin around stoma RLQ ileostomy  Skin:    General: Skin is warm.     Findings: Erythema present.  Neurological:     Mental Status: He is alert and oriented to person, place, and time. Mental status is at baseline.  Psychiatric:        Behavior: Behavior normal.    Stoma type/location:  RLQ ileostomy Stomal assessment/size:  1 1/4" pink and moist Peristomal assessment:  erythema and irritation.  Ongoing leaks at times.  Struggles with weight loss .  Treatment options for stomal/peristomal skin: Did not secure medium ostomy belt and this would likely improve seal and provide more security.  We will update medical supply company.   Output: liquid green stool Ostomy pouching: 2pc.  Convex with barrier ring  stoma powder and skin prep  Add medium ostomy belt.  Education provided:  We perform pouch change  I do not have a medium belt in clinic but will update his orders with [employer].  He has not switched to [place] at this time.  I apply barrier strips to perimeter of barrier adhesive to improve seal.     Impression/dx  Contact dermatitis Ileostomy Risk for dehydration Discussion  Adding ostomy belt and barrier strips would improve seal.  We will update orders with medical supply  Plan  See back as needed.   Update supply order.     Visit time: 55 minutes.   Mike Gip FNP-BC

## 2023-05-10 DIAGNOSIS — L24B3 Irritant contact dermatitis related to fecal or urinary stoma or fistula: Secondary | ICD-10-CM | POA: Insufficient documentation

## 2023-05-10 DIAGNOSIS — Z432 Encounter for attention to ileostomy: Secondary | ICD-10-CM | POA: Insufficient documentation

## 2023-05-10 NOTE — Discharge Instructions (Signed)
2 piece convex pouch  Barrier ring Stoma powder and skin prep Add barrier strips Add medium ostomy belt.  Update [employer]

## 2023-05-14 ENCOUNTER — Other Ambulatory Visit (HOSPITAL_COMMUNITY): Payer: Self-pay | Admitting: Nurse Practitioner

## 2023-05-14 DIAGNOSIS — L24B3 Irritant contact dermatitis related to fecal or urinary stoma or fistula: Secondary | ICD-10-CM

## 2023-05-14 DIAGNOSIS — Z432 Encounter for attention to ileostomy: Secondary | ICD-10-CM

## 2023-06-07 ENCOUNTER — Other Ambulatory Visit: Payer: Self-pay | Admitting: Internal Medicine

## 2023-06-07 DIAGNOSIS — R633 Feeding difficulties, unspecified: Secondary | ICD-10-CM

## 2023-06-07 DIAGNOSIS — I480 Paroxysmal atrial fibrillation: Secondary | ICD-10-CM

## 2023-06-07 DIAGNOSIS — R1312 Dysphagia, oropharyngeal phase: Secondary | ICD-10-CM

## 2023-06-12 DIAGNOSIS — C61 Malignant neoplasm of prostate: Principal | ICD-10-CM

## 2023-06-21 ENCOUNTER — Encounter
Admit: 2023-06-21 | Discharge: 2023-06-22 | Payer: MEDICARE | Attending: Hematology & Oncology | Primary: Hematology & Oncology

## 2023-06-21 DIAGNOSIS — C61 Malignant neoplasm of prostate: Principal | ICD-10-CM

## 2023-06-27 ENCOUNTER — Ambulatory Visit: Payer: Medicare Other

## 2023-07-28 ENCOUNTER — Other Ambulatory Visit: Payer: Self-pay

## 2023-07-28 ENCOUNTER — Emergency Department
Admission: EM | Admit: 2023-07-28 | Discharge: 2023-07-28 | Disposition: A | Discharge status: Home / Self Care | Attending: Emergency Medicine | Admitting: Emergency Medicine

## 2023-07-28 ENCOUNTER — Emergency Department

## 2023-07-28 DIAGNOSIS — R350 Frequency of micturition: Secondary | ICD-10-CM | POA: Diagnosis present

## 2023-07-28 DIAGNOSIS — Z7901 Long term (current) use of anticoagulants: Secondary | ICD-10-CM | POA: Diagnosis not present

## 2023-07-28 DIAGNOSIS — N189 Chronic kidney disease, unspecified: Secondary | ICD-10-CM | POA: Diagnosis not present

## 2023-07-28 DIAGNOSIS — R5383 Other fatigue: Secondary | ICD-10-CM | POA: Insufficient documentation

## 2023-07-28 DIAGNOSIS — R6883 Chills (without fever): Secondary | ICD-10-CM | POA: Diagnosis not present

## 2023-07-28 DIAGNOSIS — I129 Hypertensive chronic kidney disease with stage 1 through stage 4 chronic kidney disease, or unspecified chronic kidney disease: Secondary | ICD-10-CM | POA: Insufficient documentation

## 2023-07-28 LAB — URINALYSIS, ROUTINE W REFLEX MICROSCOPIC
Bilirubin Urine: NEGATIVE
Glucose, UA: NEGATIVE mg/dL
Ketones, ur: 5 mg/dL — AB
Leukocytes,Ua: NEGATIVE
Nitrite: NEGATIVE
Protein, ur: 100 mg/dL — AB
RBC / HPF: >50 RBC/hpf (ref 0–5)
Specific Gravity, Urine: 1.023 (ref 1.005–1.030)
Squamous Epithelial / HPF: 0 /HPF (ref 0–5)
pH: 5 (ref 5.0–8.0)

## 2023-07-28 LAB — CBC
HCT: 42.8 % (ref 39.0–52.0)
Hemoglobin: 14.4 g/dL (ref 13.0–17.0)
MCH: 34 pg (ref 26.0–34.0)
MCHC: 33.6 g/dL (ref 30.0–36.0)
MCV: 101.2 fL — ABNORMAL HIGH (ref 80.0–100.0)
Platelets: 237 10*3/uL (ref 150–400)
RBC: 4.23 MIL/uL (ref 4.22–5.81)
RDW: 12.7 % (ref 11.5–15.5)
WBC: 8.3 10*3/uL (ref 4.0–10.5)
nRBC: 0 % (ref 0.0–0.2)

## 2023-07-28 LAB — BASIC METABOLIC PANEL
Anion gap: 10 (ref 5–15)
BUN: 25 mg/dL — ABNORMAL HIGH (ref 8–23)
CO2: 26 mmol/L (ref 22–32)
Calcium: 9.5 mg/dL (ref 8.9–10.3)
Chloride: 97 mmol/L — ABNORMAL LOW (ref 98–111)
Creatinine, Ser: 1.08 mg/dL (ref 0.61–1.24)
GFR, Estimated: >60 mL/min (ref >60)
Glucose, Bld: 115 mg/dL — ABNORMAL HIGH (ref 70–99)
Potassium: 4.1 mmol/L (ref 3.5–5.1)
Sodium: 133 mmol/L — ABNORMAL LOW (ref 135–145)

## 2023-07-28 LAB — TSH: TSH: 1.284 u[IU]/mL (ref 0.350–4.500)

## 2023-07-28 LAB — T4, FREE: Free T4: 0.99 ng/dL (ref 0.61–1.12)

## 2023-07-28 MED ORDER — SULFAMETHOXAZOLE-TRIMETHOPRIM 800-160 MG PO TABS: 1.0000 | ORAL_TABLET | Freq: Two times a day (BID) | ORAL | 0 refills | Status: AC

## 2023-07-28 MED ORDER — SULFAMETHOXAZOLE-TRIMETHOPRIM 800-160 MG PO TABS
1.0000 | ORAL_TABLET | Freq: Once | ORAL | Status: AC
  Administered 2023-07-28: 1 via ORAL
  Filled 2023-07-28: qty 1

## 2023-07-28 NOTE — ED Triage Notes (Signed)
 Pt reports he has been having chills and cold for 3 weeks, pt went to PCP and was informed they thought he had a UTI. Pt reports he finished the antibiotics pt reports doe snot feel better. Pt reports burning sensation with urination, no visible blood in urine. Pt talks in complete sentences no respiratory distress noted

## 2023-07-28 NOTE — ED Notes (Signed)
Pt returns from radiology. 

## 2023-07-28 NOTE — ED Provider Notes (Signed)
 [facility] Provider Note    Event Date/Time   First MD Initiated Contact with Patient 07/28/23 0111     (approximate)   History   Urinary Frequency   HPI  Ryan Allison is a 83 y.o. male   Past medical history of atrial fibrillation on Eliquis, CKD, kidney stones, hypertension hyperlipidemia, colectomy with end ileostomy, here with urinary frequency and chills for the last several weeks.  Was started on antibiotic by his primary doctor for suspected UTI but despite that the symptoms have progressed.  He finished these medications recently.  He has felt fatigued, weak, difficulty sleeping and has lost weight recently.  He denies any abdominal pain or flank pain.  He denies any respiratory infectious symptoms.  He denies any testicular pain or skin changes.  External Medical Documents Reviewed: Appointment notes from earlier this week as outpatient from [facility], viral testing negative and urine testing from [facility] obtained earlier this week with red blood cells in the urine, no growth on urine culture.  Was prescribed cefdinir/azithromycin/prednisone.  Dr. Heywood Footman urology note from 04/20/2023 documenting your lower urinary tract symptoms obstructive voiding symptoms with enlarged prostate on cystoscopy in 2021.     Physical Exam   Triage Vital Signs: ED Triage Vitals  Encounter Vitals Group     BP 07/28/23 0057 (!) 186/80     Systolic BP Percentile --      Diastolic BP Percentile --      Pulse Rate 07/28/23 0057 (!) 110     Resp 07/28/23 0057 16     Temp 07/28/23 0057 98.4 F (36.9 C)     Temp Source 07/28/23 0057 Oral     SpO2 07/28/23 0057 99 %     Weight 07/28/23 0058 155 lb (70.3 kg)     Height 07/28/23 0058 5\' 10"  (1.778 m)     Head Circumference --      Peak Flow --      Pain Score 07/28/23 0058 0     Pain Loc --      Pain Education --      Exclude from Growth Chart --     Most recent vital signs: Vitals:   07/28/23 0137  07/28/23 0228  BP:  (!) 159/77  Pulse: 84 67  Resp:  17  Temp:    SpO2:  100%    General: Awake, no distress.  CV:  Good peripheral perfusion.  Resp:  Normal effort.  Abd:  No distention.  Other:  Hypertensive otherwise vital signs normal.  Pleasant gentleman in no acute distress.  Is wrapped up in warm blankets.  Is a soft benign abdominal exam no distention or masses and cholectomy bag in place with some scant green stools.  There is no CVA tenderness.  His perineal area shows no frank infectious changes.   ED Results / Procedures / Treatments   Labs (all labs ordered are listed, but only abnormal results are displayed) Labs Reviewed  URINALYSIS, ROUTINE W REFLEX MICROSCOPIC - Abnormal; Notable for the following components:      Result Value   Color, Urine YELLOW (*)    APPearance HAZY (*)    Hgb urine dipstick LARGE (*)    Ketones, ur 5 (*)    Protein, ur 100 (*)    Bacteria, UA RARE (*)    All other components within normal limits  BASIC METABOLIC PANEL - Abnormal; Notable for the following components:   Sodium 133 (*)  Chloride 97 (*)    Glucose, Bld 115 (*)    BUN 25 (*)    All other components within normal limits  CBC - Abnormal; Notable for the following components:   MCV 101.2 (*)    All other components within normal limits  LACTIC ACID, PLASMA  LACTIC ACID, PLASMA  T4, FREE  TSH     I ordered and reviewed the above labs they are notable for cell counts and electrolytes unremarkable.    RADIOLOGY I independently reviewed and interpreted CT of the abdomen pelvis and see no obvious obstructive or inflammatory changes I also reviewed radiologist's formal read.   PROCEDURES:  Critical Care performed: No  Procedures   MEDICATIONS ORDERED IN ED: Medications  sulfamethoxazole-trimethoprim (BACTRIM DS) 800-160 MG per tablet 1 tablet (has no administration in time range)    IMPRESSION / MDM / ASSESSMENT AND PLAN / ED COURSE  I reviewed the triage  vital signs and the nursing notes.                                Patient's presentation is most consistent with acute presentation with potential threat to life or bodily function.  Differential diagnosis includes, but is not limited to, urinary tract infection, LUTS due to BPH, pyelonephritis, kidney stone thyroid dysfunction   The patient is on the cardiac monitor to evaluate for evidence of arrhythmia and/or significant heart rate changes.  MDM:    Patient with lower urinary tract symptoms and history of the same with enlarged prostate seen by urology and followed outpatient.  Here with recurrent lower urinary tract symptoms reported as urinary frequency but along with some chills as well.  He reports no other infectious symptoms from other sources such as respiratory symptoms, abdominal pain, skin changes.  May be due to urinary tract infection but he has completed a course of antibiotics without resolution of symptoms.  Will check another urinalysis, check a CT scan for renal stones as he has a history of the same, send urine for culture and can switch his antibiotics (just finished cefdinir/azithromycin) to see if it will help improve symptoms should the urinalysis show any infectious changes.   Otherwise he can follow-up with Dr. Lonna Cobb as an outpatient as I suspect ongoing LUTS with BPH.  He also is feeling cold, weight loss, very fatigued, check thyroid.   Urinalysis shows some bacteria and inflammatory changes, with his chills and urinary symptoms suspect urinary tract infection will give a 7-day course of Bactrim and send for urine culture.  He will follow-up with [facility] as well for his ongoing LUTS/prostate issues as the may be contributing.  Unfortunately lab analysis of thyroid tests has been delayed due to insufficient blood and need for redraw.  If these are not markedly abnormal, plan will be for discharge and PMD/urology follow-up.        FINAL CLINICAL  IMPRESSION(S) / ED DIAGNOSES   Final diagnoses:  Urinary frequency  Chills  Other fatigue     Rx / DC Orders   ED Discharge Orders          Ordered    sulfamethoxazole-trimethoprim (BACTRIM DS) 800-160 MG tablet  2 times daily        07/28/23 0305             Note:  This document was prepared using Dragon voice recognition software and may include unintentional dictation errors.  Pilar Jarvis, MD 07/28/23 [phone]

## 2023-07-28 NOTE — ED Notes (Signed)
 Pt to radiology at this time.

## 2023-07-28 NOTE — Discharge Instructions (Addendum)
 Your testing in the emergency department did not show any life-threatening conditions that accounted for your symptoms.  Take antibiotics for suspected urinary tract infection.  Follow-up with Dr. Lonna Cobb as these symptoms may be due to your prostate issues as well.  In terms of your fatigue and chills, follow-up with your primary doctor for more testing as needed.  Thank you for choosing [name] for your health care today!  Please see your primary doctor this week for a follow up appointment.   If you have any new, worsening, or unexpected symptoms call your doctor right away or come back to the emergency department for reevaluation.  It was my pleasure to care for you today.   Daneil Dan Modesto Charon, MD

## 2023-07-30 ENCOUNTER — Telehealth: Payer: Self-pay | Admitting: Urology

## 2023-07-30 NOTE — Telephone Encounter (Signed)
 [facility] sent referral, but pt already is established w/[facility].  I LMOM asking if he needed to schedule an appt, to call office.

## 2023-08-02 ENCOUNTER — Encounter: Payer: Self-pay | Admitting: Urology

## 2023-08-02 ENCOUNTER — Ambulatory Visit (INDEPENDENT_AMBULATORY_CARE_PROVIDER_SITE_OTHER): Admitting: Urology

## 2023-08-02 VITALS — BP 153/89 | HR 99 | Ht 70.0 in | Wt 155.0 lb

## 2023-08-02 DIAGNOSIS — R310 Gross hematuria: Secondary | ICD-10-CM

## 2023-08-02 DIAGNOSIS — R399 Unspecified symptoms and signs involving the genitourinary system: Secondary | ICD-10-CM | POA: Diagnosis not present

## 2023-08-02 LAB — MICROSCOPIC EXAMINATION: RBC, Urine: >30 /HPF — AB (ref 0–2)

## 2023-08-02 LAB — URINALYSIS, COMPLETE
Bilirubin, UA: NEGATIVE
Glucose, UA: NEGATIVE
Ketones, UA: NEGATIVE
Leukocytes,UA: NEGATIVE
Nitrite, UA: NEGATIVE
Specific Gravity, UA: 1.025 (ref 1.005–1.030)
Urobilinogen, Ur: 0.2 mg/dL (ref 0.2–1.0)
pH, UA: 5 (ref 5.0–7.5)

## 2023-08-02 NOTE — Progress Notes (Signed)
 I, Maysun Anabel Bene, acting as a scribe for Riki Altes, MD., have documented all relevant documentation on the behalf of Riki Altes, MD, as directed by Riki Altes, MD while in the presence of Riki Altes, MD.  08/02/2023 2:52 PM   Ryan Allison April 12, 1941 [id number]  Referring provider: Lynnea Ferrier, MD [address] [facility] [place],  [place] [address]  Chief Complaint  Patient presents with   Hematuria   Urologic history: 1.  Lower urinary tract symptoms Obstructive voiding symptoms Orthostatic changes with tamsulosin; no improvement silodosin Cystoscopy 03/11/2020 with moderate lateral lobe enlargement with prominent hypervascularity Follow-up cystoscopy 11/18/2021 with similar findings   2.  Recurrent gross hematuria Cystoscopy as above CT 01/2019 with right nephrolithiasis   3.  Intermediate risk prostate cancer Followed [facility] oncology Initially treated EBRT + ADT 2011 Nodal mets via PET Axumin 2018 Started bicalutamide monotherapy with PSA 33.6; decreased to 0.3 on bicalutamide however this was stopped secondary to questionable side effects 2021 Currently followed [facility] oncology  HPI: Ryan Allison is a 83 y.o. male presents in follow-up of recent ED visit.   Presented to ED 07/28/23 complaining of urinary frequency, chills, and subjective fever. He had seen his PCP on 07/24/23 complaining of fever, chills, and shortness of breath. Urinalysis showed 182 RBCs and a urine culture was negative. UA in the ED showed >50 RBCs and 11-20 WBCs..  Treated empirically with Septra He had a follow-up visit with PCP on 07/30/23 and yesterday with complaints of persistent chills and urinary frequency. He does feel his frequency has improved. He saw Dr. Graciela Husbands yesterday. CT abdomen pelvis was performed in the ED which showed bilateral non-obstructing renal calculi.  He states he had hematuria yesterday, but today his urine has been grossly  clear.   PMH: Past Medical History:  Diagnosis Date   Amblyopia of right eye    Anxiety    Aortic atherosclerosis (HCC)    Arthritis    Atrial fibrillation and flutter (HCC) 05/2018   a.) CHA2DS2VASc = 4 (age x 2, HTN, vascular disease history); b.) rate/rhythm maintained on oral metoprolol tartrate; chronically anticoagulated with dose reduced apixaban (experienced hematuria on rivaroxaban)   B12 deficiency    Cervical spondylosis with myelopathy    Chronic kidney disease, stage 3 (HCC)    DDD (degenerative disc disease), lumbar    Diverticulosis    Esophageal dysphagia    External hemorrhoids    GERD (gastroesophageal reflux disease)    GI bleed    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   History of echocardiogram    a.) TTE 06/07/2018: EF 60-65%, no RWMAs, mild MR, PASP 33 mmHg; b.) TTE 09/19/2019: EF 50-55%, mild LVH, mod RVE, mild LA dil, mild-mod TR, mild MR, PASP 42.5; c.) TTE 09/14/2022: EF 60-65%, mod basal-septal LVH, mild MR/AR, mod TR, AoV sclerosis, Ao root 37 mm   History of GI diverticular bleed    a.) required prolonged ICU admission at [facility]   History of kidney stones    HLD (hyperlipidemia)    Hyperlipidemia    Hypertension    Kyphoscoliosis    Meniscus tear    On apixaban therapy    OSA (obstructive sleep apnea)    a.) does not require nocturnal PAP therapy   Osteopenia    Peripheral neuropathy    Prostate cancer (HCC) 2011   PTSD (post-traumatic stress disorder)    RBBB (right bundle branch block)  SBO (small bowel obstruction) (HCC)    Skin cancer of scalp     Surgical History: Past Surgical History:  Procedure Laterality Date   COLECTOMY  07/12/2018   with end ileostomy   COLONOSCOPY Left 06/07/2018   Procedure: COLONOSCOPY;  Surgeon: Pasty Spillers, MD;  Location: [facility] ENDOSCOPY;  Service: Endoscopy;  Laterality: Left;   COLONOSCOPY WITH ESOPHAGOGASTRODUODENOSCOPY (EGD)  09/12/2010   COLONOSCOPY WITH PROPOFOL N/A 04/06/2015   Procedure:  COLONOSCOPY WITH PROPOFOL;  Surgeon: Christena Deem, MD;  Location: [facility] ENDOSCOPY;  Service: Endoscopy;  Laterality: N/A;   COLONOSCOPY WITH PROPOFOL N/A 06/07/2018   Procedure: COLONOSCOPY WITH PROPOFOL;  Surgeon: Pasty Spillers, MD;  Location: [facility] ENDOSCOPY;  Service: Endoscopy;  Laterality: N/A;   HAMMER TOE SURGERY Left 01/05/2023   Procedure: HAMMER TOE CORRECTION; INJECTION OF FAT PAD GRAFT;  Surgeon: [name], DPM;  Location: [facility] ORS;  Service: Podiatry;  Laterality: Left;   INGUINAL HERNIA REPAIR Left 09/23/2014   Procedure: HERNIA REPAIR INGUINAL ADULT;  Surgeon: Kieth Brightly, MD;  Location: [facility] ORS;  Service: General;  Laterality: Left;   LASIK  2000   MENISCUS REPAIR Left 05/15/2006   MOHS SURGERY     right scalp   SKIN GRAFT FULL THICKNESS ARM Right 1966   [place]   STRABISMUS SURGERY Right 07/17/2016   VASCULAR SURGERY  05/15/2013   s/p GI Bleed    Home Medications:  Allergies as of 08/02/2023       Reactions   Nsaids Other (See Comments)   Diverticular bleed   Tolectin [tolmetin] Other (See Comments)   Diverticular bleed   Cardizem [diltiazem Hcl] Rash   Celebrex [celecoxib] Itching, Rash   Effexor [venlafaxine] Other (See Comments)   Sedation         Medication List        Accurate as of August 02, 2023  2:52 PM. If you have any questions, ask your nurse or doctor.          acetaminophen 325 MG tablet Commonly known as: TYLENOL Take 1-2 tablets (325-650 mg total) by mouth every 4 (four) hours as needed for mild pain (pain score 1-3).   apixaban 2.5 MG Tabs tablet Commonly known as: ELIQUIS Take 1 tablet (2.5 mg total) by mouth 2 (two) times daily.   gabapentin 600 MG tablet Commonly known as: NEURONTIN Take 1 tablet (600 mg total) by mouth 2 (two) times daily as needed.   metoprolol tartrate 25 MG tablet Commonly known as: LOPRESSOR Take 0.5 tablets (12.5 mg total) by mouth 2 (two) times daily.    multivitamin with minerals Tabs tablet Take 1 tablet by mouth daily.   simvastatin 20 MG tablet Commonly known as: ZOCOR Take 20 mg by mouth at bedtime.   sulfamethoxazole-trimethoprim 800-160 MG tablet Commonly known as: BACTRIM DS Take 1 tablet by mouth 2 (two) times daily for 7 days.   traMADol 50 MG tablet Commonly known as: ULTRAM Take 50 mg by mouth every 6 (six) hours as needed for moderate pain.        Allergies:  Allergies  Allergen Reactions   Nsaids Other (See Comments)    Diverticular bleed   Tolectin [Tolmetin] Other (See Comments)    Diverticular bleed   Cardizem [Diltiazem Hcl] Rash   Celebrex [Celecoxib] Itching and Rash   Effexor [Venlafaxine] Other (See Comments)    Sedation     Family History: Family History  Problem Relation Age of Onset  Dementia Mother    Cancer Father    Cancer Brother     Social History:  reports that he quit smoking about 54 years ago. His smoking use included cigarettes. He has been exposed to tobacco smoke. He has never used smokeless tobacco. He reports that he does not drink alcohol and does not use drugs.   Physical Exam: BP (!) 153/89   Pulse 99   Ht 5\' 10"  (1.778 m)   Wt 155 lb (70.3 kg)   BMI 22.24 kg/m   Constitutional:  Alert and oriented, No acute distress. HEENT: [place] AT Respiratory: Normal respiratory effort, no increased work of breathing. Psychiatric: Normal mood and affect.  Urinalysis 3+ blood/2+ protein, microscopy >30 RBC, no significant WBC.  Pertinent Imaging CT was personally reviewed and interpreted.   CT EXAM: CT ABDOMEN AND PELVIS WITHOUT CONTRAST   TECHNIQUE: Multidetector CT imaging of the abdomen and pelvis was performed following the standard protocol without IV contrast.   RADIATION DOSE REDUCTION: This exam was performed according to the departmental dose-optimization program which includes automated exposure control, adjustment of the mA and/or kV according to patient  size and/or use of iterative reconstruction technique.   COMPARISON:  CT renal 02/06/2019, CT angio chest 02/14/2023, abdomen pelvis 11/29/2021   FINDINGS: Lower chest: Redemonstration of bibasilar peripheral reticulations and cystic changes. Prominent cardiac silhouette partially visualized.   Hepatobiliary: No focal liver abnormality. No gallstones, gallbladder wall thickening, or pericholecystic fluid. No biliary dilatation.   Pancreas: No focal lesion. Normal pancreatic contour. No surrounding inflammatory changes. No main pancreatic ductal dilatation.   Spleen: Normal in size without focal abnormality.   Adrenals/Urinary Tract:   No adrenal nodule bilaterally.   Bilateral nephrolithiasis measuring up to 10 mm on the left and 5 mm on the right. No ureterolithiasis. No hydronephrosis. Fluid density lesion of the left kidney likely represents a simple renal cyst. Simple renal cysts, in the absence of clinically indicated signs/symptoms, require no independent follow-up.   The urinary bladder is grossly unremarkable.   Stomach/Bowel: Right lower quadrant end ileostomy formation. Hartmann pouch formation. Stomach is within normal limits. No evidence of bowel wall thickening or dilatation. Appendix appears normal.   Vascular/Lymphatic: No abdominal aorta or iliac aneurysm. Severe atherosclerotic plaque of the aorta and its branches. No abdominal, pelvic, or inguinal lymphadenopathy.   Reproductive: Prostate is unremarkable. Radiation seeds noted overlying the prostate.   Other: No intraperitoneal free fluid. No intraperitoneal free gas. No organized fluid collection.   Musculoskeletal:   No abdominal wall hernia or abnormality.   No suspicious lytic or blastic osseous lesions. No acute displaced fracture. Multilevel degenerative changes of the spine.   IMPRESSION: 1. Nonobstructive bilateral nephrolithiasis measuring up to 10 mm on the left and 5 mm on the  right. 2. Right lower quadrant end ileostomy formation. 3. Interstitial lung disease/pulmonary fibrosis. 4.  Aortic Atherosclerosis (ICD10-I70.0).     Electronically Signed   By: Tish Frederickson M.D.   On: 07/28/2023 02:50  Assessment & Plan:    83 year old male with several week history of chills, urinary frequency, and gross hematuria. Urine cultures have been negative and do not think this is related to infection. 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  Discussed cystoscopy and possible bladder biopsies, however he wanted to hold off at this point unless his hematuria worsens or continues.  Repeated his urine culture today, and a urine cytology was sent. He will  be notified with the results and further recommendations.  I have reviewed the above documentation for accuracy and completeness, and I agree with the above.   Riki Altes, MD  [facility] [address] [place], [place] [address] [phone]

## 2023-08-02 NOTE — Progress Notes (Unsigned)
 Date:  08/03/2023   ID:  [name], [name] 1940-06-07, MRN [id number]  Patient Location:  [phone] [facility] [address] [place] [address]   Provider location:   Ryan Allison, [place] office  PCP:  Ryan Ferrier, MD  Cardiologist:  Ryan Allison [facility]   Chief Complaint  Patient presents with   6 month follow up     Patient c/o shortness of breath with activity.     History of Present Illness:    Ryan Allison is a 83 y.o. male  past medical history of atrial flutter of uncertain chronicity in 05/2018 not on full dose anticoagulation secondary to prior life threatening GI bleeds,  recurrent prostate cancer , PET scan from 04/2018 showing no metastatic disease,  aortic atherosclerosis,  HLD,  OSA on CPAP,  diverticulosis,  peripheral neuropathy,  prior tobacco abuse,  GERD In the hospital  from 06/06/2018 to 06/08/18 for GI bleed and  atrial flutter Persistent atrial fibrillation January 2020, hematuria  Hx of CVA/hemoragic Followed by urology for hematuria, completed cystoscopy Who presents for permanent atrial flutter, shortness of breath/diastolic CHF, weakness  Seen by myself in clinic 10/24 Presents today with family Reports having recent symptoms of Chronic all-over body pain, weakness/fatigue, chills Was treated by primary care for UTI Presented to the ER 07/28/23: urine frequency , changed ABX to bactrim Starting to feel somewhat better, still feels weak, feels like he is in a recovery process Has not gone back to the gym yet  Trouble breathing at times Passing a little blood in his urine, stable, seems to resolved without intervention "Sometimes it lets lose" Reports he is taking low-dose aspirin with reduced-dose Eliquis  Occasional palpitations on exertion Remains in atrial flutter  EKG personally reviewed by myself on todays visit EKG Interpretation Date/Time:  Friday August 03 2023 11:10:27 EDT Ventricular Rate:  83 PR Interval:    QRS  Duration:  128 QT Interval:  408 QTC Calculation: 479 R Axis:   -50  Text Interpretation: Atrial flutter with variable A-V block Right bundle branch block Left anterior fascicular block Bifascicular block When compared with ECG of 03-Aug-2023 11:05, No significant change was found Confirmed by Ryan Allison [phone]) on 08/03/2023 11:21:48 AM    Was off anticoagulation for foot surgery several days end of September 2024 presented to [facility] ED on 02/14/2023 for evaluation of near syncopal episodes with diaphoresis, dizziness with weakness and dry heaving.  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   Eliquis reversed.  Eliquis held at discharge, was told to follow-up with neurology before restarting Eliquis Admitted to rehab, presented also with a degree of delirium  Discharged from rehab off Eliquis  Echocardiogram May 2024 EF 60 to 65% Mild MR EF improved compared to prior study May 2021  History of hematuria, GI bleed, none recently Previously declined cardioversion  EKGs reviewed Atrial flutter rate 2021 Normal sinus rhythm November 2021 Normal sinus rhythm May 2022 Atrial flutter February 2023 Atrial flutter July 2023 Atrial flutter  August 09, 2022 Atrial flutter 02/05/23 Atrial flutter 03/12/23 Atrial flutter August 03, 2023  Still with ostomy  muscle atrophy,leg weakness, some gait instability  Colect[facility] at [facility] February 2020 Started on Eliquis 5 twice daily, no recurrent bleeding Was only taking metoprolol once a day Previous office visits not interested in cardioversion  Echocardiogram Sep 19, 2019,   ejection fraction 50 to 55% Mildly dilated left atrium Rhythm is atrial  flutter Mildly elevated right heart pressures   [facility] on 06/06/18 following 2 episodes of BRBPR with associated presyncope, SOB, and palpitations.   in atrial flutter with RVR.   transfusion of pRBC secondary to blood loss. He was rate controlled.   not able to  be placed on anticoagulation given recurrent GI bleeding.   colonoscopy on 06/07/2018 that showed possible evidence of diverticular bleed in the sigmoid colon, diverticulosis of the transverse, ascending, and sigmoid colon as well as radiation proctitis. It was recommended he follow up with a colorectal surgeon for possible colectomy due to recurrent diverticular bleed.   Echo during his admission on 06/07/2018 showed an EF of 60-65%, normal wall motion, mild MR, left atrium normal in size, RVSF normal, PASP 33 mmHg, rhythm was atrial flutter.   Seen by PCP on 06/11/2018, EKG showed he remained in atrial flutter  PCP again on 06/17/2018 with a rash that started underneath his axilla, though spread diffusely. He had already self discontinued Cardizem with some improvement in rash. He was placed on metoprolol for rate control of his atrial flutter.  Continues on metoprolol  Prior to GI surgery, had no sx of SOB from his atrial flutter Underwent colectomy at [facility], for diverticuli and GI bleeding, 07/12/18 In follow up had SBO, at [facility] x 1 week Ostomy output has been good.  Drinking Gatorade to stay hydrated  Echo 05/2018 - Left ventricle: The cavity size was normal. Systolic function was   normal. The estimated ejection fraction was in the range of 60%   to 65%. Wall motion was normal; there were no regional wall   motion abnormalities. - Mitral valve: There was mild regurgitation. - Left atrium: The atrium was normal in size. - Right ventricle: Systolic function was normal. - Pulmonary arteries: Systolic pressure was within the normal   range. PA peak pressure: 33 mm Hg (S). - Rhythm is atrial flutter.    Past Medical History:  Diagnosis Date   Amblyopia of right eye    Anxiety    Aortic atherosclerosis (HCC)    Arthritis    Atrial fibrillation and flutter (HCC) 05/2018   a.) CHA2DS2VASc = 4 (age x 2, HTN, vascular disease history); b.) rate/rhythm maintained on oral metoprolol tartrate;  chronically anticoagulated with dose reduced apixaban (experienced hematuria on rivaroxaban)   B12 deficiency    Cervical spondylosis with myelopathy    Chronic kidney disease, stage 3 (HCC)    DDD (degenerative disc disease), lumbar    Diverticulosis    Esophageal dysphagia    External hemorrhoids    GERD (gastroesophageal reflux disease)    GI bleed    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   History of echocardiogram    a.) TTE 06/07/2018: EF 60-65%, no RWMAs, mild MR, PASP 33 mmHg; b.) TTE 09/19/2019: EF 50-55%, mild LVH, mod RVE, mild LA dil, mild-mod TR, mild MR, PASP 42.5; c.) TTE 09/14/2022: EF 60-65%, mod basal-septal LVH, mild MR/AR, mod TR, AoV sclerosis, Ao root 37 mm   History of GI diverticular bleed    a.) required prolonged ICU admission at [facility]   History of kidney stones    HLD (hyperlipidemia)    Hyperlipidemia    Hypertension    Kyphoscoliosis    Meniscus tear    On apixaban therapy    OSA (obstructive sleep apnea)    a.) does not require nocturnal PAP therapy   Osteopenia    Peripheral neuropathy    Prostate cancer (HCC) 2011  PTSD (post-traumatic stress disorder)    RBBB (right bundle branch block)    SBO (small bowel obstruction) (HCC)    Skin cancer of scalp    Past Surgical History:  Procedure Laterality Date   COLECTOMY  07/12/2018   with end ileostomy   COLONOSCOPY Left 06/07/2018   Procedure: COLONOSCOPY;  Surgeon: Pasty Spillers, MD;  Location: [facility] ENDOSCOPY;  Service: Endoscopy;  Laterality: Left;   COLONOSCOPY WITH ESOPHAGOGASTRODUODENOSCOPY (EGD)  09/12/2010   COLONOSCOPY WITH PROPOFOL N/A 04/06/2015   Procedure: COLONOSCOPY WITH PROPOFOL;  Surgeon: Christena Deem, MD;  Location: [facility] ENDOSCOPY;  Service: Endoscopy;  Laterality: N/A;   COLONOSCOPY WITH PROPOFOL N/A 06/07/2018   Procedure: COLONOSCOPY WITH PROPOFOL;  Surgeon: Pasty Spillers, MD;  Location: [facility] ENDOSCOPY;  Service: Endoscopy;  Laterality: N/A;   HAMMER TOE SURGERY Left  01/05/2023   Procedure: HAMMER TOE CORRECTION; INJECTION OF FAT PAD GRAFT;  Surgeon: [name], DPM;  Location: [facility] ORS;  Service: Podiatry;  Laterality: Left;   INGUINAL HERNIA REPAIR Left 09/23/2014   Procedure: HERNIA REPAIR INGUINAL ADULT;  Surgeon: Kieth Brightly, MD;  Location: [facility] ORS;  Service: General;  Laterality: Left;   LASIK  2000   MENISCUS REPAIR Left 05/15/2006   MOHS SURGERY     right scalp   SKIN GRAFT FULL THICKNESS ARM Right 1966   [place] mine   STRABISMUS SURGERY Right 07/17/2016   VASCULAR SURGERY  05/15/2013   s/p GI Bleed     Current Meds  Medication Sig   acetaminophen (TYLENOL) 325 MG tablet Take 1-2 tablets (325-650 mg total) by mouth every 4 (four) hours as needed for mild pain (pain score 1-3).   apixaban (ELIQUIS) 2.5 MG TABS tablet Take 1 tablet (2.5 mg total) by mouth 2 (two) times daily.   gabapentin (NEURONTIN) 600 MG tablet Take 1 tablet (600 mg total) by mouth 2 (two) times daily as needed.   metoprolol tartrate (LOPRESSOR) 25 MG tablet Take 0.5 tablets (12.5 mg total) by mouth 2 (two) times daily.   Multiple Vitamin (MULTIVITAMIN WITH MINERALS) TABS tablet Take 1 tablet by mouth daily.   simvastatin (ZOCOR) 20 MG tablet Take 20 mg by mouth at bedtime.    sulfamethoxazole-trimethoprim (BACTRIM DS) 800-160 MG tablet Take 1 tablet by mouth 2 (two) times daily for 7 days.   traMADol (ULTRAM) 50 MG tablet Take 50 mg by mouth every 6 (six) hours as needed for moderate pain.      Allergies:   Nsaids, Tolectin [tolmetin], Cardizem [diltiazem hcl], Celebrex [celecoxib], and Effexor [venlafaxine]   Social History   Tobacco Use   Smoking status: Former    Current packs/day: 0.00    Types: Cigarettes    Quit date: 05/15/1969    Years since quitting: 54.2    Passive exposure: Past   Smokeless tobacco: Never  Vaping Use   Vaping status: Never Used  Substance Use Topics   Alcohol use: No   Drug use: No     Family Hx: The  patient's family history includes Cancer in his brother and father; Dementia in his mother.  ROS:   Please see the history of present illness.    Review of Systems  Constitutional:  Positive for malaise/fatigue.  Respiratory:  Positive for shortness of breath.   Cardiovascular: Negative.   Gastrointestinal: Negative.   Musculoskeletal: Negative.   Neurological: Negative.   Psychiatric/Behavioral: Negative.    All other systems reviewed and are negative.  Labs/Other Tests and  Data Reviewed:    Recent Labs: 02/20/2023: Magnesium 2.1 02/24/2023: ALT 28 07/28/2023: BUN 25; Creatinine, Ser 1.08; Hemoglobin 14.4; Platelets 237; Potassium 4.1; Sodium 133; TSH 1.284   Recent Lipid Panel Lab Results  Component Value Date/Time   TRIG 63 02/16/2023 04:48 AM    Wt Readings from Last 3 Encounters:  08/03/23 156 lb (70.8 kg)  08/02/23 155 lb (70.3 kg)  07/28/23 155 lb (70.3 kg)     Exam:    BP (!) 140/70 (BP Location: Left Arm, Patient Position: Sitting, Cuff Size: Normal)   Pulse 83   Ht 5\' 10"  (1.778 m)   Wt 156 lb (70.8 kg)   SpO2 95%   BMI 22.38 kg/m  Constitutional:  oriented to person, place, and time. No distress.  HENT:  Head: Grossly normal Eyes:  no discharge. No scleral icterus.  Neck: No JVD, no carotid bruits  Cardiovascular: Irregularly irregular, no murmurs appreciated Pulmonary/Chest: Clear to auscultation bilaterally, no wheezes or rails Abdominal: Soft.  no distension.  no tenderness.  Musculoskeletal: Normal range of motion Neurological:  normal muscle tone. Coordination normal. No atrophy Skin: Skin warm and dry Psychiatric: normal affect, pleasant  ASSESSMENT & PLAN:    Atrial flutter, typical Noted February 2023, has persisted since that time, previously declined cardioversion Hematuria and GI bleed on full dose anticoagulation Tolerating Eliquis 2.5 twice daily Recommend he increase metoprolol tartrate up to 25 twice daily  Chronic diastolic  CHF Appears euvolemic, EF normal Has ostomy For worsening shortness of breath would consider Lasix as needed  Aortic atherosclerosis (HCC) Cholesterol at goal  OSA on CPAP Managed by primary care  Weight loss/weakness Completed rehab for  following stroke Feels weak after recent urinary tract infection, still recovering  Adjustment disorder/depression Difficulty adjusting after recent stroke and after UTI Consider restarting exercise program as strength comes back  Signed, Ryan Nordmann, MD  08/03/2023 11:21 AM    [facility] [address], [place], [place] [address]

## 2023-08-03 ENCOUNTER — Encounter: Payer: Self-pay | Admitting: Cardiovascular Disease

## 2023-08-03 ENCOUNTER — Ambulatory Visit: Payer: Medicare Other | Attending: Cardiovascular Disease | Admitting: Cardiovascular Disease

## 2023-08-03 VITALS — BP 140/70 | HR 83 | Ht 70.0 in | Wt 156.0 lb

## 2023-08-03 DIAGNOSIS — I10 Essential (primary) hypertension: Secondary | ICD-10-CM

## 2023-08-03 DIAGNOSIS — I700 Atherosclerosis of aorta: Secondary | ICD-10-CM | POA: Diagnosis present

## 2023-08-03 DIAGNOSIS — E782 Mixed hyperlipidemia: Secondary | ICD-10-CM

## 2023-08-03 DIAGNOSIS — I480 Paroxysmal atrial fibrillation: Secondary | ICD-10-CM | POA: Diagnosis present

## 2023-08-03 DIAGNOSIS — I4892 Unspecified atrial flutter: Secondary | ICD-10-CM | POA: Diagnosis not present

## 2023-08-03 DIAGNOSIS — G4733 Obstructive sleep apnea (adult) (pediatric): Secondary | ICD-10-CM | POA: Diagnosis present

## 2023-08-03 MED ORDER — METOPROLOL TARTRATE 25 MG PO TABS: 25.0000 mg | ORAL_TABLET | Freq: Two times a day (BID) | ORAL | 3 refills | Status: AC

## 2023-08-03 NOTE — Patient Instructions (Addendum)
 Medication Instructions:  Please increase the metoprolol up to 25 mg twice a day  If you need a refill on your cardiac medications before your next appointment, please call your pharmacy.   Lab work: No new labs needed  Testing/Procedures: No new testing needed  Follow-Up: At [facility], you and your health needs are our priority.  As part of our continuing mission to provide you with exceptional heart care, we have created designated Provider Care Teams.  These Care Teams include your primary Cardiologist (physician) and Advanced Practice Providers (APPs -  Physician Assistants and Nurse Practitioners) who all work together to provide you with the care you need, when you need it.  You will need a follow up appointment in 6 months, APP ok  Providers on your designated Care Team:   Nicolasa Ducking, NP Eula Listen, PA-C Cadence Fransico Michael, [place]  COVID-19 Vaccine Information can be found at: [email or web address] For questions related to vaccine distribution or appointments, please email [email or web address] or call [phone].

## 2023-08-08 ENCOUNTER — Encounter: Payer: Self-pay | Admitting: *Deleted

## 2023-08-08 LAB — CULTURE, URINE COMPREHENSIVE

## 2023-08-08 NOTE — Telephone Encounter (Signed)
Pt returned call and I read message from [place] 

## 2023-08-12 ENCOUNTER — Telehealth: Payer: Self-pay | Admitting: Urology

## 2023-08-12 NOTE — Telephone Encounter (Signed)
 Urine cytology did show abnormal cells.  The possibility of bladder cancer cannot be excluded without bladder biopsies.  The recommend scheduling cystoscopy with bladder biopsies in the same-day surgery.  If he desires to schedule let me know and I will send orders to [facility].  If he wants to discuss further can schedule a follow-up office visit.

## 2023-08-13 NOTE — Telephone Encounter (Signed)
 Notified patient as instructed, patient states he will think about it  and call us back .

## 2023-09-21 ENCOUNTER — Ambulatory Visit (INDEPENDENT_AMBULATORY_CARE_PROVIDER_SITE_OTHER): Admitting: Urology

## 2023-09-21 ENCOUNTER — Encounter: Payer: Self-pay | Admitting: Urology

## 2023-09-21 VITALS — BP 116/73 | HR 91 | Ht 70.0 in | Wt 164.0 lb

## 2023-09-21 DIAGNOSIS — R8289 Other abnormal findings on cytological and histological examination of urine: Secondary | ICD-10-CM | POA: Diagnosis not present

## 2023-09-21 DIAGNOSIS — N029 Recurrent and persistent hematuria with unspecified morphologic changes: Secondary | ICD-10-CM

## 2023-09-21 NOTE — Progress Notes (Signed)
 I, Maysun Jamey Mccallum, acting as a scribe for Geraline Knapp, MD., have documented all relevant documentation on the behalf of Geraline Knapp, MD, as directed by Geraline Knapp, MD while in the presence of Geraline Knapp, MD.  09/21/2023 3:42 PM   Ryan Allison March 10, 1941 [id number]  Referring provider: Melchor Spoon, MD [address] [facility] [place],  [place] [address]  Chief Complaint  Patient presents with   Hematuria   Urologic history: 1.  Lower urinary tract symptoms -Obstructive voiding symptoms -Orthostatic changes with tamsulosin ; no improvement silodosin  -Cystoscopy 03/11/2020 with moderate lateral lobe enlargement with prominent hypervascularity -Follow-up cystoscopy 11/18/2021 with similar findings   2.  Recurrent gross hematuria -Cystoscopy as above -CT 01/2019 with right nephrolithiasis   3.  Intermediate risk prostate cancer -Followed [facility] oncology -Initially treated EBRT + ADT 2011 -Nodal mets via PET Axumin 2018 -Started bicalutamide  monotherapy with PSA 33.6; decreased to 0.3 on bicalutamide  however this was stopped secondary to questionable side effects 2021 -Currently followed [facility] oncology  HPI: Ryan Allison is a 83 y.o. male presents for follow-up visit.   At his last visit, urine cytology was sent, which was read as dysplastic cells.  He was contacted regarding scheduling cystoscopy with bladder biopsies, however he wanted to come into the office and discuss this further. He has had some increasing hematuria, which has been intermittent. He wanted to know if this procedure involved exploratory surgery.    PMH: Past Medical History:  Diagnosis Date   Amblyopia of right eye    Anxiety    Aortic atherosclerosis (HCC)    Arthritis    Atrial fibrillation and flutter (HCC) 05/2018   a.) CHA2DS2VASc = 4 (age x 2, HTN, vascular disease history); b.) rate/rhythm maintained on oral metoprolol  tartrate; chronically anticoagulated  with dose reduced apixaban  (experienced hematuria on rivaroxaban )   B12 deficiency    Cervical spondylosis with myelopathy    Chronic kidney disease, stage 3 (HCC)    DDD (degenerative disc disease), lumbar    Diverticulosis    Esophageal dysphagia    External hemorrhoids    GERD (gastroesophageal reflux disease)    GI bleed    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   History of echocardiogram    a.) TTE 06/07/2018: EF 60-65%, no RWMAs, mild MR, PASP 33 mmHg; b.) TTE 09/19/2019: EF 50-55%, mild LVH, mod RVE, mild LA dil, mild-mod TR, mild MR, PASP 42.5; c.) TTE 09/14/2022: EF 60-65%, mod basal-septal LVH, mild MR/AR, mod TR, AoV sclerosis, Ao root 37 mm   History of GI diverticular bleed    a.) required prolonged ICU admission at [facility]   History of kidney stones    HLD (hyperlipidemia)    Hyperlipidemia    Hypertension    Kyphoscoliosis    Meniscus tear    On apixaban  therapy    OSA (obstructive sleep apnea)    a.) does not require nocturnal PAP therapy   Osteopenia    Peripheral neuropathy    Prostate cancer (HCC) 2011   PTSD (post-traumatic stress disorder)    RBBB (right bundle branch block)    SBO (small bowel obstruction) (HCC)    Skin cancer of scalp     Surgical History: Past Surgical History:  Procedure Laterality Date   COLECTOMY  07/12/2018   with end ileostomy   COLONOSCOPY Left 06/07/2018   Procedure: COLONOSCOPY;  Surgeon: Irby Mannan, MD;  Location: [facility] ENDOSCOPY;  Service: Endoscopy;  Laterality: Left;   COLONOSCOPY WITH ESOPHAGOGASTRODUODENOSCOPY (EGD)  09/12/2010   COLONOSCOPY WITH PROPOFOL  N/A 04/06/2015   Procedure: COLONOSCOPY WITH PROPOFOL ;  Surgeon: Deveron Fly, MD;  Location: [facility] ENDOSCOPY;  Service: Endoscopy;  Laterality: N/A;   COLONOSCOPY WITH PROPOFOL  N/A 06/07/2018   Procedure: COLONOSCOPY WITH PROPOFOL ;  Surgeon: Irby Mannan, MD;  Location: [facility] ENDOSCOPY;  Service: Endoscopy;  Laterality: N/A;   HAMMER TOE SURGERY Left  01/05/2023   Procedure: HAMMER TOE CORRECTION; INJECTION OF FAT PAD GRAFT;  Surgeon: Floyce Hutching, DPM;  Location: [facility] ORS;  Service: Podiatry;  Laterality: Left;   INGUINAL HERNIA REPAIR Left 09/23/2014   Procedure: HERNIA REPAIR INGUINAL ADULT;  Surgeon: Jerlean Mood, MD;  Location: [facility] ORS;  Service: General;  Laterality: Left;   LASIK  2000   MENISCUS REPAIR Left 05/15/2006   MOHS SURGERY     right scalp   SKIN GRAFT FULL THICKNESS ARM Right 1966   [place] mine   STRABISMUS SURGERY Right 07/17/2016   VASCULAR SURGERY  05/15/2013   s/p GI Bleed    Home Medications:  Allergies as of 09/21/2023       Reactions   Mirtazapine Other (See Comments)   confusion   Nsaids Other (See Comments)   Diverticular bleed   Tolectin [tolmetin] Other (See Comments)   Diverticular bleed   Cardizem  [diltiazem  Hcl] Rash   Celebrex [celecoxib] Itching, Rash   Effexor [venlafaxine] Other (See Comments)   Sedation         Medication List        Accurate as of Sep 21, 2023  3:42 PM. If you have any questions, ask your nurse or doctor.          acetaminophen  325 MG tablet Commonly known as: TYLENOL  Take 1-2 tablets (325-650 mg total) by mouth every 4 (four) hours as needed for mild pain (pain score 1-3).   apixaban  2.5 MG Tabs tablet Commonly known as: ELIQUIS  Take 1 tablet (2.5 mg total) by mouth 2 (two) times daily.   gabapentin  600 MG tablet Commonly known as: NEURONTIN  Take 1 tablet (600 mg total) by mouth 2 (two) times daily as needed.   metoprolol  tartrate 25 MG tablet Commonly known as: LOPRESSOR  Take 1 tablet (25 mg total) by mouth 2 (two) times daily.   multivitamin with minerals Tabs tablet Take 1 tablet by mouth daily.   simvastatin  20 MG tablet Commonly known as: ZOCOR  Take 20 mg by mouth at bedtime.   traMADol  50 MG tablet Commonly known as: ULTRAM  Take 50 mg by mouth every 6 (six) hours as needed for moderate pain.        Allergies:   Allergies  Allergen Reactions   Mirtazapine Other (See Comments)    confusion   Nsaids Other (See Comments)    Diverticular bleed   Tolectin [Tolmetin] Other (See Comments)    Diverticular bleed   Cardizem  [Diltiazem  Hcl] Rash   Celebrex [Celecoxib] Itching and Rash   Effexor [Venlafaxine] Other (See Comments)    Sedation     Family History: Family History  Problem Relation Age of Onset   Dementia Mother    Cancer Father    Cancer Brother     Social History:  reports that he quit smoking about 54 years ago. His smoking use included cigarettes. He has been exposed to tobacco smoke. He has never used smokeless tobacco. He reports that he does not drink alcohol and does not use drugs.   Physical  Exam: BP 116/73   Pulse 91   Ht 5\' 10"  (1.778 m)   Wt 164 lb (74.4 kg)   BMI 23.53 kg/m   Constitutional:  Alert and oriented, No acute distress. HEENT: [place] AT, moist mucus membranes.  Trachea midline, no masses. Cardiovascular: No clubbing, cyanosis, or edema. Respiratory: Normal respiratory effort, no increased work of breathing. GI: Abdomen is soft, nontender, nondistended, no abdominal masses Skin: No rashes, bruises or suspicious lesions. Neurologic: Grossly intact, no focal deficits, moving all 4 extremities. Psychiatric: Normal mood and affect.   Assessment & Plan:    1. Recurrent gross hematuria We discussed his cytology was read as dysplastic cells and based on this finding, the possibility of malignancy cannot be excluded.  We discussed the procedure, how it would involve cystoscopy under anesthesia/sedation, and biopsies of any abnormal areas would be performed.  Any bleeding sites would also be cauterized.  We discussed this would not involve surgery with an incision.  He would like to think this over. If he elects not to have the procedure done, I would recommend following periodic urine cytologies.  I asked him when his next oncology visit was and he states he is  scheduled for a phone/video visit very soon, and he ask his oncologist about further evaluation.  [facility] [address] [place], [place] [address] [phone]

## 2023-09-22 ENCOUNTER — Emergency Department
Admission: EM | Admit: 2023-09-22 | Discharge: 2023-09-22 | Disposition: A | Discharge status: Home / Self Care | Attending: Emergency Medicine | Admitting: Emergency Medicine

## 2023-09-22 ENCOUNTER — Encounter: Payer: Self-pay | Admitting: Urology

## 2023-09-22 ENCOUNTER — Other Ambulatory Visit: Payer: Self-pay

## 2023-09-22 ENCOUNTER — Emergency Department

## 2023-09-22 ENCOUNTER — Encounter: Payer: Self-pay | Admitting: Intensive Care

## 2023-09-22 DIAGNOSIS — R509 Fever, unspecified: Secondary | ICD-10-CM | POA: Diagnosis not present

## 2023-09-22 DIAGNOSIS — R319 Hematuria, unspecified: Secondary | ICD-10-CM | POA: Insufficient documentation

## 2023-09-22 DIAGNOSIS — R531 Weakness: Secondary | ICD-10-CM | POA: Insufficient documentation

## 2023-09-22 DIAGNOSIS — Z8546 Personal history of malignant neoplasm of prostate: Secondary | ICD-10-CM | POA: Insufficient documentation

## 2023-09-22 LAB — URINALYSIS, ROUTINE W REFLEX MICROSCOPIC
Bacteria, UA: NONE SEEN
Bilirubin Urine: NEGATIVE
Glucose, UA: NEGATIVE mg/dL
Ketones, ur: 5 mg/dL — AB
Leukocytes,Ua: NEGATIVE
Nitrite: NEGATIVE
Protein, ur: 100 mg/dL — AB
RBC / HPF: >50 RBC/hpf (ref 0–5)
Specific Gravity, Urine: 1.021 (ref 1.005–1.030)
Squamous Epithelial / HPF: 0 /HPF (ref 0–5)
pH: 5 (ref 5.0–8.0)

## 2023-09-22 LAB — COMPREHENSIVE METABOLIC PANEL WITH GFR
ALT: 16 U/L (ref 0–44)
AST: 28 U/L (ref 15–41)
Albumin: 4.1 g/dL (ref 3.5–5.0)
Alkaline Phosphatase: 85 U/L (ref 38–126)
Anion gap: 10 (ref 5–15)
BUN: 35 mg/dL — ABNORMAL HIGH (ref 8–23)
CO2: 28 mmol/L (ref 22–32)
Calcium: 9.3 mg/dL (ref 8.9–10.3)
Chloride: 97 mmol/L — ABNORMAL LOW (ref 98–111)
Creatinine, Ser: 1.42 mg/dL — ABNORMAL HIGH (ref 0.61–1.24)
GFR, Estimated: 50 mL/min — ABNORMAL LOW (ref >60)
Glucose, Bld: 102 mg/dL — ABNORMAL HIGH (ref 70–99)
Potassium: 4.5 mmol/L (ref 3.5–5.1)
Sodium: 135 mmol/L (ref 135–145)
Total Bilirubin: 1.2 mg/dL (ref 0.0–1.2)
Total Protein: 8.1 g/dL (ref 6.5–8.1)

## 2023-09-22 LAB — CBC
HCT: 42.1 % (ref 39.0–52.0)
Hemoglobin: 14.2 g/dL (ref 13.0–17.0)
MCH: 33.6 pg (ref 26.0–34.0)
MCHC: 33.7 g/dL (ref 30.0–36.0)
MCV: 99.8 fL (ref 80.0–100.0)
Platelets: 176 10*3/uL (ref 150–400)
RBC: 4.22 MIL/uL (ref 4.22–5.81)
RDW: 11.9 % (ref 11.5–15.5)
WBC: 7.5 10*3/uL (ref 4.0–10.5)
nRBC: 0 % (ref 0.0–0.2)

## 2023-09-22 LAB — RESP PANEL BY RT-PCR (RSV, FLU A&B, COVID)  RVPGX2
Influenza A by PCR: NEGATIVE
Influenza B by PCR: NEGATIVE
Resp Syncytial Virus by PCR: NEGATIVE
SARS Coronavirus 2 by RT PCR: NEGATIVE

## 2023-09-22 LAB — MAGNESIUM: Magnesium: 1.8 mg/dL (ref 1.7–2.4)

## 2023-09-22 MED ORDER — SODIUM CHLORIDE 0.9 % IV BOLUS
1000.0000 mL | Freq: Once | INTRAVENOUS | Status: AC
  Administered 2023-09-22: 1000 mL via INTRAVENOUS

## 2023-09-22 NOTE — ED Triage Notes (Signed)
 Patient c/o weakness and fever. Reports started feeling bad a couple days ago  Patient reports seeing urologist yesterday due to bleeding from his penis and they are going to schedule a test to see what the bleeding is coming from. Denies trouble urinating   Ostomy bag present. Reports he has had it 5 years

## 2023-09-22 NOTE — ED Provider Notes (Signed)
 [facility] Provider Note    Event Date/Time   First MD Initiated Contact with Patient 09/22/23 1736     (approximate)   History   Weakness   HPI  TEAK BAKA is a 83 year old male with history of atrial flutter, GI bleed, prostate cancer presenting to the ER for evaluation of generalized weakness.  Over the past few days patient reports he has felt generally weak.  Does report associated subjective fevers. Denies cough, congestion, chest pain, shortness of breath, abdominal pain, vomiting, diarrhea, dysuria, rash.  Does report ongoing hematuria, seeing urology for the same.     Physical Exam   Triage Vital Signs: ED Triage Vitals  Encounter Vitals Group     BP 09/22/23 1725 (!) 152/79     Systolic BP Percentile --      Diastolic BP Percentile --      Pulse Rate 09/22/23 1725 88     Resp 09/22/23 1725 16     Temp 09/22/23 1725 98.1 F (36.7 C)     Temp Source 09/22/23 1725 Oral     SpO2 09/22/23 1725 100 %     Weight 09/22/23 1726 164 lb (74.4 kg)     Height 09/22/23 1726 5\' 10"  (1.778 m)     Head Circumference --      Peak Flow --      Pain Score 09/22/23 1725 10     Pain Loc --      Pain Education --      Exclude from Growth Chart --     Most recent vital signs: Vitals:   09/22/23 1830 09/22/23 1900  BP: (!) 151/73 (!) 154/62  Pulse: 82 81  Resp: (!) 25 (!) 27  Temp:    SpO2: 100% 100%     General: Awake, interactive  CV:  Regular rate, good peripheral perfusion.  Resp:  Unlabored respirations, lungs clear auscultation Abd:  Nondistended, soft, nontender Neuro:  Symmetric facial movement, fluid speech, 5 out of 5 strength in the bilateral upper and lower extremities   ED Results / Procedures / Treatments   Labs (all labs ordered are listed, but only abnormal results are displayed) Labs Reviewed  COMPREHENSIVE METABOLIC PANEL WITH GFR - Abnormal; Notable for the following components:      Result Value   Chloride 97 (*)     Glucose, Bld 102 (*)    BUN 35 (*)    Creatinine, Ser 1.42 (*)    GFR, Estimated 50 (*)    All other components within normal limits  URINALYSIS, ROUTINE W REFLEX MICROSCOPIC - Abnormal; Notable for the following components:   Color, Urine YELLOW (*)    APPearance CLOUDY (*)    Hgb urine dipstick LARGE (*)    Ketones, ur 5 (*)    Protein, ur 100 (*)    All other components within normal limits  RESP PANEL BY RT-PCR (RSV, FLU A&B, COVID)  RVPGX2  CBC  MAGNESIUM      EKG EKG independently reviewed interpreted by myself (ER attending) demonstrates:  EKG demonstrates atrial flutter at a rate of 100, QRS 104, QTc 536, flutter waves noted in multiple leads, interpreted by computer as STEMI, but suspect this is due to inaccurate interpretation of primary atrial flutter rhythm               RADIOLOGY Imaging independently reviewed and interpreted by myself demonstrates:  CXR without focal consolidation on my review, formal radiology read pending  Formal  Radiology Read:  No results found.  PROCEDURES:  Critical Care performed: No  Procedures   MEDICATIONS ORDERED IN ED: Medications  sodium chloride  0.9 % bolus 1,000 mL (1,000 mLs Intravenous New Bag/Given 09/22/23 1850)     IMPRESSION / MDM / ASSESSMENT AND PLAN / ED COURSE  I reviewed the triage vital signs and the nursing notes.  Differential diagnosis includes, but is not limited to, pneumonia, UTI, viral illness, anemia, electrolyte abnormality  Patient's presentation is most consistent with acute presentation with potential threat to life or bodily function.  83 year old male presenting with generalized weakness.  Stable vitals on presentation.  Labs with normal white blood cell count and hemoglobin.  Creatinine slightly up from baseline at 1.42, suspect component of dehydration.  Ordered for a liter of IV fluids.  Urine with presence of blood, but overall not suggestive of infection.  Viral swab negative.  Patient  reassessed.  Reports continued generalized weakness, but denies any new complaints.  He is eager to be discharged home and does feel that he can safely be at home.  I did discuss empiric antibiotics given his subjective fevers but he prefers to hold off which I do think is reasonable as I do not see a clear indication on exam or with his workup.  Delay in radiology read, we will plan to contact patient if x-Timm Bonenberger radiology read with concerning findings.  Strict return precautions provided.  Patient discharged in stable condition.  After discharge, do note that patient's x-Bonny Vanleeuwen was read without acute findings.      FINAL CLINICAL IMPRESSION(S) / ED DIAGNOSES   Final diagnoses:  Generalized weakness     Rx / DC Orders   ED Discharge Orders     None        Note:  This document was prepared using Dragon voice recognition software and may include unintentional dictation errors.   Claria Crofts, MD 09/22/23 [phone]

## 2023-09-22 NOTE — ED Notes (Signed)
 EDP at bedside

## 2023-09-22 NOTE — Discharge Instructions (Signed)
 You are seen in the ER for your generalized weakness.  We fortunately did not find an emergency finding for this.  Please make sure you are eating regularly and staying hydrated.  Follow with your primary care doctor for further evaluation.  Return to the ER for new or worsening symptoms.

## 2023-09-24 ENCOUNTER — Telehealth: Payer: Self-pay

## 2023-09-24 NOTE — Telephone Encounter (Signed)
 Pt calls triage line and states he was told he needed to have surgery he is questioning the timeline for when this will be performed and how urgent it is. Please advise.

## 2023-09-25 DIAGNOSIS — C778 Secondary and unspecified malignant neoplasm of lymph nodes of multiple regions: Principal | ICD-10-CM

## 2023-09-25 NOTE — Telephone Encounter (Signed)
 It is not urgent, I would recommend getting scheduled sometime within the next 8 weeks.  I did send a message to Dr. Cloyd Dare at [facility] so he is also aware.

## 2023-09-25 NOTE — Telephone Encounter (Signed)
 Left message on voice mail also

## 2023-10-02 ENCOUNTER — Encounter: Admit: 2023-10-02 | Discharge: 2023-10-03 | Attending: Hematology & Oncology | Primary: Hematology & Oncology

## 2023-10-02 DIAGNOSIS — C778 Secondary and unspecified malignant neoplasm of lymph nodes of multiple regions: Principal | ICD-10-CM

## 2023-10-02 DIAGNOSIS — C61 Malignant neoplasm of prostate: Principal | ICD-10-CM
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
## 2023-10-31 ENCOUNTER — Ambulatory Visit: Admitting: Urology

## 2023-11-28 ENCOUNTER — Ambulatory Visit (INDEPENDENT_AMBULATORY_CARE_PROVIDER_SITE_OTHER): Admitting: Urology

## 2023-11-28 ENCOUNTER — Encounter: Payer: Self-pay | Admitting: Urology

## 2023-11-28 VITALS — BP 129/74 | HR 80 | Ht 70.0 in | Wt 167.0 lb

## 2023-11-28 DIAGNOSIS — N029 Recurrent and persistent hematuria with unspecified morphologic changes: Secondary | ICD-10-CM | POA: Diagnosis not present

## 2023-11-28 DIAGNOSIS — R8289 Other abnormal findings on cytological and histological examination of urine: Secondary | ICD-10-CM | POA: Diagnosis not present

## 2023-11-28 NOTE — Progress Notes (Unsigned)
 11/28/2023 10:39 AM   Ryan Allison Mar 15, 1941 [id number]  Referring provider: Fernande Ophelia JINNY DOUGLAS, MD [address] [facility] [place],  [place] [address]  Chief Complaint  Patient presents with   Other   Urologic history: 1.  Lower urinary tract symptoms -Obstructive voiding symptoms -Orthostatic changes with tamsulosin ; no improvement silodosin  -Cystoscopy 03/11/2020 with moderate lateral lobe enlargement with prominent hypervascularity -Follow-up cystoscopy 11/18/2021 with similar findings   2.  Recurrent gross hematuria -Cystoscopy as above -CT 01/2019 with right nephrolithiasis   3.  Intermediate risk prostate cancer -Followed [facility] oncology -Initially treated EBRT + ADT 2011 -Nodal mets via PET Axumin 2018 -Started bicalutamide  monotherapy with PSA 33.6; decreased to 0.3 on bicalutamide  however this was stopped secondary to questionable side effects 2021 -Currently followed [facility] oncology  HPI: Ryan Allison is a 83 y.o. male presents for follow-up visit.   Urine cytology March 2025 interpreted as dysplastic cells We had discussed cystoscopy with bladder biopsy at a follow-up visit in May 2025 and he wanted to hold off No recurrent gross hematuria since last visit   PMH: Past Medical History:  Diagnosis Date   Amblyopia of right eye    Anxiety    Aortic atherosclerosis (HCC)    Arthritis    Atrial fibrillation and flutter (HCC) 05/2018   a.) CHA2DS2VASc = 4 (age x 2, HTN, vascular disease history); b.) rate/rhythm maintained on oral metoprolol  tartrate; chronically anticoagulated with dose reduced apixaban  (experienced hematuria on rivaroxaban )   B12 deficiency    Cervical spondylosis with myelopathy    Chronic kidney disease, stage 3 (HCC)    DDD (degenerative disc disease), lumbar    Diverticulosis    Esophageal dysphagia    External hemorrhoids    GERD (gastroesophageal reflux disease)    GI bleed    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    History of echocardiogram    a.) TTE 06/07/2018: EF 60-65%, no RWMAs, mild MR, PASP 33 mmHg; b.) TTE 09/19/2019: EF 50-55%, mild LVH, mod RVE, mild LA dil, mild-mod TR, mild MR, PASP 42.5; c.) TTE 09/14/2022: EF 60-65%, mod basal-septal LVH, mild MR/AR, mod TR, AoV sclerosis, Ao root 37 mm   History of GI diverticular bleed    a.) required prolonged ICU admission at [facility]   History of kidney stones    HLD (hyperlipidemia)    Hyperlipidemia    Hypertension    Kyphoscoliosis    Meniscus tear    On apixaban  therapy    OSA (obstructive sleep apnea)    a.) does not require nocturnal PAP therapy   Osteopenia    Peripheral neuropathy    Prostate cancer (HCC) 2011   PTSD (post-traumatic stress disorder)    RBBB (right bundle branch block)    SBO (small bowel obstruction) (HCC)    Skin cancer of scalp     Surgical History: Past Surgical History:  Procedure Laterality Date   COLECTOMY  07/12/2018   with end ileostomy   COLONOSCOPY Left 06/07/2018   Procedure: COLONOSCOPY;  Surgeon: Janalyn Keene NOVAK, MD;  Location: [facility] ENDOSCOPY;  Service: Endoscopy;  Laterality: Left;   COLONOSCOPY WITH ESOPHAGOGASTRODUODENOSCOPY (EGD)  09/12/2010   COLONOSCOPY WITH PROPOFOL  N/A 04/06/2015   Procedure: COLONOSCOPY WITH PROPOFOL ;  Surgeon: Gladis RAYMOND Mariner, MD;  Location: [facility] ENDOSCOPY;  Service: Endoscopy;  Laterality: N/A;   COLONOSCOPY WITH PROPOFOL  N/A 06/07/2018   Procedure: COLONOSCOPY WITH PROPOFOL ;  Surgeon: Janalyn Keene NOVAK, MD;  Location: [facility] ENDOSCOPY;  Service:  Endoscopy;  Laterality: N/A;   HAMMER TOE SURGERY Left 01/05/2023   Procedure: HAMMER TOE CORRECTION; INJECTION OF FAT PAD GRAFT;  Surgeon: Silva Juliene SAUNDERS, DPM;  Location: [facility] ORS;  Service: Podiatry;  Laterality: Left;   INGUINAL HERNIA REPAIR Left 09/23/2014   Procedure: HERNIA REPAIR INGUINAL ADULT;  Surgeon: Louanne KANDICE Muse, MD;  Location: [facility] ORS;  Service: General;  Laterality: Left;   LASIK  2000   MENISCUS  REPAIR Left 05/15/2006   MOHS SURGERY     right scalp   SKIN GRAFT FULL THICKNESS ARM Right 1966   [place]   STRABISMUS SURGERY Right 07/17/2016   VASCULAR SURGERY  05/15/2013   s/p GI Bleed    Home Medications:  Allergies as of 11/28/2023       Reactions   Mirtazapine Other (See Comments)   confusion   Nsaids Other (See Comments)   Diverticular bleed   Tolectin [tolmetin] Other (See Comments)   Diverticular bleed   Cardizem  [diltiazem  Hcl] Rash   Celebrex [celecoxib] Itching, Rash   Effexor [venlafaxine] Other (See Comments)   Sedation         Medication List        Accurate as of November 28, 2023 10:39 AM. If you have any questions, ask your nurse or doctor.          STOP taking these medications    multivitamin with minerals Tabs tablet Stopped by: [name]       TAKE these medications    acetaminophen  325 MG tablet Commonly known as: TYLENOL  Take 1-2 tablets (325-650 mg total) by mouth every 4 (four) hours as needed for mild pain (pain score 1-3).   apixaban  2.5 MG Tabs tablet Commonly known as: ELIQUIS  Take 1 tablet (2.5 mg total) by mouth 2 (two) times daily.   gabapentin  600 MG tablet Commonly known as: NEURONTIN  Take 1 tablet (600 mg total) by mouth 2 (two) times daily as needed.   metoprolol  tartrate 25 MG tablet Commonly known as: LOPRESSOR  Take 1 tablet (25 mg total) by mouth 2 (two) times daily.   simvastatin  20 MG tablet Commonly known as: ZOCOR  Take 20 mg by mouth at bedtime.   traMADol  50 MG tablet Commonly known as: ULTRAM  Take 50 mg by mouth every 6 (six) hours as needed for moderate pain.        Allergies:  Allergies  Allergen Reactions   Mirtazapine Other (See Comments)    confusion   Nsaids Other (See Comments)    Diverticular bleed   Tolectin [Tolmetin] Other (See Comments)    Diverticular bleed   Cardizem  [Diltiazem  Hcl] Rash   Celebrex [Celecoxib] Itching and Rash   Effexor [Venlafaxine] Other  (See Comments)    Sedation     Family History: Family History  Problem Relation Age of Onset   Dementia Mother    Cancer Father    Cancer Brother     Social History:  reports that he quit smoking about 54 years ago. His smoking use included cigarettes. He has been exposed to tobacco smoke. He has never used smokeless tobacco. He reports that he does not drink alcohol and does not use drugs.   Physical Exam: BP 129/74   Pulse 80   Ht 5' 10 (1.778 m)   Wt 167 lb (75.8 kg)   BMI 23.96 kg/m   Constitutional:  Alert and oriented, No acute distress. HEENT: [place] AT Respiratory: Normal respiratory effort, no increased work of breathing. Psychiatric:  Normal mood and affect.   Assessment & Plan:    1. Recurrent gross hematuria No recurrent hematuria in the last 2 months He would like to avoid cystoscopy with bladder biopsies if at all possible Will repeat his urine cytology today and he will be notified with the results and further recommendations   [name], MD  [facility] [address] [place], [place] [address] [phone]

## 2023-11-30 ENCOUNTER — Telehealth: Payer: Self-pay

## 2023-11-30 NOTE — Telephone Encounter (Signed)
 Pt calls triage line and states that since his most recent visit he has had 2 episodes of gross hematuria with strenuous activity. Of note we are waiting on urine cytology results, appears pt was offered bladder bx in May 2025 but declined.   Called pt he denies, fever, clots or pain. Advised pt we would contact him with cytology results when they are available. Pt voiced understanding.

## 2023-12-14 DIAGNOSIS — C61 Malignant neoplasm of prostate: Principal | ICD-10-CM

## 2023-12-27 ENCOUNTER — Encounter
Admit: 2023-12-27 | Discharge: 2023-12-28 | Payer: MEDICARE | Attending: Hematology & Oncology | Primary: Hematology & Oncology

## 2024-03-28 DIAGNOSIS — C61 Malignant neoplasm of prostate: Principal | ICD-10-CM

## 2024-04-01 ENCOUNTER — Other Ambulatory Visit: Payer: Self-pay | Admitting: Cardiovascular Disease

## 2024-04-01 NOTE — Telephone Encounter (Signed)
 Prescription refill request for Eliquis  received. Indication:aflutter Last office visit:3/25 Scr:1.3  7/25 Age: 83 Weight:75.8  kg  Prescription refilled

## 2024-04-03 ENCOUNTER — Encounter
Admit: 2024-04-03 | Discharge: 2024-04-04 | Payer: MEDICARE | Attending: Hematology & Oncology | Primary: Hematology & Oncology

## 2024-04-23 ENCOUNTER — Ambulatory Visit: Payer: Self-pay | Admitting: Urology

## 2024-04-23 ENCOUNTER — Encounter: Payer: Self-pay | Admitting: Urology

## 2024-04-23 VITALS — BP 141/90 | HR 68 | Ht 70.0 in | Wt 165.0 lb

## 2024-04-23 DIAGNOSIS — R310 Gross hematuria: Secondary | ICD-10-CM | POA: Diagnosis not present

## 2024-04-23 LAB — URINALYSIS, COMPLETE
Bilirubin, UA: NEGATIVE
Glucose, UA: NEGATIVE
Ketones, UA: NEGATIVE
Leukocytes,UA: NEGATIVE
Nitrite, UA: NEGATIVE
Specific Gravity, UA: 1.03 (ref 1.005–1.030)
Urobilinogen, Ur: 0.2 mg/dL (ref 0.2–1.0)
pH, UA: 6 (ref 5.0–7.5)

## 2024-04-23 LAB — MICROSCOPIC EXAMINATION: RBC, Urine: >30 /HPF — AB (ref 0–2)

## 2024-04-23 NOTE — Progress Notes (Unsigned)
 04/23/2024 2:02 PM   Ryan Allison [birth year] [id number]  Referring provider: Fernande Ophelia JINNY DOUGLAS, MD [address] [facility] [place],  [place] [address]  Chief Complaint  Patient presents with   Hematuria   Urologic history: 1.  Lower urinary tract symptoms -Obstructive voiding symptoms -Orthostatic changes with tamsulosin ; no improvement silodosin  -Cystoscopy 03/11/2020 with moderate lateral lobe enlargement with prominent hypervascularity -Follow-up cystoscopy 11/18/2021 with similar findings   2.  Recurrent gross hematuria -Cystoscopy as above -CT 01/2019 with right nephrolithiasis - Urine cytology 08/02/2023 showed dysplastic urothelial cells -Follow-up urine cytology 11/28/2023 showed atypical urothelial cells   3.  Intermediate risk prostate cancer -Followed [facility] oncology -Initially treated EBRT + ADT 2011 -Nodal mets via PET Axumin 2018 -Started bicalutamide  monotherapy with PSA 33.6; decreased to 0.3 on bicalutamide  however this was stopped secondary to questionable side effects 2021 -Currently followed [facility] oncology   HPI: Ryan Allison is a 83 y.o. male presents for follow-up visit.  Cystoscopy with possible bladder biopsy has been discussed at last 2 office visits and he has elected to hold off Since his last visit he has noted worsening intermittent gross hematuria.  Usually notices blood first void in the morning and typically remains clear for the rest of the day No bothersome LUTS Last saw medical oncology at [facility] 04/03/2024.  PSA was 2.7 (up from 0.3 after stopping bicalutamide ).  He has elected to start intermittent therapy and treatment will be restarted when PSA is >10   PMH: Past Medical History:  Diagnosis Date   Amblyopia of right eye    Anxiety    Aortic atherosclerosis    Arthritis    Atrial fibrillation and flutter (HCC) 05/2018   a.) CHA2DS2VASc = 4 (age x 2, HTN, vascular disease history); b.) rate/rhythm maintained on oral  metoprolol  tartrate; chronically anticoagulated with dose reduced apixaban  (experienced hematuria on rivaroxaban )   B12 deficiency    Cervical spondylosis with myelopathy    Chronic kidney disease, stage 3 (HCC)    DDD (degenerative disc disease), lumbar    Diverticulosis    Esophageal dysphagia    External hemorrhoids    GERD (gastroesophageal reflux disease)    GI bleed    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   History of echocardiogram    a.) TTE 06/07/2018: EF 60-65%, no RWMAs, mild MR, PASP 33 mmHg; b.) TTE 09/19/2019: EF 50-55%, mild LVH, mod RVE, mild LA dil, mild-mod TR, mild MR, PASP 42.5; c.) TTE 09/14/2022: EF 60-65%, mod basal-septal LVH, mild MR/AR, mod TR, AoV sclerosis, Ao root 37 mm   History of GI diverticular bleed    a.) required prolonged ICU admission at [facility]   History of kidney stones    HLD (hyperlipidemia)    Hyperlipidemia    Hypertension    Kyphoscoliosis    Meniscus tear    On apixaban  therapy    OSA (obstructive sleep apnea)    a.) does not require nocturnal PAP therapy   Osteopenia    Peripheral neuropathy    Prostate cancer (HCC) 2011   PTSD (post-traumatic stress disorder)    RBBB (right bundle branch block)    SBO (small bowel obstruction) (HCC)    Skin cancer of scalp     Surgical History: Past Surgical History:  Procedure Laterality Date   COLECTOMY  07/12/2018   with end ileostomy   COLONOSCOPY Left 06/07/2018   Procedure: COLONOSCOPY;  Surgeon: Janalyn Keene NOVAK, MD;  Location: [facility] ENDOSCOPY;  Service:  Endoscopy;  Laterality: Left;   COLONOSCOPY WITH ESOPHAGOGASTRODUODENOSCOPY (EGD)  09/12/2010   COLONOSCOPY WITH PROPOFOL  N/A 04/06/2015   Procedure: COLONOSCOPY WITH PROPOFOL ;  Surgeon: Gladis RAYMOND Mariner, MD;  Location: [facility] ENDOSCOPY;  Service: Endoscopy;  Laterality: N/A;   COLONOSCOPY WITH PROPOFOL  N/A 06/07/2018   Procedure: COLONOSCOPY WITH PROPOFOL ;  Surgeon: Janalyn Keene NOVAK, MD;  Location: [facility] ENDOSCOPY;  Service: Endoscopy;   Laterality: N/A;   HAMMER TOE SURGERY Left 01/05/2023   Procedure: HAMMER TOE CORRECTION; INJECTION OF FAT PAD GRAFT;  Surgeon: Silva Juliene SAUNDERS, DPM;  Location: [facility] ORS;  Service: Podiatry;  Laterality: Left;   INGUINAL HERNIA REPAIR Left 09/23/2014   Procedure: HERNIA REPAIR INGUINAL ADULT;  Surgeon: Louanne KANDICE Muse, MD;  Location: [facility] ORS;  Service: General;  Laterality: Left;   LASIK  2000   MENISCUS REPAIR Left 05/15/2006   MOHS SURGERY     right scalp   SKIN GRAFT FULL THICKNESS ARM Right 1966   [place]   STRABISMUS SURGERY Right 07/17/2016   VASCULAR SURGERY  05/15/2013   s/p GI Bleed    Home Medications:  Allergies as of 04/23/2024       Reactions   Mirtazapine Other (See Comments)   confusion   Nsaids Other (See Comments)   Diverticular bleed   Tolectin [tolmetin] Other (See Comments)   Diverticular bleed   Cardizem  [diltiazem  Hcl] Rash   Celebrex [celecoxib] Itching, Rash   Effexor [venlafaxine] Other (See Comments)   Sedation         Medication List        Accurate as of April 23, 2024  2:02 PM. If you have any questions, ask your nurse or doctor.          acetaminophen  325 MG tablet Commonly known as: TYLENOL  Take 1-2 tablets (325-650 mg total) by mouth every 4 (four) hours as needed for mild pain (pain score 1-3).   Eliquis  2.5 MG Tabs tablet Generic drug: apixaban  TAKE 1 TABLET TWICE A DAY   gabapentin  600 MG tablet Commonly known as: NEURONTIN  Take 1 tablet (600 mg total) by mouth 2 (two) times daily as needed.   metoprolol  tartrate 25 MG tablet Commonly known as: LOPRESSOR  Take 1 tablet (25 mg total) by mouth 2 (two) times daily.   simvastatin  20 MG tablet Commonly known as: ZOCOR  Take 20 mg by mouth at bedtime.   traMADol  50 MG tablet Commonly known as: ULTRAM  Take 50 mg by mouth every 6 (six) hours as needed for moderate pain.        Allergies:  Allergies  Allergen Reactions   Mirtazapine Other (See  Comments)    confusion   Nsaids Other (See Comments)    Diverticular bleed   Tolectin [Tolmetin] Other (See Comments)    Diverticular bleed   Cardizem  [Diltiazem  Hcl] Rash   Celebrex [Celecoxib] Itching and Rash   Effexor [Venlafaxine] Other (See Comments)    Sedation     Family History: Family History  Problem Relation Age of Onset   Dementia Mother    Cancer Father    Cancer Brother     Social History:  reports that he quit smoking about 54 years ago. His smoking use included cigarettes. He has been exposed to tobacco smoke. He has never used smokeless tobacco. He reports that he does not drink alcohol and does not use drugs.   Physical Exam: BP (!) 141/90   Pulse 68   Ht 5' 10 (1.778 m)   Wt  165 lb (74.8 kg)   BMI 23.68 kg/m   Constitutional:  Alert, No acute distress. HEENT: [place] AT Respiratory: Normal respiratory effort, no increased work of breathing. Psychiatric: Normal mood and affect.  Laboratory Data:  Urinalysis Dipstick 1+ protein/3+ blood Microscopy >30 RBC   Assessment & Plan:    1. Gross hematuria (Primary) Persistent intermittent gross hematuria He has elected to schedule cystoscopy Will plan on cystoscopy under sedation with bilateral retrograde pyelogram and possible bladder biopsy/TURBT/bladder fulguration Potential risks discussed including bleeding, infection/sepsis and rarely bladder injury. All questions were answered  ***Orders  Ryan JAYSON Barba, MD  [facility] [address] [place], [place] [address] [phone]

## 2024-04-23 NOTE — H&P (View-Only) (Signed)
 "  04/23/2024 2:02 PM   Ryan Allison 1940/05/17 [id number]  Referring provider: Fernande Ophelia JINNY DOUGLAS, MD [address] [facility] [place],  [place] [address]  Chief Complaint  Patient presents with   Hematuria   Urologic history: 1.  Lower urinary tract symptoms -Obstructive voiding symptoms -Orthostatic changes with tamsulosin ; no improvement silodosin  -Cystoscopy 03/11/2020 with moderate lateral lobe enlargement with prominent hypervascularity -Follow-up cystoscopy 11/18/2021 with similar findings   2.  Recurrent gross hematuria -Cystoscopy as above -CT 01/2019 with right nephrolithiasis - Urine cytology 08/02/2023 showed dysplastic urothelial cells -Follow-up urine cytology 11/28/2023 showed atypical urothelial cells   3.  Intermediate risk prostate cancer -Followed [facility] oncology -Initially treated EBRT + ADT 2011 -Nodal mets via PET Axumin 2018 -Started bicalutamide  monotherapy with PSA 33.6; decreased to 0.3 on bicalutamide  however this was stopped secondary to questionable side effects 2021 -Currently followed [facility] oncology   HPI: Ryan Allison is a 83 y.o. male presents for follow-up visit.  Cystoscopy with possible bladder biopsy has been discussed at last 2 office visits and he has elected to hold off Since his last visit he has noted worsening intermittent gross hematuria.  Usually notices blood first void in the morning and typically remains clear for the rest of the day No bothersome LUTS Last saw medical oncology at [facility] 04/03/2024.  PSA was 2.7 (up from 0.3 after stopping bicalutamide ).  He has elected to start intermittent therapy and treatment will be restarted when PSA is >10   PMH: Past Medical History:  Diagnosis Date   Amblyopia of right eye    Anxiety    Aortic atherosclerosis    Arthritis    Atrial fibrillation and flutter (HCC) 05/2018   a.) CHA2DS2VASc = 4 (age x 2, HTN, vascular disease history); b.) rate/rhythm maintained on oral  metoprolol  tartrate; chronically anticoagulated with dose reduced apixaban  (experienced hematuria on rivaroxaban )   B12 deficiency    Cervical spondylosis with myelopathy    Chronic kidney disease, stage 3 (HCC)    DDD (degenerative disc disease), lumbar    Diverticulosis    Esophageal dysphagia    External hemorrhoids    GERD (gastroesophageal reflux disease)    GI bleed    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   History of echocardiogram    a.) TTE 06/07/2018: EF 60-65%, no RWMAs, mild MR, PASP 33 mmHg; b.) TTE 09/19/2019: EF 50-55%, mild LVH, mod RVE, mild LA dil, mild-mod TR, mild MR, PASP 42.5; c.) TTE 09/14/2022: EF 60-65%, mod basal-septal LVH, mild MR/AR, mod TR, AoV sclerosis, Ao root 37 mm   History of GI diverticular bleed    a.) required prolonged ICU admission at [facility]   History of kidney stones    HLD (hyperlipidemia)    Hyperlipidemia    Hypertension    Kyphoscoliosis    Meniscus tear    On apixaban  therapy    OSA (obstructive sleep apnea)    a.) does not require nocturnal PAP therapy   Osteopenia    Peripheral neuropathy    Prostate cancer (HCC) 2011   PTSD (post-traumatic stress disorder)    RBBB (right bundle branch block)    SBO (small bowel obstruction) (HCC)    Skin cancer of scalp     Surgical History: Past Surgical History:  Procedure Laterality Date   COLECTOMY  07/12/2018   with end ileostomy   COLONOSCOPY Left 06/07/2018   Procedure: COLONOSCOPY;  Surgeon: Janalyn Keene NOVAK, MD;  Location: [facility] ENDOSCOPY;  Service: Endoscopy;  Laterality: Left;   COLONOSCOPY WITH ESOPHAGOGASTRODUODENOSCOPY (EGD)  09/12/2010   COLONOSCOPY WITH PROPOFOL  N/A 04/06/2015   Procedure: COLONOSCOPY WITH PROPOFOL ;  Surgeon: Gladis RAYMOND Mariner, MD;  Location: [facility] ENDOSCOPY;  Service: Endoscopy;  Laterality: N/A;   COLONOSCOPY WITH PROPOFOL  N/A 06/07/2018   Procedure: COLONOSCOPY WITH PROPOFOL ;  Surgeon: Janalyn Keene NOVAK, MD;  Location: [facility] ENDOSCOPY;  Service: Endoscopy;   Laterality: N/A;   HAMMER TOE SURGERY Left 01/05/2023   Procedure: HAMMER TOE CORRECTION; INJECTION OF FAT PAD GRAFT;  Surgeon: Silva Juliene SAUNDERS, DPM;  Location: [facility] ORS;  Service: Podiatry;  Laterality: Left;   INGUINAL HERNIA REPAIR Left 09/23/2014   Procedure: HERNIA REPAIR INGUINAL ADULT;  Surgeon: Louanne KANDICE Muse, MD;  Location: [facility] ORS;  Service: General;  Laterality: Left;   LASIK  2000   MENISCUS REPAIR Left 05/15/2006   MOHS SURGERY     right scalp   SKIN GRAFT FULL THICKNESS ARM Right 1966   [place]   STRABISMUS SURGERY Right 07/17/2016   VASCULAR SURGERY  05/15/2013   s/p GI Bleed    Home Medications:  Allergies as of 04/23/2024       Reactions   Mirtazapine Other (See Comments)   confusion   Nsaids Other (See Comments)   Diverticular bleed   Tolectin [tolmetin] Other (See Comments)   Diverticular bleed   Cardizem  [diltiazem  Hcl] Rash   Celebrex [celecoxib] Itching, Rash   Effexor [venlafaxine] Other (See Comments)   Sedation         Medication List        Accurate as of April 23, 2024  2:02 PM. If you have any questions, ask your nurse or doctor.          acetaminophen  325 MG tablet Commonly known as: TYLENOL  Take 1-2 tablets (325-650 mg total) by mouth every 4 (four) hours as needed for mild pain (pain score 1-3).   Eliquis  2.5 MG Tabs tablet Generic drug: apixaban  TAKE 1 TABLET TWICE A DAY   gabapentin  600 MG tablet Commonly known as: NEURONTIN  Take 1 tablet (600 mg total) by mouth 2 (two) times daily as needed.   metoprolol  tartrate 25 MG tablet Commonly known as: LOPRESSOR  Take 1 tablet (25 mg total) by mouth 2 (two) times daily.   simvastatin  20 MG tablet Commonly known as: ZOCOR  Take 20 mg by mouth at bedtime.   traMADol  50 MG tablet Commonly known as: ULTRAM  Take 50 mg by mouth every 6 (six) hours as needed for moderate pain.        Allergies:  Allergies  Allergen Reactions   Mirtazapine Other (See  Comments)    confusion   Nsaids Other (See Comments)    Diverticular bleed   Tolectin [Tolmetin] Other (See Comments)    Diverticular bleed   Cardizem  [Diltiazem  Hcl] Rash   Celebrex [Celecoxib] Itching and Rash   Effexor [Venlafaxine] Other (See Comments)    Sedation     Family History: Family History  Problem Relation Age of Onset   Dementia Mother    Cancer Father    Cancer Brother     Social History:  reports that he quit smoking about 54 years ago. His smoking use included cigarettes. He has been exposed to tobacco smoke. He has never used smokeless tobacco. He reports that he does not drink alcohol and does not use drugs.   Physical Exam: BP (!) 141/90   Pulse 68   Ht 5' 10 (1.778 m)  Wt 165 lb (74.8 kg)   BMI 23.68 kg/m   Constitutional:  Alert, No acute distress. HEENT: [place] AT Respiratory: Normal respiratory effort, no increased work of breathing. Psychiatric: Normal mood and affect.  Laboratory Data:  Urinalysis Dipstick 1+ protein/3+ blood Microscopy >30 RBC   Assessment & Plan:    1. Gross hematuria (Primary) Persistent intermittent gross hematuria He has elected to schedule cystoscopy Will plan on cystoscopy under sedation with bilateral retrograde pyelogram and possible bladder biopsy/TURBT/bladder fulguration Potential risks discussed including bleeding, infection/sepsis and rarely bladder injury. All questions were answered   [name], MD  [facility] [address] [place], [place] [address] [phone] "

## 2024-04-25 ENCOUNTER — Encounter: Payer: Self-pay | Admitting: Urology

## 2024-04-28 ENCOUNTER — Telehealth: Payer: Self-pay

## 2024-04-28 ENCOUNTER — Other Ambulatory Visit: Payer: Self-pay

## 2024-04-28 DIAGNOSIS — N029 Recurrent and persistent hematuria with unspecified morphologic changes: Secondary | ICD-10-CM

## 2024-04-28 NOTE — Progress Notes (Signed)
° °  [facility] Surgical Posting Form  Surgery Date: Date: 05/16/2024  Surgeon: Dr. 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

## 2024-04-28 NOTE — Progress Notes (Signed)
 Surgical Physician Order Form [facility]   Dr. Glendia Barba, MD  * Scheduling expectation : Next Available  *Length of Case: 45 minutes  *Clearance needed: yes  *Anticoagulation Instructions: Hold Eliquis   *Aspirin Instructions: N/A  *Post-op visit Date/Instructions: TBD  *Diagnosis: Recurrent gross hematuria  *Procedure: Cystoscopy; bilateral retrograde pyelogram; possible bladder biopsy/TURBT   Additional orders: N/A  -Admit type: OUTpatient  -Anesthesia: Choice  -VTE Prophylaxis Standing Order SCD's       Other:   -Standing Lab Orders Per Anesthesia    Lab other: Urine PCR Vikor  -Standing Test orders EKG/Chest x-ray per Anesthesia       Test other:   - Medications:  Ceftriaxone(Rocephin) 1gm IV  -Other orders:  N/A

## 2024-04-28 NOTE — Progress Notes (Signed)
°  Phone Number: [phone] for Surgical Coordinator Fax Number: [phone]  REQUEST FOR SURGICAL CLEARANCE       Date: 04/28/2024  Faxed to: [facility]  Surgeon: Dr. 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   Risk Assessment:    Low   []       Moderate   []     High   []           This patient is optimized for surgery  YES []       NO   []    I recommend further assessment/workup prior to surgery. YES []      NO  []   Appointment scheduled for: _______________________   Further recommendations: ____________________________________     Physician Signature:__________________________________   Printed Name: ________________________________________   Date: _________________

## 2024-04-28 NOTE — Telephone Encounter (Signed)
 Per Dr. 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   Patient was instructed that Dr. Twylla will require them to provide a pre-op UA & CX prior to surgery. This was ordered and scheduled drop off appointment was made for 04/29/2024.    Patient was directed to call [phone] between 1-3pm the day before surgery to find out surgical arrival time.  Instructions were given not to eat or drink from midnight on the night before surgery and have a driver for the day of surgery. On the surgery day patient was instructed to enter through the [facility] entrance of [facility] report the Same Day Surgery desk.   Pre-Admit Testing will be in contact via phone to set up an interview with the anesthesia team to review your history and medications prior to surgery.   Reminder of this information was given to the patient.

## 2024-04-29 ENCOUNTER — Ambulatory Visit: Admitting: Urology

## 2024-04-29 NOTE — Progress Notes (Unsigned)
 Pt dropped off pre op urine sample for [employer] testing. Preformed test on urine and packaged to send off via [employer] to [employer].  Mathew Pinal, RN

## 2024-05-01 ENCOUNTER — Telehealth (HOSPITAL_BASED_OUTPATIENT_CLINIC_OR_DEPARTMENT_OTHER): Payer: Self-pay | Admitting: *Deleted

## 2024-05-01 NOTE — Telephone Encounter (Signed)
 Pharmacy please advise on holding Eliquis  prior to cystoscopy with retrograde pyelogram, biopsy, TURBT scheduled for 05/16/2024. Thank you.   Last labs: May 2025

## 2024-05-01 NOTE — Telephone Encounter (Signed)
° °  Pre-operative Risk Assessment    Patient Name: Ryan Allison  DOB: 1940-08-08 MRN: [id number]   Date of last office visit: 08/03/23 DR. GOLLAN Date of next office visit: NONE   Request for Surgical Clearance    Procedure:   CYSTOSCOPY N/A   CYSTOSCOPY, WITH RETROGRADE PYELOGRAM Bilateral   CYSTOSCOPY, WITH BIOPSY N/A   TURBT (TRANSURETHRAL RESECTION OF BLADDER TUMOR)      Date of Surgery:  Clearance 05/16/24                                Surgeon:  DR. GLENDIA STOIOFF Surgeon's Group or Practice Name:  [facility] Phone number:  [phone] MELISSA HADLEY, CMA Fax number:  [phone]   Type of Clearance Requested:   - Medical  - Pharmacy:  Hold Apixaban  (Eliquis )     Type of Anesthesia:  CHOICE   Additional requests/questions:    Bonney Niels Jest   05/01/2024, 4:10 PM

## 2024-05-02 DIAGNOSIS — G629 Polyneuropathy, unspecified: Secondary | ICD-10-CM | POA: Insufficient documentation

## 2024-05-02 NOTE — Telephone Encounter (Signed)
 Patient with diagnosis of A Fib on Eliquis  for anticoagulation.    Procedure: cystoscopy with retrograde pyelogram, biopsy, TURBT  Date of procedure: 05/16/24   CHA2DS2-VASc Score = 7  This indicates a 11.2% annual risk of stroke. The patient's score is based upon: CHF History: 1 HTN History: 1 Diabetes History: 0 Stroke History: 2 Vascular Disease History: 1 Age Score: 2 Gender Score: 0    CrCl 47 ml/min Platelet count 159K  Patient has not had an Afib/aflutter ablation in the last 3 months, DCCV within the last 4 weeks or a watchman implanted in the last 45 days   Patient will need to hold Eliquis  for 3 days for procedure. Due to elevated risk score, will route to Dr Gollan for input  **This guidance is not considered finalized until pre-operative APP has relayed final recommendations.**

## 2024-05-06 ENCOUNTER — Other Ambulatory Visit: Payer: Self-pay

## 2024-05-06 ENCOUNTER — Encounter
Admission: RE | Admit: 2024-05-06 | Discharge: 2024-05-06 | Disposition: A | Discharge status: Home / Self Care | Source: Ambulatory Visit | Attending: Urology | Admitting: Urology

## 2024-05-06 VITALS — Ht 70.0 in | Wt 165.0 lb

## 2024-05-06 DIAGNOSIS — I4892 Unspecified atrial flutter: Secondary | ICD-10-CM

## 2024-05-06 DIAGNOSIS — I10 Essential (primary) hypertension: Secondary | ICD-10-CM

## 2024-05-06 DIAGNOSIS — N1831 Chronic kidney disease, stage 3a: Secondary | ICD-10-CM

## 2024-05-06 DIAGNOSIS — E162 Hypoglycemia, unspecified: Secondary | ICD-10-CM

## 2024-05-06 DIAGNOSIS — E538 Deficiency of other specified B group vitamins: Secondary | ICD-10-CM

## 2024-05-06 DIAGNOSIS — I480 Paroxysmal atrial fibrillation: Secondary | ICD-10-CM

## 2024-05-06 DIAGNOSIS — R799 Abnormal finding of blood chemistry, unspecified: Secondary | ICD-10-CM

## 2024-05-06 DIAGNOSIS — I700 Atherosclerosis of aorta: Secondary | ICD-10-CM

## 2024-05-06 DIAGNOSIS — E785 Hyperlipidemia, unspecified: Secondary | ICD-10-CM

## 2024-05-06 NOTE — Patient Instructions (Addendum)
 Your procedure is scheduled on: Friday 05/16/24 To find out your arrival time, please call [phone] between 1PM - 3PM on: Wednesday 05/14/24 Report to the Registration Desk on the 1st floor of the [facility]. If your arrival time is 6:00 am, do not arrive before that time as the [facility] entrance doors do not open until 6:00 am.  REMEMBER: Instructions that are not followed completely may result in serious medical risk, up to and including death; or upon the discretion of your surgeon and anesthesiologist your surgery may need to be rescheduled.  Do not eat food or drink liquids after midnight the night before surgery.  No gum chewing or hard candies.  One week prior to surgery: Stop Anti-inflammatories (NSAIDS) such as Advil, Aleve, Ibuprofen, Motrin, Naproxen, Naprosyn and Aspirin based products such as Excedrin, Goody's Powder, BC Powder.  You may however, continue to take Tylenol  if needed for pain up until the day of surgery.  Stop ANY OVER THE COUNTER supplements and vitamins for at least 7 days until after surgery.  **Follow recommendations regarding stopping blood thinners.** Stop your Eliquis  and Aspirin. Last dose 05/12/24.  Continue taking all of your other prescription medications up until the day of surgery.  ON THE DAY OF SURGERY ONLY TAKE THESE MEDICATIONS WITH SIPS OF WATER:  loperamide (IMODIUM) 2 MG capsule  metoprolol  tartrate (LOPRESSOR ) 25 MG tablet   No Alcohol for 24 hours before or after surgery.  No Smoking including e-cigarettes for 24 hours before surgery.  No chewable tobacco products for at least 6 hours before surgery.  No nicotine patches on the day of surgery.  On the morning of surgery brush your teeth with toothpaste and water, you may rinse your mouth with mouthwash if you wish. Do not swallow any toothpaste or mouthwash.  Shower the night before or the morning of 05/16/24  Do not shave body hair from the neck down 48 hours before  surgery.  Do not wear lotions, powders, or perfumes.   Wear comfortable clothing (specific to your surgery type) to the hospital.  Do not wear jewelry  Contact lenses, hearing aids and dentures may not be worn into surgery.  Do not bring valuables to the hospital. [facility] is not responsible for any missing/lost belongings or valuables.   Notify your doctor if there is any change in your medical condition (cold, fever, infection).  After surgery, you can help prevent lung complications by doing breathing exercises.  Take deep breaths and cough every 1-2 hours.   If you are being discharged the day of surgery, you will not be allowed to drive home. You will need a responsible individual to drive you home and stay with you for 24 hours after surgery.   Please call the Pre-admissions Testing Dept. at [phone] if you have any questions about these instructions.  Surgery Visitation Policy:  Patients having surgery or a procedure may have two visitors.  Children under the age of 39 must have an adult with them who is not the patient.  Merchandiser, Retail to address health-related social needs:  [email or web address]

## 2024-05-07 ENCOUNTER — Encounter: Payer: Self-pay | Admitting: Urology

## 2024-05-07 NOTE — Progress Notes (Signed)
 " Perioperative / Anesthesia Services  Pre-Admission Testing Clinical Review / Pre-Operative Anesthesia Consult  Date: 05/12/2024  PATIENT DEMOGRAPHICS: Name: Ryan Allison DOB: 1940/11/29 MRN:   [id number]  Note: Available PAT nursing documentation and vital signs have been reviewed. Clinical nursing staff has updated patient's PMH/PSHx, current medication list, and drug allergies/intolerances to ensure complete and comprehensive history available to assist care teams in MDM as it pertains to the aforementioned surgical procedure and anticipated anesthetic course. Extensive review of available clinical information personally performed. Nursing documentation reviewed. [place] PMH and PSHx updated with any diagnoses and/or procedures that I have knowledge of that may have been inadvertently omitted during his intake with the pre-admission testing department's nursing staff.  PLANNED SURGICAL PROCEDURE(S):   Case: [id number] Date/Time: 05/16/24 1004   Procedures:      CYSTOSCOPY     CYSTOSCOPY, WITH RETROGRADE PYELOGRAM (Bilateral)     CYSTOSCOPY, WITH BIOPSY     TURBT (TRANSURETHRAL RESECTION OF BLADDER TUMOR)   Anesthesia type: Choice   Diagnosis: Recurrent gross hematuria [N02.9]   Pre-op diagnosis: Recurrent Gross Hematuria   Location: [facility] OR ROOM 10 / [facility] ORS FOR ANESTHESIA GROUP   Surgeons: Twylla Glendia BROCKS, MD        CLINICAL DISCUSSION: JORGEN WOLFINGER is a 83 y.o. male who is submitted for pre-surgical anesthesia review and clearance prior to him undergoing the above procedure. Patient is a Former Smoker (quit 05/1969). Pertinent PMH includes: atrial fibrillation/flutter, HFpEF, CVA, aortic atherosclerosis, RBBB, HTN, HLD, CKD-III, OSAH (no nocturnal PAP therapy), esophageal dysphagia, GERD (no daily Tx), bladder tumor, remote prostate cancer, OA, kyphoscoliosis, cervical spondylosis with myelopathy, lumbar DDD, PTSD, anxiety, depression.  Patient is followed by cardiology  (Gollan, MD). He was last seen in the cardiology clinic on 08/03/2023; notes reviewed. At the time of his clinic visit, patient complaining of episodes of shortness of breath, weakness, and difficulty breathing at times.  Patient experiencing episodes of intermittent palpitations that were self-limiting.  Patient remained in atrial flutter.  He denied any episodes of chest pain, PND, orthopnea, significant peripheral edema, vertiginous symptoms, or presyncope/syncope. Patient with a past medical history significant for cardiovascular diagnoses. Documented physical exam was grossly benign, providing no evidence of acute exacerbation and/or decompensation of the patient's known cardiovascular conditions.  Most recent TTE performed on 09/14/2022 revealed a normal left ventricular systolic function with an EF of 60-65%. There was moderate asymmetric LVH of the basal septal segment.  Left ventricular diastolic Doppler parameters were indeterminate.  Right ventricular size and function was normal with a TAPSE measuring 2.1 cm (normal range >/= 1.6 cm).  PA pressure is normal.  There was mild mitral and aortic valve regurgitation.  Borderline dilatation of the aortic root measuring up to 37 mm was observed.  Patient suffered an acute CVA on 02/15/2023.  MRI imaging of the brain revealed a small infarct in the RIGHT cerebellar hemisphere.  Patient has no significant residual deficits following neurological event.  Patient with an atrial fibrillation/flutter diagnosis; CHA2DS2-VASc Score = 7 (age x 2, HTN, HFpEF, CVA x 2, vascular disease history). His rate and rhythm are currently being maintained on oral metoprolol  to tartrate. He is chronically anticoagulated using reduced dose apixaban ; reported to be compliant with therapy with no evidence or reports of GI/GU bleeding. Of note, patient previously on rivaroxaban , however due to gross hematuria, he was switched to apixaban . Cardiologist noted that patient did not  currently meet criteria for reduced dose DOAC therapy, therefore dose  was reduced secondary to the aforementioned hematuria. Blood pressure mildly elevated at 140/70 mmHg on currently prescribed metoprolol  to tartrate monotherapy.  Patient is on simvastatin  for his HLD diagnosis and ASCVD prevention. Patient is not diabetic.  Patient does have an OSAH diagnosis, however he does not require the use of nocturnal PAP therapy.  Functional capacity limited by patient's age and multiple medical comorbidities.  He is able to complete all of his ADLs/IADLs without cardiovascular limitation.  Per the DASI, patient felt to be able to achieve at least 4 METS of physical activity without experiencing any significant degrees of angina/anginal equivalent symptoms.  No changes were made to his medication regimen during his visit with cardiology.  Patient scheduled to follow-up with outpatient cardiology in 6 months or sooner if needed.  [name] is scheduled for an elective CYSTOSCOPY; CYSTOSCOPY, WITH RETROGRADE PYELOGRAM (Bilateral); CYSTOSCOPY, WITH BIOPSY; TURBT (TRANSURETHRAL RESECTION OF BLADDER TUMOR) on 05/16/2024 with Dr. [name], MD. Given patient's past medical history significant for cardiovascular diagnoses, presurgical cardiac clearance was sought by the PAT team. Per cardiology, based ACC/AHA guidelines, the patient's past medical history, and the amount of time since his last clinic visit, this patient would be at an overall MODERATE/ACCEPTABLE risk for the planned procedure without further cardiovascular testing or intervention at this time.   Again, this patient is on daily oral anticoagulation therapy using a DOAC medication. He is also on antithrombotic therapy  He has been instructed on recommendations for holding his apixaban  and daily low dose ASA for  3 days prior to his procedure with plans to restart as soon as postoperative bleeding risk felt to be minimized by his primary attending  surgeon. The patient has been instructed that his last dose should be on 05/12/2024.  Patient denies previous perioperative complications with anesthesia in the past. In review his EMR, it is noted that patient underwent a MAC anesthetic course here at [facility] (ASA IV) in 12/2022 without documented complications.   MOST RECENT VITAL SIGNS:    05/06/2024    1:49 PM 04/23/2024   10:51 AM 11/28/2023   10:07 AM  Vitals with BMI  Height 5' 10 5' 10 5' 10  Weight 165 lbs 165 lbs 167 lbs  BMI 23.68 23.68 23.96  Systolic  141 129  Diastolic  90 74  Pulse  68 80   PROVIDERS/SPECIALISTS: NOTE: Primary physician provider listed below. Patient may have been seen by APP or partner within same practice.   PROVIDER ROLE / SPECIALTY LAST SHERLEAN Barba Glendia JAYSON, MD Urology (Surgeon) 04/23/2024  Fernande Ophelia JINNY DOUGLAS, MD Primary Care Provider 04/01/2024  Perla Lye, MD Cardiology 08/03/2023; preop APP call 05/12/2024   ALLERGIES: Allergies[1]  CURRENT HOME MEDICATIONS:  acetaminophen  (TYLENOL ) 325 MG tablet   aspirin EC 81 MG tablet   ELIQUIS  2.5 MG TABS tablet   gabapentin  (NEURONTIN ) 600 MG tablet   loperamide (IMODIUM) 2 MG capsule   metoprolol  tartrate (LOPRESSOR ) 25 MG tablet   QUEtiapine (SEROQUEL) 25 MG tablet   simvastatin  (ZOCOR ) 20 MG tablet   traMADol  (ULTRAM ) 50 MG tablet   HISTORY: Past Medical History:  Diagnosis Date   (HFpEF) heart failure with preserved ejection fraction (HCC)    Amblyopia of right eye    Anxiety    Aortic atherosclerosis    Arthritis    Atrial fibrillation and flutter (HCC) 05/2018   a.) CHA2DS2VASc = 7 (age x 2, HTN, HFpEF, CVA x2, vascular  disease history) as of 05/07/2024; b.) rate/rhythm maintained on oral metoprolol  tartrate; chronically anticoagulated with dose reduced apixaban  (experienced hematuria on rivaroxaban )   B12 deficiency    Bladder tumor    Cerebral cavernoma (pontine)    Cervical spondylosis  with myelopathy    Chronic kidney disease, stage 3 (HCC)    DDD (degenerative disc disease), lumbar    Depression    Diverticulosis    Esophageal dysphagia    External hemorrhoids    GERD (gastroesophageal reflux disease)    GI bleed    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   History of echocardiogram    a.) TTE 06/07/2018: EF 60-65%, no RWMAs, mild MR, PASP 33 mmHg; b.) TTE 09/19/2019: EF 50-55%, mild LVH, mod RVE, mild LA dil, mild-mod TR, mild MR, PASP 42.5; c.) TTE 09/14/2022: EF 60-65%, mod basal-septal LVH, mild MR/AR, mod TR, AoV sclerosis, Ao root 37 mm   History of GI diverticular bleed    a.) required prolonged ICU admission at [facility]   History of kidney stones    HLD (hyperlipidemia)    Hyperlipidemia    Hypertension    Kyphoscoliosis    Meniscus tear    On apixaban  therapy    OSA (obstructive sleep apnea)    a.) does not require nocturnal PAP therapy   Osteopenia    Peripheral neuropathy    Prostate cancer (HCC) 2011   PTSD (post-traumatic stress disorder)    RBBB (right bundle branch block)    SBO (small bowel obstruction) (HCC)    Skin cancer of scalp    Stroke (HCC) 02/15/2023   a.) MRI brain 02/15/2023: acute RIGHT cerebellar hemishpere infarct   Past Surgical History:  Procedure Laterality Date   COLECTOMY  07/12/2018   with end ileostomy   COLONOSCOPY Left 06/07/2018   Procedure: COLONOSCOPY;  Surgeon: Janalyn Keene NOVAK, MD;  Location: [facility] ENDOSCOPY;  Service: Endoscopy;  Laterality: Left;   COLONOSCOPY WITH ESOPHAGOGASTRODUODENOSCOPY (EGD)  09/12/2010   COLONOSCOPY WITH PROPOFOL  N/A 04/06/2015   Procedure: COLONOSCOPY WITH PROPOFOL ;  Surgeon: Gladis RAYMOND Mariner, MD;  Location: [facility] ENDOSCOPY;  Service: Endoscopy;  Laterality: N/A;   COLONOSCOPY WITH PROPOFOL  N/A 06/07/2018   Procedure: COLONOSCOPY WITH PROPOFOL ;  Surgeon: Janalyn Keene NOVAK, MD;  Location: [facility] ENDOSCOPY;  Service: Endoscopy;  Laterality: N/A;   HAMMER TOE SURGERY Left 01/05/2023    Procedure: HAMMER TOE CORRECTION; INJECTION OF FAT PAD GRAFT;  Surgeon: Silva Juliene SAUNDERS, DPM;  Location: [facility] ORS;  Service: Podiatry;  Laterality: Left;   INGUINAL HERNIA REPAIR Left 09/23/2014   Procedure: HERNIA REPAIR INGUINAL ADULT;  Surgeon: Louanne KANDICE Muse, MD;  Location: [facility] ORS;  Service: General;  Laterality: Left;   LASIK  2000   MENISCUS REPAIR Left 05/15/2006   MOHS SURGERY     right scalp   SKIN GRAFT FULL THICKNESS ARM Right 1966   [place]   STRABISMUS SURGERY Right 07/17/2016   VASCULAR SURGERY  05/15/2013   s/p GI Bleed   Family History  Problem Relation Age of Onset   Dementia Mother    Cancer Father    Cancer Brother    Social History   Tobacco Use   Smoking status: Former    Current packs/day: 0.00    Types: Cigarettes    Quit date: 05/15/1969    Years since quitting: 55.0    Passive exposure: Past   Smokeless tobacco: Never  Substance Use Topics   Alcohol use: No   LABS:  Lab Results  Component Value Date   WBC 7.5 09/22/2023   HGB 14.2 09/22/2023   HCT 42.1 09/22/2023   MCV 99.8 09/22/2023   PLT 176 09/22/2023   Lab Results  Component Value Date   NA 135 09/22/2023   CL 97 (L) 09/22/2023   K 4.5 09/22/2023   CO2 28 09/22/2023   BUN 35 (H) 09/22/2023   CREATININE 1.42 (H) 09/22/2023   GFRNONAA 50 (L) 09/22/2023   CALCIUM  9.3 09/22/2023   PHOS 4.1 02/20/2023   ALBUMIN 4.1 09/22/2023   GLUCOSE 102 (H) 09/22/2023    ECG: Date: 09/22/2023  Time ECG obtained: 1732 PM Rate: 100 bpm Rhythm: atrial flutter Axis (leads I and aVF): normal Intervals: PR 190 ms. QRS 104 ms. QTc 536 ms. ST segment and T wave changes: No evidence of acute T wave abnormalities or significant ST segment elevation or depression.  Evidence of a possible, age undetermined, prior infarct:  Yes; inferior and anterior Comparison: Similar to previous tracing obtained on 08/03/2023   IMAGING / PROCEDURES: CT RENAL STONE STUDY performed on  07/28/2023 Nonobstructive bilateral nephrolithiasis measuring up to 10 mm on the left and 5 mm on the right. Right lower quadrant end ileostomy formation. Interstitial lung disease/pulmonary fibrosis. Aortic atherosclerosis   MR BRAIN WO CONTRAST performed on 02/15/2023 Small acute infarct in the right cerebellar hemisphere. Small focus of cortical encephalomalacia in the left anterior temporal lobe and moderate background chronic small-vessel ischemic changes. 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  TRANSTHORACIC ECHOCARDIOGRAM performed on 09/14/2022 Left ventricular ejection fraction, by estimation, is 60 to 65%. The left ventricle has normal function. The left ventricle has no regional wall motion abnormalities. There is moderate asymmetric left ventricular  hypertrophy of the basal-septal segment. Left ventricular diastolic parameters are indeterminate.  Right ventricular systolic function is normal. The right ventricular size is normal.  The mitral valve is normal in structure. Mild mitral valve regurgitation. No evidence of mitral stenosis.  Tricuspid valve regurgitation is moderate.  The aortic valve is tricuspid. Aortic valve regurgitation is mild. Aortic valve sclerosis is present, with no evidence of aortic valve stenosis.  There is borderline dilatation of the aortic root, measuring 37 mm.  The inferior vena cava is normal in size with greater than 50% respiratory variability, suggesting right atrial pressure of 3 mmHg.   IMPRESSION AND PLAN: AMILLION MACCHIA has been referred for pre-anesthesia review and clearance prior to him undergoing the planned anesthetic and procedural courses. Available labs, pertinent testing, and imaging results were personally reviewed by me in preparation for upcoming operative/procedural course. [facility] medical record has been updated following extensive record review and patient interview with PAT staff.   This patient has been appropriately cleared by  cardiology with an overall MODERATE/ACCEPTABLE risk of patient experiencing significant perioperative cardiovascular complications. here at [facility]. Based on clinical review performed today (05/12/2024), barring any significant acute changes in the patient's overall condition, it is anticipated that he will be able to proceed with the planned surgical intervention. Any acute changes in clinical condition may necessitate his procedure being postponed and/or cancelled. Patient will meet with anesthesia team (MD and/or CRNA) on the day of his procedure for preoperative evaluation/assessment. Questions regarding anesthetic course will be fielded at that time.   Pre-surgical instructions were reviewed with the patient during his PAT appointment, and questions were fielded to satisfaction by PAT clinical staff. He has been instructed on which medications that he will need to hold prior to surgery,  as well as the ones that have been deemed safe/appropriate to take on the day of his procedure. As part of the general education provided by PAT, patient made aware both verbally and in writing, that he would need to abstain from the use of any illegal substances during his perioperative course. He was advised that failure to follow the provided instructions could necessitate case cancellation or result in serious perioperative complications up to and including death. Patient encouraged to contact PAT and/or his surgeon's office to discuss any questions or concerns that may arise prior to surgery; verbalized understanding.   Dorise Pereyra, MSN, APRN, FNP-C, CEN [facility]  Perioperative Services Nurse Practitioner Phone: [phone] Fax: [phone] 05/12/2024 9:54 AM  NOTE: This note has been prepared using Dragon dictation software. Despite my best ability to proofread, there is always the potential that unintentional transcriptional errors may still occur  from this process.     [1]  Allergies Allergen Reactions   Mirtazapine Other (See Comments)    confusion   Nsaids Other (See Comments)    Diverticular bleed   Tolectin [Tolmetin] Other (See Comments)    Diverticular bleed   Cardizem  [Diltiazem  Hcl] Rash   Celebrex [Celecoxib] Itching and Rash   Effexor [Venlafaxine] Other (See Comments)    Sedation    "

## 2024-05-12 ENCOUNTER — Encounter
Admission: RE | Admit: 2024-05-12 | Discharge: 2024-05-12 | Disposition: A | Discharge status: Home / Self Care | Source: Ambulatory Visit | Attending: Urology | Admitting: Urology

## 2024-05-12 ENCOUNTER — Telehealth: Payer: Self-pay

## 2024-05-12 DIAGNOSIS — Z01818 Encounter for other preprocedural examination: Secondary | ICD-10-CM | POA: Diagnosis present

## 2024-05-12 DIAGNOSIS — E162 Hypoglycemia, unspecified: Secondary | ICD-10-CM | POA: Diagnosis not present

## 2024-05-12 DIAGNOSIS — I480 Paroxysmal atrial fibrillation: Secondary | ICD-10-CM | POA: Insufficient documentation

## 2024-05-12 DIAGNOSIS — E785 Hyperlipidemia, unspecified: Secondary | ICD-10-CM | POA: Insufficient documentation

## 2024-05-12 DIAGNOSIS — N1831 Chronic kidney disease, stage 3a: Secondary | ICD-10-CM | POA: Diagnosis not present

## 2024-05-12 DIAGNOSIS — I700 Atherosclerosis of aorta: Secondary | ICD-10-CM | POA: Insufficient documentation

## 2024-05-12 DIAGNOSIS — I4892 Unspecified atrial flutter: Secondary | ICD-10-CM | POA: Diagnosis not present

## 2024-05-12 DIAGNOSIS — E538 Deficiency of other specified B group vitamins: Secondary | ICD-10-CM | POA: Diagnosis not present

## 2024-05-12 DIAGNOSIS — I10 Essential (primary) hypertension: Secondary | ICD-10-CM | POA: Insufficient documentation

## 2024-05-12 DIAGNOSIS — R799 Abnormal finding of blood chemistry, unspecified: Secondary | ICD-10-CM

## 2024-05-12 LAB — BASIC METABOLIC PANEL WITH GFR
Anion gap: 9 (ref 5–15)
BUN: 21 mg/dL (ref 8–23)
CO2: 30 mmol/L (ref 22–32)
Calcium: 10.1 mg/dL (ref 8.9–10.3)
Chloride: 101 mmol/L (ref 98–111)
Creatinine, Ser: 1.36 mg/dL — ABNORMAL HIGH (ref 0.61–1.24)
GFR, Estimated: 52 mL/min — ABNORMAL LOW
Glucose, Bld: 81 mg/dL (ref 70–99)
Potassium: 4.6 mmol/L (ref 3.5–5.1)
Sodium: 140 mmol/L (ref 135–145)

## 2024-05-12 LAB — CBC
HCT: 43.6 % (ref 39.0–52.0)
Hemoglobin: 14.2 g/dL (ref 13.0–17.0)
MCH: 33.3 pg (ref 26.0–34.0)
MCHC: 32.6 g/dL (ref 30.0–36.0)
MCV: 102.3 fL — ABNORMAL HIGH (ref 80.0–100.0)
Platelets: 177 K/uL (ref 150–400)
RBC: 4.26 MIL/uL (ref 4.22–5.81)
RDW: 12.9 % (ref 11.5–15.5)
WBC: 5.8 K/uL (ref 4.0–10.5)
nRBC: 0 % (ref 0.0–0.2)

## 2024-05-12 NOTE — Telephone Encounter (Signed)
"  ° °  Patient Name: Ryan Allison  DOB: 17-Nov-1940 MRN: [id number]  Primary Cardiologist: Evalene Lunger, MD  Chart reviewed as part of pre-operative protocol coverage. Given past medical history and time since last visit, based on ACC/AHA guidelines, KIPLING GRASER is at acceptable risk for planned procedure without further cardiovascular testing.    Per Dr.Gollan 05/11/2024 Moderate but acceptable risk for urologic procedure Okay to hold Eliquis  3 days prior to procedure No further cardiac testing needed Appears to have preprocedure EKG scheduled   The patient was advised that if he develops new symptoms prior to surgery to contact our office to arrange for a follow-up visit, and he verbalized understanding.  I will route this recommendation to the requesting party via Epic fax function and remove from pre-op pool.  Please call with questions.  Lamarr Satterfield, NP 05/12/2024, 7:32 AM  "

## 2024-05-12 NOTE — Telephone Encounter (Signed)
 Received Surgical Clearance from Cardiology. Patient is to hold Eliquis  for 3 days prior to surgery. Last dose is on 05/12/2024. Patient Advised.

## 2024-05-12 NOTE — Anesthesia Preprocedure Evaluation (Signed)
 "                                  Anesthesia Evaluation  Patient identified by MRN, date of birth, ID band Patient awake    Reviewed: Allergy & Precautions, H&P , NPO status , Patient's Chart, lab work & pertinent test results  Airway Mallampati: III  TM Distance: >3 FB Neck ROM: full    Dental  (+) Implants Upper bridge:   Pulmonary sleep apnea , former smoker   Pulmonary exam normal        Cardiovascular Exercise Tolerance: Poor hypertension, +CHF (HFpEF)  + dysrhythmias Atrial Fibrillation  Rate:Normal  09/14/2022: EF 60-65%, mod basal-septal LVH, mild MR/AR, mod TR, AoV sclerosis, Ao root 37 mm   Neuro/Psych Lumbar disc disease and osteoarthritis with neck pain and muscle spasm CVA (MRI brain 02/15/2023: acute RIGHT cerebellar hemishpere infarct), No Residual Symptoms  negative psych ROS   GI/Hepatic Neg liver ROS,GERD  ,,S/p ostomy   Endo/Other  negative endocrine ROS    Renal/GU CRFRenal disease     Musculoskeletal  (+) Arthritis ,    Abdominal Normal abdominal exam  (+)   Peds  Hematology negative hematology ROS (+)   Anesthesia Other Findings Past Medical History: No date: (HFpEF) heart failure with preserved ejection fraction (HCC) No date: Amblyopia of right eye No date: Anxiety No date: Aortic atherosclerosis No date: Arthritis 05/2018: Atrial fibrillation and flutter (HCC)     Comment:  a.) CHA2DS2VASc = 7 (age x 2, HTN, HFpEF, CVA x2,               vascular disease history) as of 05/07/2024; b.)               rate/rhythm maintained on oral metoprolol  tartrate;               chronically anticoagulated with dose reduced apixaban                (experienced hematuria on rivaroxaban ) No date: B12 deficiency No date: Bladder tumor No date: Cerebral cavernoma (pontine) No date: Cervical spondylosis with myelopathy No date: Chronic kidney disease, stage 3 (HCC) No date: DDD (degenerative disc disease), lumbar No date: Depression No  date: Diverticulosis No date: Esophageal dysphagia No date: External hemorrhoids No date: GERD (gastroesophageal reflux disease) No date: GI bleed 12/2022: Hammertoe of left foot No date: History of echocardiogram     Comment:  a.) TTE 06/07/2018: EF 60-65%, no RWMAs, mild MR, PASP               33 mmHg; b.) TTE 09/19/2019: EF 50-55%, mild LVH, mod               RVE, mild LA dil, mild-mod TR, mild MR, PASP 42.5; c.)               TTE 09/14/2022: EF 60-65%, mod basal-septal LVH, mild               MR/AR, mod TR, AoV sclerosis, Ao root 37 mm No date: History of GI diverticular bleed     Comment:  a.) required prolonged ICU admission at [facility] No date: History of kidney stones No date: HLD (hyperlipidemia) No date: Hyperlipidemia No date: Hypertension No date: Kyphoscoliosis No date: Meniscus tear No date: On apixaban  therapy No date: OSA (obstructive sleep apnea)     Comment:  a.) does not require nocturnal PAP  therapy No date: Osteopenia No date: Peripheral neuropathy 2011: Prostate cancer (HCC) No date: PTSD (post-traumatic stress disorder) No date: RBBB (right bundle branch block) No date: SBO (small bowel obstruction) (HCC) No date: Skin cancer of scalp 02/15/2023: Stroke [facility])     Comment:  a.) MRI brain 02/15/2023: acute RIGHT cerebellar               hemishpere infarct  Past Surgical History: 07/12/2018: COLECTOMY     Comment:  with end ileostomy 06/07/2018: COLONOSCOPY; Left     Comment:  Procedure: COLONOSCOPY;  Surgeon: Janalyn Keene NOVAK,               MD;  Location: [facility] ENDOSCOPY;  Service: Endoscopy;                Laterality: Left; 09/12/2010: COLONOSCOPY WITH ESOPHAGOGASTRODUODENOSCOPY (EGD) 04/06/2015: COLONOSCOPY WITH PROPOFOL ; N/A     Comment:  Procedure: COLONOSCOPY WITH PROPOFOL ;  Surgeon: Gladis RAYMOND Mariner, MD;  Location: [facility] ENDOSCOPY;  Service:               Endoscopy;  Laterality: N/A; 06/07/2018: COLONOSCOPY WITH PROPOFOL ; N/A      Comment:  Procedure: COLONOSCOPY WITH PROPOFOL ;  Surgeon:               Janalyn Keene NOVAK, MD;  Location: [facility] ENDOSCOPY;                Service: Endoscopy;  Laterality: N/A; 01/05/2023: HAMMER TOE SURGERY; Left     Comment:  Procedure: HAMMER TOE CORRECTION; INJECTION OF FAT PAD               GRAFT;  Surgeon: Silva Juliene SAUNDERS, DPM;  Location: [facility]               ORS;  Service: Podiatry;  Laterality: Left; 09/23/2014: INGUINAL HERNIA REPAIR; Left     Comment:  Procedure: HERNIA REPAIR INGUINAL ADULT;  Surgeon:               Louanne KANDICE Muse, MD;  Location: [facility] ORS;  Service:              General;  Laterality: Left; 2000: LASIK 05/15/2006: MENISCUS REPAIR; Left No date: MOHS SURGERY     Comment:  right scalp 1966: SKIN GRAFT FULL THICKNESS ARM; Right     Comment:  [place] 07/17/2016: STRABISMUS SURGERY; Right 05/15/2013: VASCULAR SURGERY     Comment:  s/p GI Bleed     Reproductive/Obstetrics negative OB ROS                              Anesthesia Physical Anesthesia Plan  ASA: 3  Anesthesia Plan: General   Post-op Pain Management: Celebrex PO (pre-op)* and Tylenol  PO (pre-op)*   Induction: Intravenous  PONV Risk Score and Plan: 2 and Dexamethasone  and Ondansetron   Airway Management Planned: LMA and Oral ETT  Additional Equipment:   Intra-op Plan:   Post-operative Plan: Extubation in OR  Informed Consent: I have reviewed the patients History and Physical, chart, labs and discussed the procedure including the risks, benefits and alternatives for the proposed anesthesia with the patient or authorized representative who has indicated his/her understanding and acceptance.     Dental Advisory Given  Plan Discussed with: Anesthesiologist, CRNA and Surgeon  Anesthesia Plan Comments:  Anesthesia Quick Evaluation  "

## 2024-05-15 MED ORDER — ORAL CARE MOUTH RINSE: 15.0000 mL | Freq: Once | OROMUCOSAL | Status: AC

## 2024-05-15 MED ORDER — SODIUM CHLORIDE 0.9 % IV SOLN: INTRAVENOUS | Status: DC

## 2024-05-15 MED ORDER — CHLORHEXIDINE GLUCONATE 0.12 % MT SOLN
15.0000 mL | Freq: Once | OROMUCOSAL | Status: AC
  Administered 2024-05-16: 15 mL via OROMUCOSAL

## 2024-05-15 MED ORDER — SODIUM CHLORIDE 0.9 % IV SOLN
1.0000 g | INTRAVENOUS | Status: AC
  Administered 2024-05-16: 1 g via INTRAVENOUS
  Filled 2024-05-15: qty 1

## 2024-05-15 MED ORDER — ACETAMINOPHEN 500 MG PO TABS
1000.0000 mg | ORAL_TABLET | Freq: Once | ORAL | Status: AC
  Administered 2024-05-16: 1000 mg via ORAL

## 2024-05-16 ENCOUNTER — Ambulatory Visit
Admission: RE | Admit: 2024-05-16 | Discharge: 2024-05-16 | Disposition: A | Discharge status: Home / Self Care | Attending: Urology | Admitting: Urology

## 2024-05-16 ENCOUNTER — Ambulatory Visit

## 2024-05-16 ENCOUNTER — Ambulatory Visit: Payer: Self-pay | Admitting: Urgent Care

## 2024-05-16 ENCOUNTER — Other Ambulatory Visit: Payer: Self-pay

## 2024-05-16 ENCOUNTER — Encounter: Payer: Self-pay | Admitting: Urology

## 2024-05-16 ENCOUNTER — Encounter
Admission: RE | Disposition: A | Discharge status: Home / Self Care | Payer: Self-pay | Source: Home / Self Care | Attending: Urology

## 2024-05-16 DIAGNOSIS — N183 Chronic kidney disease, stage 3 unspecified: Secondary | ICD-10-CM | POA: Insufficient documentation

## 2024-05-16 DIAGNOSIS — I130 Hypertensive heart and chronic kidney disease with heart failure and stage 1 through stage 4 chronic kidney disease, or unspecified chronic kidney disease: Secondary | ICD-10-CM | POA: Diagnosis not present

## 2024-05-16 DIAGNOSIS — N029 Recurrent and persistent hematuria with unspecified morphologic changes: Secondary | ICD-10-CM | POA: Diagnosis present

## 2024-05-16 DIAGNOSIS — G4733 Obstructive sleep apnea (adult) (pediatric): Secondary | ICD-10-CM | POA: Insufficient documentation

## 2024-05-16 DIAGNOSIS — M1990 Unspecified osteoarthritis, unspecified site: Secondary | ICD-10-CM | POA: Insufficient documentation

## 2024-05-16 DIAGNOSIS — Z7901 Long term (current) use of anticoagulants: Secondary | ICD-10-CM | POA: Insufficient documentation

## 2024-05-16 DIAGNOSIS — Z87891 Personal history of nicotine dependence: Secondary | ICD-10-CM | POA: Diagnosis not present

## 2024-05-16 DIAGNOSIS — K219 Gastro-esophageal reflux disease without esophagitis: Secondary | ICD-10-CM | POA: Diagnosis not present

## 2024-05-16 DIAGNOSIS — R310 Gross hematuria: Secondary | ICD-10-CM | POA: Insufficient documentation

## 2024-05-16 DIAGNOSIS — I4891 Unspecified atrial fibrillation: Secondary | ICD-10-CM | POA: Diagnosis not present

## 2024-05-16 DIAGNOSIS — I5032 Chronic diastolic (congestive) heart failure: Secondary | ICD-10-CM | POA: Insufficient documentation

## 2024-05-16 DIAGNOSIS — Z8546 Personal history of malignant neoplasm of prostate: Secondary | ICD-10-CM | POA: Insufficient documentation

## 2024-05-16 HISTORY — DX: Unspecified diastolic (congestive) heart failure: I50.30

## 2024-05-16 HISTORY — PX: CYSTOSCOPY WITH BIOPSY: SHX5122

## 2024-05-16 HISTORY — DX: Other malformations of cerebral vessels: Q28.3

## 2024-05-16 HISTORY — PX: CYSTOSCOPY WITH FULGERATION: SHX6638

## 2024-05-16 HISTORY — PX: CYSTOSCOPY W/ RETROGRADES: SHX1426

## 2024-05-16 HISTORY — DX: Neoplasm of unspecified behavior of bladder: D49.4

## 2024-05-16 HISTORY — DX: Depression, unspecified: F32.A

## 2024-05-16 SURGERY — CYSTOSCOPY, WITH RETROGRADE PYELOGRAM
Anesthesia: General | Site: Bladder

## 2024-05-16 MED ORDER — GLYCOPYRROLATE 0.2 MG/ML IJ SOLN
INTRAMUSCULAR | Status: AC
  Filled 2024-05-16: qty 1

## 2024-05-16 MED ORDER — LIDOCAINE HCL (CARDIAC) PF 100 MG/5ML IV SOSY
PREFILLED_SYRINGE | INTRAVENOUS | Status: DC | PRN
  Administered 2024-05-16: 100 mg via INTRAVENOUS

## 2024-05-16 MED ORDER — OXYCODONE HCL 5 MG/5ML PO SOLN: 5.0000 mg | Freq: Once | ORAL | Status: DC | PRN

## 2024-05-16 MED ORDER — PROPOFOL 10 MG/ML IV BOLUS
INTRAVENOUS | Status: AC
  Filled 2024-05-16: qty 20

## 2024-05-16 MED ORDER — OXYCODONE HCL 5 MG PO TABS: 5.0000 mg | ORAL_TABLET | Freq: Once | ORAL | Status: DC | PRN

## 2024-05-16 MED ORDER — DEXAMETHASONE SOD PHOSPHATE PF 10 MG/ML IJ SOLN
INTRAMUSCULAR | Status: DC | PRN
  Administered 2024-05-16: 5 mg via INTRAVENOUS

## 2024-05-16 MED ORDER — FENTANYL CITRATE (PF) 100 MCG/2ML IJ SOLN
INTRAMUSCULAR | Status: AC
  Filled 2024-05-16: qty 2

## 2024-05-16 MED ORDER — ACETAMINOPHEN 500 MG PO TABS
ORAL_TABLET | ORAL | Status: AC
  Filled 2024-05-16: qty 2

## 2024-05-16 MED ORDER — STERILE WATER FOR IRRIGATION IR SOLN
Status: DC | PRN
  Administered 2024-05-16: 500 mL

## 2024-05-16 MED ORDER — CHLORHEXIDINE GLUCONATE 0.12 % MT SOLN
OROMUCOSAL | Status: AC
  Filled 2024-05-16: qty 15

## 2024-05-16 MED ORDER — DROPERIDOL 2.5 MG/ML IJ SOLN: 0.6250 mg | Freq: Once | INTRAMUSCULAR | Status: DC | PRN

## 2024-05-16 MED ORDER — PROPOFOL 10 MG/ML IV BOLUS
INTRAVENOUS | Status: DC | PRN
  Administered 2024-05-16: 100 mg via INTRAVENOUS

## 2024-05-16 MED ORDER — SODIUM CHLORIDE 0.9 % IR SOLN
Status: DC | PRN
  Administered 2024-05-16: 3000 mL

## 2024-05-16 MED ORDER — IOHEXOL 180 MG/ML  SOLN
INTRAMUSCULAR | Status: DC | PRN
  Administered 2024-05-16: 10 mL
  Administered 2024-05-16: 20 mL
  Administered 2024-05-16: 10 mL

## 2024-05-16 MED ORDER — ONDANSETRON HCL 4 MG/2ML IJ SOLN
INTRAMUSCULAR | Status: AC
  Filled 2024-05-16: qty 2

## 2024-05-16 MED ORDER — LIDOCAINE HCL (PF) 2 % IJ SOLN
INTRAMUSCULAR | Status: AC
  Filled 2024-05-16: qty 5

## 2024-05-16 MED ORDER — ACETAMINOPHEN 10 MG/ML IV SOLN: 1000.0000 mg | Freq: Once | INTRAVENOUS | Status: DC | PRN

## 2024-05-16 MED ORDER — FENTANYL CITRATE (PF) 100 MCG/2ML IJ SOLN: 25.0000 ug | INTRAMUSCULAR | Status: DC | PRN

## 2024-05-16 MED ORDER — FENTANYL CITRATE (PF) 100 MCG/2ML IJ SOLN
INTRAMUSCULAR | Status: DC | PRN
  Administered 2024-05-16: 25 ug via INTRAVENOUS

## 2024-05-16 MED ORDER — ONDANSETRON HCL 4 MG/2ML IJ SOLN
INTRAMUSCULAR | Status: DC | PRN
  Administered 2024-05-16: 4 mg via INTRAVENOUS

## 2024-05-16 SURGICAL SUPPLY — 28 items
BAG DRAIN SIEMENS DORNER NS (MISCELLANEOUS) ×3 IMPLANT
BAG URINE DRAIN 2000ML AR STRL (UROLOGICAL SUPPLIES) ×3 IMPLANT
BRUSH SCRUB EZ 1% IODOPHOR (MISCELLANEOUS) ×3 IMPLANT
BRUSH SCRUB EZ 4% CHG (MISCELLANEOUS) ×3 IMPLANT
CATH FOL 2WAY LX 18X30 (CATHETERS) ×3 IMPLANT
CATH URETL OPEN END 6X70 (CATHETERS) ×3 IMPLANT
DRAPE UTILITY 15X26 TOWEL STRL (DRAPES) ×3 IMPLANT
DRSG TELFA 3X4 N-ADH STERILE (GAUZE/BANDAGES/DRESSINGS) ×3 IMPLANT
ELECTRODE LOOP 22F BIPOLAR SML (ELECTROSURGICAL) IMPLANT
ELECTRODE REM PT RTRN 9FT ADLT (ELECTROSURGICAL) ×3 IMPLANT
GLOVE BIOGEL PI IND STRL 7.5 (GLOVE) ×3 IMPLANT
GOWN STRL REUS W/ TWL LRG LVL3 (GOWN DISPOSABLE) ×6 IMPLANT
GOWN STRL REUS W/ TWL XL LVL3 (GOWN DISPOSABLE) ×3 IMPLANT
GOWN STRL REUS W/TWL XL LVL4 (GOWN DISPOSABLE) ×3 IMPLANT
GUIDEWIRE STR DUAL SENSOR (WIRE) ×3 IMPLANT
KIT TURNOVER CYSTO (KITS) ×3 IMPLANT
LOOP CUT BIPOLAR 24F LRG (ELECTROSURGICAL) IMPLANT
NDL SAFETY ECLIP 18X1.5 (MISCELLANEOUS) ×3 IMPLANT
PACK CYSTO AR (MISCELLANEOUS) ×3 IMPLANT
SET CYSTO IRRIGATION (SET/KITS/TRAYS/PACK) ×3 IMPLANT
SET IRRIG Y-TYPE CYSTO (SET/KITS/TRAYS/PACK) ×3 IMPLANT
SOL .9 NS 3000ML IRR UROMATIC (IV SOLUTION) ×6 IMPLANT
SOLN STERILE WATER 500 ML (IV SOLUTION) ×3 IMPLANT
SOLN STERILE WATER BTL 1000 ML (IV SOLUTION) ×3 IMPLANT
SURGILUBE 2OZ TUBE FLIPTOP (MISCELLANEOUS) ×3 IMPLANT
SYR 10ML LL (SYRINGE) ×3 IMPLANT
SYRINGE TOOMEY IRRIG 70ML (MISCELLANEOUS) ×3 IMPLANT
WATER STERILE IRR 3000ML UROMA (IV SOLUTION) ×3 IMPLANT

## 2024-05-16 NOTE — Anesthesia Postprocedure Evaluation (Signed)
"   Anesthesia Post Note  Patient: Ryan Allison  Procedure(s) Performed: CYSTOSCOPY, WITH RETROGRADE PYELOGRAM (Bilateral) CYSTOSCOPY, WITH BIOPSY CYSTOSCOPY, WITH BLADDER FULGURATION (Bladder)  Patient location during evaluation: PACU Anesthesia Type: General Level of consciousness: awake and alert Pain management: pain level controlled Vital Signs Assessment: post-procedure vital signs reviewed and stable Respiratory status: spontaneous breathing, nonlabored ventilation and respiratory function stable Cardiovascular status: blood pressure returned to baseline and stable Postop Assessment: no apparent nausea or vomiting Anesthetic complications: no   No notable events documented.   Last Vitals:  Vitals:   05/16/24 1145 05/16/24 1211  BP: (!) 151/70 (!) 162/72  Pulse: (!) 59 63  Resp: 12 15  Temp:  36.7 C  SpO2: 100% 99%    Last Pain:  Vitals:   05/16/24 1211  TempSrc: Temporal  PainSc:                  Ryan Allison      "

## 2024-05-16 NOTE — Transfer of Care (Signed)
 Immediate Anesthesia Transfer of Care Note  Patient: Ryan Allison  Procedure(s) Performed: CYSTOSCOPY, WITH RETROGRADE PYELOGRAM (Bilateral) CYSTOSCOPY, WITH BIOPSY CYSTOSCOPY, WITH BLADDER FULGURATION (Bladder)  Patient Location: PACU  Anesthesia Type:General  Level of Consciousness: drowsy  Airway & Oxygen Therapy: Patient Spontanous Breathing and Patient connected to face mask oxygen  Post-op Assessment: Report given to RN and Post -op Vital signs reviewed and stable  Post vital signs: Reviewed and stable  Last Vitals:  Vitals Value Taken Time  BP 139/70   Temp    Pulse 114 05/16/24 11:19  Resp 16   SpO2 100 % 05/16/24 11:19  Vitals shown include unfiled device data.  Last Pain:  Vitals:   05/16/24 0819  PainSc: 0-No pain         Complications: No notable events documented.

## 2024-05-16 NOTE — Discharge Instructions (Addendum)
 Bladder Biopsy   Definition:  Transurethral Resection of the Bladder Tumor is a surgical procedure used to diagnose and remove tumors within the bladder. T  General instructions:     Your recent bladder surgery requires very little post hospital care but some definite precautions.  Despite the fact that no skin incisions were used, the area around the bladder incisions are raw and covered with scabs to promote healing and prevent bleeding. Certain precautions are needed to insure that the scabs are not disturbed over the next 2-4 weeks while the healing proceeds.  Because the raw surface inside your bladder and the irritating effects of urine you may expect frequency of urination and/or urgency (a stronger desire to urinate) and perhaps even getting up at night more often. This will usually resolve or improve slowly over the healing period. You may see some blood in your urine over the first 6 weeks. Do not be alarmed, even if the urine was clear for a while. Get off your feet and drink lots of fluids until clearing occurs. If you start to pass clots or don't improve call us .  Diet:  You may return to your normal diet immediately. Because of the raw surface of your bladder, alcohol, spicy foods, foods high in acid and drinks with caffeine may cause irritation or frequency and should be used in moderation. To keep your urine flowing freely and avoid constipation, drink plenty of fluids during the day (8-10 glasses). Tip: Avoid cranberry juice because it is very acidic.  Activity:  Your physical activity doesn't need to be restricted. However, if you are very active, you may see some blood in the urine. We suggest that you reduce your activity under the circumstances until the bleeding has stopped.  Bowels:  It is important to keep your bowels regular during the postoperative period. Straining with bowel movements can cause bleeding. A bowel movement every other day is reasonable. Use a mild  laxative if needed, such as milk of magnesia 2-3 tablespoons, or 2 Dulcolax tablets. Call if you continue to have problems. If you had been taking narcotics for pain, before, during or after your surgery, you may be constipated. Take a laxative if necessary.    Medication:  You should resume your pre-surgery medications unless told not to. In addition you may be given an antibiotic to prevent or treat infection. Antibiotics are not always necessary. All medication should be taken as prescribed until the bottles are finished unless you are having an unusual reaction to one of the drugs.   [facility] [address] [place], [place] [address] [phone]

## 2024-05-16 NOTE — Anesthesia Procedure Notes (Addendum)
 Procedure Name: LMA Insertion Date/Time: 05/16/2024 10:34 AM  Performed by: Paiton Boultinghouse, CRNAPre-anesthesia Checklist: Patient identified, Emergency Drugs available, Suction available and Patient being monitored Patient Re-evaluated:Patient Re-evaluated prior to induction Oxygen Delivery Method: Circle System Utilized Preoxygenation: Pre-oxygenation with 100% oxygen Induction Type: IV induction Ventilation: Mask ventilation without difficulty LMA: LMA inserted LMA Size: 4.0 Number of attempts: 1 Airway Equipment and Method: Bite block Placement Confirmation: positive ETCO2 Tube secured with: Tape Dental Injury: Teeth and Oropharynx as per pre-operative assessment  Comments: Teeth, lips and tongue unchanged. Head and neck midline. Extra pillow under head and neck to support.

## 2024-05-16 NOTE — Op Note (Signed)
" ° °  Preoperative diagnosis:  Recurrent gross hematuria  Postoperative diagnosis:  Recurrent gross hematuria  Procedure: Cystoscopy Bladder biopsy/fulguration Bilateral retrograde pyelogram  Surgeon: Ryan JAYSON Barba, MD  Anesthesia: General  Complications: None  Intraoperative findings:  Wide caliber bulbar urethral stricture Nonocclusive prostate with moderate bladder neck elevation.  Prominent hypervascularity prostatic urethra with friability Prominent superficial hypervascularity trigone ~1 cm area mucosal erythema mid posterior wall Complete duplication with collecting system Bilateral retrograde pyelogram: Normal-appearing ureters without dilation, filling defect.  Collecting system is normal in appearance without dilation or filling defects Moderate trabeculation   EBL: Minimal  Specimens: Bladder biopsy  Indication: Ryan Allison is a 84 y.o. patient with a history of metastatic prostate cancer with prior EBRT 2011.  History of intermittent gross hematuria.  Urine cytology with atypical cells.  Refer to the H&P for details.  After reviewing the management options for treatment, he elected to proceed with the above surgical procedure(s). We have discussed the potential benefits and risks of the procedure, side effects of the proposed treatment, the likelihood of the patient achieving the goals of the procedure, and any potential problems that might occur during the procedure or recuperation. Informed consent has been obtained.  Description of procedure:  The patient was taken to the operating room and general anesthesia was induced.  The patient was placed in the dorsal lithotomy position, prepped and draped in the usual sterile fashion, and preoperative antibiotics were administered. A preoperative time-out was performed.   The urethral meatus would not except a 21 French cystoscope sheath and the distal urethra was dilated with [employer] sounds from 20-64F.  The  cystoscope was then easily inserted per urethra and advanced proximally under direct vision with findings as described above.  Attention was directed to a laterally placed ectopic UO and a 62F open-ended ureteral catheter was placed through the cystoscope and into the left UO.  10 mL of Omnipaque  contrast was instilled under fluoroscopy with findings as described above.  Identical procedures were then performed in the orthotopic left UO and right UO.  Biopsy of the area of erythema posterior wall was then performed with rigid forceps and the biopsy site was fulgurated with a Bugbee electrode  The areas of prominent hypervascularity in the trigone and prostatic urethra were also fulgurated.  At the completion of the procedure hemostasis was adequate.  The bladder was emptied and cystoscope was removed.  After anesthetic reversal he was transported to the PACU in stable condition.  Plan: He will be contacted with the pathology results May resume Eliquis  05/17/2024  Ryan Allison, M.D.  "

## 2024-05-16 NOTE — Interval H&P Note (Signed)
 History and Physical Interval Note:  05/16/2024 10:11 AM  Ryan Allison  has presented today for surgery, with the diagnosis of Recurrent Gross Hematuria.  The various methods of treatment have been discussed with the patient and family. After consideration of risks, benefits and other options for treatment, the patient has consented to  Procedures: CYSTOSCOPY (N/A) CYSTOSCOPY, WITH RETROGRADE PYELOGRAM (Bilateral) CYSTOSCOPY, WITH BIOPSY (N/A) TURBT (TRANSURETHRAL RESECTION OF BLADDER TUMOR) (N/A) as a surgical intervention.  The patient's history has been reviewed, patient examined, no change in status, stable for surgery.  I have reviewed the patient's chart and labs.  Questions were answered to the patient's satisfaction.    CV:RRR Lungs:clear  [name]

## 2024-05-19 ENCOUNTER — Encounter: Payer: Self-pay | Admitting: Urology

## 2024-05-19 LAB — SURGICAL PATHOLOGY

## 2024-05-22 ENCOUNTER — Telehealth: Payer: Self-pay | Admitting: Urology

## 2024-05-22 NOTE — Telephone Encounter (Signed)
 Left a message to let patient know we will call him with results. If he is having any post op problems to call us  back and let us  know .

## 2024-05-22 NOTE — Telephone Encounter (Signed)
 Patient called office inquiring about his surgery. He would like to know what you did and what you found during his surgery. I did let him know that I he will be getting a call hen his pathology comes in but he still had other questions.

## 2024-05-25 ENCOUNTER — Encounter: Payer: Self-pay | Admitting: Urology

## 2024-05-28 NOTE — Telephone Encounter (Signed)
 Notified patient as instructed, schedule ua appt with sam on 05/29/2024

## 2024-05-29 ENCOUNTER — Ambulatory Visit (INDEPENDENT_AMBULATORY_CARE_PROVIDER_SITE_OTHER): Admitting: Physician Assistant

## 2024-05-29 ENCOUNTER — Encounter: Payer: Self-pay | Admitting: Physician Assistant

## 2024-05-29 VITALS — BP 177/89 | HR 76 | Ht 70.0 in | Wt 167.0 lb

## 2024-05-29 DIAGNOSIS — R34 Anuria and oliguria: Secondary | ICD-10-CM | POA: Diagnosis not present

## 2024-05-29 DIAGNOSIS — R310 Gross hematuria: Secondary | ICD-10-CM

## 2024-05-29 LAB — BLADDER SCAN AMB NON-IMAGING: Scan Result: 74

## 2024-05-29 LAB — URINALYSIS, COMPLETE
Glucose, UA: NEGATIVE
Leukocytes,UA: NEGATIVE
Nitrite, UA: NEGATIVE
Specific Gravity, UA: 1.03 (ref 1.005–1.030)
Urobilinogen, Ur: 0.2 mg/dL (ref 0.2–1.0)
pH, UA: 5.5 (ref 5.0–7.5)

## 2024-05-29 LAB — MICROSCOPIC EXAMINATION: RBC, Urine: >30 /HPF — AB (ref 0–2)

## 2024-05-29 MED ORDER — FINASTERIDE 5 MG PO TABS: 5.0000 mg | ORAL_TABLET | Freq: Every day | ORAL | 11 refills | Status: AC

## 2024-05-29 NOTE — Patient Instructions (Addendum)
 Start finasteride  once daily.  You will take this long-term.  This will help shrink the blood vessels in your prostate and reduce your risk for bleeding episodes in the future. Stay off the Eliquis  for the next 1 to 2 days until the visible blood in your urine has completely resolved.  Then you may resume the Eliquis .  I will make Dr. Fernande and Dr. Gollan aware. Stay well-hydrated to dilute the urine.

## 2024-05-29 NOTE — Progress Notes (Signed)
 "  05/29/2024 1:40 PM   Ryan Allison 03-20-1941 [id number]  CC: Chief Complaint  Patient presents with   Other   HPI: Ryan Allison is a 84 y.o. male with PMH metastatic prostate cancer on intermittent therapy (currently stopped) managed by [facility] oncology, LUTS, and recurrent gross hematuria s/p cystoscopy with bladder biopsy/fulguration with Dr. Twylla 13 days ago who presents today for evaluation of gross hematuria.   Intraoperatively, he was found to have a wide caliber bulbar urethral stricture, prominent hypervascularity of the prostatic urethra with friability, prominent superficial hypervascularity of the trigone, and a 1 cm area of mucosal erythema on the mid posterior bladder wall.  Surgical pathology was benign.  Today he reports his urine was initially clear postop while he was off Ryan Allison , however he developed worsening gross hematuria after resuming the Ryan Allison .  Dr. Twylla advised him to stop Ryan Allison , and he started holding it last night.  After missing 2 doses, his urine has cleared considerably.  He is very concerned about his degree of bleeding and also his risk of cardiovascular events off Ryan Allison .  Also, he states that he has had much decreased urinary frequency since surgery.  He now voids only 1-2 times daily and is rather concerned about this.  He denies bladder pain or abdominal distention.  No change in bowel output.  In-office UA today positive for 2+ protein, 3+ blood, 2+ bilirubin, and trace ketones; urine microscopy with >30 RBCs/HPF and moderate bacteria.  PVR 74 mL.  PMH: Past Medical History:  Diagnosis Date   (HFpEF) heart failure with preserved ejection fraction (HCC)    Amblyopia of right eye    Anxiety    Aortic atherosclerosis    Arthritis    Atrial fibrillation and flutter (HCC) 05/2018   a.) CHA2DS2VASc = 7 (age x 2, HTN, HFpEF, CVA x2, vascular disease history) as of 05/07/2024; b.) rate/rhythm maintained on oral metoprolol  tartrate;  chronically anticoagulated with dose reduced apixaban  (experienced hematuria on rivaroxaban )   B12 deficiency    Bladder tumor    Cerebral cavernoma (pontine)    Cervical spondylosis with myelopathy    Chronic kidney disease, stage 3 (HCC)    DDD (degenerative disc disease), lumbar    Depression    Diverticulosis    Esophageal dysphagia    External hemorrhoids    GERD (gastroesophageal reflux disease)    GI bleed    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   History of echocardiogram    a.) TTE 06/07/2018: EF 60-65%, no RWMAs, mild MR, PASP 33 mmHg; b.) TTE 09/19/2019: EF 50-55%, mild LVH, mod RVE, mild LA dil, mild-mod TR, mild MR, PASP 42.5; c.) TTE 09/14/2022: EF 60-65%, mod basal-septal LVH, mild MR/AR, mod TR, AoV sclerosis, Ao root 37 mm   History of GI diverticular bleed    a.) required prolonged ICU admission at [facility]   History of kidney stones    HLD (hyperlipidemia)    Hyperlipidemia    Hypertension    Kyphoscoliosis    Meniscus tear    On apixaban  therapy    OSA (obstructive sleep apnea)    a.) does not require nocturnal PAP therapy   Osteopenia    Peripheral neuropathy    Prostate cancer (HCC) 2011   PTSD (post-traumatic stress disorder)    RBBB (right bundle branch block)    SBO (small bowel obstruction) (HCC)    Skin cancer of scalp    Stroke (HCC) 02/15/2023   a.) MRI brain 02/15/2023: acute RIGHT  cerebellar hemishpere infarct    Surgical History: Past Surgical History:  Procedure Laterality Date   COLECTOMY  07/12/2018   with end ileostomy   COLONOSCOPY Left 06/07/2018   Procedure: COLONOSCOPY;  Surgeon: Ryan Keene NOVAK, MD;  Location: [facility] ENDOSCOPY;  Service: Endoscopy;  Laterality: Left;   COLONOSCOPY WITH ESOPHAGOGASTRODUODENOSCOPY (EGD)  09/12/2010   COLONOSCOPY WITH PROPOFOL  N/A 04/06/2015   Procedure: COLONOSCOPY WITH PROPOFOL ;  Surgeon: Ryan RAYMOND Mariner, MD;  Location: [facility] ENDOSCOPY;  Service: Endoscopy;  Laterality: N/A;   COLONOSCOPY WITH PROPOFOL   N/A 06/07/2018   Procedure: COLONOSCOPY WITH PROPOFOL ;  Surgeon: Ryan Keene NOVAK, MD;  Location: [facility] ENDOSCOPY;  Service: Endoscopy;  Laterality: N/A;   CYSTOSCOPY W/ RETROGRADES Bilateral 05/16/2024   Procedure: CYSTOSCOPY, WITH RETROGRADE PYELOGRAM;  Surgeon: Ryan Allison Glendia BROCKS, MD;  Location: [facility] ORS;  Service: Urology;  Laterality: Bilateral;   CYSTOSCOPY WITH BIOPSY N/A 05/16/2024   Procedure: CYSTOSCOPY, WITH BIOPSY;  Surgeon: Ryan Allison Glendia BROCKS, MD;  Location: [facility] ORS;  Service: Urology;  Laterality: N/A;   CYSTOSCOPY WITH FULGERATION N/A 05/16/2024   Procedure: CYSTOSCOPY, WITH BLADDER FULGURATION;  Surgeon: Ryan Allison Glendia BROCKS, MD;  Location: [facility] ORS;  Service: Urology;  Laterality: N/A;   HAMMER TOE SURGERY Left 01/05/2023   Procedure: HAMMER TOE CORRECTION; INJECTION OF FAT PAD GRAFT;  Surgeon: Ryan Allison, DPM;  Location: [facility] ORS;  Service: Podiatry;  Laterality: Left;   INGUINAL HERNIA REPAIR Left 09/23/2014   Procedure: HERNIA REPAIR INGUINAL ADULT;  Surgeon: Ryan KANDICE Muse, MD;  Location: [facility] ORS;  Service: General;  Laterality: Left;   LASIK  2000   MENISCUS REPAIR Left 05/15/2006   MOHS SURGERY     right scalp   SKIN GRAFT FULL THICKNESS ARM Right 1966   [place]   STRABISMUS SURGERY Right 07/17/2016   VASCULAR SURGERY  05/15/2013   s/p GI Bleed    Home Medications:  Allergies as of 05/29/2024       Reactions   Mirtazapine Other (See Comments)   confusion   Nsaids Other (See Comments)   Diverticular bleed   Tolectin [tolmetin] Other (See Comments)   Diverticular bleed   Cardizem  [diltiazem  Hcl] Rash   Celebrex [celecoxib] Itching, Rash   Effexor [venlafaxine] Other (See Comments)   Sedation         Medication List        Accurate as of May 29, 2024  1:40 PM. If you have any questions, ask your nurse or doctor.          acetaminophen  325 MG tablet Commonly known as: TYLENOL  Take 1-2 tablets (325-650 mg total) by mouth every 4  (four) hours as needed for mild pain (pain score 1-3). What changed:  how much to take reasons to take this   aspirin EC 81 MG tablet Take 81 mg by mouth daily. Swallow whole.   Ryan Allison  2.5 MG Tabs tablet Generic drug: apixaban  TAKE 1 TABLET TWICE A DAY   gabapentin  600 MG tablet Commonly known as: NEURONTIN  Take 1 tablet (600 mg total) by mouth 2 (two) times daily as needed. What changed: when to take this   loperamide 2 MG capsule Commonly known as: IMODIUM Take 2 mg by mouth 2 (two) times daily.   metoprolol  tartrate 25 MG tablet Commonly known as: LOPRESSOR  Take 1 tablet (25 mg total) by mouth 2 (two) times daily.   QUEtiapine 25 MG tablet Commonly known as: SEROQUEL Take 12.5 mg by mouth at bedtime as needed (sleep).  simvastatin  20 MG tablet Commonly known as: ZOCOR  Take 20 mg by mouth at bedtime.   traMADol  50 MG tablet Commonly known as: ULTRAM  Take 50 mg by mouth every 6 (six) hours as needed for moderate pain.        Allergies:  Allergies[1]  Family History: Family History  Problem Relation Age of Onset   Dementia Mother    Cancer Father    Cancer Brother     Social History:   reports that he quit smoking about 55 years ago. His smoking use included cigarettes. He has been exposed to tobacco smoke. He has never used smokeless tobacco. He reports that he does not drink alcohol and does not use drugs.  Physical Exam: BP (!) 177/89   Pulse 76   Ht 5' 10 (1.778 m)   Wt 167 lb (75.8 kg)   BMI 23.96 kg/m   Constitutional:  Alert and oriented, no acute distress, nontoxic appearing HEENT: , AT Cardiovascular: No clubbing, cyanosis, or edema Respiratory: Normal respiratory effort, no increased work of breathing Skin: No rashes, bruises or suspicious lesions Neurologic: Grossly intact, no focal deficits, moving all 4 extremities Psychiatric: Normal mood and affect  Laboratory Data: Results for orders placed or performed in visit on 05/29/24   BLADDER SCAN AMB NON-IMAGING   Collection Time: 05/29/24  2:09 PM  Result Value Ref Range   Scan Result 74    Assessment & Plan:   1. Gross hematuria (Primary) Likely related to his postop status as well as hypervascularity/friability of the prostatic urethra and trigone.  His urine has cleared considerably after holding Ryan Allison  for 2 doses.  I advised him to stay off the Ryan Allison  until his urine clears, and then resume it.  I provided reassurance regarding the relatively low risk of cardiovascular events with holding Ryan Allison  for short periods of time.  Since he is not on continuous therapy for his prostate cancer, I am starting him on finasteride  to reduce his hypervascularity of the prostate and try to reduce his risk for bleeding episodes.  He is in agreement. - Urinalysis, Complete - finasteride  (PROSCAR ) 5 MG tablet; Take 1 tablet (5 mg total) by mouth daily.  Dispense: 30 tablet; Refill: 11  2. Decreased urination Bladder scan is appropriate.  I advised him to push some fluids especially to keep the urine dilated in the setting of hematuria. - BLADDER SCAN AMB NON-IMAGING   Return in about 6 months (around 11/26/2024) for IPSS, UA with Dr. Twylla.  Lucie Hones, PA-C  [facility] [place] [address], [address] [place], [place] [address] [phone]     [1]  Allergies Allergen Reactions   Mirtazapine Other (See Comments)    confusion   Nsaids Other (See Comments)    Diverticular bleed   Tolectin [Tolmetin] Other (See Comments)    Diverticular bleed   Cardizem  [Diltiazem  Hcl] Rash   Celebrex [Celecoxib] Itching and Rash   Effexor [Venlafaxine] Other (See Comments)    Sedation    "

## 2024-06-17 ENCOUNTER — Emergency Department
Admission: EM | Admit: 2024-06-17 | Discharge: 2024-06-17 | Disposition: A | Discharge status: Home / Self Care | Attending: Emergency Medicine | Admitting: Emergency Medicine

## 2024-06-17 ENCOUNTER — Encounter: Payer: Self-pay | Admitting: Emergency Medicine

## 2024-06-17 ENCOUNTER — Other Ambulatory Visit: Payer: Self-pay

## 2024-06-17 ENCOUNTER — Emergency Department

## 2024-06-17 DIAGNOSIS — T698XXA Other specified effects of reduced temperature, initial encounter: Secondary | ICD-10-CM | POA: Insufficient documentation

## 2024-06-17 DIAGNOSIS — T699XXA Effect of reduced temperature, unspecified, initial encounter: Secondary | ICD-10-CM

## 2024-06-17 DIAGNOSIS — X31XXXA Exposure to excessive natural cold, initial encounter: Secondary | ICD-10-CM | POA: Insufficient documentation

## 2024-06-17 LAB — COMPREHENSIVE METABOLIC PANEL WITH GFR
ALT: 10 U/L (ref 0–44)
AST: 25 U/L (ref 15–41)
Albumin: 4.1 g/dL (ref 3.5–5.0)
Alkaline Phosphatase: 115 U/L (ref 38–126)
Anion gap: 19 — ABNORMAL HIGH (ref 5–15)
BUN: 29 mg/dL — ABNORMAL HIGH (ref 8–23)
CO2: 24 mmol/L (ref 22–32)
Calcium: 9.6 mg/dL (ref 8.9–10.3)
Chloride: 97 mmol/L — ABNORMAL LOW (ref 98–111)
Creatinine, Ser: 0.94 mg/dL (ref 0.61–1.24)
GFR, Estimated: >60 mL/min
Glucose, Bld: 86 mg/dL (ref 70–99)
Potassium: 3.9 mmol/L (ref 3.5–5.1)
Sodium: 140 mmol/L (ref 135–145)
Total Bilirubin: 1.5 mg/dL — ABNORMAL HIGH (ref 0.0–1.2)
Total Protein: 8.3 g/dL — ABNORMAL HIGH (ref 6.5–8.1)

## 2024-06-17 LAB — COOXEMETRY PANEL
Carboxyhemoglobin: 2.1 % — ABNORMAL HIGH (ref 0.5–1.5)
Methemoglobin: <0.7 % (ref 0.0–1.5)
O2 Saturation: 66.9 %
Total hemoglobin: 15.4 g/dL (ref 12.0–16.0)
Total oxygen content: 65.2 %

## 2024-06-17 LAB — CBC WITH DIFFERENTIAL/PLATELET
Abs Immature Granulocytes: 0.02 10*3/uL (ref 0.00–0.07)
Basophils Absolute: 0 10*3/uL (ref 0.0–0.1)
Basophils Relative: 0 %
Eosinophils Absolute: 0 10*3/uL (ref 0.0–0.5)
Eosinophils Relative: 0 %
HCT: 43.8 % (ref 39.0–52.0)
Hemoglobin: 14.9 g/dL (ref 13.0–17.0)
Immature Granulocytes: 0 %
Lymphocytes Relative: 10 %
Lymphs Abs: 0.7 10*3/uL (ref 0.7–4.0)
MCH: 33.2 pg (ref 26.0–34.0)
MCHC: 34 g/dL (ref 30.0–36.0)
MCV: 97.6 fL (ref 80.0–100.0)
Monocytes Absolute: 0.6 10*3/uL (ref 0.1–1.0)
Monocytes Relative: 9 %
Neutro Abs: 5.7 10*3/uL (ref 1.7–7.7)
Neutrophils Relative %: 81 %
Platelets: 237 10*3/uL (ref 150–400)
RBC: 4.49 MIL/uL (ref 4.22–5.81)
RDW: 12.6 % (ref 11.5–15.5)
WBC: 7.1 10*3/uL (ref 4.0–10.5)
nRBC: 0 % (ref 0.0–0.2)

## 2024-06-17 LAB — RESP PANEL BY RT-PCR (RSV, FLU A&B, COVID)  RVPGX2
Influenza A by PCR: NEGATIVE
Influenza B by PCR: NEGATIVE
Resp Syncytial Virus by PCR: NEGATIVE
SARS Coronavirus 2 by RT PCR: NEGATIVE

## 2024-06-17 LAB — BLOOD GAS, VENOUS

## 2024-06-17 LAB — TSH: TSH: 1.69 u[IU]/mL (ref 0.350–4.500)

## 2024-06-17 LAB — LACTIC ACID, PLASMA: Lactic Acid, Venous: 1.5 mmol/L (ref 0.5–1.9)

## 2024-06-17 LAB — TROPONIN T, HIGH SENSITIVITY: Troponin T High Sensitivity: 31 ng/L — ABNORMAL HIGH (ref 0–19)

## 2024-06-17 LAB — T4, FREE: Free T4: 1.29 ng/dL (ref 0.80–2.00)

## 2024-06-17 MED ORDER — METOPROLOL TARTRATE 25 MG PO TABS
25.0000 mg | ORAL_TABLET | Freq: Once | ORAL | Status: AC
  Administered 2024-06-17: 25 mg via ORAL
  Filled 2024-06-17: qty 1

## 2024-06-17 MED ORDER — SODIUM CHLORIDE 0.9 % IV BOLUS
1000.0000 mL | Freq: Once | INTRAVENOUS | Status: AC
  Administered 2024-06-17: 1000 mL via INTRAVENOUS

## 2024-06-17 NOTE — Discharge Instructions (Signed)
 Please continue to take your regular medications.  Please call your primary care physician tomorrow to help determine whether you qualify for home health aides.  I have consulted our social work as well.  Please have a low threshold to return if you have any acutely worsening symptoms. -- RETURN PRECAUTIONS & AFTERCARE: (ENGLISH) RETURN PRECAUTIONS: Return immediately to the emergency department or see/call your doctor if you feel worse, weak or have changes in speech or vision, are short of breath, have fever, vomiting, pain, bleeding or dark stool, trouble urinating or any new issues. Return here or see/call your doctor if not improving as expected for your suspected condition. FOLLOW-UP CARE: Call your doctor and/or any doctors we referred you to for more advice and to make an appointment. Do this today, tomorrow or after the weekend. Some doctors only take PPO insurance so if you have HMO insurance you may want to contact your HMO or your regular doctor for referral to a specialist within your plan. Either way tell the doctor's office that it was a referral from the emergency department so you get the soonest possible appointment.  YOUR TEST RESULTS: Take result reports of any blood or urine tests, imaging tests and EKG's to your doctor and any referral doctor. Have any abnormal tests repeated. Your doctor or a referral doctor can let you know when this should be done. Also make sure your doctor contacts this hospital to get any test results that are not currently available such as cultures or special tests for infection and final imaging reports, which are often not available at the time you leave the ER but which may list additional important findings that are not documented on the preliminary report. BLOOD PRESSURE: If your blood pressure was greater than 120/80 have your blood pressure rechecked within 1 to 2 weeks. MEDICATION SIDE EFFECTS: Do not drive, walk, bike, take the bus, etc. if you have  received or are being prescribed any sedating medications such as those for pain or anxiety or certain antihistamines like Benadryl. If you have been give one of these here get a taxi home or have a friend drive you home. Ask your pharmacist to counsel you on potential side effects of any new medication

## 2024-06-17 NOTE — ED Triage Notes (Signed)
 Pt to ED via [facility] from home. Pt's son who lives out of state called EMS to check on pt. Pt was found in house with a temp of 52 degrees, unknown how long it has been that cold in the house. Per neighbors pt has not responded to text messages since Saturday. Pt lives alone. Pt temp with EMS was 94.4, pt was able to ambulate with assistance. HR 84 Pt has colostomy bag.

## 2024-06-17 NOTE — ED Notes (Signed)
 Pt attempted to give UA sample. Pt states he had urinated just a bit ago but urine was disposed of by family. Pt ambulated with this RN around room. Pt denies any dizziness. Pt ambulated with steady gait with minimal assist. Dr Nicholaus notified.

## 2024-06-17 NOTE — ED Notes (Signed)
 Pt given DC instructions. Pt verbalized understanding of follow up care. Pt taken from ED in wheelchair by this RN.

## 2024-06-18 LAB — BLOOD GAS, VENOUS
Bicarbonate: 29.9 mmol/L — ABNORMAL HIGH (ref 20.0–28.0)
O2 Saturation: 49.6 mmol/L — AB (ref 0.0–2.0)
Patient temperature: 37
Patient temperature: 49.6
pCO2, Ven: 44 mmHg (ref 44–60)
pH, Ven: 7.44 — ABNORMAL HIGH (ref 7.25–7.43)
pO2, Ven: <31 mmol/L — CL (ref 32–45)

## 2024-06-20 LAB — CULTURE, BLOOD (ROUTINE X 2)
Culture: NO GROWTH
Culture: NO GROWTH
Special Requests: ADEQUATE

## 2024-11-26 ENCOUNTER — Ambulatory Visit: Admitting: Urology
